# Patient Record
Sex: Male | Born: 1945 | Race: White | Hispanic: No | Marital: Married | Smoking: Former smoker
Health system: Southern US, Community
[De-identification: ages and names within clinical notes are randomized; demographics above are authoritative.]

## PROBLEM LIST (undated history)

## (undated) DIAGNOSIS — N200 Calculus of kidney: Secondary | ICD-10-CM

## (undated) DIAGNOSIS — J189 Pneumonia, unspecified organism: Secondary | ICD-10-CM

## (undated) DIAGNOSIS — I209 Angina pectoris, unspecified: Secondary | ICD-10-CM

## (undated) DIAGNOSIS — M069 Rheumatoid arthritis, unspecified: Secondary | ICD-10-CM

## (undated) DIAGNOSIS — K219 Gastro-esophageal reflux disease without esophagitis: Secondary | ICD-10-CM

## (undated) DIAGNOSIS — I219 Acute myocardial infarction, unspecified: Secondary | ICD-10-CM

## (undated) DIAGNOSIS — R011 Cardiac murmur, unspecified: Secondary | ICD-10-CM

## (undated) DIAGNOSIS — B376 Candidal endocarditis: Secondary | ICD-10-CM

## (undated) DIAGNOSIS — I82409 Acute embolism and thrombosis of unspecified deep veins of unspecified lower extremity: Secondary | ICD-10-CM

## (undated) DIAGNOSIS — E785 Hyperlipidemia, unspecified: Secondary | ICD-10-CM

## (undated) DIAGNOSIS — D649 Anemia, unspecified: Secondary | ICD-10-CM

## (undated) DIAGNOSIS — T300 Burn of unspecified body region, unspecified degree: Secondary | ICD-10-CM

## (undated) DIAGNOSIS — Z950 Presence of cardiac pacemaker: Secondary | ICD-10-CM

## (undated) DIAGNOSIS — I2699 Other pulmonary embolism without acute cor pulmonale: Secondary | ICD-10-CM

## (undated) DIAGNOSIS — J42 Unspecified chronic bronchitis: Secondary | ICD-10-CM

## (undated) DIAGNOSIS — I2510 Atherosclerotic heart disease of native coronary artery without angina pectoris: Secondary | ICD-10-CM

## (undated) DIAGNOSIS — I10 Essential (primary) hypertension: Secondary | ICD-10-CM

## (undated) DIAGNOSIS — K222 Esophageal obstruction: Secondary | ICD-10-CM

## (undated) DIAGNOSIS — K644 Residual hemorrhoidal skin tags: Secondary | ICD-10-CM

## (undated) DIAGNOSIS — I447 Left bundle-branch block, unspecified: Secondary | ICD-10-CM

## (undated) DIAGNOSIS — I509 Heart failure, unspecified: Secondary | ICD-10-CM

## (undated) HISTORY — PX: ESOPHAGEAL DILATION: SHX303

## (undated) HISTORY — DX: Heart failure, unspecified: I50.9

## (undated) HISTORY — DX: Gastro-esophageal reflux disease without esophagitis: K21.9

## (undated) HISTORY — DX: Left bundle-branch block, unspecified: I44.7

## (undated) HISTORY — DX: Cardiac murmur, unspecified: R01.1

## (undated) HISTORY — DX: Atherosclerotic heart disease of native coronary artery without angina pectoris: I25.10

## (undated) HISTORY — DX: Calculus of kidney: N20.0

## (undated) HISTORY — PX: INSERT / REPLACE / REMOVE PACEMAKER: SUR710

## (undated) HISTORY — PX: LITHOTRIPSY: SUR834

## (undated) HISTORY — DX: Hyperlipidemia, unspecified: E78.5

## (undated) HISTORY — DX: Burn of unspecified body region, unspecified degree: T30.0

---
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
## 1983-04-02 DIAGNOSIS — T300 Burn of unspecified body region, unspecified degree: Secondary | ICD-10-CM

## 1983-04-02 HISTORY — DX: Burn of unspecified body region, unspecified degree: T30.0

## 1998-05-18 ENCOUNTER — Encounter: Payer: Self-pay | Admitting: Family Medicine

## 1998-05-18 ENCOUNTER — Ambulatory Visit (HOSPITAL_COMMUNITY): Admission: RE | Admit: 1998-05-18 | Discharge: 1998-05-18 | Payer: Self-pay | Admitting: Family Medicine

## 1999-07-02 ENCOUNTER — Encounter (INDEPENDENT_AMBULATORY_CARE_PROVIDER_SITE_OTHER): Payer: Self-pay

## 1999-07-02 ENCOUNTER — Ambulatory Visit (HOSPITAL_COMMUNITY): Admission: RE | Admit: 1999-07-02 | Discharge: 1999-07-02 | Payer: Self-pay | Admitting: Gastroenterology

## 2000-03-17 ENCOUNTER — Emergency Department (HOSPITAL_COMMUNITY): Admission: EM | Admit: 2000-03-17 | Discharge: 2000-03-17 | Payer: Self-pay | Admitting: Emergency Medicine

## 2001-04-01 DIAGNOSIS — I219 Acute myocardial infarction, unspecified: Secondary | ICD-10-CM

## 2001-04-01 HISTORY — DX: Acute myocardial infarction, unspecified: I21.9

## 2001-04-01 HISTORY — PX: HAND RECONSTRUCTION: SHX1730

## 2001-04-01 HISTORY — PX: CORONARY ARTERY BYPASS GRAFT: SHX141

## 2001-05-02 HISTORY — PX: CARDIAC CATHETERIZATION: SHX172

## 2001-05-06 ENCOUNTER — Encounter: Payer: Self-pay | Admitting: Emergency Medicine

## 2001-05-06 ENCOUNTER — Inpatient Hospital Stay (HOSPITAL_COMMUNITY): Admission: EM | Admit: 2001-05-06 | Discharge: 2001-05-26 | Payer: Self-pay | Admitting: Emergency Medicine

## 2001-05-14 ENCOUNTER — Encounter: Payer: Self-pay | Admitting: *Deleted

## 2001-05-15 ENCOUNTER — Encounter: Payer: Self-pay | Admitting: Thoracic Surgery (Cardiothoracic Vascular Surgery)

## 2001-05-16 ENCOUNTER — Encounter: Payer: Self-pay | Admitting: Cardiovascular Disease

## 2001-05-18 ENCOUNTER — Encounter: Payer: Self-pay | Admitting: *Deleted

## 2001-05-21 ENCOUNTER — Encounter: Payer: Self-pay | Admitting: Thoracic Surgery (Cardiothoracic Vascular Surgery)

## 2001-05-22 ENCOUNTER — Encounter: Payer: Self-pay | Admitting: Thoracic Surgery (Cardiothoracic Vascular Surgery)

## 2001-05-23 ENCOUNTER — Encounter: Payer: Self-pay | Admitting: Thoracic Surgery (Cardiothoracic Vascular Surgery)

## 2001-05-24 ENCOUNTER — Encounter: Payer: Self-pay | Admitting: Cardiothoracic Surgery

## 2001-05-25 ENCOUNTER — Encounter: Payer: Self-pay | Admitting: Thoracic Surgery (Cardiothoracic Vascular Surgery)

## 2001-06-02 ENCOUNTER — Encounter: Payer: Self-pay | Admitting: *Deleted

## 2001-06-03 ENCOUNTER — Inpatient Hospital Stay (HOSPITAL_COMMUNITY): Admission: RE | Admit: 2001-06-03 | Discharge: 2001-06-17 | Payer: Self-pay | Admitting: Plastic Surgery

## 2001-06-12 ENCOUNTER — Encounter: Payer: Self-pay | Admitting: *Deleted

## 2002-11-09 ENCOUNTER — Ambulatory Visit (HOSPITAL_BASED_OUTPATIENT_CLINIC_OR_DEPARTMENT_OTHER): Admission: RE | Admit: 2002-11-09 | Discharge: 2002-11-09 | Payer: Self-pay | Admitting: Plastic Surgery

## 2007-04-02 HISTORY — PX: INSERT / REPLACE / REMOVE PACEMAKER: SUR710

## 2007-05-11 ENCOUNTER — Ambulatory Visit: Payer: Self-pay | Admitting: Internal Medicine

## 2007-05-11 LAB — CONVERTED CEMR LAB
Basophils Absolute: 0.1 10*3/uL (ref 0.0–0.1)
CO2: 30 meq/L (ref 19–32)
Chloride: 99 meq/L (ref 96–112)
Creatinine, Ser: 1.1 mg/dL (ref 0.4–1.5)
Eosinophils Absolute: 0.2 10*3/uL (ref 0.0–0.6)
Eosinophils Relative: 3.2 % (ref 0.0–5.0)
GFR calc Af Amer: 88 mL/min
HCT: 47.8 % (ref 39.0–52.0)
Hemoglobin: 15.8 g/dL (ref 13.0–17.0)
Lymphocytes Relative: 29.3 % (ref 12.0–46.0)
MCHC: 33.1 g/dL (ref 30.0–36.0)
MCV: 93.1 fL (ref 78.0–100.0)
Monocytes Absolute: 0.6 10*3/uL (ref 0.2–0.7)
Neutro Abs: 4.3 10*3/uL (ref 1.4–7.7)
Neutrophils Relative %: 58.1 % (ref 43.0–77.0)
RBC: 5.14 M/uL (ref 4.22–5.81)
WBC: 7.4 10*3/uL (ref 4.5–10.5)

## 2007-05-15 ENCOUNTER — Ambulatory Visit: Payer: Self-pay | Admitting: Internal Medicine

## 2007-05-15 ENCOUNTER — Observation Stay (HOSPITAL_COMMUNITY): Admission: AD | Admit: 2007-05-15 | Discharge: 2007-05-16 | Payer: Self-pay | Admitting: Internal Medicine

## 2007-06-04 ENCOUNTER — Ambulatory Visit: Payer: Self-pay

## 2007-07-02 ENCOUNTER — Ambulatory Visit: Payer: Self-pay | Admitting: Internal Medicine

## 2007-08-05 ENCOUNTER — Ambulatory Visit: Payer: Self-pay | Admitting: Internal Medicine

## 2008-08-08 ENCOUNTER — Encounter: Payer: Self-pay | Admitting: Cardiovascular Disease

## 2008-08-08 HISTORY — PX: US ECHOCARDIOGRAPHY: HXRAD669

## 2008-08-11 ENCOUNTER — Encounter: Payer: Self-pay | Admitting: Cardiovascular Disease

## 2008-08-11 HISTORY — PX: CARDIOVASCULAR STRESS TEST: SHX262

## 2008-08-30 ENCOUNTER — Encounter: Payer: Self-pay | Admitting: Internal Medicine

## 2008-10-24 ENCOUNTER — Ambulatory Visit (HOSPITAL_COMMUNITY): Admission: RE | Admit: 2008-10-24 | Discharge: 2008-10-25 | Payer: Self-pay | Admitting: Urology

## 2008-10-31 ENCOUNTER — Encounter: Payer: Self-pay | Admitting: Internal Medicine

## 2008-12-28 ENCOUNTER — Encounter: Payer: Self-pay | Admitting: Internal Medicine

## 2009-11-17 ENCOUNTER — Encounter (INDEPENDENT_AMBULATORY_CARE_PROVIDER_SITE_OTHER): Payer: Self-pay | Admitting: *Deleted

## 2009-12-11 ENCOUNTER — Ambulatory Visit: Payer: Self-pay | Admitting: Cardiovascular Disease

## 2010-03-12 ENCOUNTER — Encounter: Payer: Self-pay | Admitting: Internal Medicine

## 2010-03-20 DIAGNOSIS — I447 Left bundle-branch block, unspecified: Secondary | ICD-10-CM | POA: Insufficient documentation

## 2010-03-20 DIAGNOSIS — I5022 Chronic systolic (congestive) heart failure: Secondary | ICD-10-CM | POA: Insufficient documentation

## 2010-03-20 DIAGNOSIS — I501 Left ventricular failure: Secondary | ICD-10-CM | POA: Insufficient documentation

## 2010-03-21 ENCOUNTER — Ambulatory Visit: Payer: Self-pay | Admitting: Internal Medicine

## 2010-05-01 NOTE — Cardiovascular Report (Signed)
Summary: Certified Letter Signed - Patient  Certified Letter Signed - Patient   Imported By: Debby Freiberg 01/29/2010 15:39:47  _____________________________________________________________________  External Attachment:    Type:   Image     Comment:   External Document

## 2010-05-01 NOTE — Letter (Signed)
Summary: Device-Delinquent Check  [facility], Main Office  [address]   [place], [place] [address]   Phone: [phone]  Fax: [phone]     November 17, 2009 MRN: [id number]   [name] [address] [facility] [address] [place], [place]  [address]   Dear [name],  According to our records, you have not had your implanted device checked in the recommended period of time.  We are unable to determine appropriate device function without checking your device on a regular basis.  Please call our office to schedule an appointment, with Dr. Ladona Ridgel,  as soon as possible.  If you are having your device checked by another physician, please call us so that we may update our records.  Thank you,  Altha Harm, LPN  November 17, 2009 3:07 PM  [facility]  certified mail

## 2010-05-03 NOTE — Miscellaneous (Signed)
Summary: Device preload  Clinical Lists Changes  Observations: Added new observation of ICD INDICATN: CM (03/12/2010 11:24) Added new observation of ICDLEADSTAT3: active (03/12/2010 11:24) Added new observation of ICDLEADSER3: [id number] (03/12/2010 11:24) Added new observation of ICDLEADMOD3: 4194  (03/12/2010 11:24) Added new observation of ICDLEADDOI3: 05/15/2007  (03/12/2010 11:24) Added new observation of ICDLEADLOC3: LV  (03/12/2010 11:24) Added new observation of ICDLEADSTAT2: active  (03/12/2010 11:24) Added new observation of ICDLEADSER2: [id number]  (03/12/2010 11:24) Added new observation of ICDLEADMOD2: 7829  (03/12/2010 11:24) Added new observation of ICDLEADDOI2: 05/15/2007  (03/12/2010 11:24) Added new observation of ICDLEADLOC2: RV  (03/12/2010 11:24) Added new observation of ICDLEADSTAT1: active  (03/12/2010 11:24) Added new observation of ICDLEADSER1: [id number]  (03/12/2010 11:24) Added new observation of ICDLEADMOD1: 5076  (03/12/2010 11:24) Added new observation of ICDLEADDOI1: 05/15/2007  (03/12/2010 11:24) Added new observation of ICDLEADLOC1: RA  (03/12/2010 11:24) Added new observation of ICD IMP MD: Lewayne Bunting, MD  (03/12/2010 11:24) Added new observation of ICD IMPL DTE: 05/15/2007  (03/12/2010 11:24) Added new observation of ICD MODL#: 7299  (03/12/2010 11:24) Added new observation of ICD SERL#: [id number]  (03/12/2010 11:24) Added new observation of ICDMANUFACTR: Medtronic  (03/12/2010 11:24) Added new observation of IDC REFER MD: Chip Boer  (03/12/2010 11:24) Added new observation of ICD MD: Lewayne Bunting, MD  (03/12/2010 11:24)       ICD Specifications Following MD:  Lewayne Bunting, MD     Referring MD:  Chip Boer ICD Vendor:  Medtronic     ICD Model Number:  [id number]     ICD Serial Number:  [id number] ICD DOI:  05/15/2007     ICD Implanting MD:  Lewayne Bunting, MD  Lead 1:    Location: RA     DOI: 05/15/2007     Model #: [id number]     Serial  #: [id number]     Status: active Lead 2:    Location: RV     DOI: 05/15/2007     Model #: [id number]     Serial #: [id number]     Status: active Lead 3:    Location: LV     DOI: 05/15/2007     Model #: [id number]     Serial #: [id number]     Status: active  Indications::  CM

## 2010-05-03 NOTE — Assessment & Plan Note (Signed)
Summary: icd check/medtronic   History of Present Illness: Mr. Dishman returns today after a long absence from our EP clinic.  He is a pleasant 65 yo man with a h/o ICM, sp MI, CHF, LBBB, and CABG.  The patient has done well over the past year with no c/p, sob or peripheral edema.  He notes that back in the spring he had an MI and had CABG.  Since then he has done well and denies any ICD shocks.  Current Medications (verified): 1)  Metoprolol Succinate 25 Mg Xr24h-Tab (Metoprolol Succinate) .... Take One Tablet By Mouth Daily 2)  Nexium 40 Mg Cpdr (Esomeprazole Magnesium) .... Once Daily 3)  Aspirin Ec 325 Mg Tbec (Aspirin) .... Take One Tablet By Mouth Daily 4)  Flax   Oil (Flaxseed (Linseed)) .... Daily 5)  Multivitamins   Tabs (Multiple Vitamin) .... Daily  Allergies (verified): 1)  ! Morphine  Past History:  Past Medical History: Last updated: 03/20/2010  1. Ischemic cardiomyopathy with an ejection fraction of 30%.   2. Status post myocardial infarction March 2003.   3. Coronary artery disease.       a.     Severe 2-vessel coronary artery disease with class IV post-        infarction angina and status post acute anterior myocardial        infarction with left internal mammary artery to left anterior        descending, right internal mammary artery to radius intermediate        branch, saphenous vein graft to distal left circumflex coronary        artery, and a saphenous vein graft to distal right coronary artery        per Dr. Salvatore Decent. Cornelius Moras, February 2003.   Past Surgical History: Last updated: 03/20/2010 CABG BiV ICD Right percutaneous nephrostolithotomy (3.1 cm stone).  Right ureteropelvic junction stricture.  Remove K-pins left hand times two. Prepare recipient sites. Split thickness skin graft 300 cm squared. Full thickness skin graft to palm and first web space, left hand, greater  than 20 cm squared.  Review of Systems  The patient denies chest pain, syncope,  dyspnea on exertion, and peripheral edema.    Vital Signs:  Patient profile:   65 year old male Height:      70 inches Weight:      215 pounds BMI:     30.96 Pulse rate:   68 / minute BP sitting:   130 / 70  (left arm)  Vitals Entered By: Laurance Flatten CMA (March 21, 2010 2:12 PM)  Physical Exam  General:  Well developed, well nourished, in no acute distress.  HEENT: normal Neck: supple. No JVD. Carotids 2+ bilaterally no bruits Cor: RRR no rubs, gallops or murmur Lungs: CTA Ab: soft, nontender. nondistended. No HSM. Good bowel sounds Ext: warm. no cyanosis, clubbing or edema Neuro: alert and oriented. Grossly nonfocal. affect pleasant     ICD Specifications Following MD:  Lewayne Bunting, MD     Referring MD:  Chip Boer ICD Vendor:  Medtronic     ICD Model Number:  [id number]     ICD Serial Number:  [id number] ICD DOI:  05/15/2007     ICD Implanting MD:  Lewayne Bunting, MD  Lead 1:    Location: RA     DOI: 05/15/2007     Model #: 1191     Serial #: [id number]     Status: active Lead 2:  Location: RV     DOI: 05/15/2007     Model #: 1610     Serial #: [id number]     Status: active Lead 3:    Location: LV     DOI: 05/15/2007     Model #: 1191     Serial #: [id number]     Status: active  Indications::  CM   ICD Follow Up Remote Check?  No Battery Voltage:  2.95 V     Charge Time:  8.52 seconds       ICD Device Measurements Atrium:  Amplitude: 4.5 mV, Impedance: 616 ohms, Threshold: 1.0 V at 0.3 msec Right Ventricle:  Amplitude: 19.9 mV, Impedance: 560 ohms, Threshold: 1.0 V at 0.2 msec Left Ventricle:  Impedance: 1264 ohms, Threshold: 2.5 V at 1.0 msec Shock Impedance: 53/76 ohms   Episodes MS Episodes:  1     Shock:  0     ATP:  0     Nonsustained:  8     Atrial Pacing:  0.1%     Ventricular Pacing:  98.2%  Brady Parameters Mode DDD     Lower Rate Limit:  50     Upper Rate Limit 120 PAV 150     Sensed AV Delay:  120  Tachy Zones VF:  200     VT:  250     VT1:   176     Next Cardiology Appt Due:  05/31/2010 Tech Comments:  Checked by [employer].  No parameter changes.  Device function normal.  ROV 3 months clinic. Altha Harm, LPN  March 21, 2010 2:39 PM  MD Comments:  Normal device function.  Impression & Recommendations:  Problem # 1:  ICD - IN SITU (ICD-V45.02) His BiV ICD is working normally.  Will followup in several months.  Problem # 2:  CONGESTIVE HEART FAILURE, LEFT (ICD-428.1) He continues to be class 2.  He had a cough on ACE inhibitors.  Will defer additional medical theraph to Dr. Elease Hashimoto.  Problem # 3:  CARDIOMYOPATHY, ISCHEMIC (ICD-414.8) He is s/p CABG and denies any anginal symptoms. His updated medication list for this problem includes:    Metoprolol Succinate 25 Mg Xr24h-tab (Metoprolol succinate) .[name]... Take one tablet by mouth daily    Aspirin Ec 325 Mg Tbec (Aspirin) .[name]... Take one tablet by mouth daily  Patient Instructions: 1)  Your physician recommends that you schedule a follow-up appointment in: 3 months in the device clinic

## 2010-07-08 LAB — CBC
HCT: 48.2 % (ref 39.0–52.0)
Hemoglobin: 16.5 g/dL (ref 13.0–17.0)
MCHC: 34.2 g/dL (ref 30.0–36.0)
MCV: 92.3 fL (ref 78.0–100.0)
Platelets: 177 10*3/uL (ref 150–400)
RBC: 5.21 MIL/uL (ref 4.22–5.81)
RDW: 13.6 % (ref 11.5–15.5)
WBC: 6.6 10*3/uL (ref 4.0–10.5)

## 2010-07-08 LAB — BASIC METABOLIC PANEL
BUN: 9 mg/dL (ref 6–23)
CO2: 28 mEq/L (ref 19–32)
Calcium: 9.5 mg/dL (ref 8.4–10.5)
Chloride: 107 mEq/L (ref 96–112)
Creatinine, Ser: 1.04 mg/dL (ref 0.4–1.5)
GFR calc Af Amer: >60 mL/min (ref >60)
GFR calc non Af Amer: >60 mL/min (ref >60)
Glucose, Bld: 99 mg/dL (ref 70–99)
Potassium: 3.9 mEq/L (ref 3.5–5.1)
Sodium: 140 mEq/L (ref 135–145)

## 2010-07-09 ENCOUNTER — Encounter: Payer: Self-pay | Admitting: *Deleted

## 2010-07-10 ENCOUNTER — Ambulatory Visit (INDEPENDENT_AMBULATORY_CARE_PROVIDER_SITE_OTHER): Payer: Medicare Other | Admitting: Cardiovascular Disease

## 2010-07-10 ENCOUNTER — Encounter: Payer: Self-pay | Admitting: Cardiovascular Disease

## 2010-07-10 VITALS — BP 130/80 | HR 70 | Ht 69.0 in | Wt 209.0 lb

## 2010-07-10 DIAGNOSIS — E785 Hyperlipidemia, unspecified: Secondary | ICD-10-CM

## 2010-07-10 DIAGNOSIS — I2510 Atherosclerotic heart disease of native coronary artery without angina pectoris: Secondary | ICD-10-CM

## 2010-07-10 DIAGNOSIS — I501 Left ventricular failure: Secondary | ICD-10-CM

## 2010-07-10 NOTE — Assessment & Plan Note (Signed)
His heart failure symptoms seem to be fairly well controlled. He does have some fatigue. His ejection fraction was around 35%. He's on the low-dose of metoprolol. He does not tolerate angiotensin blockers or ACE inhibitors. At continue to try to convince him to eat less salt. He still eats a fair amount of fried foods. I have asked him to exercise on a regular basis.
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
## 2010-07-10 NOTE — Assessment & Plan Note (Signed)
He's not had any episodes of angina recently. He had a stress Myoview study in May 2010  which was negative for ischemia. His ejection fraction at that time was 34%.

## 2010-08-14 NOTE — Assessment & Plan Note (Signed)
[facility]                         ELECTROPHYSIOLOGY OFFICE NOTE   ARMAS, MCBEE                     MRN:          [id number]  DATE:08/05/2007                            DOB:          [birth year]    [name] returns today for followup.  He is a patient of Dr. Harvie Bridge  with a history of an ischemic cardiomyopathy and congestive heart  failure who is status post a BiV ICD insertion.  He returns today for  followup.  He has been gardening and overall feels well.  He denies  chest pain.  He initially had a small stitch abscess which has resolved  completely.   CURRENT MEDICATIONS:  1. His current medications include Nexium 40 a day.  2. Simvastatin 40 a day.  3. Aspirin 81 a day.  4. Carvedilol 3.125 twice daily.  5. Fish oil supplement.  6. He has not been able to take ramipril secondary to hypotension.   PHYSICAL EXAMINATION:  GENERAL:  On exam he is a pleasant, well-  appearing man in no acute distress.  VITAL SIGNS:  Blood pressure was 120/72, the pulse 72 and regular, the  respirations were 18.  NECK:  The neck revealed no jugular venous distention.  LUNGS:  Clear bilaterally to auscultation.  No wheezes, rales or rhonchi  are present.  CARDIOVASCULAR:  Regular rate and regular rhythm with normal S1 and S2.  The PMI was enlarged and laterally displaced.  There were no murmurs or  gallops.  EXTREMITIES:  Demonstrated no edema.   Interrogation of his defibrillator demonstrates a Office manager.  P and R waves were 4 and 20 respectively.  The impedance 616 in  the atrium, 512 in the ventricle, 1024 in the left ventricle.  Battery  voltage was 3.16 volts.  Today we turned his outputs down to 2 at 0.5 in  the atrium and 2.5 at 0.5 in the RV and 3.5 at 1 in the LV.  His LV  threshold was 2 volts at 1 millisecond.  There were no intercurrent IC  therapies.  His OptiVol fluid index was at baseline and his activity was  quite  good typically showing that he is active 6 hours a day or so.   IMPRESSION:  1. Ischemic cardiomyopathy.  2. Congestive heart failure.  3. Left bundle branch block.  4. Status post BiV ICD insertion.   DISCUSSION:  [name] is stable.  His fibrillation is working  normally.  We will see him back in the office in 9 months and thereafter  once a year.  He will follow up with Dr. Delane Ginger who is his primary  cardiologist.     [name]. Carlos Ridgel, MD  Electronically Signed    GWT/MedQ  DD: 08/05/2007  DT: 08/05/2007  Job #: [id number]   cc:   Durward Parcel, M.D.
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
## 2010-08-14 NOTE — Op Note (Signed)
[name], [name]            ACCOUNT NO.:  [id number]   MEDICAL RECORD NO.:  [id number]          PATIENT TYPE:  AMB   LOCATION:  DAY                          FACILITY:  [facility]   PHYSICIAN:  Mark C. Vernie Ammons, M.D.  DATE OF BIRTH:  05-13-1945   DATE OF PROCEDURE:  10/24/2008  DATE OF DISCHARGE:                               OPERATIVE REPORT   PREOPERATIVE DIAGNOSIS:  Right renal calculus.   POSTOPERATIVE DIAGNOSES:  1. Right renal calculus.  2. Right ureteropelvic junction stricture.   PROCEDURE:  1. Right percutaneous nephrostolithotomy (3.1 cm stone).  2. Right ureteropelvic junction stricture.   SURGEON:  Mark C. Vernie Ammons, M.D.   RADIOLOGISTSherrine Maples T. Fredia Sorrow, M.D.   ANESTHESIA:  General.   SPECIMENS:  Stone given to the patient.   DRAINS:  1. A 16-French Foley catheter in the bladder.  2. A 7-French 24 cm double-J stent in the right ureter.  3. A 5-French nephrostomy catheter as a nephrostomy tube.   ESTIMATED BLOOD LOSS:  Approximately 75 mL.   COMPLICATIONS:  None.   INDICATIONS:  The patient is a 65 year old male who had a right UPJ  repair as a child.  By his history it sounded like he had a crossing  vessel and recently he developed right flank pain and a CT scan revealed  a large stone in the renal pelvis with a second stone in the lower pole  collecting system.  We therefore discussed the treatment options and he  has elected to proceed with percutaneous nephrostolithotomy.  He had his  access placed by [facility] T. Fredia Sorrow, M.D.  in the radiology suite and at  that time, [name]. Fredia Sorrow, M.D. noted a narrowing at the UPJ, but was  able to get the guidewire down into the bladder.   DESCRIPTION OF OPERATION:  After informed consent, the patient was  brought to the major OR, placed on the table, administered general  anesthesia and then moved to the prone position.  His right flank, as  well as nephrostomy catheter was sterilely prepped and draped.  [name].  Fredia Sorrow, M.D. assisted by first passing the guidewire back down the  percutaneous access catheter into the bladder and then proceeded to  place a second guidewire, dilated the nephrostomy tract and passed a 30-  French nephrostomy sheath over the balloon into the area of the renal  pelvis.   The 28-French rigid nephroscope was then passed through the access  sheath into the area of the renal pelvis and I first was able to  identify the single stone in the lower pole calix and grasped this and  removed it with three-pronged graspers.  I then identified the stone in  the renal pelvis and used the Swiss lithoclast to fragment the stone.  As it fragmented portions of the fragmented stone were then extracted at  intervals as they fragmented using the three-pronged graspers.  Once all  of the stone had been extracted from the renal pelvic region fluoroscopy  was used to identify a single fragment that had migrated into the upper  pole.   I removed  the rigid nephroscope and inserted the flexible cystoscope and  was able to identify the stone in the upper pole and used a nitinol  basket to grasp the stone and extract it.  Reevaluation with fluoroscopy  revealed no further stones.  Reevaluation with the flexible cystoscope  and rigid nephroscope also revealed no further stones present.  There  was no evidence of injury or perforation of the renal pelvis or  collecting system.   [name]. Fredia Sorrow, M.D. then performed an antegrade pyelogram by  injecting full strength contrast through the nephrostomy sheath in the  area of the renal pelvis which allowed me to identify the location of  the narrowed ureteropelvic junction.  I then passed the 15-French  ureteral dilating balloon over the guidewire and dilated the narrowed  area without difficulty.  It yielded quite easily, indicating that there  was not a severe stricture or severe scarring in that location.  I then  left the guidewire in  place and initially passed the 7/14 [place], 24 cm  stent over the guidewire, but I was unable to pass this all the way into  the bladder despite multiple attempts.  I therefore removed that and  passed the 7-French, 24 cm double-J stent over the guidewire easily into  the bladder.  A good curl was then noted in the area of the renal pelvis  as the guidewire was removed and the second safety guidewire was then  used to pass the nephrostomy catheter down into the area of the bladder  and the guidewire was then removed.  Because there was no significant  bleeding it was felt a tubeless procedure should be performed, but I  wanted to maintain access by leaving the small nephrostomy catheter in  place until the morning.  The skin was then closed with interrupted 2-0  silk suture and the nephrostomy catheter was secured with this same silk  suture and the wound was dressed with sterile 4x4s and a Tegaderm.  The  patient was awakened and taken to recovery room in stable and  satisfactory condition.  He tolerated the procedure well with no  intraoperative complications.      Mark C. Vernie Ammons, M.D.  Electronically Signed     MCO/MEDQ  D:  10/24/2008  T:  10/24/2008  Job:  [id number]

## 2010-08-14 NOTE — Discharge Summary (Signed)
[name], Carlos            ACCOUNT NO.:  [id number]   MEDICAL RECORD NO.:  [id number]          PATIENT TYPE:  INP   LOCATION:  3711                         FACILITY:  [facility]   PHYSICIAN:  Bevelyn Buckles. Bensimhon, MDDATE OF BIRTH:  [birth year]   DATE OF ADMISSION:  05/15/2007  DATE OF DISCHARGE:  05/16/2007                               DISCHARGE SUMMARY   PRIMARY CARDIOLOGIST:  Doylene Canning. Ladona Ridgel, M.D.   PRIMARY CARE PHYSICIAN:  Vesta Mixer, M.D.   PROCEDURE PERFORMED DURING HOSPITALIZATION:  Insertion of BiV ICD  pacemaker per Dr. Doylene Canning. Ladona Ridgel.  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   FINAL DISCHARGE DIAGNOSES:  1. Ischemic cardiomyopathy with an ejection fraction of 30%.  2. Status post myocardial infarction March 2003.  3. Coronary artery disease.      a.     Severe 2-vessel coronary artery disease with class IV post-       infarction angina and status post acute anterior myocardial       infarction with left internal mammary artery to left anterior       descending, right internal mammary artery to radius intermediate       branch, saphenous vein graft to distal left circumflex coronary       artery, and a saphenous vein graft to distal right coronary artery       per Dr. Salvatore Decent. Cornelius Moras, February 2003.   HOSPITAL COURSE:  This is a 65 year old Caucasian male who was seen and  examined by Dr. Doylene Canning. Ladona Ridgel in his office on May 11, 2007,  secondary to moderate-to-severe LV dysfunction with history of large  anterior MI.  The patient had shortness of breath with exertion and  generalized fatigue, which was worse over the last several weeks prior  to his admission.  He was on maximal medical therapy.  The patient was  seen in Dr. Lubertha Basque office and was found to be of benefit from  biventricular ICD pacemaker implantation.  He discussed the risks and  benefits with the patient, who was willing to proceed.   The patient was admitted on  May 14, 2006, to have pacemaker  implantation completed.  This was completed by Dr. Ladona Ridgel using a  Medtronic InSync Sentry [phone] pacemaker.  The patient tolerated the  procedure well and remained in an arm sling for the first 24 hours.  The  patient was treated with IV antibiotics and painkillers along with  current medication regimen that he was taking at home.  The patient did  have some complaints of pain and insomnia, which he was treated for with  Vicodin and Ambien, which helped tremendously.  The patient was  energetic and ready to be discharged home on the day of discharge.  He  was seen and examined by Dr. Gala Romney and found to be stable with  followup appointments with Dr. Elease Hashimoto and Dr. Ladona Ridgel along with  pacemaker clinic on discharge.   DISCHARGE LABORATORIES:  Sodium 138, potassium 4.1, chloride 99, CO2 30,  BUN 13, creatinine 1.1, glucose 92.  Hemoglobin 15.8, hematocrit 47.8,  white  blood cell count 7.4, platelets 202.   DISCHARGE MEDICATIONS:  1. Ramipril 5 mg once a day.  2. Coreg 6.25 mg b.i.d.  3. Potassium 10 mEq daily.  4. Protonix 80 mg daily.  5. Lasix 40 mg daily.  6. Zocor 40 mg daily.  7. Aspirin 81 mg daily.  8. Zinc sulfate 220 mg daily.  9. Omega-3 acid daily.  10.Ambien 10 mg daily p.r.n. at h.s.  11.Vicodin 5/325 mg p.r.n. pain.   FOLLOWUP PLANS AND APPOINTMENTS:  1. The patient will follow up with primary care physician, Dr. Elease Hashimoto.      He is to see him next week in the office.  2. The patient is to follow up with the pacemaker clinic in 2 weeks      for post-pacemaker insertion site check and interrogation.  3. The patient is to follow up with Dr. Doylene Canning. Ladona Ridgel in 3 months      for continued cardiac management.  4. The patient has been given post-pacemaker insertion site      instructions with specific instructions concerning activities and      site evaluation.   Time spent with the patient to include physician time 40  minutes.      Bettey Mare. Lyman Bishop, NP      Bevelyn Buckles. Bensimhon, MD  Electronically Signed    KML/MEDQ  D:  05/16/2007  T:  05/18/2007  Job:  [id number]   cc:   Vesta Mixer, M.D.

## 2010-08-14 NOTE — Assessment & Plan Note (Signed)
[facility]                         ELECTROPHYSIOLOGY OFFICE NOTE   SMARAN, GAUS                     MRN:          [id number]  DATE:07/02/2007                            DOB:          May 20, 1945    [name] returns today for followup.  He is status post implantation  of a biventricular defibrillator several weeks ago.  He has been stable  except he noted some soreness over his ICD insertion site, and had a  small bit of draining in the last day or two, and is here for additional  evaluation.  He denies fevers or chills, and has otherwise felt well.   PHYSICAL EXAMINATION:  His blood pressure was 117/76 with a pulse 66 and  regular.  Respirations were 18.  The weight was 209 pounds.  NECK:  Revealed no jugular distention.  LUNGS:  Clear bilaterally to auscultation.  No wheezes, rales, or  rhonchi are present.  CARDIOVASCULAR EXAM:  Reveals a regular rate and rhythm.  Normal S1-S2.  EXTREMITIES:  Demonstrated no edema.   His ICD insertion site had a small stitch abscess that was approximately  2-3 mm in size where the skin edges were not together.  This area was  irrigated with hydrogen peroxide, today, and Iodine and the area probed.  A small piece of suture was removed.  There was no swelling in the  pocket, and otherwise his pocket was stable.  Of course his device was  interrogated, it is only 1-1/2 months post implant.   IMPRESSION:  1. Cardiomyopathy.  2. Congestive heart failure.  3. Status post biopsy implantable cardioverter-defibrillator      insertion.  4. Implantable cardioverter-defibrillator stitch abscess.   DISCUSSION:  Overall Mr. Math is stable.  I probed his wound, today,  as previously noted.  I have given him a prescription for some Keflex.  I have asked that he wash the area twice daily with Dial soap or other  antibacterial soap.  He is instructed to call us should he have any  additional symptoms, and then  we will see him back in several weeks.     Doylene Canning. Ladona Ridgel, MD  Electronically Signed    GWT/MedQ  DD: 07/02/2007  DT: 07/02/2007  Job #: [id number]   cc:   Vesta Mixer, M.D.

## 2010-08-14 NOTE — Op Note (Signed)
[name], [name]            ACCOUNT NO.:  [id number]   MEDICAL RECORD NO.:  [id number]          PATIENT TYPE:  INP   LOCATION:  2856                         FACILITY:  [facility]   PHYSICIAN:  Doylene Canning. Ladona Ridgel, MD    DATE OF BIRTH:  26-Aug-1945   DATE OF PROCEDURE:  05/15/2007  DATE OF DISCHARGE:                               OPERATIVE REPORT   PROCEDURE PERFORMED:  Implantation of a biventricular implantable  cardioverter-defibrillator.   INTRODUCTION:  The patient is a 65 year old male with an ischemic  cardiomyopathy, status post large anterior myocardial infarction, who  has a history of a bundle branch block (left bundle branch block) with a  QRS duration of 165 msec.  He is now referred for bi-V ICD insertion.   PROCEDURE:  After informed was obtained, the patient was taken to the  diagnostic EP lab in a fasting state.  Usual the usual preparation and  draping, intravenous fentanyl and midazolam were given for sedation.  Lidocaine 30 mL was infiltrated the left infraclavicular region.  A 7-cm  incision was carried out over this region and electrocautery was  utilized to dissect down to the fascial plane.  The left subclavian vein  was subsequently punctured x3 and a Medtronic model 6947 65-cm active-  fixation defibrillation lead, serial number [id number], was advanced into  the right ventricle.  The Medtronic model 5076 52-cm active-fixation  pacing lead, serial number [id number], was advanced into the right  atrium.  Mapping was carried out with the defibrillation lead in the  right ventricle.  At the final site on the RV septum the R waves  measured 17, the impedance was 900 ohms and the threshold 1.5 V at 0.5  msec.  Ten-volt pacing did not stimulate the diaphragm.  A large injury  current was present.  With the right ventricular lead in satisfactory  position, attention was then turned to placement of the atrial lead,  which was placed in the anterolateral portion the  right atrium, where  the P waves measured 4, pacing impedance was 1200 ohms, the threshold  0.8 V at 0.5 msec.  Ten-volt pacing did not stimulate the diaphragm.  With the atrial lead and ventricular leads in satisfactory position,  they were secured to the subpectoralis fascia with a figure-of-eight  silk suture.  Attention was then turned to placement of the LV lead.  The guiding catheter was advanced into the right atrium and the 6-French  hexapolar catheter was utilized to cannulate the CS.  The guiding  catheter was advanced into the CS over the EP catheter and venography of  the CS was carried out.  This demonstrated a nice lateral vein at a  position about 3 o'clock on the mitral valve annulus.  This was selected  for LV lead placement.  The Medtronic model 4194 88-cm passive-fixation  LV pacing lead, serial number [id number], was advanced through the  guiding catheter and out into the lateral vein without difficulty.  The  LV waves measured 8 mV, the pacing impedance was 1000 ohms, and the  pacing threshold 4 V at 0.4 msec.  Ten-volt pacing  did not stimulate the  diaphragm.  With these satisfactory parameters, the leads were secured  to the subpectoralis fascia with a figure-of-eight silk suture.  The  sewing sleeve was secured with silk suture as well.  Electrocautery was  utilized to make a subcutaneous pocket.  Kanamycin irrigation was  utilized to irrigate the pocket and electrocautery was utilized to  assure hemostasis.  The Medtronic InSync Sentry model [id number] bi-V ICD,  serial number [id number], was connected to the atrial, RV and LV leads  and placed back in the subcutaneous pocket.  The generator was secured  with silk suture.  At this point defibrillation threshold testing was  carried out.   After the patient was more deeply sedated with fentanyl and Versed, VF  was induced with a T-wave shock.  A 15-joule shock was delivered, which  terminated VF and restored sinus  rhythm.  At this point no additional  defibrillation threshold testing was carried out and the incision was  closed with a layer of 2-0 Vicryl, followed by a layer of 3-0 Vicryl.  Benzoin was painted on the skin, Steri-Strips were applied and a  pressure dressing was placed and the patient was returned to his room in  satisfactory condition.   COMPLICATIONS:  There were no immediate procedure complications.   RESULTS:  This demonstrates successful implantation of a Medtronic  biventricular ICD in a patient with an ischemic cardiomyopathy and class  III heart failure and left bundle branch block, EF 25%.      Doylene Canning. Ladona Ridgel, MD  Electronically Signed     GWT/MEDQ  D:  05/15/2007  T:  05/17/2007  Job:  [id number]   cc:   Vesta Mixer, M.D.

## 2010-08-14 NOTE — Letter (Signed)
May 11, 2007    Vesta Mixer, M.D.  [address]  [place], [place] [address]   RE:  MATS, JEANLOUIS  MRN:  [id number]  /  DOB:  20-Feb-1946   Dear Michele Mcalpine:   Thank you for referring Mr. Axzel Rockhill for EP evaluation and  consideration for prophylactic ICD implantation.  As you know, he is a  very pleasant middle-aged man with longstanding coronary disease status  post myocardial infarction and bypass grafting.  He has Class III heart  failure and left bundle branch block.  He has severe fatigue and  weakness though not much in the way of pulmonary congestion.  His EF was  30-35% and he has a QRS duration of 165-170 milliseconds with left  bundle branch block.  His examination was otherwise unremarkable except  for a soft S3 and a split S2.  I have discussed treatment options with  the patient and his wife today.  I have recommended proceeding with a  BiV ICD insertion.  This will be scheduled at the earliest possible  convenient time.  Thanks again for referring Mr. Revonda Standard for EP  evaluation.    Sincerely,      Doylene Canning. Ladona Ridgel, MD  Electronically Signed    GWT/MedQ  DD: 05/11/2007  DT: 05/12/2007  Job #: [id number]
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
## 2010-08-17 NOTE — Discharge Summary (Signed)
[place]. [facility]  Patient:    [name], [name] Visit Number: [id number] MRN: [id number]          Service Type: SUR Location: [id number] Attending Physician:  Chapman Moss Dictated by:   Teena Irani. Odis Luster, M.D. Admit Date:  06/03/2001 Discharge Date: 06/17/2001                             Discharge Summary  FINAL DIAGNOSIS:  Crush injury to left hand with complicated wounds of left hand, distal forearm and fractures first carpometacarpal joint left hand.  PROCEDURE PERFORMED:  He had a hand debridement on June 03, 2001, and then on June 15, 2001, he underwent preparation of sites with full thickness skin grafting to the hand and first web space and split thickness skin grafting to hand distal forearm and removal of K pins first CMC joint.  SUMMARY OF HISTORY AND PHYSICAL:  A 65 year old man who had a crush injury of his hand over six weeks ago.  His postoperative course after pinning the fractures and doing debridement and doing a skin graft, has been complicated by further loss of soft tissue as well as by myocardial infarction.  He then underwent a CABG procedure by Salvatore Decent. Cornelius Moras, M.D., and did well from that. He was subsequently discharged and then returned for further debridement after there was further declaration of soft tissue loss.  At that time, the wounds were noted to be fairly extensive and it was felt he should be admitted to the hospital.  For further details of admission history and physical, please see the chart.  HOSPITAL COURSE: ON the day of admission he had undergone debridement.  He was placed in a VAC sponge.  For a week or so, the VAC was continued and the wounds continued to progress.  He remained afebrile.  He was then placed on saline dressings for about three days and then taken back to surgery on June 15, 2001, for a preparation of sites and his grafting procedure.  He tolerated the procedure well. The  following day, the donor site was exposed and looked good.  The splint has been comfortable and is intact.  It is felt that he is ready to be discharged.  DISPOSITION:  He will be discharged on a regular diet.  He is to avoid any vigorous activities or lifting. Keep his left hand elevated at all times. Keep the dressing dry, no showers yet.  He will use a hair dryer four times a day to the donor site for 20 minutes at a time for another three or four days until it is dry.  DISCHARGE MEDICATIONS: 1. Keflex 250 mg p.o. q.i.d. for four days. 2. Xanax 0.25 mg, total of 15 given, one p.o. q.12h. p.r.n. for pain. 3. Mepergan Fortis, total of 40 given, one to two p.o. q.4h. p.r.n. for pain.  FOLLOW-UP:  We will see him back in the office in five days for recheck and takedown of his dressing. Dictated by:   Teena Irani. Odis Luster, M.D. Attending Physician:  Chapman Moss DD:  06/17/01 TD:  06/18/01 Job: [id number]

## 2010-08-17 NOTE — Op Note (Signed)
[place]  Patient:    [name], [name] Visit Number: [id number] MRN: [id number]          Service Type: MED Location: [phone] [id number] Attending Physician:  Tressie Stalker Dictated by:   Salvatore Decent. Cornelius Moras, M.D. Proc. Date: 05/22/01 Admit Date:  05/06/2001   CC:         Alvia Grove., M.D.  [place] Bouton, M.D.  Dr. Pilar Jarvis, [place], [place]  [facility]   Operative Report  PREOPERATIVE DIAGNOSIS:  1. 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  2. Status post acute anterior myocardial infarction.  POSTOPERATIVE DIAGNOSIS:  1. 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  2. Status post acute anterior myocardial infarction.  OPERATION/PROCEDURE:  Median sternotomy for coronary artery bypass graft x4 (left internal mammary artery to distal left anterior descending coronary artery, right internal mammary artery to ramus intermediate branch, saphenous vein graft to distal left circumflex coronary artery, and saphenous vein graft to distal right coronary artery).  SURGEON:  Salvatore Decent. Cornelius Moras, M.D.  ASSISTANT:  Artist Pais. Mina Marble, M.D.  ANESTHESIA:  General  BRIEF CLINICAL NOTE:  The patient is a 65 year old white male with no previous cardiac history, but risk factors including hypertension, hyperlipidemia, and tobacco abuse.  The patient was in his usual state of health until May 06, 2001, at which time, he sustained a serious injury to his left forearm while he was at work.  This consisted of getting his arm caught in a trash compactor with a serious crush injury to the left forearm.  He was admitted to the hospital and underwent surgical debridement and reconstruction by Dr. Shirlee Latch.  Later, during his hospitalization the patient suffered an acute myocardial infarction which may have been exacerbated by stress of his recent injury  and associated surgical procedures.  He underwent cardiac catheterization by Dr. Kristeen Miss.  This demonstrated severe, 2-vessel, coronary artery disease with critical stenosis involving the left anterior descending coronary artery.  For anatomical concerns, the patient was felt not to be a relatively good candidate for percutaneous based coronary intervention.  A full consultation note has been dictated previously.  The patient also has subsequently been diagnosed with left upper lobe pneumonia for which he has been treated medically.  OPERATIVE CONSENT:  The patient and his family have been counseled, at length, regarding the indications and potential benefits of coronary artery bypass grafting.  They understand and accept all associated risks of surgery; including, but not limited to, risks of death, stroke, myocardial infarction, bleeding requiring blood transfusion, arrhythmia, infection, and recurrent coronary artery disease.  All their questions have been addressed.  OPERATIVE NOTE DETAIL:  The patient was brought to the operating room on the above-mentioned date and invasive hemodynamic monitoring was established by the anesthesia service under the care and direction of Dr. Judie Petit. Specifically, a Swan-Ganz catheter was placed through the right internal jugular approach.  A right radial arterial line is placed.  Intravenous antibiotics are administered.  Following induction with general endotracheal anesthesia, the patients chest, abdomen, both groins, and both lower extremities are prepared and draped in a sterile manner.  A median sternotomy incision is performed in the left internal mammary artery. It is dissected from the chest wall and prepared for bypass grafting.  The left internal mammary artery is notably a good quality conduit.  Subsequently the right internal mammary artery is dissected from the chest wall and also  prepared for bypass grafting.  This  also is felt to be excellent quality conduit for bypass grafting.  Simultaneously a saphenous vein is obtained from the patients right thigh using endoscopic vein harvest technique.  The saphenous vein is felt to be good quality conduit.  The patient is heparinized systemically.  The pericardium is opened.  The ascending aorta is normal in appearance.  The ascending aorta and the right atrium are cannulated for cardiopulmonary bypass.  Adequate heparinization is verified.  Distal sites are selected for coronary bypass grafting.  Portions of saphenous vein in both internal mammary arteries are trimmed to appropriate length.  A temperature probe is placed in the left ventricular septum and a Styrofoam pad is placed to protect the left phrenic nerve from thermal injury.  A cardiopledgia catheter is placed in the ascending aorta.  Of note, there is dusky appearance of the distal anterior wall and apex of the left ventricle consistent with recent acute myocardial infarction.  The patient was cooled to 32 degrees systemic temperature.  The aortic cross clamp is applied and cardiopledgia delivered in an endograde fashion through the aortic root.  Iced saline slush was applied for topical hypothermia.  The initial cardiopledgia arrest and myocardial cooling are felt to be excellent. Repeat doses of cardiopledgia are administered intermittently throughout the cross clamp portion of the operation, both through the aortic root; and down subsequently placed vein grafts to maintain septal temperature below 15 degrees Centigrade.  The following distal coronary anastomoses are performed: 1. The distal left circumflex coronary artery is grasped with a saphenous vein    graft in an end-to-side fashion.  This coronary measures 1.0 mm in diameter     at the site of distal bypass and is of fair quality. 2. The distal right coronary artery is grasped with a saphenous vein graft in    an end-to-side  fashion.  This coronary measures 2.2 mm in diameter and is    of good quality. 3. The ramus intermediate branch is grafted with the right internal mammary    artery in an end-to-side fashion.  The right internal mammary artery    pedicle is positioned posteriorly through the transverse sinus.  The ramus    intermediate branch measures 2.0 mm in diameter and is of good quality at    the site of distal bypass.  This intermediate branch is notably a    bifurcating branch and the lateral branch is that which is grafted which is    also the largest of the two branches. 4. The distal left anterior descending coronary artery is grafted with a left    internal mammary artery in an end-to-side fashion.  This coronary measures    2.0 mm in diameter and is of good quality.  Both proximal saphenous vein    anastomoses are performed directly to the ascending aorta prior to removal    of the aortic cross clamp.  The septal temperature is noted to rise rapidly    and dramatically upon reperfusion of the internal mammary arteries.  All    air is evacuated from the aortic root.  The aortic cross clamp is removed    after a total cross clamp time of 83 minutes.  The heart begins to beat spontaneously without need for cardioversion.  All proximal and distal anastomoses are inspected for hemostasis and appropriate graft orientation.  The epicardial pacing wires are fixed from the right ventricular outflow tract into the right atrial appendage.  The  patient is weaned from cardiopulmonary bypass without difficulty.  The patients rhythm at separation from bypass is normal sinus rhythm.  No inotropic support is required to wean from bypass; although low-dose Dopamine infusion is begun following separation from bypass, due to relative vasodilation with relatively low blood pressure despite adequate filling pressures and excellent forward cardiac output.  Total cardiopulmonary bypass time for the operation is 111  minutes.  The venous and arterial cannulae are removed uneventfully.  Protamine is administered to reverse the anticoagulation.  The mediastinum in both left and right pleural spaces are irrigated with saline solution containing Vancomycin. Meticulous surgical hemostasis is ascertained.  The mediastinum and both left and right pleural spaces are drained with 4 chest tubes in place through separate stab incisions inferiorly.  The deep subcutaneous tissues of the right thigh are drained with a 19 [place] Blake drain.  The median sternotomy is closed in a routine fashion.  The two small incisions in the right thigh are closed in multiple layers in routine fashion.  All skin incisions are closed with subcuticular skin closures.  The patient tolerated the procedure well and is transported to the surgical intensive care unit in stable condition.  There are no intraoperative complications.  All sponge, instrument, and needle counts are verified correct at completion of the operation.  The patient was transfused 1 units of packed red blood cells after separation from bypass due to anemia.   Of note, the patient was noted to have anemia preoperatively. Dictated by:   Salvatore Decent Cornelius Moras, M.D. Attending Physician:  Tressie Stalker DD:  05/22/01 TD:  05/23/01 Job: [id number]

## 2010-08-17 NOTE — Op Note (Signed)
[place]. [facility]  Patient:    [name], [name] Visit Number: [id number] MRN: [id number]          Service Type: MED Location: [phone] Attending Physician:  Kendell Bane Dictated by:   Lowell Bouton, M.D. Proc. Date: 05/11/01 Admit Date:  05/06/2001   CC:         [name], M.D.   Operative Report  PREOPERATIVE DIAGNOSIS:  Status post crush injury left forearm and hand with fasciotomy volarly left forearm.  POSTOPERATIVE DIAGNOSIS:  Status post crush injury left forearm and hand with fasciotomy volarly left forearm.  OPERATION PERFORMED:  Split thickness skin graft from left thigh to left forearm.  SURGEON:  Lowell Bouton, M.D.  ANESTHESIA:  General.  OPERATIVE FINDINGS:  The patient had a granulating wound over the volar aspect of the left forearm that measured 5 x 12 cm.  A previous vacuum device had been applied and had reduced the wound down to the current size.  There was good granulation tissue although no evidence of ability to close the wound primarily.  The FCR tendon was exposed in the wound.  DESCRIPTION OF PROCEDURE:  Under general anesthesia, the left arm and left thigh were prepped and draped in the usual fashion.  The wound was irrigated copiously in the volar forearm.  A split thickness skin graft was taken using a Brown dermatome from the left thigh.  A 0.016 thickness was used and the graft was meshed with 1.5 x mesher.  The graft was applied to the volar aspect of the forearm and was kept in place with staples and 4-0 nylon sutures.  It completely covered the defect and was held in place with a cotton ball bolster using 4-0 nylon tied over the cotton balls.  Adaptic was applied under the cotton balls and sterile dressings were applied followed by a volar wrist splint.  The donor site was treated epinephrine to avoid bleeding and a scarlet red dressing.  The patient  tolerated the procedure well and went to the recovery room awake and stable in good condition. Dictated by:   Lowell Bouton, M.D. Attending Physician:  Kendell Bane DD:  05/11/01 TD:  05/11/01 Job: [id number]

## 2010-08-17 NOTE — Consult Note (Signed)
[place]. [facility]  Patient:    [name], [name] Visit Number: [id number] MRN: [id number]          Service Type: MED Location: [phone] Attending Physician:  Kendell Bane Dictated by:   Alvia Grove., M.D. Proc. Date: 05/13/01 Admit Date:  05/06/2001   CC:         Lowell Bouton, M.D.   Consultation Report  HISTORY OF PRESENT ILLNESS:  The patient is a 65 year old gentleman who was admitted on May 06, 2001, with a crush injury to his left hand. Since that time, he has had some intermittent episodes of chest pain. These episodes of chest pain have not been very severe. Last night around 3:00 a.m., he developed severe crushing chest pain. The pain was not sharp but was rather dull. He had significant dyspnea and diaphoresis when he got up go to the bathroom. All day long he has not felt right. Cardiac enzymes were drawn which revealed a markedly elevated CPK with a markedly elevated CPK-MB. His troponin was also elevated. We were asked to evaluate him for further recommendations.  The patient was still having some chest tightness when I saw him. This was very quickly relieved with sublingual nitroglycerin. The patient is currently pain free and is feeling much better.  CURRENT MEDICATIONS:  None.  ALLERGIES:  No known drug allergies.  PAST MEDICAL HISTORY: 1. Recent left hand injury. 2. Chest pain for the past week.  SOCIAL HISTORY:  The patient smokes 1 pack of cigarettes a day. He does not drink alcohol.  FAMILY HISTORY:  Positive for coronary artery disease on his fathers side of the family.  REVIEW OF SYSTEMS:  Was reviewed and is essentially negative.  PHYSICAL EXAMINATION:  GENERAL:  He is a middle-aged gentleman in no acute distress.  VITAL SIGNS:  Heart rate is 105, blood pressure 120/75.  HEENT:  Reveals 2+ carotids. He has no bruits. There is no JVD.  LUNGS:  Clear to  auscultation.  HEART:  Regular rate. S1, S2 and somewhat tachycardic. He has no murmur, rub or gallop.  ABDOMINAL:  Reveals good bowel sounds. It is nontender.  EXTREMITIES:  No clubbing, cyanosis, or edema. Pulses are intact. His left hand is bandaged.  NEUROLOGIC:  Nonfocal.  EKG reveals sinus tachycardia. There are no ST or T wave abnormalities.  Cardiac enzymes reveal a total CPK of 1,216 with a CK-MB of 197. His troponin is 4.43.  IMPRESSION/PLAN:  Chest pain. This is quite likely due to a subendocardial myocardial infarction or perhaps severe unstable angina. His EKG does not suggest a specific site. This could be a left circumflex marginal occlusion which is sometimes not seen easily by EKG. His pain has been completely relieved by sublingual nitroglycerin. We will transfer him to telemetry. We will start him on an intravenous nitroglycerin and intravenous heparin drip. We will anticipate heart catheterization tomorrow. We have discussed the risks, benefits, and options of heart catheterization. He understands and agrees to proceed. We will catheterize him sooner if he becomes unstable. All of his other medical problems including his hand problems appear to be fairly stable. Dictated by:   Alvia Grove., M.D. Attending Physician:  Kendell Bane DD:  05/13/01 TD:  05/13/01 Job: [id number]

## 2010-08-17 NOTE — Op Note (Signed)
[place]. [facility]  Patient:    [name], [name] Visit Number: [id number] MRN: [id number]          Service Type: MED Location: [phone] Attending Physician:  Kendell Bane Dictated by:   Lowell Bouton, M.D. Proc. Date: 05/20/01 Admit Date:  05/06/2001                             Operative Report  PREOPERATIVE DIAGNOSIS:  Status post crush injury left forearm with necrotic skin.  POSTOPERATIVE DIAGNOSIS:  Status post crush injury left forearm with necrotic skin.  OPERATION PERFORMED:  Debridement of necrotic skin with application of Oasis wound matrix.  SURGEON:  Lowell Bouton, M.D.  ANESTHESIA:  Axillary block.  OPERATIVE FINDINGS:  The patient had an area of necrotic skin over the dorsum of the hand and forearm that measured 18 x 7 cm.  There was also a necrotic area volarly distal to his previous skin graft that measured approximately 4 x 3 cm.  The skin graft appeared to have taken 100% volarly and staples and stitches were removed from that procedure.  DESCRIPTION OF PROCEDURE:  Under axillary block anesthesia, the left arm was prepped and draped in the usual fashion and a 10 blade was used to debride the necrotic skin after measuring the dorsal defect at 18 x 7 cm.  Sharp debridement was performed removing the dermis and any necrotic subcutaneous tissues.  There was good bleeding beneath that level and the wound was copiously irrigated.  An ABD pad was placed with pressure on the wound and then the volar wound was addressed.  The staples were removed from around the skin graft which was a meshed split thickness skin graft.  It had taken 100%. There was an area distal to it that was necrotic along with the incision in the palm.  This area was debrided and was much smaller than the dorsal wound. After debriding that down to good viable tissue, the wounds were copiously irrigated with saline and two  sheets of Oasis wound matrix was applied. Mepitel dressing was applied over the wound matrix followed by Adaptic and sterile dressings.  A 4 inch Ace bandage was applied.  The patient tolerated the procedure well and went to the recovery room awake and stable in good condition. Dictated by:   Lowell Bouton, M.D. Attending Physician:  Kendell Bane DD:  05/20/01 TD:  05/20/01 Job: [id number]

## 2010-08-17 NOTE — Op Note (Signed)
[name]. [facility]  Patient:    [name], [name] Visit Number: [id number] MRN: [id number]          Service Type: MED Location: [id number] Attending Physician:  Kendell Bane Dictated by:   Lowell Bouton, M.D. Proc. Date: 05/06/01 Admit Date:  05/06/2001   CC:         [name], M.D.                           Operative Report  PREOPERATIVE DIAGNOSIS:  Crush injury, left hand and forearm with lacerated flexor carpi radialis and loss of circulation to the left thumb.  POSTOPERATIVE DIAGNOSIS:  Crush injury, left hand and forearm with lacerated flexor carpi radialis and loss of circulation to the left thumb with trapeziotrapezoid dislocation and clotting with spasm of princeps pollicis artery with laceration of the flexor digitorum superficialis of index, middle and ring fingers, left hand and forearm.  OPERATION PERFORMED: 1. Volar left forearm fasciotomy. 2. Sympathectomy of radial artery and princeps pollicis artery. 3. Repair of flexor carpi radialis tendon and flexor digitorum superficialis    tendon to the index, middle and ring fingers. 4. Open reduction and pinning of first carpometacarpal joint and second    carpometacarpal joint, left wrist.  SURGEON:  Lowell Bouton, M.D.  ASSISTANT:  Artist Pais. Mina Marble, M.D.  ANESTHESIA:  General.  OPERATIVE FINDINGS:  The patient had a significant crushing injury from the proximal forearm out to the hand.  The flexor carpi radialis tendon was avulsed from distal to proximal on its insertion and was found in the midcarpus.  The superficialis tendons of the index, middle and ring fingers were likewise avulsed and were proximal in the forearm.  The thenar musculature was totally off of the second metacarpal attachment and also transected in the palm.  The princeps pollicis artery had a clot and spasm over the base of the first metacarpal.  The median nerve  was fairly contused but was intact as was the ulnar nerve.  The ulnar artery was intact.  The transverse carpal ligament was avulsed from its attachment on the pisiforme from ulnar to radial.  The trapezoid joint was dislocated as was the carpal metacarpal joint at the thumb.  The volar wrist ligaments appeared to be intact.  There was a crushing type of fracture of the distal ulna that did not require repair.  DESCRIPTION OF PROCEDURE:  Under general anesthesia, a tourniquet was applied to the left arm and after removing the bandage, the hand was elevated and the tourniquet was inflated to 275 mmHg.  There was significant bleeding from the wound requiring the tourniquet to be inflated for the sterile prep and drape. After prepping with Betadine, there was an open wound over the volar ulnar aspect of the wrist that extended into the hypothenar fat.  There were also two dorsal wounds, one over the thumb carpometacarpal and one over the dorsum of the forearm.  There was a large wound in the first webspace between the thumb and index finger palmarly.  The laceration in the palm and volar aspect of the wrist was extended distally along just ulnar to the thenar crease in the palm and sharp dissection was carried through the subcutaneous tissues. Bleeding points were coagulated.  The carpal canal did not need to be released as it was completely avulsed from the attachment at the pisiforme.  The median nerve was examined in  the carpal canal and was found to be stretched but intact.  The laceration was then extended proximally up to the proximal forearm along the flexor carpi radialis tendon sheath and this was opened from distal to proximal.  The tendon was found to be curled up in the forearm and had been avulsed from its attachment at the scaphoid tuberosity.  The forearm compartment was released of its muscle fascia.  The radial artery was then identified and was traced from proximal to distal  and a sympathectomy was performed volarly at the wrist.  It was then traced around the base of the thumb to the dorsum of the first web space and at that point, the princeps pollicis branch was identified.  It appeared to be intact; however, it was in spasm and clotted.  ____________ solution was used to relieve it and after reducing the thumb CMC joint, it appeared as though some circulation returned to the thumb.  The microscope was then brought in and a sympathectomy was performed from the radial artery out to the princeps pollicis and along the first metacarpal out to the MP joint.  This allowed good perfusion of the thumb.  The tourniquet had been released to identify bleeding and a large branch of the dorsal radial artery was tied off with a hemoclip.  At this point the microscope was removed and the volar aspect of the thumb was explored and was found to have the digital nerves intact.  The dorsum of the thumb laceration had been extended to expose the princeps pollicis.  The thenar musculature was reattached loosely with 4-0 Vicryl suture and was significantly shredded and injured.  Attention was then focused on the volar forearm and wrist and the carpus was examined from the volar aspect.  It was noted that the trapezium and trapezoid were dislocated from the first and second metacarpal and these were reduced and pinned with a 4-5 K-wire.  Under C-arm control, x-ray showed good alignment of the joint.  The K-wires were bent over and left protruding from the skin.  In the volar aspect of the forearm, the flexor tendons were identified and the superficialis of the index, middle and ring fingers were repaired using 3-0 Ethibond Kessler type sutures, reinforced with figure-of-eight sutures.  The flexor carpi radialis could not be reattached to bone and so was repaired under tension to the radial attachment of the transverse carpal ligament.  The flexor carpi ulnaris was intact and the  strand of the palmaris longus tendon was intact.  The wounds were then copiously irrigated and closed with 4-0 nylon sutures in the  hand.  There was also found to be a laceration in the second/third web that was repaired with 4-0  nylon.  The carpal tunnel release incision was repaired with 4-0 nylon as was the hypothenar laceration.  The volar forearm wound was fairly tense and was left open.  Sterile dressings were applied followed by a dorsal protective splint with the wrist in slight flexion to protect the tendon repairs.  There was good circulation to the hand.  The patient went to the recovery room awake and stable in good condition. Dictated by:   Lowell Bouton, M.D. Attending Physician:  Kendell Bane DD:  05/06/01 TD:  05/07/01 Job: [id number]

## 2010-08-17 NOTE — Op Note (Signed)
[place]. [facility]  Patient:    [name], [name] Visit Number: [id number] MRN: [id number]          Service Type: SUR Location: [phone] Attending Physician:  Kendell Bane Dictated by:   Teena Irani. Odis Luster, M.D. Proc. Date: 06/15/01 Admit Date:  06/03/2001                             Operative Report  PREOPERATIVE DIAGNOSIS:  Crush injury to the left arm and complicated open wounds left palm, first web space, left hand and forearm, volar and dorsal.  POSTOPERATIVE DIAGNOSIS:  Crush injury to the left arm and complicated open wounds left palm, first web space, left hand and forearm, volar and dorsal.  OPERATION PERFORMED: 1. Remove K-pins left hand times two. 2. Prepare recipient sites. 3. Split thickness skin graft 300 cm squared. 4. Full thickness skin graft to palm and first web space, left hand, greater    than 20 cm squared. 5. Application of a short arm splint.  SURGEON:  Teena Irani. Odis Luster, M.D.  ANESTHESIA:  General.  INDICATIONS FOR PROCEDURE:  The patient is a 65 year old with significant injuries to his left arm after crush injuries only six weeks ago.  He has had a very complicated course with several surgeries and he has had a complicated course medically with myocardial infarction and has required a coronary artery bypass grafting to be performed.  He now presents for further management of his soft tissues wound, left hand.  The return of function is very questionable.  Lowell Bouton, M.D. requested plastic surgery consultation for treatment of the significant soft tissue injuries.  The nature of the procedure and the risks, possible complications were discussed with him in great detail.  He understood these risks and the consent form was discussed with him line by line.  The risks include and possible complications include bleeding, infection, anesthesia related complications, wound-healing problems, loss  of the grafts, cosmetic deformities, contour deformities, possibility of further surgeries, damaged tendons, possibility of damage to the median nerve and chronic pain and a very, very limited return of function most likely.  He understood all of these risks and the possible complications as well as the risks of anesthesia and wished to proceed.  DESCRIPTION OF PROCEDURE:  The patient was seen in the operating room and placed supine.  After successful administration of general anesthesia, he was prepped with Betadine and draped in sterile drapes.  The left arm was approached first debriding the necrotic tendon and debriding all areas.  Care was taken to avoid damage to his median nerve.  The median nerve appeared to be covered with some tendon.  The wounds were then irrigated with a pulse irrigation system.  Total of 3L.  Good hemostasis was noted.  The wounds were covered with saline moistened laps.  Attention was then directed to the donor sites.  Full thickness skin graft was harvested from the left inguinal crease. Meticulous hemostasis electrocautery.  That graft was wrapped in saline moistened gauze.  The closure of the donor defect with 2-0 and 3-0 Vicryl suture interrupted inverted deep sutures and 3-0 Vicryl suture running subcuticular suture as needed.  Steri-Strips applied.  Split thickness skin graft was harvested at 0.016 or 0.017 of an inch.  This was meshed 1.5 to 1. The full thickness skin graft was applied over the median nerve area as well as in the first web  space.  The K-pins had already been removed at this point taking great care to just back them out directly as they had been placed.  The split thickness skin grafts were then positioned and all were secured with skin staples.  Xeroform gauze and a dry sterile dressing were then placed over this and [place] foam with the adhesive side away from the skin graft was then applied over all as a compression.  He was then  wrapped with an Ace wrap beginning out distally and proceeding more proximal.  The donor site was dressed with scarlet red, Adaptic, 4 x 4s, ABDs.  He was then placed in a short arm splint extending this out to the digits including the digits as much in a position of function as possible with the MPs flexed and the IPs extended.  Due to some joint contracture, it was possible to place him in a full safe position.  The wrist was dorsiflexed and some plaster was placed at the thumb site, well padded with of course, some Webril in order to hold the thumb still for a few days.  He was then transferred to the recovery room in stable condition having tolerated the procedure well. Dictated by:   Teena Irani. Odis Luster, M.D. Attending Physician:  Kendell Bane DD:  06/15/01 TD:  06/16/01 Job: [id number]

## 2010-08-17 NOTE — Consult Note (Signed)
. [facility]  Patient:    [name], [name] Visit Number: [id number] MRN: [id number]          Service Type: DSU Location: [id number] Attending Physician:  Kendell Bane Dictated by:   Teena Irani. Odis Luster, M.D. Proc. Date: 06/03/01 Admit Date:  06/03/2001   CC:         Lowell Bouton, M.D.   Consultation Report  REQUESTING PHYSICIAN:  Lowell Bouton, M.D.  CHIEF COMPLAINT:  Complicated open wounds of the left arm.  HISTORY OF PRESENT ILLNESS:  A 65 year old man with complicated open wounds of his left arm secondary to a crush injury. This occurred over a month ago. He has undergone some extensive orthopedic procedures including surgery for his first [facility] joint fracture and tendon injuries. Unfortunately, he had a myocardial infarction while he was in the hospital and underwent a CABG procedure. He is now an outpatient and has done fairly well from all of this, and is presenting today for a stage procedure for another debridement of his arm and hand. These wounds are now very extensive. They involve the first web space and a plastic consultation was requested for evaluation. For further details please see the old record.  PHYSICAL EXAMINATION IN THE OPERATING ROOM:  The hand is examined, and he does have extensive wounds in the first web space of the left hand extending up the dorsum of the arm, forearm, and then extending into the palm space over the carpal tunnel where he was released, with a small area of exposed median nerve down very deep. He also has exposed tendon on the volar aspect as well. These wounds have been debrided in the operating room and are clean. The tendons appear to be somewhat desiccated and very likely will not be functional nor viable.  RECOMMENDATION:  Would recommend using the VAC sponge for promotion of granulation tissue. His tendons and median nerve could be covered with  some Adaptic, and the VAC sponge will hopefully promote some granulation so that we can get these wounds ready for reconstruction. The question comes up whether this could be done as an outpatient or not, and it seems like that will be difficult and he probably will need to be an inpatient for this. We will plan on reassessing these wounds in about five days to see if they are progressing, and in the meantime he will continue the [facility] here in the hospital.  I appreciate the opportunity of seeing this pleasant patient and participating in his care. Dictated by:   Teena Irani. Odis Luster, M.D. Attending Physician:  Kendell Bane DD:  06/03/01 TD:  06/03/01 Job: [id number]

## 2010-08-17 NOTE — Consult Note (Signed)
[place]. [facility]  Patient:    [name], [name] Visit Number: [id number] MRN: [id number]          Service Type: MED Location: [phone] Attending Physician:  Kendell Bane Dictated by:   Teena Irani. Odis Luster, M.D. Proc. Date: 05/19/01 Admit Date:  05/06/2001   CC:         Lowell Bouton, M.D.   Consultation Report  CHIEF COMPLAINT:  A crush injury, left arm, with significant soft tissue deficit.  HISTORY OF PRESENT ILLNESS:  A 65 year old man who was involved in an accident almost two weeks ago.  He suffered a significant crush injury to his left hand and arm when it was caught in an Psychologist, forensic at work.  He had diminished circulation.  He underwent a volar left forearm fasciotomy with a sympathectomy of the princeps pollicis radial artery, repair of the FCR tendon, and some of the FDS tendon.  Also, an open reduction and pinning of the first [facility] and second CMC joint of the left wrist.  He tolerated that procedure well.  He subsequently had some demarcation of the crush wound on the volar aspect of the arm which was debrided and skin grafted.  He continued to do fairly well, although he had developed some chest pain, which on further questioning he had had for some months.  He, unfortunately, experienced a cardiac event and underwent a catheterization, which showed significant three-vessel disease.  He has had some further demarcation of his crush injury to his left arm and he now is requiring a CABG and a further debridement of the left arm and a plastic surgery consultation was requested for another opinion regarding this.  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  Negative for cardiac, GI, pulmonary disease, diabetes mellitus, or hypertension.  REVIEW OF SYSTEMS:  Essentially negative except as above.  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  He smokes one pack of cigarettes a day.  He is married and lives locally.  PHYSICAL EXAMINATION:  EXTREMITIES:  Physical examination is limited to the extremity.  The left digits do appear to be perfused.  Nail beds are pink.  He had some limited range of motion of the digits.  There is some edema.  He has a skin graft, volar aspect, which is viable and it is over some tendons.  There is a large amount of eschar both volar and dorsal that is almost circumferential that appears to be fairly well demarcated at the present time.  There is no evidence of any abscess or purulence associated with this eschar.  RECOMMENDATIONS:  Agree with proceeding with the debridement tomorrow, although I would avoid a general anesthetic.  We will proceed with debridement under a sedation or preferably a block.  The wound could be covered with a bioengineered skin such as Oasis or even xenograft.  If xenograft is used, I would change in 3-4 days.  Agree that the CABG procedure takes priority and the skin grafting reconstruction can certainly be delayed.  I would be happy to be of further assistance if needed. Dictated by:   Teena Irani. Odis Luster, M.D. Attending Physician:  Kendell Bane DD:  05/19/01 TD:  05/20/01 Job: [id number]

## 2010-08-17 NOTE — Op Note (Signed)
[place]. [facility]  Patient:    [name], [name] Visit Number: [id number] MRN: [id number]          Service Type: MED Location: [phone] Attending Physician:  Kendell Bane Dictated by:   Artist Pais Mina Marble, M.D. Proc. Date: 05/08/01 Admit Date:  05/06/2001                             Operative Report  PREOPERATIVE DIAGNOSIS: Crush injury of left upper extremity with open wounds, status post fasciectomy.  POSTOPERATIVE DIAGNOSIS: Crush injury to left upper extremity with open wounds, status post fasciectomy.  OPERATION/PROCEDURE:  1. Incision and drainage of left forearm.  2. Dressing change.  3. Application of V.A.C. (vacuum-assisted closure) device.  SURGEON: Artist Pais. Mina Marble, M.D.  ASSISTANT: Junius Roads. [place], P.A.C.  ANESTHESIA: General.  TOURNIQUET TIME: None.  COMPLICATIONS: None.  DRAINS: None.  One V.A.C. device left on the volar aspect from forearm fasciotomy scar.  DESCRIPTION OF PROCEDURE: The patient was taken to the operating room and after induction of adequate general anesthesia the left upper extremity was prepped and draped in the usual sterile fashion.  Once this was done large wounds over the volar aspect of the distal forearm and mid forearm area, as well as volarly and dorsally, were irrigated copiously with three liters of normal saline.  All necrotic tissue was debrided down to healthy edges.  At this point in time, a small wound at the distal wrist crease was closed with 4-0 nylon, thus covering up the flexor carpi ulnaris tendon and ulnar vascular bundle.  At this point in time, the volar forearm wound was covered with the V.A.C. (vacuum-assisted closure) device in the usual sterile technique fashion and the hand was then dressed with Xeroform, 4 x 4s, fluffs, a compressive dressing, a splint with the wrist in slight flexion, the MP and PIP joints in slight flexion, and Kerlix and Ace wraps on  top.  The patient tolerated the procedure well and went to recovery in stable fashion. Dictated by:   Artist Pais Mina Marble, M.D. Attending Physician:  Kendell Bane DD:  05/08/01 TD:  05/09/01 Job: [id number]

## 2010-08-17 NOTE — Op Note (Signed)
NAME:  [name], [name]                      ACCOUNT NO.:  [id number]   MEDICAL RECORD NO.:  [id number]                   PATIENT TYPE:  AMB   LOCATION:  DSC                                  FACILITY:  [facility]   PHYSICIAN:  Etter Sjogren, M.D.                  DATE OF BIRTH:  22-Oct-1945   DATE OF PROCEDURE:  11/09/2002  DATE OF DISCHARGE:                                 OPERATIVE REPORT   PREOPERATIVE DIAGNOSIS:  Complicated open wound of the left hand and wrist  secondary to possible foreign body granuloma.   POSTOPERATIVE DIAGNOSIS:  Complicated open wound of the left hand and wrist  secondary to possible foreign body granuloma.   OPERATION PERFORMED:  1. Complicated removal of foreign body, left hand.  2. Complex wound closure, left hand, 1 gm.   SURGEON:  Etter Sjogren, M.D.   ANESTHESIA:  1% Xylocaine with epinephrine plus bicarb.   INDICATIONS FOR PROCEDURE:  This 65 year old man had a very severe injury to  his left hand.  He had multiple surgeries by his orthopedic surgeon and then  coverage procedure with skin grafts. He has done fairly well from all of  this but then about six months ago developed a wound on his left hand at the  hand wrist area that has failed conservative management with limited  debridement and dressing changes.  It is felt that this possibly represents  a foreign body in the tendon, possibly a suture and an exploration is  warranted.  This was discussed with him in detail including the possibility  of still continued failure of wound healing, damage to tendon and he wished  to proceed.   DESCRIPTION OF PROCEDURE:  The patient was taken to the operating room and  placed supine.  He was prepped with Betadine and draped with sterile drapes.  Satisfactory local anesthesia achieved using 1% Xylocaine with epinephrine  plus some bicarb.  Elliptical excision of the friable overlying tissue was  then performed and the old granulation tissue removed.   Careful inspection  did reveal a suture in the tendon.  This was excised.  The tendon was noted  to still be functional.  A careful exploration of the wound failed to reveal  any other suture material.  Thorough irrigation and excellent hemostasis  having been confirmed, the closure with 4-0 nylon simple interrupted sutures  advancing these wound edges together.  This was a difficult wound closure  since the area was rather tight but ultimately the wound appeared to close  without tension.   DISPOSITION:  He may shower in two days.  He is to keep his arm elevated.  Percocet 5 mg, total 12, given one by mouth every four hours as needed for  pain.  Recheck in the office in two weeks or sooner if there are questions  or concerns.  He understands that there may be wound healing problems with  this and we may need to remove the sutures and allow the wound to heal  secondarily.  It should be noted that the foreign body has been removed.                                               Etter Sjogren, M.D.   DB/MEDQ  D:  11/09/2002  T:  11/09/2002  Job:  [id number]

## 2010-08-17 NOTE — Cardiovascular Report (Signed)
[place]. [facility]  Patient:    [name], [name] Visit Number: [id number] MRN: [id number]          Service Type: MED Location: [phone] Attending Physician:  Kendell Bane Dictated by:   Alvia Grove., M.D. Proc. Date: 05/14/01 Admit Date:  05/06/2001   CC:         Lowell Bouton, M.D.  CVTS Office  Cardiac Catheterization Lab   Cardiac Catheterization  INDICATIONS: The patient is a 65 year old gentleman, with a recent crush injury to his left hand. He was in the hospital recuperating from surgery and developed severe substernal chest pain one morning. Subsequent cardiac enzymes revealed markedly increased CPK and CPK MB. The pain was relieved with sublingual nitroglycerin. He was referred to the catheterization lab for further evaluation.  PROCEDURE: Left heart catheterization with coronary angiography and left ventriculography.  The right femoral artery was easily cannulated using a modified Seldinger technique.  HEMODYNAMICS: The left ventricular pressure is 100/33 with an aortic pressure of 100/70. His heart rate was 110.  ANGIOGRAPHY: 1. The left main coronary artery has a very mild taper proximally, but is    overall fairly smooth and normal. 2. The left anterior descending artery has a ruptured plaque in the proximal    aspect of the vessel. This represents approximately 95-99%. This    ruptured plaque is just prior to the takeoff of the first diagonal    vessel. The remainder of the LAD has no significant irregularities. 3. The ramus intermediate branch is a small to moderate sized vessel.    It has a ruptured plaque associated with a 95% stenosis. 4. The left circumflex artery is a moderate sized vessel. It has a 95%    stenosis in the proximal segment. 5. The right coronary artery is large and dominant. It supplies collaterals    up through the septal branches to the LAD and diagonal system.  There are    diffuse irregularities throughout the course of the right coronary artery    between 20-30%. There is a discrete stenosis in the mid lesion of    approximately 40-50%.  LEFT VENTRICULOGRAM: The left ventriculogram was performed in a 30 RAO position. It revealed moderately depressed left ventricular systolic function with an ejection fraction of around 40-45%. There is moderate hypokinesis involving the mid anterior wall and mid inferior wall. The apex still seems to contract fairly well. There is no mitral regurgitation.  COMPLICATIONS: None.  CONCLUSIONS: Significant and severe coronary artery disease involving the left anterior descending, ramus intermediate branch, and left circumflex artery. He has moderate disease involving the right coronary artery. All of his significant stenosis appear to be very ulcerated plaque ruptures and appear to be somewhat high risk for angioplasty. I feel that the best option for him would be to refer him for coronary artery bypass grafting. Even though he has only a 40-50% stenosis in his right coronary artery, this plaque appears to be somewhat friable appears to be prone to rupture. I would recommend bypass grafting to this vessel as well.  It supplies significant collateral circulation to the left system.  He has mild to moderate left ventricular systolic dysfunction. Dictated by:   Alvia Grove., M.D. Attending Physician:  Kendell Bane DD:  05/14/01 TD:  05/14/01 Job: [id number]

## 2010-08-17 NOTE — Op Note (Signed)
[place]. [facility]  Patient:    [name], [name] Visit Number: [id number] MRN: [id number]          Service Type: DSU Location: [phone] Attending Physician:  Kendell Bane Dictated by:   Lowell Bouton, M.D. Proc. Date: 06/03/01 Admit Date:  06/03/2001                             Operative Report  PREOPERATIVE DIAGNOSIS:  Status post crush injury, left forearm and hand with further necrosis of skin.  POSTOPERATIVE DIAGNOSIS:  Status post crush injury, left forearm and hand with further necrosis of skin.  OPERATION PERFORMED:  Debridement of necrotic tissue, left forearm and hand.  SURGEON:  Lowell Bouton, M.D.  ANESTHESIA:  General.  OPERATIVE FINDINGS:  The patient had necrosis of the dorsal flap in the first web space in his hand that extended around the volar aspect of the thumb.  He also had necrotic tissue in the volar aspect of his wrist with a large bone fragment in the subcutaneous tissues volarly.  After removing that fragment, the median nerve was exposed and the flexor carpi radialis and flexor carpi ulnaris tendons were exposed in the wound volarly.  There was some necrotic tissue on the dorsum of the hand and forearm also.  The area of the oasis dressing dorsally looked good without any sign of infection.  DESCRIPTION OF PROCEDURE:  Under general anesthesia, the left hand and forearm were prepped and draped in the usual fashion.  A 15 blade was used to debride all the necrotic tissue on the dorsum of the hand.  This extended down into the first webspace on the volar aspect of the thumb.  Volarly necrotic tissue was debrided along the carpal tunnel release wound and also over the wrist flexor tendons.  There was a large bone fragment in the soft tissues overlying the median nerve in the carpal tunnel and this bone fragment was removed. This resulted in some exposure of the median nerve and the  rest flexor tendons were exposed from the previous debridement.  At this point it was determined that full thickness grafting would not be appropriate.  Dr. Etter Sjogren was consulted and he came to the operating room to evaluate the patient.  He recommended putting a wound VAC on and so the nurse was brought in to apply the wound VAC.  After the application of the wound VAC, volar splint was applied.  The patient went to the recovery room awake and stable in good condition. Dictated by:   Lowell Bouton, M.D. Attending Physician:  Kendell Bane DD:  06/03/01 TD:  06/03/01 Job: [id number]

## 2011-01-22 ENCOUNTER — Encounter: Payer: Self-pay | Admitting: Cardiovascular Disease

## 2011-01-24 ENCOUNTER — Encounter: Payer: Self-pay | Admitting: *Deleted

## 2011-01-25 ENCOUNTER — Ambulatory Visit (INDEPENDENT_AMBULATORY_CARE_PROVIDER_SITE_OTHER): Payer: Medicare Other | Admitting: Cardiovascular Disease

## 2011-01-25 ENCOUNTER — Encounter: Payer: Self-pay | Admitting: Cardiovascular Disease

## 2011-01-25 ENCOUNTER — Ambulatory Visit (INDEPENDENT_AMBULATORY_CARE_PROVIDER_SITE_OTHER): Payer: Medicare Other | Admitting: *Deleted

## 2011-01-25 VITALS — BP 120/78 | HR 60 | Ht 71.0 in | Wt 211.0 lb

## 2011-01-25 DIAGNOSIS — E785 Hyperlipidemia, unspecified: Secondary | ICD-10-CM

## 2011-01-25 DIAGNOSIS — Z95810 Presence of automatic (implantable) cardiac defibrillator: Secondary | ICD-10-CM

## 2011-01-25 DIAGNOSIS — I2510 Atherosclerotic heart disease of native coronary artery without angina pectoris: Secondary | ICD-10-CM

## 2011-01-25 DIAGNOSIS — I501 Left ventricular failure: Secondary | ICD-10-CM

## 2011-01-25 LAB — LIPID PANEL
LDL Cholesterol: 107 mg/dL — ABNORMAL HIGH (ref 0–99)
Total CHOL/HDL Ratio: 5
Triglycerides: 165 mg/dL — ABNORMAL HIGH (ref 0.0–149.0)

## 2011-01-25 LAB — HEPATIC FUNCTION PANEL
ALT: 16 U/L (ref 0–53)
Bilirubin, Direct: 0.1 mg/dL (ref 0.0–0.3)
Total Bilirubin: 0.6 mg/dL (ref 0.3–1.2)

## 2011-01-25 LAB — BASIC METABOLIC PANEL
Calcium: 9.5 mg/dL (ref 8.4–10.5)
Chloride: 106 mEq/L (ref 96–112)
Creatinine, Ser: 0.9 mg/dL (ref 0.4–1.5)
GFR: 87.7 mL/min (ref >60.00)

## 2011-01-25 NOTE — Assessment & Plan Note (Addendum)
He's doing very well from a cardiac standpoint. His bypass grafts are almost 65 years old at this point. We'll continue the same medications. We'll check his lipid levels, H&P, and basic metabolic profile today.  We'll see him again in 6 months with fasting laboratory.  He's been eating fish. He's been exercising some period we'll encourage him to continue with a good diet and exercise program.

## 2011-01-25 NOTE — Patient Instructions (Signed)
Your physician wants you to follow-up in: 6 months, You will receive a reminder letter in the mail two months in advance. If you don't receive a letter, please call our office to schedule the follow-up appointment.   Your physician recommends that you return for a FASTING lipid profile: 6 months and today.  Your physician recommends that you continue on your current medications as directed. Please refer to the Current Medication list given to you today.

## 2011-01-25 NOTE — Assessment & Plan Note (Signed)
He is followed  by Dr. Ladona Ridgel in device clinic.

## 2011-01-25 NOTE — Assessment & Plan Note (Signed)
He seems to be relatively stable. He has not had any episodes of shortness of breath with his normal exertion. We'll continue with the same medications.  It's been somewhat difficult to keep him on a steady medical regimen. He tends to feel well we treated with standard heart failure medicine. He has been tried on we'll set up little and Avapro did not tolerate either one of them. He remains on the metoprolol.
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
## 2011-01-29 MED ORDER — EZETIMIBE 10 MG PO TABS: 10.0000 mg | ORAL_TABLET | Freq: Every day | ORAL | Status: DC

## 2011-01-29 NOTE — Patient Instructions (Signed)
Pt was called and is willing to try zetia, pt has app to f/u on 2 mo as planned by Dr Na for labs. Mailed copy of labs to him.

## 2011-06-25 ENCOUNTER — Telehealth: Payer: Self-pay | Admitting: Cardiovascular Disease

## 2011-06-25 NOTE — Telephone Encounter (Signed)
New Msg: Pt calling wanting to speak with nurse/MD about pt wanting to see MD prior to pt appt on 07/26/11. Please return pt call to discuss further.

## 2011-06-25 NOTE — Telephone Encounter (Signed)
App made tomorrow with NP Tyrone Sage

## 2011-06-26 ENCOUNTER — Encounter: Payer: Self-pay | Admitting: Nurse Practitioner

## 2011-06-26 ENCOUNTER — Ambulatory Visit (INDEPENDENT_AMBULATORY_CARE_PROVIDER_SITE_OTHER): Payer: Medicare Other | Admitting: Nurse Practitioner

## 2011-06-26 VITALS — BP 124/82 | HR 78 | Ht 69.0 in | Wt 210.0 lb

## 2011-06-26 DIAGNOSIS — I2589 Other forms of chronic ischemic heart disease: Secondary | ICD-10-CM

## 2011-06-26 DIAGNOSIS — R509 Fever, unspecified: Secondary | ICD-10-CM

## 2011-06-26 NOTE — Patient Instructions (Signed)
We are worried that you may have an infection on your defib  We need to do some labs today including blood cultures  We are going to get an ultrasound of your heart  No change in your medicines  Keep a record of your temperature  Call the [facility] office at [phone] if you have any questions, problems or concerns.

## 2011-06-26 NOTE — Assessment & Plan Note (Addendum)
Patient presents with recurrent fever of unknown etiology. He does have a device in place and we will need to rule the device out as the source of the fever.  I have already spoken with Dr. Elease Hashimoto. We will be checking complete labs today to include chemistries, CBC with diff, sed rate, and LFT's. We will also draw 2 blood cultures. We will get an echo on him tomorrow but will probably need to consider TEE. If his blood cultures are initially negative, may need to have a 2nd set drawn as well. With his occupation regarding raising chickens, may need to give consideration for some type of bird disease. If our studies are negative, he will need to be referred to Infectious Disease. He is to monitor his temperature and keep a diary. Patient and his wife are agreeable to this plan and will call if any problems develop in the interim.

## 2011-06-26 NOTE — Assessment & Plan Note (Signed)
He has been doing well from a cardiac standpoint with no symptoms.
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
## 2011-06-27 ENCOUNTER — Ambulatory Visit (HOSPITAL_COMMUNITY): Payer: Medicare Other | Attending: Cardiovascular Disease

## 2011-06-27 ENCOUNTER — Other Ambulatory Visit: Payer: Self-pay

## 2011-06-27 DIAGNOSIS — I379 Nonrheumatic pulmonary valve disorder, unspecified: Secondary | ICD-10-CM | POA: Insufficient documentation

## 2011-06-27 DIAGNOSIS — I509 Heart failure, unspecified: Secondary | ICD-10-CM | POA: Insufficient documentation

## 2011-06-27 DIAGNOSIS — E785 Hyperlipidemia, unspecified: Secondary | ICD-10-CM | POA: Insufficient documentation

## 2011-06-27 DIAGNOSIS — R509 Fever, unspecified: Secondary | ICD-10-CM

## 2011-06-27 DIAGNOSIS — I059 Rheumatic mitral valve disease, unspecified: Secondary | ICD-10-CM | POA: Insufficient documentation

## 2011-06-27 DIAGNOSIS — I079 Rheumatic tricuspid valve disease, unspecified: Secondary | ICD-10-CM | POA: Insufficient documentation

## 2011-06-27 DIAGNOSIS — I2589 Other forms of chronic ischemic heart disease: Secondary | ICD-10-CM

## 2011-06-27 DIAGNOSIS — I447 Left bundle-branch block, unspecified: Secondary | ICD-10-CM | POA: Insufficient documentation

## 2011-06-27 DIAGNOSIS — I2510 Atherosclerotic heart disease of native coronary artery without angina pectoris: Secondary | ICD-10-CM | POA: Insufficient documentation

## 2011-06-27 LAB — HEPATIC FUNCTION PANEL
ALT: 19 U/L (ref 0–53)
AST: 23 U/L (ref 0–37)
Albumin: 3.9 g/dL (ref 3.5–5.2)
Alkaline Phosphatase: 70 U/L (ref 39–117)
Bilirubin, Direct: 0.1 mg/dL (ref 0.0–0.3)
Total Bilirubin: 0.2 mg/dL — ABNORMAL LOW (ref 0.3–1.2)
Total Protein: 7.6 g/dL (ref 6.0–8.3)

## 2011-06-27 LAB — BASIC METABOLIC PANEL
BUN: 18 mg/dL (ref 6–23)
CO2: 25 mEq/L (ref 19–32)
Calcium: 9.2 mg/dL (ref 8.4–10.5)
Chloride: 103 mEq/L (ref 96–112)
Creatinine, Ser: 1 mg/dL (ref 0.4–1.5)
GFR: 76.88 mL/min (ref >60.00)
Glucose, Bld: 113 mg/dL — ABNORMAL HIGH (ref 70–99)
Potassium: 4.7 mEq/L (ref 3.5–5.1)
Sodium: 137 mEq/L (ref 135–145)

## 2011-06-27 LAB — CBC WITH DIFFERENTIAL/PLATELET
Basophils Absolute: 0 10*3/uL (ref 0.0–0.1)
Basophils Relative: 0.7 % (ref 0.0–3.0)
Eosinophils Absolute: 0.2 10*3/uL (ref 0.0–0.7)
Eosinophils Relative: 3.3 % (ref 0.0–5.0)
HCT: 43 % (ref 39.0–52.0)
Hemoglobin: 14.4 g/dL (ref 13.0–17.0)
Lymphocytes Relative: 24.7 % (ref 12.0–46.0)
Lymphs Abs: 1.7 10*3/uL (ref 0.7–4.0)
MCHC: 33.4 g/dL (ref 30.0–36.0)
MCV: 89.3 fl (ref 78.0–100.0)
Monocytes Absolute: 0.6 10*3/uL (ref 0.1–1.0)
Monocytes Relative: 8.3 % (ref 3.0–12.0)
Neutro Abs: 4.3 10*3/uL (ref 1.4–7.7)
Neutrophils Relative %: 63 % (ref 43.0–77.0)
Platelets: 107 10*3/uL — ABNORMAL LOW (ref 150.0–400.0)
RBC: 4.82 Mil/uL (ref 4.22–5.81)
RDW: 14.4 % (ref 11.5–14.6)
WBC: 6.9 10*3/uL (ref 4.5–10.5)

## 2011-06-27 LAB — URINALYSIS, ROUTINE W REFLEX MICROSCOPIC
Bilirubin Urine: NEGATIVE
Hgb urine dipstick: NEGATIVE
Ketones, ur: NEGATIVE
Leukocytes, UA: NEGATIVE
Nitrite: NEGATIVE
Specific Gravity, Urine: 1.02 (ref 1.000–1.030)
Total Protein, Urine: NEGATIVE
Urine Glucose: NEGATIVE
Urobilinogen, UA: 0.2 (ref 0.0–1.0)
pH: 5.5 (ref 5.0–8.0)

## 2011-06-27 LAB — SEDIMENTATION RATE: Sed Rate: 32 mm/hr — ABNORMAL HIGH (ref 0–22)

## 2011-07-02 ENCOUNTER — Encounter: Payer: Self-pay | Admitting: *Deleted

## 2011-07-02 ENCOUNTER — Ambulatory Visit: Payer: Medicare Other | Admitting: *Deleted

## 2011-07-02 ENCOUNTER — Telehealth: Payer: Self-pay | Admitting: *Deleted

## 2011-07-02 DIAGNOSIS — R700 Elevated erythrocyte sedimentation rate: Secondary | ICD-10-CM

## 2011-07-02 DIAGNOSIS — R509 Fever, unspecified: Secondary | ICD-10-CM

## 2011-07-02 DIAGNOSIS — I2510 Atherosclerotic heart disease of native coronary artery without angina pectoris: Secondary | ICD-10-CM

## 2011-07-02 DIAGNOSIS — Z950 Presence of cardiac pacemaker: Secondary | ICD-10-CM

## 2011-07-02 DIAGNOSIS — R5383 Other fatigue: Secondary | ICD-10-CM

## 2011-07-02 LAB — CULTURE, BLOOD (SINGLE)
Organism ID, Bacteria: NO GROWTH
Organism ID, Bacteria: NO GROWTH

## 2011-07-02 NOTE — Telephone Encounter (Signed)
ONE SET OF CULTURES ORDERED, TEE SCHEDULED FOR 07/05/11 10 AM, ARRIVE 8 AM.

## 2011-07-02 NOTE — Telephone Encounter (Signed)
Message copied by Antony Odea on Tue Jul 02, 2011 10:01 AM ------      Message from: Rosalio Macadamia      Created: Tue Jul 02, 2011  9:06 AM       Ok to report. Dr. Ladona Ridgel had suggested doing another set of blood cultures.

## 2011-07-03 ENCOUNTER — Telehealth: Payer: Self-pay | Admitting: *Deleted

## 2011-07-03 DIAGNOSIS — R509 Fever, unspecified: Secondary | ICD-10-CM

## 2011-07-03 DIAGNOSIS — R5383 Other fatigue: Secondary | ICD-10-CM

## 2011-07-03 NOTE — Telephone Encounter (Signed)
App switched from 07/05/11 to 07/04/11 for TEE with Dr Elease Hashimoto per his request. Staff/ pt notified of change. Also placed order for urgent referral for infectious disease for fever of 101.0- 102.0 periodically of unknown origin, at least weekly for approximately 4-5 months. Fever resolves with acetaminophen use and returns weekly or so.  Pt was working with PCP without resolve when pt called for advise, now cultures have been done, a series of two different days. Tee to be done 07/04/11 with dr Elease Hashimoto. Pt has ICD, CAD and LBBB.

## 2011-07-04 ENCOUNTER — Encounter (HOSPITAL_COMMUNITY): Payer: Self-pay | Admitting: *Deleted

## 2011-07-04 ENCOUNTER — Ambulatory Visit (HOSPITAL_COMMUNITY)
Admission: RE | Admit: 2011-07-04 | Discharge: 2011-07-04 | Disposition: A | Discharge status: Home / Self Care | Payer: Medicare Other | Source: Ambulatory Visit | Attending: Cardiovascular Disease | Admitting: Cardiovascular Disease

## 2011-07-04 ENCOUNTER — Encounter (HOSPITAL_COMMUNITY)
Admission: RE | Disposition: A | Discharge status: Home / Self Care | Payer: Self-pay | Source: Ambulatory Visit | Attending: Cardiovascular Disease

## 2011-07-04 DIAGNOSIS — Z5309 Procedure and treatment not carried out because of other contraindication: Secondary | ICD-10-CM | POA: Insufficient documentation

## 2011-07-04 DIAGNOSIS — K222 Esophageal obstruction: Secondary | ICD-10-CM | POA: Insufficient documentation

## 2011-07-04 DIAGNOSIS — R509 Fever, unspecified: Secondary | ICD-10-CM | POA: Insufficient documentation

## 2011-07-04 HISTORY — PX: TEE WITHOUT CARDIOVERSION: SHX5443

## 2011-07-04 HISTORY — DX: Presence of cardiac pacemaker: Z95.0

## 2011-07-04 SURGERY — ECHOCARDIOGRAM, TRANSESOPHAGEAL
Anesthesia: Moderate Sedation

## 2011-07-04 MED ORDER — BUTAMBEN-TETRACAINE-BENZOCAINE 2-2-14 % EX AERO
INHALATION_SPRAY | CUTANEOUS | Status: DC | PRN
  Administered 2011-07-04: 2 via TOPICAL

## 2011-07-04 MED ORDER — SODIUM CHLORIDE 0.45 % IV SOLN
INTRAVENOUS | Status: DC
  Administered 2011-07-04: 500 mL via INTRAVENOUS

## 2011-07-04 MED ORDER — FENTANYL CITRATE 0.05 MG/ML IJ SOLN
INTRAMUSCULAR | Status: AC
  Filled 2011-07-04: qty 2

## 2011-07-04 MED ORDER — FENTANYL CITRATE 0.05 MG/ML IJ SOLN
INTRAMUSCULAR | Status: DC | PRN
  Administered 2011-07-04 (×4): 25 ug via INTRAVENOUS

## 2011-07-04 MED ORDER — MIDAZOLAM HCL 10 MG/2ML IJ SOLN
INTRAMUSCULAR | Status: AC
  Filled 2011-07-04: qty 2

## 2011-07-04 MED ORDER — MIDAZOLAM HCL 10 MG/2ML IJ SOLN
INTRAMUSCULAR | Status: DC | PRN
  Administered 2011-07-04 (×3): 2 mg via INTRAVENOUS
  Administered 2011-07-04: 1 mg via INTRAVENOUS

## 2011-07-04 MED ORDER — SODIUM CHLORIDE 0.9 % IV SOLN: INTRAVENOUS | Status: DC
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
## 2011-07-04 NOTE — Op Note (Signed)
   Transesophageal Echocardiogram Note  [name] [id number] 1945/12/10  Procedure: Transesophageal Echocardiogram Attempt. Indications: recurrent fevers  Procedure Details Consent: Obtained  Time Out: Verified patient identification, verified procedure, site/side was marked, verified correct patient position, special equipment/implants available, Radiology Safety Procedures followed,  medications/allergies/relevent history reviewed, required imaging and test results available.  Performed  Medications: Fentanyl: 100 mcg IV Versed: 7 mg IV  We were able to pass the [place] scope about 25-27 cm and then hit an obstruction.  The patient has a hx of esophageal strictures and has esophageal dilation every 6 months or so.  It has been about 6 months since his last dilation.  We paged Dr. Bosie Clos but he was doing procedures at [facility].    I would prefer that he had an EGD with Dr. Madilyn Fireman with possible dilation and then we can proceed with TEE from there.   Complications: No apparent complications Patient did tolerate procedure well.   Vesta Mixer, Montez Hageman., MD, [facility] 07/04/2011, 10:32 AM

## 2011-07-04 NOTE — Discharge Instructions (Signed)
We will contact you to arrange repeat TEE after your endoscopy.Transesophageal Echocardiography A transesophageal echocardiogram (TEE) is a special type of test that produces images of the heart by sound waves (echocardiogram). This type of echocardiogram can obtain better images of the heart than a standard echocardiogram. A TEE is done by passing a flexible tube down the esophagus. The heart is located in front of the esophagus. Because the heart and esophagus are close to one another, your caregiver can take very clear, detailed pictures of the heart via ultrasound waves. WHY HAVE A TEE? Your caregiver may need more information based on your medical condition. A TEE is usually performed due to the following:  Your caregiver needs more information based on standard echocardiogram findings.   If you had a stroke, this might have happened because a clot formed in your heart. A TEE can visualize different areas of the heart and check for clots.   To check valve anatomy and function. Your caregiver will especially look at the mitral valve.   To check for redness, soreness, and swelling (inflammation) on the inside lining of the heart (endocarditis).   To evaluate the dividing wall (septum) of the heart and presence of a hole that did not close after birth (patent foramen ovale, PFO).   To help diagnose a tear in the wall of the aorta (aortic dissection).   During cardiac valve surgery, a TEE probe is placed. This allows the surgeon to assess the valve repair before closing the chest.  LET YOUR CAREGIVER KNOW ABOUT:   Swallowing difficulties.   An esophageal obstruction.   Use of aspirin or antiplatelet therapy.  RISKS AND COMPLICATIONS  Though extremely rare, an esophageal tear (rupture) is a potential complication. BEFORE THE PROCEDURE   Arrive at least 1 hour before the procedure or as told by your caregiver.   Do not eat or drink for 6 hours before the procedure or as told by your  caregiver.   An intravenous (IV) access tube will be started in the arm.  PROCEDURE   A medicine to help you relax (sedative) will be given through the IV.   A medicine that numbs the area (local anesthetic) may be sprayed to the back of the throat.   Your blood pressure, heart rate, and breathing (vital signs) will be monitored during the procedure.   The TEE probe is a long, flexible tube. It is about the width of an adult male's index finger. The tip of the probe is placed into the back of the mouth and you will be asked to swallow. This helps to pass the tip of the probe into the esophagus. Once the tip of the probe is in the correct area, your caregiver can take pictures of the heart.   A TEE is usually not a painful procedure. You may feel the probe press against the back of the throat. The probe does not enter the trachea and does not affect your breathing.   Your time spent at the hospital is usually less than 2 hours.  AFTER THE PROCEDURE   You will be in bed, resting until you have fully returned to consciousness.   When you first awaken, your throat may feel slightly sore and will probably still feel numb. This will improve slowly over time.   You will not be allowed to eat or drink until it is clear that numbness has improved.   Once you have been able to drink, urinate, and sit on the edge  of the bed without feeling sick to your stomach (nauseous) or dizzy, you may be cleared to dress and go home.   Do not drive yourself home. You have had medications that can continue to make you feel drowsy and can impair your reflexes.   You should have a friend or family member with you for the next 24 hours after your examination.  Obtaining the test results It is your responsibility to obtain your test results. Ask the lab or department performing the test when and how you will get your results. SEEK IMMEDIATE MEDICAL CARE IF:   There is chest pain.   You have a hard time  breathing or have shortness of breath.   You cough or throw up (vomit) blood.  MAKE SURE YOU:   Understand these instructions.   Will watch this condition.   Will get help right away if you is not doing well or gets worse.  Document Released: 06/08/2002 Document Revised: 03/07/2011 Document Reviewed: 08/30/2008 [facility] Patient Information 2012 [place], [place].

## 2011-07-04 NOTE — Interval H&P Note (Signed)
History and Physical Interval Note:  07/04/2011 10:07 AM  [name]  has presented today for surgery, with the diagnosis of fever of unknown origin  The various methods of treatment have been discussed with the patient and family. After consideration of risks, benefits and other options for treatment, the patient has consented to  Procedure(s) (LRB): TRANSESOPHAGEAL ECHOCARDIOGRAM (TEE) (N/A) as a surgical intervention .  The patients' history has been reviewed, patient examined, no change in status, stable for surgery.  I have reviewed the patients' chart and labs.  Questions were answered to the patient's satisfaction.    Pt has hx of esophageal stricture and has had esophageal dilitation.  Will proceed with TEE if we are able to pass the scope without problems.  Hx of recurrent fevers.  Needs to be see ID.  Has apt. On Thursday.   [name].

## 2011-07-05 ENCOUNTER — Encounter (HOSPITAL_COMMUNITY): Payer: Self-pay | Admitting: Cardiovascular Disease

## 2011-07-11 ENCOUNTER — Ambulatory Visit
Admission: RE | Admit: 2011-07-11 | Discharge: 2011-07-11 | Disposition: A | Discharge status: Home / Self Care | Payer: Medicare Other | Source: Ambulatory Visit | Attending: Internal Medicine | Admitting: Internal Medicine

## 2011-07-11 ENCOUNTER — Encounter: Payer: Self-pay | Admitting: Internal Medicine

## 2011-07-11 ENCOUNTER — Ambulatory Visit (INDEPENDENT_AMBULATORY_CARE_PROVIDER_SITE_OTHER): Payer: Medicare Other | Admitting: Internal Medicine

## 2011-07-11 VITALS — BP 152/82 | HR 77 | Temp 98.3°F | Ht 70.0 in | Wt 209.0 lb

## 2011-07-11 DIAGNOSIS — R509 Fever, unspecified: Secondary | ICD-10-CM

## 2011-07-11 DIAGNOSIS — D649 Anemia, unspecified: Secondary | ICD-10-CM

## 2011-07-11 DIAGNOSIS — R634 Abnormal weight loss: Secondary | ICD-10-CM

## 2011-07-11 DIAGNOSIS — R5383 Other fatigue: Secondary | ICD-10-CM | POA: Insufficient documentation

## 2011-07-11 LAB — CBC
Hemoglobin: 12.7 g/dL — ABNORMAL LOW (ref 13.0–17.0)
MCH: 28.7 pg (ref 26.0–34.0)
MCHC: 31.5 g/dL (ref 30.0–36.0)
RDW: 14.3 % (ref 11.5–15.5)

## 2011-07-11 LAB — COMPREHENSIVE METABOLIC PANEL
AST: 20 U/L (ref 0–37)
Albumin: 3.5 g/dL (ref 3.5–5.2)
Alkaline Phosphatase: 62 U/L (ref 39–117)
Glucose, Bld: 101 mg/dL — ABNORMAL HIGH (ref 70–99)
Potassium: 4.1 mEq/L (ref 3.5–5.3)
Sodium: 140 mEq/L (ref 135–145)
Total Bilirubin: 0.3 mg/dL (ref 0.3–1.2)
Total Protein: 6.6 g/dL (ref 6.0–8.3)
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
## 2011-07-12 DIAGNOSIS — D649 Anemia, unspecified: Secondary | ICD-10-CM | POA: Insufficient documentation

## 2011-07-12 NOTE — Progress Notes (Signed)
Addended by: [name] on: 07/12/2011 02:07 PM   Modules accepted: Orders

## 2011-07-15 ENCOUNTER — Telehealth: Payer: Self-pay | Admitting: *Deleted

## 2011-07-15 NOTE — Progress Notes (Signed)
Addended by: Jennet Maduro D on: 07/15/2011 10:10 AM   Modules accepted: Orders

## 2011-07-15 NOTE — Telephone Encounter (Signed)
Pt will need to come to [facility] to pick-up contrast media for CT scans scheduled for Thurs, April 18 at 12:30 PM.  Pt stated that he would pick it up tomorrow.  Scan will take about an hour and pt will come for appt w/ Dr. Orvan Falconer afterward.  Pt verbalized that he understood about picking up contrast media and CT appt on Thursday.

## 2011-07-18 ENCOUNTER — Ambulatory Visit (INDEPENDENT_AMBULATORY_CARE_PROVIDER_SITE_OTHER): Payer: Medicare Other | Admitting: Internal Medicine

## 2011-07-18 ENCOUNTER — Encounter: Payer: Self-pay | Admitting: Internal Medicine

## 2011-07-18 ENCOUNTER — Ambulatory Visit (HOSPITAL_COMMUNITY)
Admission: RE | Admit: 2011-07-18 | Discharge: 2011-07-18 | Disposition: A | Discharge status: Home / Self Care | Payer: Medicare Other | Source: Ambulatory Visit | Attending: Internal Medicine | Admitting: Internal Medicine

## 2011-07-18 VITALS — BP 151/67 | HR 50 | Temp 99.0°F | Ht 70.0 in | Wt 209.5 lb

## 2011-07-18 DIAGNOSIS — K222 Esophageal obstruction: Secondary | ICD-10-CM

## 2011-07-18 DIAGNOSIS — K8020 Calculus of gallbladder without cholecystitis without obstruction: Secondary | ICD-10-CM | POA: Insufficient documentation

## 2011-07-18 DIAGNOSIS — R634 Abnormal weight loss: Secondary | ICD-10-CM | POA: Insufficient documentation

## 2011-07-18 DIAGNOSIS — R918 Other nonspecific abnormal finding of lung field: Secondary | ICD-10-CM | POA: Insufficient documentation

## 2011-07-18 DIAGNOSIS — R509 Fever, unspecified: Secondary | ICD-10-CM | POA: Insufficient documentation

## 2011-07-18 DIAGNOSIS — Z950 Presence of cardiac pacemaker: Secondary | ICD-10-CM | POA: Insufficient documentation

## 2011-07-18 MED ORDER — IOHEXOL 300 MG/ML  SOLN: 100.0000 mL | Freq: Once | INTRAMUSCULAR | Status: AC | PRN

## 2011-07-18 NOTE — Progress Notes (Signed)
Patient ID: [name]., male   DOB: 1945-11-02, 66 y.o.   MRN: [id number]  INFECTIOUS DISEASE PROGRESS NOTE    Subjective: [name] is in for his one-week followup visit. He states that he has continued to have fevers, chills, sweats and myalgias. He has had one or 2 good days in the past week but has felt pretty miserable the other days. He brings his temperature log in with him and I note that he had a temperature of 101 on April 16. He came in today for his CT scan and says that he had fever of 102 after the scan. He tells me that that was his estimate of his temperature but the other temperatures recorded at home were obtained with a digital thermometer. He states that his appetite remains poor and his difficulty swallowing, particularly solid food, is unchanged. He has not had any headache, visual change, cough, shortness of breath, nausea, vomiting, diarrhea, or dysuria.  Objective: Temp: 99 F (37.2 C) (04/18 1525) Temp src: Oral (04/18 1525) BP: 151/67 mmHg (04/18 1525) Pulse Rate: 50  (04/18 1525)  General: He is in good spirits Skin: No rash or palpable adenopathy Oral: No change in his chronic perleche. No oral lesions Lungs: Clear Cor: Regular S1 and S2 with no murmurs Abdomen: Distended but nontender. Joints and extremities: Normal  Lab Results Lab Results  Component Value Date   WBC 5.8 07/11/2011   HGB 12.7* 07/11/2011   HCT 40.3 07/11/2011   MCV 91.0 07/11/2011   PLT 172 07/11/2011    Lab Results  Component Value Date   CREATININE 1.03 07/11/2011   BUN 17 07/11/2011   NA 140 07/11/2011   K 4.1 07/11/2011   CL 107 07/11/2011   CO2 23 07/11/2011    Lab Results  Component Value Date   ALT 13 07/11/2011   AST 20 07/11/2011   ALKPHOS 62 07/11/2011   BILITOT 0.3 07/11/2011      Assessment: He continues to have frequent fevers although none of them have been documented during his visits. His thrombocytopenia has resolved but he has developed a mild normocytic  anemia. His chest, abdomen and pelvic CT scan today did not reveal any acute abnormalities that would explain his fever. He has had recent blood cultures were negative. So far there are no other useful clues on exam or in his lab work to suggest an etiology. He has not lost any further weight so I suggest continued observation and followup in one week.  Plan: 1. Continue observation off of antibiotics 2. I've asked him to continue his diary and note the time of day that his fever occurs 3. Followup in one week   [name], MD [facility] for Infectious Diseases [facility] [phone] pager   [phone] cell 07/18/2011, 5:24 PM

## 2011-07-19 ENCOUNTER — Telehealth: Payer: Self-pay | Admitting: *Deleted

## 2011-07-19 NOTE — Telephone Encounter (Signed)
Wife called to report his temp yesterday afternoon was 103.8. This am it is 100. Took ibuprofen 800mg  at lunch & in pm. Wants md to know this. They do have a f/u appt in 1 week & he is keeping a log of when his fevers occur. I asked her to encourage him to drink plenty of fluids

## 2011-07-22 ENCOUNTER — Telehealth: Payer: Self-pay | Admitting: *Deleted

## 2011-07-22 NOTE — Telephone Encounter (Signed)
He should continue to follow up with ID.  There is nothing I can do until he has his esophagus dilated.  He may need more blood cultures. - that would be up to ID.

## 2011-07-22 NOTE — Telephone Encounter (Signed)
Message copied by Antony Odea on Mon Jul 22, 2011  5:34 PM ------      Message from: Omar Person B      Created: Wed Jul 03, 2011  3:18 PM      Regarding: Appointment with Infec. Disease       Spoke with Asher Muir- patient has appt with Dr. Orvan Falconer on 07-11-10 @ 2pm  And Mr. Seeberger is aware of the appt ,       ----- Message -----         From: Pricilla Holm         Sent: 07/03/2011  10:00 AM           To: Pricilla Holm, Antony Odea, RN      Subject: ID MD                                                    Spoke with Asher Muir with Infection Disease- she stated she will talk with Dr. Ninetta Lights to look over the records before an appointment will be made.  She will call back after he has done so.    Her number is [phone].            Jasmine December

## 2011-07-22 NOTE — Telephone Encounter (Signed)
Agree, he needs to see ID again.

## 2011-07-22 NOTE — Telephone Encounter (Signed)
Pt continues to have fevers, yesterday 103.8. Has seen dr campbell/ infectious disease twice, sees this week again, he has app in 2 weeks with dr  Bing, he does the throat dilatation. FYI

## 2011-07-24 ENCOUNTER — Other Ambulatory Visit: Payer: Self-pay | Admitting: Gastroenterology

## 2011-07-24 ENCOUNTER — Ambulatory Visit
Admission: RE | Admit: 2011-07-24 | Discharge: 2011-07-24 | Disposition: A | Discharge status: Home / Self Care | Payer: Medicare Other | Source: Ambulatory Visit | Attending: Gastroenterology | Admitting: Gastroenterology

## 2011-07-25 ENCOUNTER — Ambulatory Visit: Payer: Medicare Other | Admitting: Internal Medicine

## 2011-07-25 ENCOUNTER — Telehealth: Payer: Self-pay | Admitting: Cardiovascular Disease

## 2011-07-25 NOTE — Telephone Encounter (Signed)
LOV( Nahser) faxed to Dr.Hayes/[facility] @ [phone] 07/25/11/KM

## 2011-07-26 ENCOUNTER — Ambulatory Visit: Payer: Medicare Other | Admitting: Cardiovascular Disease

## 2011-07-26 ENCOUNTER — Other Ambulatory Visit: Payer: Medicare Other

## 2011-07-29 ENCOUNTER — Encounter: Payer: Self-pay | Admitting: Internal Medicine

## 2011-07-29 ENCOUNTER — Ambulatory Visit (INDEPENDENT_AMBULATORY_CARE_PROVIDER_SITE_OTHER): Payer: Medicare Other | Admitting: Internal Medicine

## 2011-07-29 VITALS — BP 153/70 | HR 82 | Temp 97.3°F | Ht 70.0 in | Wt 201.5 lb

## 2011-07-29 DIAGNOSIS — R509 Fever, unspecified: Secondary | ICD-10-CM

## 2011-07-29 LAB — COMPREHENSIVE METABOLIC PANEL
Alkaline Phosphatase: 62 U/L (ref 39–117)
CO2: 24 mEq/L (ref 19–32)
Creat: 1.1 mg/dL (ref 0.50–1.35)
Glucose, Bld: 114 mg/dL — ABNORMAL HIGH (ref 70–99)
Total Bilirubin: 0.4 mg/dL (ref 0.3–1.2)

## 2011-07-29 LAB — C-REACTIVE PROTEIN: CRP: 11.58 mg/dL — ABNORMAL HIGH (ref <0.60)

## 2011-07-29 NOTE — Progress Notes (Signed)
Patient ID: Carlos Skolnick., male   DOB: [birth year], 66 y.o.   MRN: [id number]  INFECTIOUS DISEASE PROGRESS NOTE    Subjective: [name] is in for his routine followup visit. He continues to be bothered by daily fevers and drenching sweats. He states that he still has no appetite and he continues to lose weight. He says that he is extremely weak and getting weaker. He now states that it is difficult to walk in from the parking lot without feeling exhausted and short of breath. He also states that he is now having worsening pains in his joints. He particularly notes pain in his hips, right greater than left. He has also noted a little bit of blurring in his vision.  He has developed a recent cough that can sometimes be productive of white sputum. He is also developed worsening heartburn and recently decided to double his Nexium dose and has also started taking Pepcid and Tums. He recently had a barium swallow and once unable to pass the barium tablet. He tells me he is going to be scheduled for endoscopy in the next few weeks to have esophageal dilatation and transesophageal echocardiography at the same time.  He now recalls that he scratched his left calf on a word in his chicken coop last October it was slow to heal. He also tells me that he was diagnosed with rheumatoid arthritis many years ago but cannot remember who told him that. He believes he saw a rheumatologist but cannot remember who it was or even if it was in [place].  Objective: Temp: 97.3 F (36.3 C) (04/29 1611) Temp src: Oral (04/29 1611) BP: 153/70 mmHg (04/29 1611) Pulse Rate: 82  (04/29 1611)  General: He looks a little more worn out but still has a sense of humor. His weight is down from 230 pounds last October her to 201 pounds. Head/eyes: His conjunctivae are normal. His neck is supple. He does have some tenderness bilaterally over his temporal arteries with strong pulsation noted. Skin: There is no change in the  scarring of his left hand and the vitiligo on his right hand. He has a very faint superficial scar in his left calf without any other inflammation. There is no acute rash. Oral: His oral mucosa is pain and moist without any lesions. Nodes: No palpable adenopathy Lungs: Clear Cor: Regular S1 and S2 with no murmurs or gallops. His left upper chest defibrillator site is normal. Abdomen: Distended but soft and nontender with normal bowel sounds Joints: No active inflammation  Lab Results Lab Results  Component Value Date   WBC 5.8 07/11/2011   HGB 12.7* 07/11/2011   HCT 40.3 07/11/2011   MCV 91.0 07/11/2011   PLT 172 07/11/2011    Lab Results  Component Value Date   ALT 13 07/11/2011   AST 20 07/11/2011   ALKPHOS 62 07/11/2011   BILITOT 0.3 07/11/2011      Assessment: The cause of his FUO and unintentional weight loss remains unclear. Some of his weight loss could be related to his ongoing problems with esophageal stricture but I suspect that there is some other systemic problem. The new history of arthralgias and temporal artery tenderness on palpation suggests the possibility of polymyalgia rheumatica or temporal arteritis. I do not see any clear evidence of active infection at this time. I will continue to observe off of antibiotics, check lab work today, and refer for temporal artery biopsy.  Plan: 1. Observe off of antibiotics 2. Blood  work today including CBC with differential, complete metabolic panel, sedimentation rate, C-reactive protein, ANA, rheumatoid factor and repeat blood cultures 3. General surgery for referral for temporal artery biopsy 4. Return in 2 weeks   [name], MD [facility] for Infectious Diseases [facility] [phone] pager   [phone] cell 07/29/2011, 5:04 PM

## 2011-07-30 LAB — CBC WITH DIFFERENTIAL/PLATELET
Basophils Relative: 1 % (ref 0–1)
Eosinophils Absolute: 0.1 10*3/uL (ref 0.0–0.7)
Eosinophils Relative: 1 % (ref 0–5)
Hemoglobin: 12.7 g/dL — ABNORMAL LOW (ref 13.0–17.0)
Lymphs Abs: 1.7 10*3/uL (ref 0.7–4.0)
MCH: 27.3 pg (ref 26.0–34.0)
MCHC: 31 g/dL (ref 30.0–36.0)
MCV: 88.2 fL (ref 78.0–100.0)
Monocytes Relative: 7 % (ref 3–12)
Platelets: 91 10*3/uL — ABNORMAL LOW (ref 150–400)
RBC: 4.65 MIL/uL (ref 4.22–5.81)

## 2011-07-30 LAB — SEDIMENTATION RATE: Sed Rate: 28 mm/hr — ABNORMAL HIGH (ref 0–16)

## 2011-07-30 LAB — ANA: Anti Nuclear Antibody(ANA): NEGATIVE

## 2011-07-31 ENCOUNTER — Ambulatory Visit (INDEPENDENT_AMBULATORY_CARE_PROVIDER_SITE_OTHER): Payer: Medicare Other | Admitting: General Surgery

## 2011-07-31 ENCOUNTER — Encounter (INDEPENDENT_AMBULATORY_CARE_PROVIDER_SITE_OTHER): Payer: Self-pay | Admitting: General Surgery

## 2011-07-31 ENCOUNTER — Encounter (HOSPITAL_BASED_OUTPATIENT_CLINIC_OR_DEPARTMENT_OTHER): Payer: Self-pay | Admitting: *Deleted

## 2011-07-31 VITALS — BP 126/80 | HR 118 | Temp 100.4°F | Resp 24 | Ht 70.0 in | Wt 198.4 lb

## 2011-07-31 DIAGNOSIS — B376 Candidal endocarditis: Secondary | ICD-10-CM

## 2011-07-31 DIAGNOSIS — R51 Headache: Secondary | ICD-10-CM

## 2011-07-31 HISTORY — DX: Candidal endocarditis: B37.6

## 2011-07-31 NOTE — Pre-Procedure Instructions (Signed)
ICD ORDERS FAXED TO DR [facility] OFFICE. PT HAS HAD RECENT ECHO, TEE AND BLOOD WORK ALL IN [facility]
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
## 2011-07-31 NOTE — Patient Instructions (Signed)
Do not take any Aspirin product for 5 days before the surgery.

## 2011-08-01 ENCOUNTER — Telehealth (INDEPENDENT_AMBULATORY_CARE_PROVIDER_SITE_OTHER): Payer: Self-pay

## 2011-08-01 ENCOUNTER — Encounter (HOSPITAL_COMMUNITY): Payer: Self-pay | Admitting: Pharmacy Technician

## 2011-08-01 ENCOUNTER — Encounter: Payer: Self-pay | Admitting: Internal Medicine

## 2011-08-01 ENCOUNTER — Encounter (HOSPITAL_COMMUNITY): Payer: Self-pay | Admitting: *Deleted

## 2011-08-01 NOTE — Pre-Procedure Instructions (Signed)
08/01/11 Faxed perioperative prescription for Iimplanted cardiac device programming orders to [place].  Spoke with Belenda Cruise in office.  To fax orders for surgery on 08/02/11.  Belenda Cruise stated pt's device had not been checked since 2011.   Nurse called Dr Abbey Chatters office and made [facility] aware.  She will inform Dr Abbey Chatters that device has not been checked since 2011.  Also, CBC done on 07/29/11 - platelet count of 91.  Informed Burney at Dr Abbey Chatters office. She will let Dr Abbey Chatters be aware and has phone number to call back if any questions.

## 2011-08-01 NOTE — Telephone Encounter (Signed)
[name] from pre-op called to report platelet count of 91 from labs drawn on 07/29/11.  Wanted to know if Dr. Abbey Chatters would like CBC redrawn on morning of surgery.  Also reported that pt has ICD (pacemaker).  Spoke with Dr. Abbey Chatters who ordered CBC, and would like arrangements made for defibrillator to be temporarily turned off.  Called [name] back and relayed both orders.

## 2011-08-01 NOTE — Pre-Procedure Instructions (Signed)
08/01/11 Burney at Dr Abbey Chatters office called back and stated to repeat CBC in am and to check PT.  CMET results from 07/29/11 okay for surgery. Dr Abbey Chatters stated to follow orders regarding ICD/Pacer.

## 2011-08-01 NOTE — Telephone Encounter (Signed)
Called in additional orders for a CK to be drawn on this patient.

## 2011-08-02 ENCOUNTER — Ambulatory Visit (HOSPITAL_BASED_OUTPATIENT_CLINIC_OR_DEPARTMENT_OTHER): Admission: RE | Admit: 2011-08-02 | Payer: Medicare Other | Source: Ambulatory Visit | Admitting: General Surgery

## 2011-08-02 ENCOUNTER — Encounter (HOSPITAL_COMMUNITY): Payer: Self-pay | Admitting: Anesthesiology

## 2011-08-02 ENCOUNTER — Ambulatory Visit (HOSPITAL_COMMUNITY): Payer: Medicare Other | Admitting: Anesthesiology

## 2011-08-02 ENCOUNTER — Encounter (HOSPITAL_COMMUNITY): Payer: Self-pay

## 2011-08-02 ENCOUNTER — Ambulatory Visit (INDEPENDENT_AMBULATORY_CARE_PROVIDER_SITE_OTHER): Payer: Medicare Other | Admitting: *Deleted

## 2011-08-02 ENCOUNTER — Ambulatory Visit (HOSPITAL_COMMUNITY)
Admission: RE | Admit: 2011-08-02 | Discharge: 2011-08-02 | Disposition: A | Discharge status: Home / Self Care | Payer: Medicare Other | Source: Ambulatory Visit | Attending: General Surgery | Admitting: General Surgery

## 2011-08-02 ENCOUNTER — Encounter (HOSPITAL_COMMUNITY)
Admission: RE | Disposition: A | Discharge status: Home / Self Care | Payer: Self-pay | Source: Ambulatory Visit | Attending: General Surgery

## 2011-08-02 ENCOUNTER — Other Ambulatory Visit: Payer: Self-pay

## 2011-08-02 ENCOUNTER — Encounter: Payer: Self-pay | Admitting: Internal Medicine

## 2011-08-02 DIAGNOSIS — R5381 Other malaise: Secondary | ICD-10-CM | POA: Insufficient documentation

## 2011-08-02 DIAGNOSIS — H538 Other visual disturbances: Secondary | ICD-10-CM | POA: Insufficient documentation

## 2011-08-02 DIAGNOSIS — R51 Headache: Secondary | ICD-10-CM

## 2011-08-02 DIAGNOSIS — R5383 Other fatigue: Secondary | ICD-10-CM | POA: Insufficient documentation

## 2011-08-02 DIAGNOSIS — E785 Hyperlipidemia, unspecified: Secondary | ICD-10-CM | POA: Insufficient documentation

## 2011-08-02 DIAGNOSIS — I2510 Atherosclerotic heart disease of native coronary artery without angina pectoris: Secondary | ICD-10-CM | POA: Insufficient documentation

## 2011-08-02 DIAGNOSIS — I252 Old myocardial infarction: Secondary | ICD-10-CM | POA: Insufficient documentation

## 2011-08-02 DIAGNOSIS — R059 Cough, unspecified: Secondary | ICD-10-CM | POA: Insufficient documentation

## 2011-08-02 DIAGNOSIS — R509 Fever, unspecified: Secondary | ICD-10-CM | POA: Insufficient documentation

## 2011-08-02 DIAGNOSIS — I509 Heart failure, unspecified: Secondary | ICD-10-CM

## 2011-08-02 DIAGNOSIS — I428 Other cardiomyopathies: Secondary | ICD-10-CM

## 2011-08-02 DIAGNOSIS — K219 Gastro-esophageal reflux disease without esophagitis: Secondary | ICD-10-CM | POA: Insufficient documentation

## 2011-08-02 DIAGNOSIS — R05 Cough: Secondary | ICD-10-CM | POA: Insufficient documentation

## 2011-08-02 DIAGNOSIS — Z951 Presence of aortocoronary bypass graft: Secondary | ICD-10-CM | POA: Insufficient documentation

## 2011-08-02 DIAGNOSIS — Z79899 Other long term (current) drug therapy: Secondary | ICD-10-CM | POA: Insufficient documentation

## 2011-08-02 HISTORY — PX: ARTERY BIOPSY: SHX891

## 2011-08-02 HISTORY — DX: Essential (primary) hypertension: I10

## 2011-08-02 HISTORY — DX: Acute myocardial infarction, unspecified: I21.9

## 2011-08-02 LAB — CBC
Hemoglobin: 12.7 g/dL — ABNORMAL LOW (ref 13.0–17.0)
MCV: 83.1 fL (ref 78.0–100.0)
Platelets: 84 10*3/uL — ABNORMAL LOW (ref 150–400)
RBC: 4.55 MIL/uL (ref 4.22–5.81)
WBC: 6 10*3/uL (ref 4.0–10.5)

## 2011-08-02 LAB — ICD DEVICE OBSERVATION
AL IMPEDENCE ICD: 368 Ohm
ATRIAL PACING ICD: 1 pct
CHARGE TIME: 10.67 s
DEV-0020ICD: NEGATIVE
RV LEAD IMPEDENCE ICD: 464 Ohm
TZAT-0001FASTVT: 1
TZAT-0004SLOWVT: 8
TZAT-0004SLOWVT: 8
TZAT-0005FASTVT: 88 pct
TZAT-0011FASTVT: 10 ms
TZAT-0012SLOWVT: 200 ms
TZAT-0012SLOWVT: 200 ms
TZAT-0013FASTVT: 1
TZAT-0013SLOWVT: 2
TZAT-0013SLOWVT: 2
TZAT-0018FASTVT: NEGATIVE
TZAT-0018SLOWVT: NEGATIVE
TZAT-0019FASTVT: 8 V
TZAT-0019SLOWVT: 8 V
TZAT-0020SLOWVT: 1.6 ms
TZAT-0020SLOWVT: 1.6 ms
TZON-0003SLOWVT: 340 ms
TZON-0004SLOWVT: 32
TZON-0005SLOWVT: 16
TZON-0008FASTVT: 0 ms
TZON-0008SLOWVT: 0 ms
TZST-0001FASTVT: 3
TZST-0001FASTVT: 5
TZST-0001SLOWVT: 5
TZST-0001SLOWVT: 6
TZST-0003FASTVT: 35 J
TZST-0003FASTVT: 35 J
TZST-0003SLOWVT: 25 J
TZST-0003SLOWVT: 35 J

## 2011-08-02 LAB — COMPREHENSIVE METABOLIC PANEL
ALT: 13 U/L (ref 0–53)
AST: 22 U/L (ref 0–37)
CO2: 23 mEq/L (ref 19–32)
Chloride: 103 mEq/L (ref 96–112)
Creatinine, Ser: 0.95 mg/dL (ref 0.50–1.35)
GFR calc Af Amer: >90 mL/min (ref >90)
GFR calc non Af Amer: 85 mL/min — ABNORMAL LOW (ref >90)
Glucose, Bld: 117 mg/dL — ABNORMAL HIGH (ref 70–99)
Total Bilirubin: 0.5 mg/dL (ref 0.3–1.2)

## 2011-08-02 LAB — SURGICAL PCR SCREEN
MRSA, PCR: POSITIVE — AB
Staphylococcus aureus: POSITIVE — AB

## 2011-08-02 LAB — PROTIME-INR: INR: 1.15 (ref 0.00–1.49)

## 2011-08-02 SURGERY — BIOPSY TEMPORAL ARTERY
Anesthesia: Monitor Anesthesia Care | Site: Head | Laterality: Right | Wound class: Clean

## 2011-08-02 SURGERY — BIOPSY TEMPORAL ARTERY
Anesthesia: Choice | Laterality: Right

## 2011-08-02 MED ORDER — FENTANYL CITRATE 0.05 MG/ML IJ SOLN: 25.0000 ug | INTRAMUSCULAR | Status: DC | PRN

## 2011-08-02 MED ORDER — LACTATED RINGERS IV SOLN: INTRAVENOUS | Status: DC

## 2011-08-02 MED ORDER — LACTATED RINGERS IV SOLN
INTRAVENOUS | Status: DC | PRN
  Administered 2011-08-02: 15:00:00 via INTRAVENOUS

## 2011-08-02 MED ORDER — PROPOFOL 10 MG/ML IV EMUL
INTRAVENOUS | Status: DC | PRN
  Administered 2011-08-02: 100 ug/kg/min via INTRAVENOUS

## 2011-08-02 MED ORDER — ONDANSETRON HCL 4 MG/2ML IJ SOLN
INTRAMUSCULAR | Status: DC | PRN
  Administered 2011-08-02: 4 mg via INTRAVENOUS

## 2011-08-02 MED ORDER — CEFAZOLIN SODIUM-DEXTROSE 2-3 GM-% IV SOLR
2.0000 g | INTRAVENOUS | Status: AC
  Administered 2011-08-02: 2 g via INTRAVENOUS

## 2011-08-02 MED ORDER — FENTANYL CITRATE 0.05 MG/ML IJ SOLN
INTRAMUSCULAR | Status: DC | PRN
  Administered 2011-08-02: 50 ug via INTRAVENOUS

## 2011-08-02 MED ORDER — LIDOCAINE HCL 1 % IJ SOLN
INTRAMUSCULAR | Status: AC
  Filled 2011-08-02: qty 20

## 2011-08-02 MED ORDER — MIDAZOLAM HCL 5 MG/5ML IJ SOLN
INTRAMUSCULAR | Status: DC | PRN
  Administered 2011-08-02: 2 mg via INTRAVENOUS

## 2011-08-02 MED ORDER — ACETAMINOPHEN 10 MG/ML IV SOLN
INTRAVENOUS | Status: AC
  Filled 2011-08-02: qty 100

## 2011-08-02 MED ORDER — CEFAZOLIN SODIUM-DEXTROSE 2-3 GM-% IV SOLR
INTRAVENOUS | Status: AC
  Filled 2011-08-02: qty 50

## 2011-08-02 MED ORDER — MUPIROCIN 2 % EX OINT
TOPICAL_OINTMENT | CUTANEOUS | Status: AC
  Filled 2011-08-02: qty 22

## 2011-08-02 MED ORDER — PROMETHAZINE HCL 25 MG/ML IJ SOLN: 6.2500 mg | INTRAMUSCULAR | Status: DC | PRN

## 2011-08-02 MED ORDER — ACETAMINOPHEN 10 MG/ML IV SOLN
1000.0000 mg | Freq: Four times a day (QID) | INTRAVENOUS | Status: DC
  Administered 2011-08-02: 1000 mg via INTRAVENOUS
  Filled 2011-08-02: qty 100

## 2011-08-02 MED ORDER — ACETAMINOPHEN 10 MG/ML IV SOLN
1000.0000 mg | Freq: Four times a day (QID) | INTRAVENOUS | Status: DC
  Filled 2011-08-02: qty 100

## 2011-08-02 MED ORDER — TRAMADOL HCL 50 MG PO TABS: 50.0000 mg | ORAL_TABLET | Freq: Four times a day (QID) | ORAL | Status: AC | PRN

## 2011-08-02 MED ORDER — LIDOCAINE HCL 1 % IJ SOLN
INTRAMUSCULAR | Status: DC | PRN
  Administered 2011-08-02: 3 mL

## 2011-08-02 SURGICAL SUPPLY — 42 items
APL SKNCLS STERI-STRIP NONHPOA (GAUZE/BANDAGES/DRESSINGS) ×1
ATTRACTOMAT 16X20 MAGNETIC DRP (DRAPES) ×2 IMPLANT
BENZOIN TINCTURE PRP APPL 2/3 (GAUZE/BANDAGES/DRESSINGS) ×2 IMPLANT
BLADE HEX COATED 2.75 (ELECTRODE) ×4 IMPLANT
BLADE SURG 15 STRL LF DISP TIS (BLADE) ×2 IMPLANT
BLADE SURG 15 STRL SS (BLADE) ×4
BLADE SURG SZ10 CARB STEEL (BLADE) ×4 IMPLANT
CLIP TI WIDE RED SMALL 6 (CLIP) IMPLANT
CLOSURE STERI-STRIP 1/4X4 (GAUZE/BANDAGES/DRESSINGS) ×2 IMPLANT
CLOTH BEACON ORANGE TIMEOUT ST (SAFETY) ×2 IMPLANT
DISSECTOR ROUND CHERRY 3/8 STR (MISCELLANEOUS) IMPLANT
DRAPE PED LAPAROTOMY (DRAPES) ×2 IMPLANT
ELECT NEEDLE TIP 2.8 STRL (NEEDLE) ×2 IMPLANT
ELECT REM PT RETURN 9FT ADLT (ELECTROSURGICAL) ×2
ELECTRODE REM PT RTRN 9FT ADLT (ELECTROSURGICAL) ×1 IMPLANT
GAUZE SPONGE 4X4 16PLY XRAY LF (GAUZE/BANDAGES/DRESSINGS) ×4 IMPLANT
GLOVE BIOGEL PI IND STRL 7.0 (GLOVE) ×1 IMPLANT
GLOVE BIOGEL PI INDICATOR 7.0 (GLOVE) ×1
GLOVE ECLIPSE 8.0 STRL XLNG CF (GLOVE) ×2 IMPLANT
GLOVE INDICATOR 8.0 STRL GRN (GLOVE) ×2 IMPLANT
GOWN STRL NON-REIN LRG LVL3 (GOWN DISPOSABLE) ×4 IMPLANT
GOWN STRL REIN XL XLG (GOWN DISPOSABLE) ×2 IMPLANT
HEMOSTAT SURGICEL 2X14 (HEMOSTASIS) IMPLANT
KIT BASIN OR (CUSTOM PROCEDURE TRAY) ×2 IMPLANT
NS IRRIG 1000ML POUR BTL (IV SOLUTION) ×2 IMPLANT
PACK BASIC VI WITH GOWN DISP (CUSTOM PROCEDURE TRAY) ×2 IMPLANT
PEN SKIN MARKING BROAD (MISCELLANEOUS) ×2 IMPLANT
PENCIL BUTTON HOLSTER BLD 10FT (ELECTRODE) ×2 IMPLANT
SPONGE GAUZE 4X4 12PLY (GAUZE/BANDAGES/DRESSINGS) ×2 IMPLANT
STAPLER VISISTAT 35W (STAPLE) ×2 IMPLANT
STRIP CLOSURE SKIN 1/2X4 (GAUZE/BANDAGES/DRESSINGS) IMPLANT
SUT SILK 2 0 (SUTURE) ×1
SUT SILK 2-0 18XBRD TIE 12 (SUTURE) ×1 IMPLANT
SUT SILK 3 0 (SUTURE)
SUT SILK 3-0 18XBRD TIE 12 (SUTURE) IMPLANT
SUT VIC AB 3-0 SH 27 (SUTURE)
SUT VIC AB 3-0 SH 27XBRD (SUTURE) IMPLANT
SUT VIC AB 4-0 PS2 27 (SUTURE) ×2 IMPLANT
SYR BULB IRRIGATION 50ML (SYRINGE) IMPLANT
TOWEL OR 17X26 10 PK STRL BLUE (TOWEL DISPOSABLE) ×4 IMPLANT
WATER STERILE IRR 1500ML POUR (IV SOLUTION) IMPLANT
YANKAUER SUCT BULB TIP 10FT TU (MISCELLANEOUS) ×2 IMPLANT

## 2011-08-02 NOTE — Progress Notes (Signed)
icd check in hospital presurgical/device at [facility] on 05-25-11

## 2011-08-02 NOTE — Discharge Instructions (Signed)
Keep bandage on and dry for 2 days. Remove bandage on Sunday. May get area wet Monday.  Call for heavy bleeding or signs of infection or other wound problems.  Apply ice to the area for 24 hours.  Follow up appointment in 3 weeks. Please call the office to make this appointment at [phone].  Light activities until Sunday then began activities as tolerated.

## 2011-08-02 NOTE — Progress Notes (Signed)
[employer] Rep Marthann Schiller called and reproted all leads in expected range.  Batteries need to be changed within three months of 2/23.   Pt aware.
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
## 2011-08-02 NOTE — Transfer of Care (Signed)
Immediate Anesthesia Transfer of Care Note  Patient: [name]  Procedure(s) Performed: Procedure(s) (LRB): BIOPSY TEMPORAL ARTERY (Right)  Patient Location: PACU  Anesthesia Type: General  Level of Consciousness: awake, alert , oriented and patient cooperative  Airway & Oxygen Therapy: Patient Spontanous Breathing and Patient connected to face mask oxygen  Post-op Assessment: Report given to PACU RN, Post -op Vital signs reviewed and stable and Patient moving all extremities X 4  Post vital signs: Reviewed and stable  Complications: No apparent anesthesia complications

## 2011-08-02 NOTE — Anesthesia Postprocedure Evaluation (Signed)
Anesthesia Post Note  Patient: [name]  Procedure(s) Performed: Procedure(s) (LRB): BIOPSY TEMPORAL ARTERY (Right)  Anesthesia type: General  Patient location: PACU  Post pain: Pain level controlled  Post assessment: Post-op Vital signs reviewed  Last Vitals:  Filed Vitals:   08/02/11 1715  BP:   Pulse: 81  Temp:   Resp: 16    Post vital signs: Reviewed  Level of consciousness: sedated  Complications: No apparent anesthesia complications

## 2011-08-02 NOTE — Interval H&P Note (Signed)
History and Physical Interval Note:  08/02/2011 3:14 PM  [name]  has presented today for surgery, with the diagnosis of right side headaches   The various methods of treatment have been discussed with the patient and family. After consideration of risks, benefits and other options for treatment, the patient has consented to  Procedure(s) (LRB): BIOPSY TEMPORAL ARTERY (Right) as a surgical intervention .  The patients' history has been reviewed, patient examined, no change in status, stable for surgery.  I have reviewed the patients' chart and labs.  Questions were answered to the patient's satisfaction.     [name] Shela Commons

## 2011-08-02 NOTE — Preoperative (Signed)
Beta Blockers   Reason not to administer Beta Blockers:Not Applicable 

## 2011-08-02 NOTE — Op Note (Signed)
Operative Note  [name] male 66 y.o. 08/02/2011  PREOPERATIVE DX:  Fever of unknown origin with right-sided headaches and visual changes  POSTOPERATIVE DX:  Same  PROCEDURE:Right temporal artery biopsy         Surgeon: Adolph Pollack   Assistants: None  Anesthesia: Monitored Local Anesthesia with Sedation  Indications:   This is a 66 year old man with a fever of unknown origin. He has developed right-sided headaches and some visual changes. There is a concern for temporal arteritis. He now presents for right temporal artery biopsy.    Procedure Detail:  He was seen in the holding area in the right forehead marked with my initials.  He was brought to the operating room and placed supine on the operating table. He was given intravenous sedation. The hair in the right temporal area was clipped and the area sterilely prepped and draped. 1% plain Xylocaine was infiltrated in the preauricular area up toward the right forehead. An incision was made through the skin and subcutaneous tissue. Using blunt dissection identified a small pulsatile vessel consistent with the right temporal artery. I dissected this out for a length of about 2.5 cm. I then clamped the proximal and  distal portions and cut the artery free and sent to pathology. The proximal distal portions were ligated with 4-0 Vicryl.  The wound was inspected and hemostasis was adequate. I then closed subcutaneous tissues running 4 Monocryl stitch. The skin was closed with a running 4 Monocryl subcuticular stitch. Steri-Strips and a sterile dressing were applied.  He tolerated the procedure well without any apparent complications and was taken to the recovery in satisfactory condition.    Estimated Blood Loss:  Minimal         Drains: none          Blood Given: none          Specimens: Segment of right temporal artery        Complications:  * No complications entered in OR log *         Disposition: PACU -  hemodynamically stable.         Condition: stable

## 2011-08-02 NOTE — Anesthesia Preprocedure Evaluation (Addendum)
Anesthesia Evaluation  Patient identified by MRN, date of birth, ID band Patient awake    Reviewed: Allergy & Precautions, H&P , NPO status , Patient's Chart, lab work & pertinent test results, reviewed documented beta blocker date and time   Airway Mallampati: II TM Distance: >3 FB     Dental  (+) Edentulous Upper and Edentulous Lower   Pulmonary shortness of breath and with exertion, Recent URI ,  breath sounds clear to auscultation  Pulmonary exam normal       Cardiovascular hypertension, Pt. on medications and Pt. on home beta blockers + CAD, + Past MI (MI 2003), + CABG (CABG X4 2003) and +CHF (EF 35% in 2009) + dysrhythmias + pacemaker + Cardiac Defibrillator + Valvular Problems/Murmurs Rhythm:Regular Rate:Normal  EKG: informed by AICD rep that pacemaker is not failed but will need new generator in near future.    Neuro/Psych  Headaches, negative psych ROS   GI/Hepatic negative GI ROS, Neg liver ROS, Bowel prep,GERD-  Medicated,  Endo/Other  negative endocrine ROS  Renal/GU negative Renal ROS  negative genitourinary   Musculoskeletal negative musculoskeletal ROS (+)   Abdominal   Peds negative pediatric ROS (+)  Hematology negative hematology ROS (+)   Anesthesia Other Findings   Reproductive/Obstetrics negative OB ROS                           Anesthesia Physical Anesthesia Plan  ASA: III  Anesthesia Plan: MAC   Post-op Pain Management:    Induction:   Airway Management Planned: Nasal Cannula  Additional Equipment:   Intra-op Plan:   Post-operative Plan:   Informed Consent:   Dental advisory given  Plan Discussed with: CRNA  Anesthesia Plan Comments:         Anesthesia Quick Evaluation

## 2011-08-05 ENCOUNTER — Encounter (HOSPITAL_COMMUNITY): Payer: Self-pay | Admitting: General Surgery

## 2011-08-05 LAB — CULTURE, BLOOD (SINGLE)
Organism ID, Bacteria: NO GROWTH
Organism ID, Bacteria: NO GROWTH

## 2011-08-06 ENCOUNTER — Telehealth: Payer: Self-pay | Admitting: *Deleted

## 2011-08-06 ENCOUNTER — Telehealth (INDEPENDENT_AMBULATORY_CARE_PROVIDER_SITE_OTHER): Payer: Self-pay | Admitting: General Surgery

## 2011-08-06 LAB — CK ISOENZYMES: CK-BB: 0 %

## 2011-08-06 NOTE — Telephone Encounter (Signed)
Patient wife called and advised that the patient has been running a very high fever (104.9) and is very weak. She advised that he was taken to the hospital on Friday evening and that she was just called and given an appt to come see Dr Orvan Falconer on 08/08/11. She reports he is really sick and she is worried that he may die before they find out what is wrong with him. Advised her that if his fever goes back up before his visit to call and if it is after hours to go directly to the hospital. She was in agreement and will see [place] on Thursday.

## 2011-08-06 NOTE — Telephone Encounter (Signed)
His right temporal artery biopsy is normal and his CK level is normal.  This was explained to him.

## 2011-08-07 ENCOUNTER — Telehealth: Payer: Self-pay | Admitting: Cardiovascular Disease

## 2011-08-07 NOTE — Telephone Encounter (Signed)
Dr. Madilyn Fireman wants to have a TEE done on pt next week and wants to speak to Dr. Elease Hashimoto about getting this set up

## 2011-08-07 NOTE — Telephone Encounter (Signed)
[name] is a 66 year old gentleman whose been having significant fevers. He raises chickens. His temperature has been between 103 and 104 on occasion.   We've tried to do a transesophageal echo but was not able to pass the probe. He has a stricture in his midesophagus. He has known esophageal strictures and has had esophageal dilatation in the past. I did not push the probe very hard out of fear of an esophageal rupture.  A barium swallow revealed a significant but long in fusiform esophageal stenosis. The 13 mm barium tablet would not pass down the esophagus.  I talked with Dr. Dorena Cookey today.  He will schedule an upper endoscopy said he can get a better look at the esophagus.  We will arrange for him to have a transesophageal echo immediately after his endoscopy. We'll need to look for vegetations.  [name] is to continue his evaluation with the infectious disease doctor.  To My knowledge, no blood cultures have shown any growth.

## 2011-08-07 NOTE — Telephone Encounter (Signed)
DR [facility] WILL CALL THE DR

## 2011-08-08 ENCOUNTER — Inpatient Hospital Stay (HOSPITAL_COMMUNITY)
Admission: AD | Admit: 2011-08-08 | Discharge: 2011-08-23 | DRG: 260 | Disposition: A | Discharge status: Home Health | Payer: Medicare Other | Source: Ambulatory Visit | Attending: Internal Medicine | Admitting: Internal Medicine

## 2011-08-08 ENCOUNTER — Encounter (HOSPITAL_COMMUNITY): Payer: Self-pay | Admitting: Internal Medicine

## 2011-08-08 ENCOUNTER — Observation Stay (HOSPITAL_COMMUNITY): Payer: Medicare Other

## 2011-08-08 ENCOUNTER — Encounter: Payer: Self-pay | Admitting: Internal Medicine

## 2011-08-08 ENCOUNTER — Other Ambulatory Visit: Payer: Self-pay

## 2011-08-08 ENCOUNTER — Ambulatory Visit (INDEPENDENT_AMBULATORY_CARE_PROVIDER_SITE_OTHER): Payer: Medicare Other | Admitting: Internal Medicine

## 2011-08-08 VITALS — BP 111/80 | HR 130 | Temp 98.4°F | Ht 70.0 in | Wt 189.5 lb

## 2011-08-08 DIAGNOSIS — R634 Abnormal weight loss: Secondary | ICD-10-CM | POA: Diagnosis present

## 2011-08-08 DIAGNOSIS — B377 Candidal sepsis: Secondary | ICD-10-CM | POA: Diagnosis not present

## 2011-08-08 DIAGNOSIS — R6889 Other general symptoms and signs: Secondary | ICD-10-CM

## 2011-08-08 DIAGNOSIS — K219 Gastro-esophageal reflux disease without esophagitis: Secondary | ICD-10-CM | POA: Diagnosis present

## 2011-08-08 DIAGNOSIS — D696 Thrombocytopenia, unspecified: Secondary | ICD-10-CM

## 2011-08-08 DIAGNOSIS — I2589 Other forms of chronic ischemic heart disease: Secondary | ICD-10-CM | POA: Diagnosis present

## 2011-08-08 DIAGNOSIS — R5381 Other malaise: Secondary | ICD-10-CM | POA: Diagnosis not present

## 2011-08-08 DIAGNOSIS — Y92009 Unspecified place in unspecified non-institutional (private) residence as the place of occurrence of the external cause: Secondary | ICD-10-CM

## 2011-08-08 DIAGNOSIS — Y831 Surgical operation with implant of artificial internal device as the cause of abnormal reaction of the patient, or of later complication, without mention of misadventure at the time of the procedure: Secondary | ICD-10-CM | POA: Diagnosis present

## 2011-08-08 DIAGNOSIS — B3781 Candidal esophagitis: Secondary | ICD-10-CM | POA: Diagnosis present

## 2011-08-08 DIAGNOSIS — R509 Fever, unspecified: Secondary | ICD-10-CM

## 2011-08-08 DIAGNOSIS — Z951 Presence of aortocoronary bypass graft: Secondary | ICD-10-CM

## 2011-08-08 DIAGNOSIS — I509 Heart failure, unspecified: Secondary | ICD-10-CM | POA: Diagnosis present

## 2011-08-08 DIAGNOSIS — J9600 Acute respiratory failure, unspecified whether with hypoxia or hypercapnia: Secondary | ICD-10-CM | POA: Diagnosis not present

## 2011-08-08 DIAGNOSIS — K5900 Constipation, unspecified: Secondary | ICD-10-CM | POA: Diagnosis present

## 2011-08-08 DIAGNOSIS — I2690 Septic pulmonary embolism without acute cor pulmonale: Secondary | ICD-10-CM | POA: Diagnosis not present

## 2011-08-08 DIAGNOSIS — I5023 Acute on chronic systolic (congestive) heart failure: Secondary | ICD-10-CM | POA: Diagnosis not present

## 2011-08-08 DIAGNOSIS — I501 Left ventricular failure: Secondary | ICD-10-CM

## 2011-08-08 DIAGNOSIS — I10 Essential (primary) hypertension: Secondary | ICD-10-CM | POA: Diagnosis present

## 2011-08-08 DIAGNOSIS — Z823 Family history of stroke: Secondary | ICD-10-CM

## 2011-08-08 DIAGNOSIS — Z87442 Personal history of urinary calculi: Secondary | ICD-10-CM

## 2011-08-08 DIAGNOSIS — R911 Solitary pulmonary nodule: Secondary | ICD-10-CM | POA: Diagnosis present

## 2011-08-08 DIAGNOSIS — D649 Anemia, unspecified: Secondary | ICD-10-CM | POA: Diagnosis present

## 2011-08-08 DIAGNOSIS — F411 Generalized anxiety disorder: Secondary | ICD-10-CM | POA: Diagnosis not present

## 2011-08-08 DIAGNOSIS — B376 Candidal endocarditis: Secondary | ICD-10-CM

## 2011-08-08 DIAGNOSIS — I339 Acute and subacute endocarditis, unspecified: Secondary | ICD-10-CM

## 2011-08-08 DIAGNOSIS — H3581 Retinal edema: Secondary | ICD-10-CM

## 2011-08-08 DIAGNOSIS — D472 Monoclonal gammopathy: Secondary | ICD-10-CM | POA: Diagnosis present

## 2011-08-08 DIAGNOSIS — E871 Hypo-osmolality and hyponatremia: Secondary | ICD-10-CM | POA: Diagnosis not present

## 2011-08-08 DIAGNOSIS — Z95810 Presence of automatic (implantable) cardiac defibrillator: Secondary | ICD-10-CM

## 2011-08-08 DIAGNOSIS — K222 Esophageal obstruction: Secondary | ICD-10-CM

## 2011-08-08 DIAGNOSIS — R6883 Chills (without fever): Secondary | ICD-10-CM | POA: Diagnosis present

## 2011-08-08 DIAGNOSIS — I2510 Atherosclerotic heart disease of native coronary artery without angina pectoris: Secondary | ICD-10-CM

## 2011-08-08 DIAGNOSIS — R6521 Severe sepsis with septic shock: Secondary | ICD-10-CM | POA: Diagnosis not present

## 2011-08-08 DIAGNOSIS — I252 Old myocardial infarction: Secondary | ICD-10-CM

## 2011-08-08 DIAGNOSIS — R0902 Hypoxemia: Secondary | ICD-10-CM | POA: Diagnosis not present

## 2011-08-08 DIAGNOSIS — R1310 Dysphagia, unspecified: Secondary | ICD-10-CM | POA: Diagnosis present

## 2011-08-08 DIAGNOSIS — I39 Endocarditis and heart valve disorders in diseases classified elsewhere: Secondary | ICD-10-CM

## 2011-08-08 DIAGNOSIS — R6520 Severe sepsis without septic shock: Secondary | ICD-10-CM | POA: Diagnosis not present

## 2011-08-08 DIAGNOSIS — T827XXA Infection and inflammatory reaction due to other cardiac and vascular devices, implants and grafts, initial encounter: Principal | ICD-10-CM | POA: Diagnosis present

## 2011-08-08 DIAGNOSIS — I5022 Chronic systolic (congestive) heart failure: Secondary | ICD-10-CM

## 2011-08-08 DIAGNOSIS — I447 Left bundle-branch block, unspecified: Secondary | ICD-10-CM

## 2011-08-08 DIAGNOSIS — K130 Diseases of lips: Secondary | ICD-10-CM | POA: Diagnosis present

## 2011-08-08 DIAGNOSIS — I76 Septic arterial embolism: Secondary | ICD-10-CM

## 2011-08-08 DIAGNOSIS — Z87891 Personal history of nicotine dependence: Secondary | ICD-10-CM

## 2011-08-08 DIAGNOSIS — A419 Sepsis, unspecified organism: Secondary | ICD-10-CM

## 2011-08-08 DIAGNOSIS — R918 Other nonspecific abnormal finding of lung field: Secondary | ICD-10-CM

## 2011-08-08 DIAGNOSIS — E785 Hyperlipidemia, unspecified: Secondary | ICD-10-CM | POA: Diagnosis present

## 2011-08-08 DIAGNOSIS — Z8249 Family history of ischemic heart disease and other diseases of the circulatory system: Secondary | ICD-10-CM

## 2011-08-08 DIAGNOSIS — Z7982 Long term (current) use of aspirin: Secondary | ICD-10-CM

## 2011-08-08 DIAGNOSIS — J9601 Acute respiratory failure with hypoxia: Secondary | ICD-10-CM

## 2011-08-08 HISTORY — DX: Esophageal obstruction: K22.2

## 2011-08-08 LAB — DIFFERENTIAL
Basophils Absolute: 0 10*3/uL (ref 0.0–0.1)
Basophils Relative: 0 % (ref 0–1)
Lymphocytes Relative: 28 % (ref 12–46)
Neutro Abs: 3.6 10*3/uL (ref 1.7–7.7)
Neutrophils Relative %: 63 % (ref 43–77)

## 2011-08-08 LAB — URINALYSIS, ROUTINE W REFLEX MICROSCOPIC
Glucose, UA: NEGATIVE mg/dL
Leukocytes, UA: NEGATIVE
Protein, ur: NEGATIVE mg/dL
Specific Gravity, Urine: 1.026 (ref 1.005–1.030)
Urobilinogen, UA: 1 mg/dL (ref 0.0–1.0)

## 2011-08-08 LAB — CBC
HCT: 35.9 % — ABNORMAL LOW (ref 39.0–52.0)
MCV: 82.3 fL (ref 78.0–100.0)
RDW: 14.8 % (ref 11.5–15.5)
WBC: 5.7 10*3/uL (ref 4.0–10.5)

## 2011-08-08 LAB — COMPREHENSIVE METABOLIC PANEL
Albumin: 2.9 g/dL — ABNORMAL LOW (ref 3.5–5.2)
Alkaline Phosphatase: 72 U/L (ref 39–117)
BUN: 15 mg/dL (ref 6–23)
Creatinine, Ser: 0.92 mg/dL (ref 0.50–1.35)
GFR calc Af Amer: >90 mL/min (ref >90)
Glucose, Bld: 110 mg/dL — ABNORMAL HIGH (ref 70–99)
Potassium: 3.9 mEq/L (ref 3.5–5.1)
Total Protein: 7.9 g/dL (ref 6.0–8.3)

## 2011-08-08 LAB — CARDIAC PANEL(CRET KIN+CKTOT+MB+TROPI)
CK, MB: 1.2 ng/mL (ref 0.3–4.0)
CK, MB: 1.1 ng/mL (ref 0.3–4.0)
Troponin I: <0.3 ng/mL (ref <0.30)
Troponin I: <0.3 ng/mL (ref <0.30)

## 2011-08-08 LAB — PRO B NATRIURETIC PEPTIDE: Pro B Natriuretic peptide (BNP): 522.1 pg/mL — ABNORMAL HIGH (ref 0–125)

## 2011-08-08 LAB — SEDIMENTATION RATE: Sed Rate: 57 mm/hr — ABNORMAL HIGH (ref 0–16)

## 2011-08-08 MED ORDER — ONDANSETRON HCL 4 MG PO TABS: 4.0000 mg | ORAL_TABLET | Freq: Four times a day (QID) | ORAL | Status: DC | PRN

## 2011-08-08 MED ORDER — METOPROLOL TARTRATE 25 MG PO TABS
25.0000 mg | ORAL_TABLET | Freq: Every day | ORAL | Status: DC
  Administered 2011-08-08 – 2011-08-09 (×2): 25 mg via ORAL
  Filled 2011-08-08 (×2): qty 1

## 2011-08-08 MED ORDER — ACETAMINOPHEN 650 MG RE SUPP: 650.0000 mg | Freq: Four times a day (QID) | RECTAL | Status: DC | PRN

## 2011-08-08 MED ORDER — DIPHENHYDRAMINE HCL 25 MG PO CAPS
25.0000 mg | ORAL_CAPSULE | Freq: Every evening | ORAL | Status: DC | PRN
  Administered 2011-08-08 – 2011-08-14 (×8): 25 mg via ORAL
  Filled 2011-08-08 (×7): qty 1

## 2011-08-08 MED ORDER — TRAMADOL HCL 50 MG PO TABS
50.0000 mg | ORAL_TABLET | Freq: Four times a day (QID) | ORAL | Status: DC | PRN
  Administered 2011-08-08: 50 mg via ORAL
  Filled 2011-08-08 (×2): qty 1

## 2011-08-08 MED ORDER — ADULT MULTIVITAMIN W/MINERALS CH
1.0000 | ORAL_TABLET | Freq: Every day | ORAL | Status: DC
  Administered 2011-08-09 – 2011-08-23 (×14): 1 via ORAL
  Filled 2011-08-08 (×16): qty 1

## 2011-08-08 MED ORDER — ACETAMINOPHEN 325 MG PO TABS: 650.0000 mg | ORAL_TABLET | Freq: Four times a day (QID) | ORAL | Status: DC | PRN

## 2011-08-08 MED ORDER — SODIUM CHLORIDE 0.9 % IV SOLN: INTRAVENOUS | Status: DC

## 2011-08-08 MED ORDER — CALCIUM CARBONATE ANTACID 500 MG PO CHEW
1.0000 | CHEWABLE_TABLET | Freq: Three times a day (TID) | ORAL | Status: DC
  Administered 2011-08-08 – 2011-08-15 (×18): 200 mg via ORAL
  Filled 2011-08-08 (×24): qty 1

## 2011-08-08 MED ORDER — PANTOPRAZOLE SODIUM 40 MG PO TBEC
40.0000 mg | DELAYED_RELEASE_TABLET | Freq: Every day | ORAL | Status: DC
  Administered 2011-08-08 – 2011-08-14 (×7): 40 mg via ORAL
  Filled 2011-08-08 (×5): qty 1

## 2011-08-08 MED ORDER — ONDANSETRON HCL 4 MG/2ML IJ SOLN: 4.0000 mg | Freq: Four times a day (QID) | INTRAMUSCULAR | Status: DC | PRN

## 2011-08-08 MED ORDER — HEPARIN SODIUM (PORCINE) 5000 UNIT/ML IJ SOLN
5000.0000 [IU] | Freq: Three times a day (TID) | INTRAMUSCULAR | Status: DC
  Administered 2011-08-08: 5000 [IU] via SUBCUTANEOUS
  Filled 2011-08-08 (×3): qty 1

## 2011-08-08 MED ORDER — THERA VITAL M PO TABS: 1.0000 | ORAL_TABLET | Freq: Every day | ORAL | Status: DC

## 2011-08-08 NOTE — Progress Notes (Signed)
Patient ID: [name], male   DOB: Mar 25, 1946, 66 y.o.   MRN: [id number]  INFECTIOUS DISEASE PROGRESS NOTE    Patient Active Problem List  Diagnoses  . CARDIOMYOPATHY, ISCHEMIC  . LBBB  . CONGESTIVE HEART FAILURE, LEFT  . ICD - IN SITU  . CAD (coronary artery disease)  . FUO (fever of unknown origin)  . Weight loss, unintentional  . Normocytic anemia  . Esophageal stricture  . Thrombocytopenia     Subjective: Mr. Staffa is in for his routine followup visit with his wife and son. All of them confirm that he is feeling much worse over the past week. He states that he's had consistently higher fevers and his temperature was 104.9 on Saturday and he was taken to the emergency room at his local hospital in [place]. He was given IV fluids and sent home. He is feeling much weaker and has trouble standing and walking. He is complaining of increased aching pain in both hips and mentioned the pain in one of his shoulders to his son this morning. He cannot remember which one and states that he is so tired that his memory has been worse. He denies any headache. He has also had some persistent pain in his right jaw that is worse when chewing food and opening his mouth widely. That started within the past week. He has had no visual changes. He is not sleeping well because of the fever, chills and sweats at night. He was too weak to stand on the scales today but he and his wife are certain that he has continued to lose weight. He states that he has no appetite. He is having more problems with acid indigestion and swallowing. He had one episode of nausea and vomiting that he relates to being triggered by the smell of shampoo in the shower. He is scheduled for repeat upper endoscopy next week by Dr. Madilyn Fireman and Dr. Melburn Popper. He has had some constipation.  Objective: Temp: 98.4 F (36.9 C) (05/09 1114) Temp src: Oral (05/09 1114) BP: 111/80 mmHg (05/09 1114) Pulse Rate: 130  (05/09 1114)  General: He  looks much weaker. He is pale. Skin: No rash or palpable adenopathy. His right temporal artery biopsy site appears normal. Oral: There is no change in the chronic chelosis. I see no oral or pharyngeal lesions Lungs: Clear Cor: Regular S1 and S2 with no murmurs. His left upper chest ICD site is normal Abdomen: Abdomen is slightly distended soft and nontender Joints: No change in the chronic scarring of his left-hand. No acute inflammation of his other joints.  Lab Results FINAL DIAGNOSIS Diagnosis Artery, temporal, biopsy, right superficial - BENIGN TEMPORAL ARTERY, SEE COMMENT. - NEGATIVE FOR ARTERITIS.   Assessment: His FUO persisted and he is having more rapid clinical deterioration and weight loss. He's had extensive outpatient evaluation but so far has not led to any obvious cause. He has had 2 sets of blood cultures that have been negative. CT scan of his chest, abdomen and pelvis failed to show any acute abnormality that could explain his fevers. He has developed normocytic anemia and thrombocytopenia and his sedimentation rate and C-reactive protein are elevated but all of these are nonspecific. His recent temporal artery biopsy was negative. Some of his symptoms still suggests the possibility of polymyalgia rheumatica or temporal arteritis. I think at this point it is best that he be admitted to the hospital for observation and further diagnostic evaluation. He is in agreement with that plan.  Plan: 1. Admit to the hospital today 2. Continue observation off of antibiotics 3. In for Dr. Madilyn Fireman (GI) and Dr. Melburn Popper (cardiology) of his admission 4. I will asked my partner, Dr. Enedina Finner, to evaluate him in the hospital   [name], MD [facility] [phone] pager   [phone] cell 08/08/2011, 12:10 PM

## 2011-08-08 NOTE — Progress Notes (Signed)
Case discussed with medical team and the patient's chart was reviewed and appointment on the 15th is confirmed on the endoscopy schedule. I will ask Dr. Madilyn Fireman who is followed him for years to see him on Saturday and I will be on standby to help sooner when necessary please call me if I can be of any further assistance and I'm sure if cardiology can move up the procedure than we can proceed with endoscopy and dilatation prior and call Dr. Madilyn Fireman next week to set that up
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
## 2011-08-08 NOTE — Progress Notes (Signed)
I spoke with Dr Dorena Cookey office about scheduling TEE per Dr Harvie Bridge request. Dr Madilyn Fireman will preform an upper endoscopy on Wed Aug 14, 2011 @ 9 am.  Dr Shirlee Latch will perform TEE to follow @ 10 am. The TEE is for fever of unknown origin in a patient with a device in place. TEE was attempted at an earlier date by Dr Elease Hashimoto but was unable to pass the probe. So, TEE is now scheduled to follow GI procedure.

## 2011-08-09 ENCOUNTER — Encounter (HOSPITAL_COMMUNITY): Payer: Self-pay | Admitting: *Deleted

## 2011-08-09 DIAGNOSIS — I2510 Atherosclerotic heart disease of native coronary artery without angina pectoris: Secondary | ICD-10-CM

## 2011-08-09 DIAGNOSIS — R509 Fever, unspecified: Secondary | ICD-10-CM

## 2011-08-09 DIAGNOSIS — K219 Gastro-esophageal reflux disease without esophagitis: Secondary | ICD-10-CM

## 2011-08-09 DIAGNOSIS — Z95810 Presence of automatic (implantable) cardiac defibrillator: Secondary | ICD-10-CM

## 2011-08-09 LAB — LIPID PANEL
LDL Cholesterol: 78 mg/dL (ref 0–99)
Triglycerides: 221 mg/dL — ABNORMAL HIGH (ref <150)
VLDL: 44 mg/dL — ABNORMAL HIGH (ref 0–40)

## 2011-08-09 LAB — URIC ACID: Uric Acid, Serum: 5.5 mg/dL (ref 4.0–7.8)

## 2011-08-09 LAB — TSH: TSH: 1.523 u[IU]/mL (ref 0.350–4.500)

## 2011-08-09 LAB — CARDIAC PANEL(CRET KIN+CKTOT+MB+TROPI)
CK, MB: 1.2 ng/mL (ref 0.3–4.0)
Relative Index: INVALID (ref 0.0–2.5)
Total CK: 14 U/L (ref 7–232)

## 2011-08-09 LAB — CBC
MCH: 27.4 pg (ref 26.0–34.0)
Platelets: 87 10*3/uL — ABNORMAL LOW (ref 150–400)
RBC: 4.38 MIL/uL (ref 4.22–5.81)
WBC: 5.8 10*3/uL (ref 4.0–10.5)

## 2011-08-09 LAB — C-REACTIVE PROTEIN: CRP: 9.69 mg/dL — ABNORMAL HIGH (ref <0.60)

## 2011-08-09 LAB — RETICULOCYTES
RBC.: 4.08 MIL/uL — ABNORMAL LOW (ref 4.22–5.81)
Retic Ct Pct: 1.9 % (ref 0.4–3.1)

## 2011-08-09 LAB — CRYPTOCOCCAL ANTIGEN: Crypto Ag: NEGATIVE

## 2011-08-09 LAB — BASIC METABOLIC PANEL
Calcium: 9.4 mg/dL (ref 8.4–10.5)
GFR calc Af Amer: 84 mL/min — ABNORMAL LOW (ref >90)
GFR calc non Af Amer: 73 mL/min — ABNORMAL LOW (ref >90)
Sodium: 134 mEq/L — ABNORMAL LOW (ref 135–145)

## 2011-08-09 MED ORDER — METOPROLOL TARTRATE 25 MG PO TABS
25.0000 mg | ORAL_TABLET | Freq: Two times a day (BID) | ORAL | Status: DC
  Administered 2011-08-09 – 2011-08-23 (×26): 25 mg via ORAL
  Filled 2011-08-09 (×30): qty 1

## 2011-08-09 MED ORDER — ENSURE COMPLETE PO LIQD
237.0000 mL | Freq: Two times a day (BID) | ORAL | Status: DC
  Administered 2011-08-09 – 2011-08-19 (×17): 237 mL via ORAL

## 2011-08-09 MED ORDER — DOCUSATE SODIUM 100 MG PO CAPS
100.0000 mg | ORAL_CAPSULE | Freq: Every day | ORAL | Status: DC | PRN
  Administered 2011-08-09 – 2011-08-10 (×2): 100 mg via ORAL
  Filled 2011-08-09 (×2): qty 1

## 2011-08-09 MED ORDER — ENSURE PUDDING PO PUDG
1.0000 | Freq: Three times a day (TID) | ORAL | Status: DC
  Administered 2011-08-09 – 2011-08-11 (×8): 1 via ORAL

## 2011-08-09 MED ORDER — ASPIRIN EC 81 MG PO TBEC
81.0000 mg | DELAYED_RELEASE_TABLET | Freq: Every day | ORAL | Status: DC
  Administered 2011-08-09 – 2011-08-23 (×15): 81 mg via ORAL
  Filled 2011-08-09 (×15): qty 1

## 2011-08-09 NOTE — Progress Notes (Signed)
Patient's temp 100.9 rectal, Dr. Ninetta Lights notified. Steele Berg RN
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
## 2011-08-09 NOTE — Progress Notes (Signed)
Pt states he got hot and sweaty and then cold. Temp taken orally 99.0

## 2011-08-09 NOTE — Progress Notes (Signed)
INTERNAL MEDICINE DAILY PROGRESS NOTE  [name] MRN: [id number] DOB/AGE: 1945/10/02 66 y.o.  Admit date: 08/08/2011  Subjective: [name] states he is feeling generally well today. He has had subjective fevers overnight and today, but no temperatures taken during these events have not shown true fever. Patient is adamant that he has had measured fevers (up to 104 even), with a thermometer. States feels indigestion is bad today. Denies pain but family states his hips were hurting earlier.   Denies headache, nausea, vomiting, chest pain, shortness of breath,  abdominal pain, swelling or other complaint.  Objective: Vital signs in last 24 hours: Filed Vitals:   08/09/11 1115 08/09/11 1205 08/09/11 1300 08/09/11 1345  BP:    121/61  Pulse:    80  Temp: 99.9 F (37.7 C) 98.5 F (36.9 C) 99 F (37.2 C) 99 F (37.2 C)  TempSrc:   Oral Oral  Resp:    20  Weight:      SpO2:    100%   Weight change:   Intake/Output Summary (Last 24 hours) at 08/09/11 1635 Last data filed at 08/09/11 1350  Gross per 24 hour  Intake   1060 ml  Output      0 ml  Net   1060 ml   Physical Exam: General: resting in bed HEENT: PERRL, EOMI, no scleral icterus Cardiac: tachycardic with some irregular beats, no rubs, murmurs or gallops Pulm: Inspiratory crackles persist in bil bases. Clear to auscultation bilaterally, moving normal volumes of air Abd: soft, nontender, nondistended, BS present Ext: warm and well perfused, no pedal edema Neuro: alert and oriented X3, cranial nerves II-XII grossly intact Lab Results: Basic Metabolic Panel:  Lab 08/09/11 5284 08/08/11 1645  NA 134* 135  K 5.0 3.9  CL 99 99  CO2 26 23  GLUCOSE 100* 110*  BUN 14 15  CREATININE 1.05 0.92  CALCIUM 9.4 9.4  MG -- --  PHOS -- --   Liver Function Tests:  Lab 08/08/11 1645  AST 32  ALT 23  ALKPHOS 72  BILITOT 0.3  PROT 7.9  ALBUMIN 2.9*   CBC:  Lab 08/09/11 0735 08/08/11 1645  WBC 5.8 5.7    NEUTROABS -- 3.6  HGB 12.0* 12.1*  HCT 36.1* 35.9*  MCV 82.4 82.3  PLT 87* 89*   Cardiac Enzymes:  Lab 08/09/11 0735 08/08/11 2126 08/08/11 1645  CKTOTAL 14 20 18   CKMB 1.2 1.1 1.2  CKMBINDEX -- -- --  TROPONINI <0.30 <0.30 <0.30   BNP:  Lab 08/08/11 1645  PROBNP 522.1*   Fasting Lipid Panel:  Lab 08/09/11 0735  CHOL 138  HDL 16*  LDLCALC 78  TRIG 132*  CHOLHDL 8.6  LDLDIRECT --   Thyroid Function Tests:  Lab 08/08/11 1645  TSH 1.523  T4TOTAL --  FREET4 --  T3FREE --  THYROIDAB --   Urinalysis:  Lab 08/08/11 1646  COLORURINE AMBER*  LABSPEC 1.026  PHURINE 5.5  GLUCOSEU NEGATIVE  HGBUR NEGATIVE  BILIRUBINUR SMALL*  KETONESUR NEGATIVE  PROTEINUR NEGATIVE  UROBILINOGEN 1.0  NITRITE NEGATIVE  LEUKOCYTESUR NEGATIVE   Misc. Labs: C-reactive protein is  LDH is elevated at 352  Micro Results: Recent Results (from the past 240 hour(s))  SURGICAL PCR SCREEN     Status: Abnormal   Collection Time   08/02/11 11:56 AM      Component Value Range Status Comment   MRSA, PCR POSITIVE (*) NEGATIVE  Final    Staphylococcus aureus POSITIVE (*)  NEGATIVE  Final   MRSA PCR SCREENING     Status: Normal   Collection Time   08/08/11  9:28 PM      Component Value Range Status Comment   MRSA by PCR NEGATIVE  NEGATIVE  Final    Studies/Results: X-ray Chest Pa And Lateral   08/08/2011  *RADIOLOGY REPORT*  Clinical Data: Fever of unknown origin.  Shortness of breath.  CHEST - 2 VIEW 08/08/2011:  Comparison: CT chest 07/18/2011 [facility] and two-view chest x-ray 07/11/2011 [facility], 10/20/2008 [facility], 05/16/2007 [facility].  Findings: Prior sternotomy for CABG.  Biventricular pacing defibrillator unchanged and appears intact.  Cardiac silhouette mildly enlarged but stable.  Thoracic aorta mildly tortuous and atherosclerotic, unchanged.  Hilar and mediastinal contours otherwise unremarkable.  Lungs clear apart from minimal scarring in  the left lower lobe.  Pulmonary vascularity normal.  No pleural effusions.  Degenerative changes involving the thoracic spine.  IMPRESSION: Stable mild cardiomegaly.  Minimal scar in the left lower lobe.  No acute cardiopulmonary disease.  Original Report Authenticated By: Arnell Sieving, M.D.   Medications: I have reviewed the patient's current medications. Scheduled Meds:    . aspirin EC  81 mg Oral Daily  . calcium carbonate  1 tablet Oral TID  . feeding supplement  237 mL Oral BID BM  . feeding supplement  1 Container Oral TID BM  . metoprolol  25 mg Oral BID  . mulitivitamin with minerals  1 tablet Oral Daily  . pantoprazole  40 mg Oral Q1200  . DISCONTD: heparin  5,000 Units Subcutaneous Q8H  . DISCONTD: metoprolol  25 mg Oral Daily   Continuous Infusions:   . sodium chloride     PRN Meds:.diphenhydrAMINE, ondansetron (ZOFRAN) IV, ondansetron, traMADol, DISCONTD: acetaminophen, DISCONTD: acetaminophen Assessment/Plan:  FUO - Diagnosis broad. Spoke with Dr. Orvan Falconer and FUO workup is largely as available in EPIC.  Dr Ninetta Lights made good poitnt that GERD can cause fevers too.  Will proceed with initial testing for  HIV ELISA, RPR, quantiferon gold, SPEP/UPEP, peripheral smear, testing for brucellosis, q fever, HLA B27. Consider CT of right hip and BM bx to look for occult malignancy/infection.   GERD/Esophageal stricture - aggressive and leading to decreased by mouth intake. Gastroenterology consulted. On-call gastroenterologist deferred seeing the patient until this weekend when primary gastroenterologist (Dr. Madilyn Fireman) is on call unless emergent need for gastroenterologist as required. In the meantime we will continue PPI and mechanical soft diet.  -- Will support nutrition for now with full liquid diet or mechanical soft as the patient tolerates   CARDIOMYOPATHY, ISCHEMIC - Stable.  Appreciate cards input re: myocarditis. Cardiomyopathy and systolic heart failure could be  aggravating progressive functional decline. Appreciate cardiology recommendations. Will advance beta blocker and begin ASA.  - Advance metoprolol to BID  - Will start ASA  - Does cardiology team have objection to starting low dose ARB?  ICD - IN SITU - . Will defer to cardiology for further management of ICD. Appreciate their assistance.  CAD (coronary artery disease) - stable, will advance metoprolol adn start ASA. The patient appears to have statin allergy, so we will not initiate this at this time.    Weight loss, unintentional - this could be secondary to fever of unknown origin versus inability to maintain by mouth intake given esophageal stricture. Gastroenterology consulted. EGD  On-call gastroenterologist deferred seeing the patient until this weekend when Dr. Madilyn Fireman is on call unless emergent need for gastroenterologist  as required. Nutrition following.  Normocytic anemia - anemia is acute and has developed since March when hemoglobin was 14. Will consider anemia panel, though this will likely show anemia of chronic disease given his fever of unknown origin.  -- Anemia panel  -- Obtain peripheral smear   Thrombocytopenia - Most likely related to principal problem.  -- Obtain peripheral smear  -- Will hold heparin products given thrombocytopenia    DVT proph: SCDs   LOS: 1 day   Whitney Hillegass 08/09/2011, 4:35 PM

## 2011-08-09 NOTE — H&P (Signed)
44 man admitted for FUO.  Has esophagitis and long hx of dilations, often triggering brief fevers over 10 years.  Since Fall of 2012 has had increasing episodes of fever and diffusr pain and extreme fatigue.  No clear, consistent localizing sx.  Out-pt w/u by Dr. Orvan Falconer has been unrevealing, incl blood cults, pan-imaging and temporal artery bx.  Does not actually have headaches or jaw claudication and ESR peaked around 45 (CRP was clearly elevated).  TA bx negative.  LFTs nl, CBC with stable mild T-penia and recent hgb drop from 14 to 12.  Out-patient plan was for esophageal dilation coupled with TEE next week -- admitted for recent wgt loss and increasing malaise. No abx or steroids planned until we have some diagnostic guidance. Appreciate advice from ID.  Making arrangements with Drs Madilyn Fireman and Nahser for esophageal procedures. afebrile last night -- will avoid anti-pyretics while observing for fever.

## 2011-08-09 NOTE — Progress Notes (Addendum)
Attending note:  Pt has had intermittant  fevers up to 103 for the past several months.  He has a BI-V AICD in place.  He also raises chickens ( which brings in the possibility of avian sources of the fever).  I was not able to pass the TEE scope - it met an obstruction about 1/4 - 1/3 the way down the esophagus. A barium swallow revealed that the 13 mm barium pill would not  Pass.   I agree with endoscopy to be done first to further evaluate the esophageal stenosis.  Pt has known chronic systolic CHF.  EF 25-30%.   He has an AICD.  Vesta Mixer, Montez Hageman., MD, [facility] 08/09/2011, 5:30 AM
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
## 2011-08-09 NOTE — Clinical Documentation Improvement (Signed)
CHF DOCUMENTATION CLARIFICATION QUERY  THIS DOCUMENT IS NOT A PERMANENT PART OF THE MEDICAL RECORD  TO RESPOND TO THE THIS QUERY, FOLLOW THE INSTRUCTIONS BELOW:  1. If needed, update documentation for the patient's encounter via the notes activity.  2. Access this query again and click edit on the In [employer].  3. After updating, or not, click F2 to complete all highlighted (required) fields concerning your review. Select "additional documentation in the medical record" OR "no additional documentation provided".  4. Click Sign note button.  5. The deficiency will fall out of your In Basket *Please let us know if you are not able to complete this workflow by phone or e-mail (listed below).  Please update your documentation within the medical record to reflect your response to this query.                                                                                    08/09/11  Dear Dr. [facility],  In a better effort to capture your patient's severity of illness, reflect appropriate length of stay and utilization of resources, a review of the patient medical record has revealed the following indicators the diagnosis of Heart Failure.    Based on your clinical judgment, please clarify and document in a progress note and/or discharge summary the clinical condition associated with the following supporting information:  In responding to this query please exercise your independent judgment.  The fact that a query is asked, does not imply that any particular answer is desired or expected.  Possible Clinical Conditions?  Chronic Systolic Congestive Heart Failure Chronic Diastolic Congestive Heart Failure Chronic Systolic & Diastolic Congestive Heart Failure Acute Systolic Congestive Heart Failure Acute Diastolic Congestive Heart Failure Acute Systolic & Diastolic Congestive Heart Failure Acute on Chronic Systolic Congestive Heart Failure Acute on Chronic Diastolic Congestive  Heart Failure Acute on Chronic Systolic & Diastolic  Congestive Heart Failure Other Condition________________________________________ Cannot Clinically Determine  Supporting Information:  Risk Factors:(As per H&P) Pt has hx of CHF  Signs & Symptoms: (As per H&P) " SOB, DOE, cough, chest tightness, and wheezing."  Diagnostics:  Lab: 08-08-11 Pro B Natriuretic peptide (BNP) 522.1 (H)   2-D transthoracic echocardiogram: Study Date: 06/27/2011  - Left ventricle: The cavity size was severely dilated. Wall thickness was increased in a pattern of mild LVH. Systolic function was severely reduced. The estimated ejection fraction was in the range of 25% to 30%. Diffuse hypokinesis. - Mitral valve: Calcified annulus. Mild regurgitation. - Left atrium: The atrium was moderately dilated. - Atrial septum: No defect or patent foramen ovale was identified.   Treatment: ( As per H&P) Cardiology consulted   Reviewed: additional documentation in the medical record  Thank Bonita Quin  Joanette Gula [facility] RN,BSN Clinical Documentation Specialist: [phone] Pager Health Information Management [place]

## 2011-08-10 ENCOUNTER — Inpatient Hospital Stay (HOSPITAL_COMMUNITY): Payer: Medicare Other

## 2011-08-10 DIAGNOSIS — R509 Fever, unspecified: Secondary | ICD-10-CM

## 2011-08-10 LAB — FOLATE: Folate: >20 ng/mL

## 2011-08-10 LAB — IRON AND TIBC: TIBC: 243 ug/dL (ref 215–435)

## 2011-08-10 LAB — VITAMIN B12: Vitamin B-12: 949 pg/mL — ABNORMAL HIGH (ref 211–911)

## 2011-08-10 LAB — HIV ANTIBODY (ROUTINE TESTING W REFLEX): HIV: NONREACTIVE

## 2011-08-10 MED ORDER — IOHEXOL 300 MG/ML  SOLN
100.0000 mL | Freq: Once | INTRAMUSCULAR | Status: AC | PRN
  Administered 2011-08-10: 100 mL via INTRAVENOUS

## 2011-08-10 NOTE — Progress Notes (Signed)
Subjective:  Still having high fevers.  No chest pain.  Aches all over when fever goes up particularly in right hip.  Objective:  Vital Signs in the last 24 hours: Temp:  [98 F (36.7 C)-101.3 F (38.5 C)] 100.6 F (38.1 C) (05/11 0448) Pulse Rate:  [75-105] 105  (05/11 0448) Resp:  [18-20] 20  (05/11 0448) BP: (112-121)/(61-75) 112/71 mmHg (05/11 0448) SpO2:  [96 %-100 %] 96 % (05/11 0448) Weight:  [88.4 kg (194 lb 14.2 oz)] 88.4 kg (194 lb 14.2 oz) (05/10 2012)  Intake/Output from previous day: 05/10 0701 - 05/11 0700 In: 1080 [P.O.:820; I.V.:60] Out: -  Intake/Output from this shift:       . aspirin EC  81 mg Oral Daily  . calcium carbonate  1 tablet Oral TID  . feeding supplement  237 mL Oral BID BM  . feeding supplement  1 Container Oral TID BM  . metoprolol  25 mg Oral BID  . mulitivitamin with minerals  1 tablet Oral Daily  . pantoprazole  40 mg Oral Q1200  . DISCONTD: metoprolol  25 mg Oral Daily      . DISCONTD: sodium chloride Stopped (08/09/11 1750)    Physical Exam: The patient appears to be in no distress.  Head and neck exam reveals that the pupils are equal and reactive.  The extraocular movements are full.  There is no scleral icterus.  Mouth and pharynx are benign.  No lymphadenopathy.  No carotid bruits.  The jugular venous pressure is normal.  Thyroid is not enlarged or tender.  Chest is clear to percussion and auscultation.  No rales or rhonchi.  Expansion of the chest is symmetrical.  Heart reveals no abnormal lift or heave.  First and second heart sounds are normal. No significant murmur heard.  The abdomen is soft and nontender.  Bowel sounds are normoactive.  There is no hepatosplenomegaly or mass.  There are no abdominal bruits.  Extremities reveal no phlebitis or edema.  Pedal pulses are good.  There is no cyanosis or clubbing.  Neurologic exam is normal strength and no lateralizing weakness.  No sensory deficits.  Integument reveals  no rash.  No stigmata of SBE.  Lab Results:  Basename 08/09/11 0735 08/08/11 1645  WBC 5.8 5.7  HGB 12.0* 12.1*  PLT 87* 89*    Basename 08/09/11 0735 08/08/11 1645  NA 134* 135  K 5.0 3.9  CL 99 99  CO2 26 23  GLUCOSE 100* 110*  BUN 14 15  CREATININE 1.05 0.92    Basename 08/09/11 0735 08/08/11 2126  TROPONINI <0.30 <0.30   Hepatic Function Panel  Basename 08/08/11 1645  PROT 7.9  ALBUMIN 2.9*  AST 32  ALT 23  ALKPHOS 72  BILITOT 0.3  BILIDIR --  IBILI --    Basename 08/09/11 0735  CHOL 138   No results found for this basename: PROTIME in the last 72 hours  Imaging: X-ray Chest Pa And Lateral   08/08/2011  *RADIOLOGY REPORT*  Clinical Data: Fever of unknown origin.  Shortness of breath.  CHEST - 2 VIEW 08/08/2011:  Comparison: CT chest 07/18/2011 [facility] and two-view chest x-ray 07/11/2011 [facility], 10/20/2008 [facility], 05/16/2007 [facility].  Findings: Prior sternotomy for CABG.  Biventricular pacing defibrillator unchanged and appears intact.  Cardiac silhouette mildly enlarged but stable.  Thoracic aorta mildly tortuous and atherosclerotic, unchanged.  Hilar and mediastinal contours otherwise unremarkable.  Lungs clear apart from minimal scarring  in the left lower lobe.  Pulmonary vascularity normal.  No pleural effusions.  Degenerative changes involving the thoracic spine.  IMPRESSION: Stable mild cardiomegaly.  Minimal scar in the left lower lobe.  No acute cardiopulmonary disease.  Original Report Authenticated By: Arnell Sieving, M.D.    Cardiac Studies: Telemetry show NSR with ventricular pacing. Assessment/Plan:  Patient Active Hospital Problem List: FUO (fever of unknown origin) (06/26/2011)   Assessment: Awaiting TEE   Plan:Discussed with Dr. Dorena Cookey. From cardiac standpoint we can move his endoscopy followed by TEE up from Wednesday to Monday.  Dr. Madilyn Fireman will be calling Dr. Elease Hashimoto to discuss.       LOS: 2 days    Cassell Clement 08/10/2011, 8:08 AM

## 2011-08-10 NOTE — Progress Notes (Signed)
Patient ID: [name], male   DOB: 04-10-1945, 66 y.o.   MRN: [id number]  INFECTIOUS DISEASE PROGRESS NOTE    Date of Admission:  08/08/2011     Principal Problem:  *FUO (fever of unknown origin) Active Problems:  Weight loss, unintentional  Normocytic anemia  Thrombocytopenia  CARDIOMYOPATHY, ISCHEMIC  CONGESTIVE HEART FAILURE, LEFT  ICD - IN SITU  CAD (coronary artery disease)  Esophageal stricture  Multiple pulmonary nodules   Subjective: He feels a little bit better today. He recalls having aching all over whenever he has had fever in the last few months. Over the last few weeks the pain has, a little more severe and persistent and he notices some joint pain even when he is not having fever. Recently he has had more pain in his right hip. When he is having fever sometimes the pain in his right hip can be as bad as 10 out of 10.  Objective: Temp:  [98.4 F (36.9 C)-101.3 F (38.5 C)] 100.3 F (37.9 C) (05/11 1427) Pulse Rate:  [82-107] 87  (05/11 1427) Resp:  [19-20] 20  (05/11 1427) BP: (103-120)/(63-75) 103/63 mmHg (05/11 1427) SpO2:  [95 %-100 %] 96 % (05/11 1427) Weight:  [88.4 kg (194 lb 14.2 oz)] 88.4 kg (194 lb 14.2 oz) (05/10 2012)  General:  He looks better today than he did when admitted 2 days ago. He is alert and in good spirits. Skin: No rash. His right temporal artery biopsy site is healing nicely. He has no left temporal artery or facial artery tenderness. Oral: His chronic chelosis looks improved. He has a little bit of tenderness over the right temporomandibular joint when opening his mouth widely Lungs: Clear Cor: Regular S1 and S2 without murmurs Abdomen: Soft and nontender Joints: Normal  Lab Results Lab Results  Component Value Date   WBC 5.8 08/09/2011   HGB 12.0* 08/09/2011   HCT 36.1* 08/09/2011   MCV 82.4 08/09/2011   PLT 87* 08/09/2011    Lab Results  Component Value Date   CREATININE 1.05 08/09/2011   BUN 14 08/09/2011   NA 134*  08/09/2011   K 5.0 08/09/2011   CL 99 08/09/2011   CO2 26 08/09/2011    Lab Results  Component Value Date   ALT 23 08/08/2011   AST 32 08/08/2011   ALKPHOS 72 08/08/2011   BILITOT 0.3 08/08/2011       Microbiology: Recent Results (from the past 240 hour(s))  SURGICAL PCR SCREEN     Status: Abnormal   Collection Time   08/02/11 11:56 AM      Component Value Range Status Comment   MRSA, PCR POSITIVE (*) NEGATIVE  Final    Staphylococcus aureus POSITIVE (*) NEGATIVE  Final   CULTURE, BLOOD (ROUTINE X 2)     Status: Normal (Preliminary result)   Collection Time   08/08/11  4:45 PM      Component Value Range Status Comment   Specimen Description BLOOD LEFT ARM   Final    Special Requests BOTTLES DRAWN AEROBIC AND ANAEROBIC 10CC   Final    Culture  Setup Time [id number]   Final    Culture     Final    Value:        BLOOD CULTURE RECEIVED NO GROWTH TO DATE CULTURE WILL BE HELD FOR 5 DAYS BEFORE ISSUING A FINAL NEGATIVE REPORT   Report Status PENDING   Incomplete   CULTURE, BLOOD (ROUTINE X 2)  Status: Normal (Preliminary result)   Collection Time   08/08/11  4:45 PM      Component Value Range Status Comment   Specimen Description BLOOD LEFT ARM   Final    Special Requests BOTTLES DRAWN AEROBIC ONLY 3CC   Final    Culture  Setup Time [id number]   Final    Culture     Final    Value:        BLOOD CULTURE RECEIVED NO GROWTH TO DATE CULTURE WILL BE HELD FOR 5 DAYS BEFORE ISSUING A FINAL NEGATIVE REPORT   Report Status PENDING   Incomplete   MRSA PCR SCREENING     Status: Normal   Collection Time   08/08/11  9:28 PM      Component Value Range Status Comment   MRSA by PCR NEGATIVE  NEGATIVE  Final   AFB CULTURE, BLOOD     Status: Normal (Preliminary result)   Collection Time   08/09/11  9:00 PM      Component Value Range Status Comment   Specimen Description BLOOD ARM RIGHT   Final    Special Requests 5CC AFB   Final    Culture     Final    Value: CULTURE WILL BE EXAMINED FOR 6 WEEKS  BEFORE ISSUING A FINAL REPORT   Report Status PENDING   Incomplete     Studies/Results: No results found.   Assessment: There is still no explanation for his fever of unknown origin. I agree with CAT scan of his right hip. I would continue observation off of antibiotics pending results of outstanding blood test and CAT scan.  Plan: 1. Observe off of antibiotics 2. CAT scan of right hip 3. Await results of pending blood test 4. Upper endoscopy on May 13   [name], MD [facility] [phone] pager   [phone] cell 08/10/2011, 4:52 PM
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
## 2011-08-10 NOTE — Progress Notes (Addendum)
Subjective:    Currently, the patient confirms symptoms of weakness, sweating, feeling hot.   Interval Events: - Dr. Madilyn Fireman has kindly evaluated the patient and in conjunction with the cardiology service, plan to do the dilatation/ TEE on Monday. - Continues to have fevers overnight, low grade.   Objective:    Vital Signs:   Temp:  [98.4 F (36.9 C)-101.3 F (38.5 C)] 100.6 F (38.1 C) (05/11 1045) Pulse Rate:  [80-107] 107  (05/11 1045) Resp:  [19-20] 19  (05/11 1045) BP: (112-121)/(61-75) 114/65 mmHg (05/11 1045) SpO2:  [95 %-100 %] 95 % (05/11 1045) Weight:  [194 lb 14.2 oz (88.4 kg)] 194 lb 14.2 oz (88.4 kg) (05/10 2012) Last BM Date: 08/07/11  24-hour weight change: Weight change: 2 lb 6.2 oz (1.083 kg)  Intake/Output:   Intake/Output Summary (Last 24 hours) at 08/10/11 1122 Last data filed at 08/10/11 0900  Gross per 24 hour  Intake    920 ml  Output      0 ml  Net    920 ml      Physical Exam: General: Vital signs reviewed and noted. Well-developed, well-nourished, in no acute distress; alert, appropriate and cooperative throughout examination.  Lungs:  Normal respiratory effort. Clear to auscultation BL without crackles or wheezes.  Heart: RRR. S1 and S2 normal without gallop, murmur or rubs.  Abdomen:  BS normoactive. Soft, Nondistended, non-tender.  No masses or organomegaly.  Extremities: No pretibial edema. Right hip tenderness to palpation without overt swelling, warmth, or redness.     Labs:  Basic Metabolic Panel:  Lab 08/09/11 1191 08/08/11 1645  NA 134* 135  K 5.0 3.9  CL 99 99  CO2 26 23  GLUCOSE 100* 110*  BUN 14 15  CREATININE 1.05 0.92  CALCIUM 9.4 9.4  MG -- --  PHOS -- --    Liver Function Tests:  Lab 08/08/11 1645  AST 32  ALT 23  ALKPHOS 72  BILITOT 0.3  PROT 7.9  ALBUMIN 2.9*    CBC:  Lab 08/09/11 0735 08/08/11 1645  WBC 5.8 5.7  NEUTROABS -- 3.6  HGB 12.0* 12.1*  HCT 36.1* 35.9*  MCV 82.4 82.3  PLT 87* 89*     Cardiac Enzymes:  Lab 08/09/11 0735 08/08/11 2126 08/08/11 1645  CKTOTAL 14 20 18   CKMB 1.2 1.1 1.2  CKMBINDEX -- -- --  TROPONINI <0.30 <0.30 <0.30    Microbiology: Results for orders placed during the hospital encounter of 08/08/11  CULTURE, BLOOD (ROUTINE X 2)     Status: Normal (Preliminary result)   Collection Time   08/08/11  4:45 PM      Component Value Range Status Comment   Specimen Description BLOOD LEFT ARM   Final    Special Requests BOTTLES DRAWN AEROBIC AND ANAEROBIC 10CC   Final    Culture  Setup Time [id number]   Final    Culture     Final    Value:        BLOOD CULTURE RECEIVED NO GROWTH TO DATE CULTURE WILL BE HELD FOR 5 DAYS BEFORE ISSUING A FINAL NEGATIVE REPORT   Report Status PENDING   Incomplete   CULTURE, BLOOD (ROUTINE X 2)     Status: Normal (Preliminary result)   Collection Time   08/08/11  4:45 PM      Component Value Range Status Comment   Specimen Description BLOOD LEFT ARM   Final    Special Requests BOTTLES DRAWN AEROBIC ONLY 3CC  Final    Culture  Setup Time [id number]   Final    Culture     Final    Value:        BLOOD CULTURE RECEIVED NO GROWTH TO DATE CULTURE WILL BE HELD FOR 5 DAYS BEFORE ISSUING A FINAL NEGATIVE REPORT   Report Status PENDING   Incomplete   MRSA PCR SCREENING     Status: Normal   Collection Time   08/08/11  9:28 PM      Component Value Range Status Comment   MRSA by PCR NEGATIVE  NEGATIVE  Final    Anemia Panel:  Basename 08/09/11 1830  VITAMINB12 949*  FOLATE >20.0  FERRITIN 597*  TIBC 243  IRON 21*  RETICCTPCT 1.9    Pertinent ID Labs: HIV Ab Nonreactive 08/09/2011  RPR Negative 08/09/2011  Cryptococcal Ag Negative 08/09/2011  Brucella species Ab Pending   Quantiferon gold Pending   HLA-B27 Ag Pending   Chlamydia Ab Pending   Q fever Pending   Fungal Antibodies Pending    Other Pertinent Labs: ANA Negative 08/09/2011  RF 35 (high) 08/08/2011  TSH 1.5 (wnl) 08/08/2011  Uric acid 5.5 (wnl)  08/08/2011    Imaging:  X-ray Chest Pa And Lateral  (08/08/2011) - Stable mild cardiomegaly.  Minimal scar in the left lower lobe.  No acute cardiopulmonary disease.  Original Report Authenticated By: Arnell Sieving, M.D.     Medications:    Infusions:    . DISCONTD: sodium chloride Stopped (08/09/11 1750)    Scheduled Medications:    . aspirin EC  81 mg Oral Daily  . calcium carbonate  1 tablet Oral TID  . feeding supplement  237 mL Oral BID BM  . feeding supplement  1 Container Oral TID BM  . metoprolol  25 mg Oral BID  . mulitivitamin with minerals  1 tablet Oral Daily  . pantoprazole  40 mg Oral Q1200  . DISCONTD: metoprolol  25 mg Oral Daily    PRN Medications: diphenhydrAMINE, docusate sodium, ondansetron (ZOFRAN) IV, ondansetron, traMADol   Assessment/ Plan:    Pt is a 67 y.o. 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 At this point, clear etiology has not yet been derived, and workup continues.   1) FUO - Tmax 100.12F. Etiology still unclear. Differential Diagnosis broad - thus far, HIV, RPR, Cryptococcal Ag negative. Still awaiting multiple tests including: quantiferon gold, SPEP/UPEP, peripheral smear, testing for brucellosis, q fever, HLA B27, BCx for leptospirosis. Malignancy is also considered, although pt has had routine health maintenance exams without known abnormality. Current pain seems to be localized to right hip primarily. I also wonder about a possible rheumatologic contribution - patient's mother has RA, as well, he has ill defined pulmonary nodules (which can be seen with RA), RF is positive (although not specific for RA), and could also explain the fevers. Sjogrens is also considered in setting of described dry eye, angular chelitis, dry mouth. - Will order CT of right hip and consider BM bx to look for  occult malignancy/infection.  - Appreciate ID, GI, cardiology assistance and input in the management of our patient.  - Pending EGD with dilatation/ TEE on Monday - to evaluate for possible culture negative SBE. - Will add anti-CCP antibodies to evaluate for possible RA.  2) GERD/Esophageal stricture - aggressive and leading to decreased by mouth intake.  -  Appreciate GI assistance and input in the management of our patient - Dr. Madilyn Fireman and cardiology team plan dual TEE/ EGD on Monday. - Continue nutrition for now with full liquid diet or mechanical soft as the patient tolerates   3) CARDIOMYOPATHY, ISCHEMIC - Stable.  Appreciate cards input re: myocarditis. Cardiomyopathy and systolic heart failure could be aggravating progressive functional decline. Appreciate cardiology recommendations. Will advance beta blocker and begin ASA. - Advance metoprolol to BID  - Will start ASA  - Will consider low dose ARB as blood pressures stabilize.  4) ICD - IN SITU - . Will defer to cardiology for further management of ICD. Appreciate their assistance.  5) CAD (coronary artery disease) with hx of CABG x 4 vessels (2003) - stable.  - continue Aspirin - continue BID metoprolol - The patient appears to have statin allergy, so we will not initiate this at this time.    6) Weight loss, unintentional - this could be secondary to fever of unknown origin (and it's underlying cause) versus inability to maintain by mouth intake given esophageal stricture.  - Appreciate Nutrition assistance and input in the management of our patient.  - Appreciate GI assistance and input in the management of our patient.   7) Normocytic anemia - anemia is acute and has developed since March when hemoglobin was 14. Anemia panel showing Fe 21, TIBC 241, ferritin 597. Although most consistent with an anemia of chronic disease, transferrin saturation is low, as well, ferritin may be acutely elevated 2/2 to the fevers. Therefore,  difficulty to ascertain for now. Pt has last colonoscopy in 2012 showing sigmoid and descending diverticula (but no recurrent polyps - last noted to have hyperplastic polyps in 2001) - Pending peripheral smear  - Check FOBT  8) Thrombocytopenia - Most likely related to principal problem.  - Obtain peripheral smear  - Will hold heparin products given thrombocytopenia   DVT proph: SCDs   LOS: 2 days   Johnette Abraham, D.O. 08/10/2011, 11:32 AM

## 2011-08-11 DIAGNOSIS — I501 Left ventricular failure: Secondary | ICD-10-CM

## 2011-08-11 LAB — CBC
HCT: 36.7 % — ABNORMAL LOW (ref 39.0–52.0)
Hemoglobin: 12.2 g/dL — ABNORMAL LOW (ref 13.0–17.0)
MCH: 27.3 pg (ref 26.0–34.0)
MCHC: 33.2 g/dL (ref 30.0–36.0)
RDW: 15.1 % (ref 11.5–15.5)

## 2011-08-11 LAB — BASIC METABOLIC PANEL
BUN: 13 mg/dL (ref 6–23)
Creatinine, Ser: 1.02 mg/dL (ref 0.50–1.35)
GFR calc Af Amer: 87 mL/min — ABNORMAL LOW (ref >90)
GFR calc non Af Amer: 75 mL/min — ABNORMAL LOW (ref >90)
Glucose, Bld: 135 mg/dL — ABNORMAL HIGH (ref 70–99)
Potassium: 4.5 mEq/L (ref 3.5–5.1)

## 2011-08-11 LAB — OCCULT BLOOD X 1 CARD TO LAB, STOOL: Fecal Occult Bld: NEGATIVE

## 2011-08-11 MED ORDER — POLYETHYLENE GLYCOL 3350 17 G PO PACK
17.0000 g | PACK | Freq: Every day | ORAL | Status: DC
  Administered 2011-08-11 – 2011-08-17 (×3): 17 g via ORAL
  Filled 2011-08-11 (×13): qty 1

## 2011-08-11 MED ORDER — SENNOSIDES-DOCUSATE SODIUM 8.6-50 MG PO TABS
1.0000 | ORAL_TABLET | Freq: Two times a day (BID) | ORAL | Status: DC
  Administered 2011-08-11 – 2011-08-15 (×8): 1 via ORAL
  Filled 2011-08-11 (×11): qty 1

## 2011-08-11 NOTE — Progress Notes (Signed)
Patient ID: [name], male   DOB: [birth year], 66 y.o.   MRN: [id number]  INFECTIOUS DISEASE PROGRESS NOTE    Date of Admission:  08/08/2011     Principal Problem:  *FUO (fever of unknown origin) Active Problems:  Weight loss, unintentional  Normocytic anemia  Thrombocytopenia  CARDIOMYOPATHY, ISCHEMIC  CONGESTIVE HEART FAILURE, LEFT  ICD - IN SITU  CAD (coronary artery disease)  Esophageal stricture  Multiple pulmonary nodules  Subjective: His right hip pain has improved but he is now aching all over again.  Objective: Temp:  [100.3 F (37.9 C)-100.9 F (38.3 C)] 100.3 F (37.9 C) (05/12 1116) Pulse Rate:  [10-101] 10  (05/12 1116) Resp:  [18-19] 18  (05/12 1116) BP: (106-125)/(57-76) 125/76 mmHg (05/12 1116) SpO2:  [90 %-96 %] 93 % (05/12 1116) Weight:  [85.095 kg (187 lb 9.6 oz)] 85.095 kg (187 lb 9.6 oz) (05/11 2100)  General: He is alert and conversant and in no distress Skin: No rash or palpable adenopathy Lungs: Clear Cor: Regular S1-S2 with no murmurs Abdomen: Soft and nontender Joints: Normal  Lab Results Lab Results  Component Value Date   WBC 6.5 08/11/2011   HGB 12.2* 08/11/2011   HCT 36.7* 08/11/2011   MCV 82.1 08/11/2011   PLT 125* 08/11/2011    Lab Results  Component Value Date   CREATININE 1.02 08/11/2011   BUN 13 08/11/2011   NA 133* 08/11/2011   K 4.5 08/11/2011   CL 96 08/11/2011   CO2 23 08/11/2011    Lab Results  Component Value Date   ALT 23 08/08/2011   AST 32 08/08/2011   ALKPHOS 72 08/08/2011   BILITOT 0.3 08/08/2011       Microbiology: Recent Results (from the past 240 hour(s))  SURGICAL PCR SCREEN     Status: Abnormal   Collection Time   08/02/11 11:56 AM      Component Value Range Status Comment   MRSA, PCR POSITIVE (*) NEGATIVE  Final    Staphylococcus aureus POSITIVE (*) NEGATIVE  Final   CULTURE, BLOOD (ROUTINE X 2)     Status: Normal (Preliminary result)   Collection Time   08/08/11  4:45 PM      Component Value Range Status  Comment   Specimen Description BLOOD LEFT ARM   Final    Special Requests BOTTLES DRAWN AEROBIC AND ANAEROBIC 10CC   Final    Culture  Setup Time [id number]   Final    Culture     Final    Value:        BLOOD CULTURE RECEIVED NO GROWTH TO DATE CULTURE WILL BE HELD FOR 5 DAYS BEFORE ISSUING A FINAL NEGATIVE REPORT   Report Status PENDING   Incomplete   CULTURE, BLOOD (ROUTINE X 2)     Status: Normal (Preliminary result)   Collection Time   08/08/11  4:45 PM      Component Value Range Status Comment   Specimen Description BLOOD LEFT ARM   Final    Special Requests BOTTLES DRAWN AEROBIC ONLY 3CC   Final    Culture  Setup Time [id number]   Final    Culture     Final    Value:        BLOOD CULTURE RECEIVED NO GROWTH TO DATE CULTURE WILL BE HELD FOR 5 DAYS BEFORE ISSUING A FINAL NEGATIVE REPORT   Report Status PENDING   Incomplete   MRSA PCR SCREENING     Status: Normal  Collection Time   08/08/11  9:28 PM      Component Value Range Status Comment   MRSA by PCR NEGATIVE  NEGATIVE  Final   AFB CULTURE, BLOOD     Status: Normal (Preliminary result)   Collection Time   08/09/11  9:00 PM      Component Value Range Status Comment   Specimen Description BLOOD ARM RIGHT   Final    Special Requests 5CC AFB   Final    Culture     Final    Value: CULTURE WILL BE EXAMINED FOR 6 WEEKS BEFORE ISSUING A FINAL REPORT   Report Status PENDING   Incomplete     Studies/Results: Ct Hip Right W Contrast  08/10/2011  *RADIOLOGY REPORT*  Clinical Data: Right hip pain.  Fever of unknown origin.  CT OF THE RIGHT HIP WITH CONTRAST  Technique:  Multidetector CT imaging was performed following the standard protocol during bolus administration of intravenous contrast.  Contrast: OMNIPAQUE IOHEXOL 300 MG/ML  SOLN  Comparison: None.  Findings: No fluid collection is identified.  Subcutaneous and muscular tissues are unremarkable.  Fat containing right inguinal hernia is incidentally noted.  No hip joint effusion  is identified. No pathologic enhancement after contrast administration is seen. The hip is located.  There is no fracture.  No periosteal reaction or bony destructive change is seen.  IMPRESSION:  1.  No acute finding or finding to explain the patient's fever. 2.  Fat containing inguinal hernia is incidentally noted.  Original Report Authenticated By: Bernadene Bell. Maricela Curet, M.D.     Assessment: The cause of his FUO remains elusive. So far there is no clear evidence of any infection or malignancy. His elevated rheumatoid factor is nonspecific. I would recommend that we await the results of his pending serologies and tomorrow's upper endoscopy and TEE. If he continues to have fever and no diagnosis is forthcoming I would repeat his sed rate and C-reactive protein. His diffuse aching pain, prolonged fever and weight loss are compatible with polymyalgia rheumatica. Hematologic malignancy seems less likely but his platelets are still slightly low and we may need to consider bone marrow biopsy before empiric trial of steroids.  Plan: 1. Continue observation off of antibiotics 2. Await results of serologic tests 3. Await results of tomorrow's upper endoscopy and TEE 4. Consider bone marrow examination   [name], MD [facility] [phone] pager   [phone] cell 08/11/2011, 2:33 PM

## 2011-08-11 NOTE — Progress Notes (Signed)
PROGRESS NOTE  Subjective:   [name] is a 66 y.o. gentleman with hx of 3 V CAD - s/p CABG, CHF ( EF 25-30%), ICD admitted with recurrent fevers.  He has had these fevers for several months.  He has lost 30+ pounds.  He has been seen int he ID clinic and no definitive dx has been made yet.    We were unable to pass the TEE probe several weeks ago.  The plan is for him to have EGD tomorrow followed by TEE if his esophagus is OK.    Objective:    Vital Signs:   Temp:  [100.3 F (37.9 C)-100.9 F (38.3 C)] 100.3 F (37.9 C) (05/12 0448) Pulse Rate:  [87-107] 100  (05/12 0448) Resp:  [18-20] 18  (05/12 0448) BP: (103-125)/(57-71) 106/63 mmHg (05/12 0448) SpO2:  [90 %-96 %] 90 % (05/12 0448) Weight:  [187 lb 9.6 oz (85.095 kg)] 187 lb 9.6 oz (85.095 kg) (05/11 2100)  Last BM Date: 08/07/11   24-hour weight change: Weight change: -7 lb 4.6 oz (-3.305 kg)  Weight trends: Filed Weights   08/09/11 0300 08/09/11 2012 08/10/11 2100  Weight: 192 lb 8 oz (87.317 kg) 194 lb 14.2 oz (88.4 kg) 187 lb 9.6 oz (85.095 kg)    Intake/Output:  05/11 0701 - 05/12 0700 In: 980 [P.O.:980] Out: 300 [Urine:300]     Physical Exam: BP 106/63  Pulse 100  Temp(Src) 100.3 F (37.9 C) (Rectal)  Resp 18  Ht 5\' 10"  (1.778 m)  Wt 187 lb 9.6 oz (85.095 kg)  BMI 26.92 kg/m2  SpO2 90%  General: Vital signs reviewed and noted. Well-developed, well-nourished, in no acute distress; alert, appropriate and cooperative throughout examination.  Head: Normocephalic, atraumatic.  Eyes: conjunctivae/corneas clear. PERRL, EOM's intact. Fundi benign.  Throat: Oropharynx nonerythematous, no exudate appreciated.   Neck: Supple. Normal carotids. No JVD  Lungs:  Clear bilaterally to auscultation without wheezes, rales, or rhonchi. Breathing is unlabored.  Heart: Regular rate,  With normal  S1 S2. No murmurs, rubs, or gallops   Abdomen:  Soft, non-tender, non-distended with normoactive bowel sounds. No  hepatomegaly. No rebound/guarding. No abdominal masses.  Extremities: No edema.  Distal pedal pulses are 2+ and equal bilaterally.  Neurologic: A&O X3, CN II - XII are grossly intact. Motor strength is 5/5 in the all 4 extremities.  Psych: Responds to questions appropriately with normal affect.    Labs: BMET:  Basename 08/09/11 0735 08/08/11 1645  NA 134* 135  K 5.0 3.9  CL 99 99  CO2 26 23  GLUCOSE 100* 110*  BUN 14 15  CREATININE 1.05 0.92  CALCIUM 9.4 9.4  MG -- --  PHOS -- --    Liver function tests:  Basename 08/08/11 1645  AST 32  ALT 23  ALKPHOS 72  BILITOT 0.3  PROT 7.9  ALBUMIN 2.9*   No results found for this basename: LIPASE:2,AMYLASE:2 in the last 72 hours  CBC:  Basename 08/09/11 0735 08/08/11 1645  WBC 5.8 5.7  NEUTROABS -- 3.6  HGB 12.0* 12.1*  HCT 36.1* 35.9*  MCV 82.4 82.3  PLT 87* 89*    Cardiac Enzymes:  Basename 08/09/11 0735 08/08/11 2126 08/08/11 1645  CKTOTAL 14 20 18   CKMB 1.2 1.1 1.2  TROPONINI <0.30 <0.30 <0.30    Coagulation Studies: No results found for this basename: LABPROT:5,INR:5 in the last 72 hours  Other: No components found with this basename: POCBNP:3 No results found for this basename: DDIMER  in the last 72 hours No results found for this basename: HGBA1C in the last 72 hours  Basename 08/09/11 0735  CHOL 138  HDL 16*  LDLCALC 78  TRIG 332*  CHOLHDL 8.6    Basename 08/08/11 1645  TSH 1.523  T4TOTAL --  T3FREE --  THYROIDAB --    Basename 08/09/11 1830  VITAMINB12 949*  FOLATE >20.0  FERRITIN 597*  TIBC 243  IRON 21*  RETICCTPCT 1.9        Medications:    Infusions:    Scheduled Medications:    . aspirin EC  81 mg Oral Daily  . calcium carbonate  1 tablet Oral TID  . feeding supplement  237 mL Oral BID BM  . feeding supplement  1 Container Oral TID BM  . metoprolol  25 mg Oral BID  . mulitivitamin with minerals  1 tablet Oral Daily  . pantoprazole  40 mg Oral Q1200     Assessment/ Plan:     FUO (fever of unknown origin) (06/26/2011)  CONGESTIVE HEART FAILURE, LEFT (03/20/2010)   His heart failure is stable.   ICD - IN SITU (03/20/2010)  His generator battery is close to ERI.  We will ask EP to comment on just how long it will go .  We would prefer that he not have a new device placed while he still has this FUO.   CAD (coronary artery disease) (07/10/2010)   Stable - no angina  Esophageal stricture (07/18/2011)   For EGD tomorrow followed by TEE   Length of Stay: 3  Alvia Grove., MD, [facility] 08/11/2011, 8:29 AM

## 2011-08-11 NOTE — Progress Notes (Signed)
Subjective:    Currently, the patient continues to have generalized weakness, not really improved from prior. Still    Interval Events: - Plan for esophageal dilatation/ TEE on Monday. - Continues to have fevers throughout the night, remain low grade.   Objective:    Vital Signs:   Temp:  [100.3 F (37.9 C)-100.9 F (38.3 C)] 100.3 F (37.9 C) (05/12 0448) Pulse Rate:  [87-107] 100  (05/12 0448) Resp:  [18-20] 18  (05/12 0448) BP: (103-125)/(57-71) 106/63 mmHg (05/12 0448) SpO2:  [90 %-96 %] 90 % (05/12 0448) Weight:  [187 lb 9.6 oz (85.095 kg)] 187 lb 9.6 oz (85.095 kg) (05/11 2100) Last BM Date: 08/07/11  24-hour weight change: Weight change: -7 lb 4.6 oz (-3.305 kg)  Intake/Output:   Intake/Output Summary (Last 24 hours) at 08/11/11 0854 Last data filed at 08/11/11 0643  Gross per 24 hour  Intake    980 ml  Output    300 ml  Net    680 ml      Physical Exam: General: Vital signs reviewed and noted. Well-developed, well-nourished, in no acute distress; alert, appropriate and cooperative throughout examination.  Lungs:  Normal respiratory effort. Clear to auscultation BL without crackles or wheezes.  Heart: RRR. S1 and S2 normal without gallop, murmur or rubs.  Abdomen:  BS normoactive. Soft, Nondistended, non-tender.  No masses or organomegaly.  Extremities: No pretibial edema. Right hip tenderness to palpation without overt swelling, warmth, or redness.     Labs:  Basic Metabolic Panel:  Lab 08/09/11 1610 08/08/11 1645  NA 134* 135  K 5.0 3.9  CL 99 99  CO2 26 23  GLUCOSE 100* 110*  BUN 14 15  CREATININE 1.05 0.92  CALCIUM 9.4 9.4  MG -- --  PHOS -- --    Liver Function Tests:  Lab 08/08/11 1645  AST 32  ALT 23  ALKPHOS 72  BILITOT 0.3  PROT 7.9  ALBUMIN 2.9*    CBC:  Lab 08/09/11 0735 08/08/11 1645  WBC 5.8 5.7  NEUTROABS -- 3.6  HGB 12.0* 12.1*  HCT 36.1* 35.9*  MCV 82.4 82.3  PLT 87* 89*    Cardiac Enzymes:  Lab 08/09/11  0735 08/08/11 2126 08/08/11 1645  CKTOTAL 14 20 18   CKMB 1.2 1.1 1.2  CKMBINDEX -- -- --  TROPONINI <0.30 <0.30 <0.30    Microbiology: Results for orders placed during the hospital encounter of 08/08/11  CULTURE, BLOOD (ROUTINE X 2)     Status: Normal (Preliminary result)   Collection Time   08/08/11  4:45 PM      Component Value Range Status Comment   Specimen Description BLOOD LEFT ARM   Final    Special Requests BOTTLES DRAWN AEROBIC AND ANAEROBIC 10CC   Final    Culture  Setup Time [id number]   Final    Culture     Final    Value:        BLOOD CULTURE RECEIVED NO GROWTH TO DATE CULTURE WILL BE HELD FOR 5 DAYS BEFORE ISSUING A FINAL NEGATIVE REPORT   Report Status PENDING   Incomplete   CULTURE, BLOOD (ROUTINE X 2)     Status: Normal (Preliminary result)   Collection Time   08/08/11  4:45 PM      Component Value Range Status Comment   Specimen Description BLOOD LEFT ARM   Final    Special Requests BOTTLES DRAWN AEROBIC ONLY 3CC   Final    Culture  Setup  Time [id number]   Final    Culture     Final    Value:        BLOOD CULTURE RECEIVED NO GROWTH TO DATE CULTURE WILL BE HELD FOR 5 DAYS BEFORE ISSUING A FINAL NEGATIVE REPORT   Report Status PENDING   Incomplete   MRSA PCR SCREENING     Status: Normal   Collection Time   08/08/11  9:28 PM      Component Value Range Status Comment   MRSA by PCR NEGATIVE  NEGATIVE  Final   AFB CULTURE, BLOOD     Status: Normal (Preliminary result)   Collection Time   08/09/11  9:00 PM      Component Value Range Status Comment   Specimen Description BLOOD ARM RIGHT   Final    Special Requests 5CC AFB   Final    Culture     Final    Value: CULTURE WILL BE EXAMINED FOR 6 WEEKS BEFORE ISSUING A FINAL REPORT   Report Status PENDING   Incomplete    Anemia Panel:  Basename 08/09/11 1830  VITAMINB12 949*  FOLATE >20.0  FERRITIN 597*  TIBC 243  IRON 21*  RETICCTPCT 1.9    Pertinent ID Labs: HIV Ab Nonreactive 08/09/2011  RPR Negative  08/09/2011  Cryptococcal Ag Negative 08/09/2011  Brucella species Ab Pending   Quantiferon gold Pending   HLA-B27 Ag Pending   Chlamydia Ab Pending   Q fever Pending   Fungal Antibodies Pending    Other Pertinent Labs: ANA Negative 08/09/2011  RF 35 (high) 08/08/2011  TSH 1.5 (wnl) 08/08/2011  Uric acid 5.5 (wnl) 08/08/2011    Imaging:  X-ray Chest Pa And Lateral  (08/08/2011) - Stable mild cardiomegaly.  Minimal scar in the left lower lobe.  No acute cardiopulmonary disease.  Original Report Authenticated By: Arnell Sieving, M.D.  CT right hip with contrast (08/10/2011) - No acute finding or finding to explain the patient's fever. 2.  Fat containing inguinal hernia is incidentally noted.   Medications:    Infusions:    Scheduled Medications:    . aspirin EC  81 mg Oral Daily  . calcium carbonate  1 tablet Oral TID  . feeding supplement  237 mL Oral BID BM  . feeding supplement  1 Container Oral TID BM  . metoprolol  25 mg Oral BID  . mulitivitamin with minerals  1 tablet Oral Daily  . pantoprazole  40 mg Oral Q1200    PRN Medications: diphenhydrAMINE, docusate sodium, iohexol, ondansetron (ZOFRAN) IV, ondansetron, traMADol   Assessment/ Plan:    Pt is a 66 y.o. 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 At this point, clear etiology has not yet been derived, and workup continues.   1) FUO - Tmax 100.76F. Etiology still unclear. Differential Diagnosis broad - thus far, HIV, RPR, Cryptococcal Ag negative. Still awaiting multiple tests including: quantiferon gold, SPEP/UPEP, peripheral smear, testing for brucellosis, q fever, HLA B27, BCx for leptospirosis. Malignancy is also considered, although pt has had routine health maintenance exams without known abnormality. Current pain seems to be localized to right hip primarily. I  also wonder about a possible rheumatologic contribution - patient's mother has RA, as well, he has ill defined pulmonary nodules (which can be seen with RA), RF is positive (although not specific for RA), and could also explain the  fevers. Sjogrens is also considered in setting of described dry eye, angular chelitis, dry mouth.  - Consider BM bx to look for occult malignancy/infection.  - Appreciate ID, GI, cardiology assistance and input in the management of our patient.  - Pending EGD with dilatation/ TEE on Monday - to evaluate for possible culture negative SBE. - Will follow-up pending ID anti-CCP antibodies to evaluate for possible RA.  2) GERD/Esophageal stricture - aggressive and leading to decreased by mouth intake.  - Appreciate GI assistance and input in the management of our patient - Dr. Madilyn Fireman and cardiology team plan dual TEE/ EGD on Monday. - Continue nutrition for now with full liquid diet or mechanical soft as the patient tolerates   3) Ischemic cardiomyopathy - Stable. Cardiomyopathy and systolic heart failure could be aggravating progressive functional decline. Appreciate cardiology recommendations. Will advance beta blocker and begin ASA. - Advance metoprolol to BID  - Will start ASA  - Will consider low dose ARB as blood pressures stabilize.  4) ICD - IN SITU - . Will defer to cardiology for further management of ICD. Appreciate their assistance.  5) CAD (coronary artery disease) with hx of CABG x 4 vessels (2003) - stable.  - continue Aspirin - continue BID metoprolol - The patient appears to have statin allergy, so we will not initiate this at this time.    6) Weight loss, unintentional - this could be secondary to fever of unknown origin (and it's underlying cause) versus inability to maintain by mouth intake given esophageal stricture.  - Appreciate Nutrition assistance and input in the management of our patient.  - Appreciate GI assistance and input in the management  of our patient.   7) Normocytic anemia - anemia is acute and has developed since March when hemoglobin was 14. Anemia panel showing Fe 21, TIBC 241, ferritin 597. Although most consistent with an anemia of chronic disease, transferrin saturation is low, as well, ferritin may be acutely elevated 2/2 to the fevers. Therefore, difficulty to ascertain definitively for now. Pt has last colonoscopy in 2012 showing sigmoid and descending diverticula (but no recurrent polyps - last noted to have hyperplastic polyps in 2001). Reticulocyte index based on reticulocyte % of 1.9 and HCT 36.1 is 1.01. This may indicate hypoproliferation, may be due to his iron deficiency or his anemia of chronic disease, but could also be malignancy related. - Pending peripheral smear  - Check FOBT. - Will discuss with Dr. Orvan Falconer, but we may need to consider bone marrow biopsy and hematology consult.   8) Thrombocytopenia - Most likely related to principal problem.  - Obtain peripheral smear  - Will hold heparin products given thrombocytopenia  9) Constipation - likely also related to limited oral intake. - Will add senna-s and miralax.  DVT proph: SCDs   LOS: 3 days    Johnette Abraham, Erline Levine, Internal Medicine Resident Pager (7AM-5PM): [phone] 08/11/2011, 12:06 PM

## 2011-08-12 ENCOUNTER — Encounter (HOSPITAL_COMMUNITY)
Admission: AD | Disposition: A | Discharge status: Home Health | Payer: Self-pay | Source: Ambulatory Visit | Attending: Internal Medicine

## 2011-08-12 ENCOUNTER — Encounter (HOSPITAL_COMMUNITY): Payer: Self-pay | Admitting: Gastroenterology

## 2011-08-12 ENCOUNTER — Inpatient Hospital Stay (HOSPITAL_COMMUNITY): Payer: Medicare Other

## 2011-08-12 ENCOUNTER — Ambulatory Visit (HOSPITAL_COMMUNITY): Admit: 2011-08-12 | Payer: Medicare Other | Admitting: Gastroenterology

## 2011-08-12 DIAGNOSIS — I059 Rheumatic mitral valve disease, unspecified: Secondary | ICD-10-CM

## 2011-08-12 DIAGNOSIS — K222 Esophageal obstruction: Secondary | ICD-10-CM

## 2011-08-12 HISTORY — PX: ESOPHAGOGASTRODUODENOSCOPY: SHX5428

## 2011-08-12 HISTORY — PX: TEE WITHOUT CARDIOVERSION: SHX5443

## 2011-08-12 HISTORY — PX: SAVORY DILATION: SHX5439

## 2011-08-12 LAB — PATHOLOGIST SMEAR REVIEW: Tech Review: NORMAL

## 2011-08-12 SURGERY — ECHOCARDIOGRAM, TRANSESOPHAGEAL
Anesthesia: Moderate Sedation

## 2011-08-12 SURGERY — EGD (ESOPHAGOGASTRODUODENOSCOPY)
Anesthesia: Moderate Sedation

## 2011-08-12 MED ORDER — SODIUM CHLORIDE 0.9 % IV SOLN: INTRAVENOUS | Status: DC

## 2011-08-12 MED ORDER — ACETAMINOPHEN 325 MG PO TABS: 650.0000 mg | ORAL_TABLET | Freq: Four times a day (QID) | ORAL | Status: DC | PRN

## 2011-08-12 MED ORDER — FLUCONAZOLE 100 MG PO TABS
100.0000 mg | ORAL_TABLET | Freq: Every day | ORAL | Status: DC
Start: 2011-08-13 — End: 2011-08-13
  Filled 2011-08-12: qty 1

## 2011-08-12 MED ORDER — MIDAZOLAM HCL 10 MG/2ML IJ SOLN
INTRAMUSCULAR | Status: AC
  Filled 2011-08-12: qty 4

## 2011-08-12 MED ORDER — FENTANYL CITRATE 0.05 MG/ML IJ SOLN
INTRAMUSCULAR | Status: AC
  Filled 2011-08-12: qty 4

## 2011-08-12 MED ORDER — BUTAMBEN-TETRACAINE-BENZOCAINE 2-2-14 % EX AERO
INHALATION_SPRAY | CUTANEOUS | Status: DC | PRN
  Administered 2011-08-12: 2 via TOPICAL

## 2011-08-12 MED ORDER — MIDAZOLAM HCL 10 MG/2ML IJ SOLN
INTRAMUSCULAR | Status: DC | PRN
  Administered 2011-08-12 (×3): 2 mg via INTRAVENOUS

## 2011-08-12 MED ORDER — FLUCONAZOLE 200 MG PO TABS
200.0000 mg | ORAL_TABLET | Freq: Every day | ORAL | Status: AC
  Administered 2011-08-12: 200 mg via ORAL
  Filled 2011-08-12: qty 1

## 2011-08-12 MED ORDER — FENTANYL NICU IV SYRINGE 50 MCG/ML
INJECTION | INTRAMUSCULAR | Status: DC | PRN
  Administered 2011-08-12 (×3): 25 ug via INTRAVENOUS

## 2011-08-12 MED ORDER — DIPHENHYDRAMINE HCL 50 MG/ML IJ SOLN
INTRAMUSCULAR | Status: AC
  Filled 2011-08-12: qty 1

## 2011-08-12 MED ORDER — ACETAMINOPHEN 325 MG PO TABS
650.0000 mg | ORAL_TABLET | Freq: Four times a day (QID) | ORAL | Status: AC | PRN
  Administered 2011-08-14 – 2011-08-15 (×2): 650 mg via ORAL
  Filled 2011-08-12 (×3): qty 2

## 2011-08-12 NOTE — Progress Notes (Signed)
IM Attending  Surprise finding of severe esophageal candidiasis.  Will see if ID believes this could be source of FUO.  Regardless, next question is why he has this.  Doubtless related to stricture disease but he has had strictures and repeat dilations for years and we do not know of prior candidiasis.

## 2011-08-12 NOTE — Interval H&P Note (Signed)
History and Physical Interval Note:  08/12/2011 10:05 AM  [name]  has presented today for surgery, with the diagnosis of fever  The various methods of treatment have been discussed with the patient and family. After consideration of risks, benefits and other options for treatment, the patient has consented to  Procedure(s) (LRB): TRANSESOPHAGEAL ECHOCARDIOGRAM (TEE) (N/A) as a surgical intervention .  The patients' history has been reviewed, patient examined, no change in status, stable for surgery.  I have reviewed the patients' chart and labs.  Questions were answered to the patient's satisfaction.     [name]

## 2011-08-12 NOTE — H&P (View-Only) (Signed)
PROGRESS NOTE  Subjective:   [name] is a 66 y.o. gentleman with hx of 3 V CAD - s/p CABG, CHF ( EF 25-30%), ICD admitted with recurrent fevers.  He has had these fevers for several months.  He has lost 30+ pounds.  He has been seen int he ID clinic and no definitive dx has been made yet.    We were unable to pass the TEE probe several weeks ago.  The plan is for him to have EGD today followed by TEE if his esophagus is OK.  The VS overnight suggest that he had a temp of 104.4.  He states this is an error and his temp was probably 100.4.     Objective:    Vital Signs:   Temp:  [99.2 F (37.3 C)-104.4 F (40.2 C)] 100 F (37.8 C) (05/13 0533) Pulse Rate:  [95-106] 106  (05/13 0533) Resp:  [18-20] 18  (05/13 0533) BP: (114-125)/(64-78) 123/70 mmHg (05/13 0533) SpO2:  [93 %-97 %] 95 % (05/13 0533) Weight:  [191 lb (86.637 kg)] 191 lb (86.637 kg) (05/12 2250)  Last BM Date: 08/11/11   24-hour weight change: Weight change: 3 lb 6.4 oz (1.542 kg)  Weight trends: Filed Weights   08/09/11 2012 08/10/11 2100 08/11/11 2250  Weight: 194 lb 14.2 oz (88.4 kg) 187 lb 9.6 oz (85.095 kg) 191 lb (86.637 kg)    Intake/Output:  05/12 0701 - 05/13 0700 In: 780 [P.O.:780] Out: 901 [Urine:900; Stool:1]     Physical Exam: BP 123/70  Pulse 106  Temp(Src) 100 F (37.8 C) (Rectal)  Resp 18  Ht 5\' 10"  (1.778 m)  Wt 191 lb (86.637 kg)  BMI 27.41 kg/m2  SpO2 95%  General: Vital signs reviewed and noted. Well-developed, well-nourished, in no acute distress; alert, appropriate and cooperative throughout examination.  Head: Normocephalic, atraumatic.  Eyes: conjunctivae/corneas clear. PERRL, EOM's intact. Fundi benign.  Throat: Oropharynx nonerythematous, no exudate appreciated.   Neck: Supple. Normal carotids. No JVD  Lungs:  Clear bilaterally to auscultation without wheezes, rales, or rhonchi. Breathing is unlabored.  Heart: Regular rate,  With normal  S1 S2. No murmurs, rubs, or  gallops   Abdomen:  Soft, non-tender, non-distended with normoactive bowel sounds. No hepatomegaly. No rebound/guarding. No abdominal masses.  Extremities: No edema.  Distal pedal pulses are 2+ and equal bilaterally.  Neurologic: A&O X3, CN II - XII are grossly intact. Motor strength is 5/5 in the all 4 extremities.  Psych: Responds to questions appropriately with normal affect.    Labs: BMET:  Basename 08/11/11 0839  NA 133*  K 4.5  CL 96  CO2 23  GLUCOSE 135*  BUN 13  CREATININE 1.02  CALCIUM 9.6  MG --  PHOS --    Liver function tests: No results found for this basename: AST:2,ALT:2,ALKPHOS:2,BILITOT:2,PROT:2,ALBUMIN:2 in the last 72 hours No results found for this basename: LIPASE:2,AMYLASE:2 in the last 72 hours  CBC:  Basename 08/11/11 0839  WBC 6.5  NEUTROABS --  HGB 12.2*  HCT 36.7*  MCV 82.1  PLT 125*    Cardiac Enzymes: No results found for this basename: CKTOTAL:4,CKMB:4,TROPONINI:4 in the last 72 hours  Coagulation Studies: No results found for this basename: LABPROT:5,INR:5 in the last 72 hours   Basename 08/09/11 1830  VITAMINB12 949*  FOLATE >20.0  FERRITIN 597*  TIBC 243  IRON 21*  RETICCTPCT 1.9        Medications:    Infusions:    Scheduled Medications:    .  aspirin EC  81 mg Oral Daily  . calcium carbonate  1 tablet Oral TID  . feeding supplement  237 mL Oral BID BM  . feeding supplement  1 Container Oral TID BM  . metoprolol  25 mg Oral BID  . mulitivitamin with minerals  1 tablet Oral Daily  . pantoprazole  40 mg Oral Q1200  . polyethylene glycol  17 g Oral Daily  . senna-docusate  1 tablet Oral BID    Assessment/ Plan:     FUO (fever of unknown origin) (06/26/2011)  Chronic systolic CONGESTIVE HEART FAILURE, LEFT (03/20/2010)   His heart failure is stable.   ICD - IN SITU (03/20/2010)  His generator battery is close to ERI.  We will ask EP to comment on just how long it will go .  We would prefer that he not  have a new device placed while he still has this FUO.   CAD (coronary artery disease) (07/10/2010)   Stable - no angina  Esophageal stricture (07/18/2011)   For EGD today followed by TEE   Length of Stay: 4  Alvia Grove., MD, [facility] 08/12/2011, 7:38 AM

## 2011-08-12 NOTE — Progress Notes (Signed)
*  PRELIMINARY RESULTS* Echocardiogram Echocardiogram Transesophageal has been performed.  Glean Salen [facility] 08/12/2011, 10:45 AM

## 2011-08-12 NOTE — Progress Notes (Addendum)
 PROGRESS NOTE  Subjective:   [name] is a 65 y.o. gentleman with hx of 3 V CAD - s/p CABG, CHF ( EF 25-30%), ICD admitted with recurrent fevers.  He has had these fevers for several months.  He has lost 30+ pounds.  He has been seen int he ID clinic and no definitive dx has been made yet.    We were unable to pass the TEE probe several weeks ago.  The plan is for him to have EGD today followed by TEE if his esophagus is OK.  The VS overnight suggest that he had a temp of 104.4.  He states this is an error and his temp was probably 100.4.     Objective:    Vital Signs:   Temp:  [99.2 F (37.3 C)-104.4 F (40.2 C)] 100 F (37.8 C) (05/13 0533) Pulse Rate:  [95-106] 106  (05/13 0533) Resp:  [18-20] 18  (05/13 0533) BP: (114-125)/(64-78) 123/70 mmHg (05/13 0533) SpO2:  [93 %-97 %] 95 % (05/13 0533) Weight:  [191 lb (86.637 kg)] 191 lb (86.637 kg) (05/12 2250)  Last BM Date: 08/11/11   24-hour weight change: Weight change: 3 lb 6.4 oz (1.542 kg)  Weight trends: Filed Weights   08/09/11 2012 08/10/11 2100 08/11/11 2250  Weight: 194 lb 14.2 oz (88.4 kg) 187 lb 9.6 oz (85.095 kg) 191 lb (86.637 kg)    Intake/Output:  05/12 0701 - 05/13 0700 In: 780 [P.O.:780] Out: 901 [Urine:900; Stool:1]     Physical Exam: BP 123/70  Pulse 106  Temp(Src) 100 F (37.8 C) (Rectal)  Resp 18  Ht 5' 10" (1.778 m)  Wt 191 lb (86.637 kg)  BMI 27.41 kg/m2  SpO2 95%  General: Vital signs reviewed and noted. Well-developed, well-nourished, in no acute distress; alert, appropriate and cooperative throughout examination.  Head: Normocephalic, atraumatic.  Eyes: conjunctivae/corneas clear. PERRL, EOM's intact. Fundi benign.  Throat: Oropharynx nonerythematous, no exudate appreciated.   Neck: Supple. Normal carotids. No JVD  Lungs:  Clear bilaterally to auscultation without wheezes, rales, or rhonchi. Breathing is unlabored.  Heart: Regular rate,  With normal  S1 S2. No murmurs, rubs, or  gallops   Abdomen:  Soft, non-tender, non-distended with normoactive bowel sounds. No hepatomegaly. No rebound/guarding. No abdominal masses.  Extremities: No edema.  Distal pedal pulses are 2+ and equal bilaterally.  Neurologic: A&O X3, CN II - XII are grossly intact. Motor strength is 5/5 in the all 4 extremities.  Psych: Responds to questions appropriately with normal affect.    Labs: BMET:  Basename 08/11/11 0839  NA 133*  K 4.5  CL 96  CO2 23  GLUCOSE 135*  BUN 13  CREATININE 1.02  CALCIUM 9.6  MG --  PHOS --    Liver function tests: No results found for this basename: AST:2,ALT:2,ALKPHOS:2,BILITOT:2,PROT:2,ALBUMIN:2 in the last 72 hours No results found for this basename: LIPASE:2,AMYLASE:2 in the last 72 hours  CBC:  Basename 08/11/11 0839  WBC 6.5  NEUTROABS --  HGB 12.2*  HCT 36.7*  MCV 82.1  PLT 125*    Cardiac Enzymes: No results found for this basename: CKTOTAL:4,CKMB:4,TROPONINI:4 in the last 72 hours  Coagulation Studies: No results found for this basename: LABPROT:5,INR:5 in the last 72 hours   Basename 08/09/11 1830  VITAMINB12 949*  FOLATE >20.0  FERRITIN 597*  TIBC 243  IRON 21*  RETICCTPCT 1.9        Medications:    Infusions:    Scheduled Medications:    .   aspirin EC  81 mg Oral Daily  . calcium carbonate  1 tablet Oral TID  . feeding supplement  237 mL Oral BID BM  . feeding supplement  1 Container Oral TID BM  . metoprolol  25 mg Oral BID  . mulitivitamin with minerals  1 tablet Oral Daily  . pantoprazole  40 mg Oral Q1200  . polyethylene glycol  17 g Oral Daily  . senna-docusate  1 tablet Oral BID    Assessment/ Plan:     FUO (fever of unknown origin) (06/26/2011)  Chronic systolic CONGESTIVE HEART FAILURE, LEFT (03/20/2010)   His heart failure is stable.   ICD - IN SITU (03/20/2010)  His generator battery is close to ERI.  We will ask EP to comment on just how long it will go .  We would prefer that he not  have a new device placed while he still has this FUO.   CAD (coronary artery disease) (07/10/2010)   Stable - no angina  Esophageal stricture (07/18/2011)   For EGD today followed by TEE   Length of Stay: 4  Tajai Suder J. Jullie Arps, Jr., MD, FACC 08/12/2011, 7:38 AM     

## 2011-08-12 NOTE — Progress Notes (Signed)
INTERNAL MEDICINE DAILY PROGRESS NOTE  KELIN BORUM MRN: [id number] DOB/AGE: 1945/07/19 66 y.o.  Admit date: 08/08/2011  Subjective: [name] states he had subjective fevers and chills overnight. States has some mild pain in his right hip. Had constipation over the weekend though this is resolved. He tells me as well that fever documented at 104.4 his mistaken should be 100.4. He was awaiting transfer to Endo when I visited him.   Denies headache, nausea, vomiting, chest pain, shortness of breath, diarrhea or constipation, abdominal pain, swelling or other complaint.  Objective: Vital signs in last 24 hours: Filed Vitals:   08/11/11 1800 08/11/11 2250 08/12/11 0212 08/12/11 0533  BP: 118/64 114/74  123/70  Pulse: 95 103  106  Temp: 100.9 F (38.3 C) 104.4 F (40.2 C) 99.2 F (37.3 C) 100 F (37.8 C)  TempSrc: Rectal Rectal Oral Rectal  Resp: 19 20  18   Height:      Weight:  191 lb (86.637 kg)    SpO2: 97% 97%  95%   Weight change: 3 lb 6.4 oz (1.542 kg)  Intake/Output Summary (Last 24 hours) at 08/12/11 0830 Last data filed at 08/12/11 0700  Gross per 24 hour  Intake    780 ml  Output    901 ml  Net   -121 ml   Physical Exam: General: resting in bed, not diaphoretic HEENT: PERRL, EOMI, no scleral icterus Cardiac: Slightly tachycardic, no rubs, murmurs or gallops Pulm: clear to auscultation bilaterally, moving normal volumes of air Abd: soft, nontender, nondistended, very active BS Ext: warm and well perfused, no pedal edema Neuro: alert and oriented X3, cranial nerves II-XII grossly intact Lab Results: Basic Metabolic Panel:  Lab 08/11/11 1191 08/09/11 0735  NA 133* 134*  K 4.5 5.0  CL 96 99  CO2 23 26  GLUCOSE 135* 100*  BUN 13 14  CREATININE 1.02 1.05  CALCIUM 9.6 9.4  MG -- --  PHOS -- --   Liver Function Tests:  Lab 08/08/11 1645  AST 32  ALT 23  ALKPHOS 72  BILITOT 0.3  PROT 7.9  ALBUMIN 2.9*   CBC:  Lab 08/11/11 0839 08/09/11  0735 08/08/11 1645  WBC 6.5 5.8 --  NEUTROABS -- -- 3.6  HGB 12.2* 12.0* --  HCT 36.7* 36.1* --  MCV 82.1 82.4 --  PLT 125* 87* --   Cardiac Enzymes:  Lab 08/09/11 0735 08/08/11 2126 08/08/11 1645  CKTOTAL 14 20 18   CKMB 1.2 1.1 1.2  CKMBINDEX -- -- --  TROPONINI <0.30 <0.30 <0.30   BNP:  Lab 08/08/11 1645  PROBNP 522.1*   Fasting Lipid Panel:  Lab 08/09/11 0735  CHOL 138  HDL 16*  LDLCALC 78  TRIG 478*  CHOLHDL 8.6  LDLDIRECT --   Thyroid Function Tests:  Lab 08/08/11 1645  TSH 1.523  T4TOTAL --  FREET4 --  T3FREE --  THYROIDAB --   Anemia Panel:  Lab 08/09/11 1830  VITAMINB12 949*  FOLATE >20.0  FERRITIN 597*  TIBC 243  IRON 21*  RETICCTPCT 1.9   Urinalysis:  Lab 08/08/11 1646  COLORURINE AMBER*  LABSPEC 1.026  PHURINE 5.5  GLUCOSEU NEGATIVE  HGBUR NEGATIVE  BILIRUBINUR SMALL*  KETONESUR NEGATIVE  PROTEINUR NEGATIVE  UROBILINOGEN 1.0  NITRITE NEGATIVE  LEUKOCYTESUR NEGATIVE   Misc. Labs: Uric acid 5.5 RPR nonreactive Cryptococcal antigen negative HIV nonreactive Fecal occult blood test negative    Micro Results: Recent Results (from the past 240 hour(s))  SURGICAL PCR  SCREEN     Status: Abnormal   Collection Time   08/02/11 11:56 AM      Component Value Range Status Comment   MRSA, PCR POSITIVE (*) NEGATIVE  Final    Staphylococcus aureus POSITIVE (*) NEGATIVE  Final   CULTURE, BLOOD (ROUTINE X 2)     Status: Normal (Preliminary result)   Collection Time   08/08/11  4:45 PM      Component Value Range Status Comment   Specimen Description BLOOD LEFT ARM   Final    Special Requests BOTTLES DRAWN AEROBIC AND ANAEROBIC 10CC   Final    Culture  Setup Time [id number]   Final    Culture     Final    Value:        BLOOD CULTURE RECEIVED NO GROWTH TO DATE CULTURE WILL BE HELD FOR 5 DAYS BEFORE ISSUING A FINAL NEGATIVE REPORT   Report Status PENDING   Incomplete   CULTURE, BLOOD (ROUTINE X 2)     Status: Normal (Preliminary result)    Collection Time   08/08/11  4:45 PM      Component Value Range Status Comment   Specimen Description BLOOD LEFT ARM   Final    Special Requests BOTTLES DRAWN AEROBIC ONLY 3CC   Final    Culture  Setup Time [id number]   Final    Culture     Final    Value:        BLOOD CULTURE RECEIVED NO GROWTH TO DATE CULTURE WILL BE HELD FOR 5 DAYS BEFORE ISSUING A FINAL NEGATIVE REPORT   Report Status PENDING   Incomplete   MRSA PCR SCREENING     Status: Normal   Collection Time   08/08/11  9:28 PM      Component Value Range Status Comment   MRSA by PCR NEGATIVE  NEGATIVE  Final   AFB CULTURE, BLOOD     Status: Normal (Preliminary result)   Collection Time   08/09/11  9:00 PM      Component Value Range Status Comment   Specimen Description BLOOD ARM RIGHT   Final    Special Requests 5CC AFB   Final    Culture     Final    Value: CULTURE WILL BE EXAMINED FOR 6 WEEKS BEFORE ISSUING A FINAL REPORT   Report Status PENDING   Incomplete    Studies/Results: Ct Hip Right W Contrast  08/10/2011  *RADIOLOGY REPORT*  Clinical Data: Right hip pain.  Fever of unknown origin.  CT OF THE RIGHT HIP WITH CONTRAST  Technique:  Multidetector CT imaging was performed following the standard protocol during bolus administration of intravenous contrast.  Contrast: OMNIPAQUE IOHEXOL 300 MG/ML  SOLN  Comparison: None.  Findings: No fluid collection is identified.  Subcutaneous and muscular tissues are unremarkable.  Fat containing right inguinal hernia is incidentally noted.  No hip joint effusion is identified. No pathologic enhancement after contrast administration is seen. The hip is located.  There is no fracture.  No periosteal reaction or bony destructive change is seen.  IMPRESSION:  1.  No acute finding or finding to explain the patient's fever. 2.  Fat containing inguinal hernia is incidentally noted.  Original Report Authenticated By: Bernadene Bell. Maricela Curet, M.D.    Endoscopy: Showed severe esophageal candidiasis  no obvious stricture  Transesophageal echocardiogram: Showed probable mass on the patient's right atrial lead   Medications: I have reviewed the patient's current medications. Scheduled Meds:   .  aspirin EC  81 mg Oral Daily  . calcium carbonate  1 tablet Oral TID  . feeding supplement  237 mL Oral BID BM  . feeding supplement  1 Container Oral TID BM  . metoprolol  25 mg Oral BID  . mulitivitamin with minerals  1 tablet Oral Daily  . pantoprazole  40 mg Oral Q1200  . polyethylene glycol  17 g Oral Daily  . senna-docusate  1 tablet Oral BID   Continuous Infusions:  PRN Meds:.diphenhydrAMINE, docusate sodium, ondansetron (ZOFRAN) IV, ondansetron, traMADol, DISCONTD: acetaminophen Assessment/Plan:  Pt is a 66 y.o. 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 Patient has severe esophageal candidiasis per EGD done 08/12/2011 also probable mass on right atrial lead shown on TEE 08/12/2011. Obvious concern is whether or not esophageal candidiasis has led to candidemia and subsequent seeding of his hardware.  1) FUO - Tmax 100.48F lat night at 1800. Etiology still unclear probably now related to esophageal infection with candida.  Obvious concern is whether or not esophageal candidiasis has led to candidemia and subsequent seeding of his hardware.  Still unclear why patient has esophageal candidiasis. Question acquired immunologic deficiency or process in the bone marrow that is making him both anemic and thrombocytopenic as well immune deficient. Perhaps our hematology consult today will provide insight into this. Thus far, labs for her FUO show negative, uric acid, HIV, RPR, Cryptococcal Ag, and CT hip. Peripheral smear showed anemia and thrombocytopenia only. Still awaiting multiple tests including: quantiferon gold, SPEP/UPEP, testing for  brucellosis, q fever, HLA B27, BCx for leptospirosis. Malignancy , perhaps in the bone marrow is a possibility. Rheumatologic condition less likely now with esophageal candidiasis. - Consider BM bx to look for occult malignancy/infection.  - Appreciate ID, GI, cardiology assistance and input in the management of our patient.  - Will follow-up pending ID anti-CCP antibodies to evaluate for possible RA.  - Fluconazole 200 mg by mouth daily - Hematology consult called today (fever of unknown origin, thrombocytopenia, possible bone marrow biopsy)  2) GERD/Esophageal stricture - aggressive and leading to decreased by mouth intake. Head EGD with dilation today based on prior histories, even though no obvious stricture was found - Appreciate GI assistance and input in the management of our patient  - Continue nutrition for now with full liquid diet or mechanical soft as the patient tolerates  - appreciate nutrition's help  3) Ischemic cardiomyopathy - Stable. Cardiomyopathy and systolic heart failure could be aggravating progressive functional decline. Appreciate cardiology recommendations.   - Advanced metoprolol to BID  - Continue ASA  - Will consider low dose ARB as blood pressures stabilize.   4) ICD - IN SITU - . Will defer to cardiology for further management of ICD. Appreciate their assistance.   5) CAD (coronary artery disease) with hx of CABG x 4 vessels (2003) - stable.  - continue Aspirin  - continue BID metoprolol  - The patient appears to have statin allergy, so we will not initiate this at this time.   6) Weight loss, unintentional - this could be secondary to fever of unknown origin (and it's underlying cause) versus inability to maintain by mouth intake given esophageal stricture.  - Appreciate Nutrition assistance and input in the management of our patient.  - Appreciate GI assistance and input in the management of our patient.   7) Normocytic anemia -  anemia is acute and has  developed since March when hemoglobin was 14. Anemia panel showing Fe 21, TIBC 241, ferritin 597. Although most consistent with an anemia of chronic disease, transferrin saturation is low, as well, ferritin may be acutely elevated 2/2 to the fevers. Therefore, difficulty to ascertain definitively for now. Pt has last colonoscopy in 2012 showing sigmoid and descending diverticula (but no recurrent polyps - last noted to have hyperplastic polyps in 2001). Reticulocyte index based on reticulocyte % of 1.9 and HCT 36.1 is 1.01. This may indicate hypoproliferation, may be due to his iron deficiency or his anemia of chronic disease, but could also be malignancy related.  FOBT negative. Peripheral smear showed only anemia with thrombocytopenia and no atypical morphologies. - Hematology consulted  8) Thrombocytopenia - Most likely related to principal problem.  - Obtain peripheral smear  - Will hold heparin products given thrombocytopenia   9) Constipation - likely also related to limited oral intake.  - Will add senna-s and miralax.   DVT proph: SCDs    LOS: 4 days   Liller Yohn 08/12/2011, 8:30 AM

## 2011-08-12 NOTE — CV Procedure (Signed)
See results in [employer]; probable large density on right atrial lead. [name]

## 2011-08-12 NOTE — Op Note (Signed)
[name] [address] [place], [place]  [address]  ENDOSCOPY PROCEDURE REPORT  PATIENT:  [name], [name]  MR#:  [id number] BIRTHDATE:  10-Oct-1945, 65 yrs. old  GENDER:  male  ENDOSCOPIST:  Dorena Cookey Referred by:  PROCEDURE DATE:  08/12/2011 PROCEDURE: ASA CLASS: INDICATIONS:  MEDICATIONS: 75 mcg fentanyl, 6 mg Versed  TOPICAL ANESTHETIC: Cetacaine  DESCRIPTION OF PROCEDURE:   After the risks benefits and alternatives of the procedure were thoroughly explained, informed consent was obtained.  The Pentax Gastroscope [id number] endoscope was introduced through the mouth and advanced to the , without limitations.  The instrument was slowly withdrawn as the mucosa was fully examined. <<PROCEDUREIMAGES>>  FINDINGS:  Diffuse white curd-like elevated plaques, some confluent consistent with severe candidiasis. Underlying mucosa not well seen somewhat friable but no visible or endoscopic suggestion of stricture and the scope passed easily the entire length of the esophagus. There was a small hiatal hernia with no discernible ring. Retroflex view the cardia was unremarkable. Fundus body antrum and pylorus all appeared normal. The duodenum was entered the bulb and second portion are well inspected and appeared be within normal limits  . Savary dilators of 1214 and 15 mm for passed under fluoroscopic visualization with mild resistance and a small amount of blood seen on the last 2 dilators.  COMPLICATIONS: None immediately  ENDOSCOPIC IMPRESSION:  Severe candidal esophagitis, no obvious stricture but based on previous findings and response to dilatation, empiric dilatation performed.  RECOMMENDATIONS: Oral Diflucan.  REPEAT EXAM:  When necessary. Transesophageal echocardiogram to follow this procedure.  ______________________________ Dorena Cookey  CC:  n. eSIGNED:   Dorena Cookey at 08/12/2011 10:08 AM  Theodoro Parma, [phone]

## 2011-08-12 NOTE — Progress Notes (Signed)
[facility] Progress Note  Subjective: Symptoms basically unchanged  Objective: Vital signs in last 24 hours: Temp:  [97.9 F (36.6 [name])-104.4 F (40.2 [name])] 97.9 F (36.6 [name]) (05/13 0844) Pulse Rate:  [95-106] 99  (05/13 0844) Resp:  [10-40] 33  (05/13 1005) BP: (114-165)/(63-106) 165/106 mmHg (05/13 1005) SpO2:  [92 %-97 %] 96 % (05/13 1005) Weight:  [86.637 kg (191 lb)] 86.637 kg (191 lb) (05/12 2250) Weight change: 1.542 kg (3 lb 6.4 oz)   PE: Unchanged  Lab Results: Results for orders placed during the hospital encounter of 08/08/11 (from the past 24 hour(s))  OCCULT BLOOD X 1 CARD TO LAB, STOOL     Status: Normal   Collection Time   08/11/11  5:38 PM      Component Value Range   Fecal Occult Bld NEGATIVE      Studies/Results: Ct Hip Right W Contrast  08/10/2011  *RADIOLOGY REPORT*  Clinical Data: Right hip pain.  Fever of unknown origin.  CT OF THE RIGHT HIP WITH CONTRAST  Technique:  Multidetector CT imaging was performed following the standard protocol during bolus administration of intravenous contrast.  Contrast: OMNIPAQUE IOHEXOL 300 MG/ML  SOLN  Comparison: None.  Findings: No fluid collection is identified.  Subcutaneous and muscular tissues are unremarkable.  Fat containing right inguinal hernia is incidentally noted.  No hip joint effusion is identified. No pathologic enhancement after contrast administration is seen. The hip is located.  There is no fracture.  No periosteal reaction or bony destructive change is seen.  IMPRESSION:  1.  No acute finding or finding to explain the patient's fever. 2.  Fat containing inguinal hernia is incidentally noted.  Original Report Authenticated By: Bernadene Bell. D'ALESSIO, M.D.   EGD: Severe candidal esophagitis, brushings obtained, no obvious impression of stricture. Savary dilators of 1214 and 15 mm were passed empirically and carefully with minimal resistance under fluoroscopic evaluation. Transesophageal echocardiogram  to be attempted just after this procedure.   Assessment: Candidal esophagitis, unclear whether primary etiology to his presentation or secondary phenomenon, no definitely endoscopically defined stricture  Plan: Discussed with Dr. Orvan Falconer. Will treat with Diflucan 200 mg day 1 and 100 mg by mouth. Await brushings to confirm candidiasis and TEE planned by cardiology this morning.    [name] 08/12/2011, 10:16 AM

## 2011-08-13 ENCOUNTER — Encounter (HOSPITAL_COMMUNITY): Payer: Self-pay | Admitting: Gastroenterology

## 2011-08-13 DIAGNOSIS — T827XXA Infection and inflammatory reaction due to other cardiac and vascular devices, implants and grafts, initial encounter: Principal | ICD-10-CM

## 2011-08-13 LAB — PROTEIN ELECTROPHORESIS, SERUM
Albumin ELP: 40.8 % — ABNORMAL LOW (ref 55.8–66.1)
Alpha-1-Globulin: 8.4 % — ABNORMAL HIGH (ref 2.9–4.9)
Total Protein ELP: 6.8 g/dL (ref 6.0–8.3)

## 2011-08-13 LAB — BASIC METABOLIC PANEL WITH GFR
BUN: 15 mg/dL (ref 6–23)
CO2: 27 meq/L (ref 19–32)
Calcium: 9.6 mg/dL (ref 8.4–10.5)
Chloride: 97 meq/L (ref 96–112)
Creatinine, Ser: 1.14 mg/dL (ref 0.50–1.35)
GFR calc Af Amer: 76 mL/min — ABNORMAL LOW (ref >90)
GFR calc non Af Amer: 66 mL/min — ABNORMAL LOW (ref >90)
Glucose, Bld: 107 mg/dL — ABNORMAL HIGH (ref 70–99)
Potassium: 5.2 meq/L — ABNORMAL HIGH (ref 3.5–5.1)
Sodium: 132 meq/L — ABNORMAL LOW (ref 135–145)

## 2011-08-13 LAB — CHLAMYDIA ANTIBODIES, IGG
Chlamydia Pneumoniae IgG: 1:64 {titer} — ABNORMAL HIGH
Chlamydia Psittaci IgG: <1:64 {titer}
Chlamydia psittaci Ab IgM: <1:10 {titer}
Chlamydia trachomatis IgG: <1:64 {titer}

## 2011-08-13 LAB — DIFFERENTIAL
Basophils Absolute: 0.1 10*3/uL (ref 0.0–0.1)
Eosinophils Relative: 1 % (ref 0–5)
Monocytes Absolute: 0.6 10*3/uL (ref 0.1–1.0)
Monocytes Relative: 9 % (ref 3–12)
Neutrophils Relative %: 60 % (ref 43–77)

## 2011-08-13 LAB — CBC
MCH: 27.1 pg (ref 26.0–34.0)
Platelets: 141 10*3/uL — ABNORMAL LOW (ref 150–400)
RBC: 4.36 MIL/uL (ref 4.22–5.81)
RDW: 15.2 % (ref 11.5–15.5)
WBC: 7.1 10*3/uL (ref 4.0–10.5)

## 2011-08-13 MED ORDER — MICAFUNGIN SODIUM 50 MG IV SOLR
150.0000 mg | Freq: Every day | INTRAVENOUS | Status: DC
  Administered 2011-08-13 – 2011-08-23 (×11): 150 mg via INTRAVENOUS
  Filled 2011-08-13 (×27): qty 150

## 2011-08-13 NOTE — Progress Notes (Signed)
08/13/2011 monitor tech called patient have 5 beat run of v-tach. Vital signs were 121/75, 98.9 orally , 99.8 rectally, 100, 95%RA, 20. MD is aware and no orders given. Continue to monitor. Biochemist, clinical.

## 2011-08-13 NOTE — Progress Notes (Signed)
Patient had Temp of  101.3.Dr schooler notified,order received.Offered patient tylenol but he refused.Will continue to monitor. Jerik Falletta Joselita,RN

## 2011-08-13 NOTE — Progress Notes (Signed)
IM Attending  Has 2-3 cm mass on pacer lead, presumably related to extensive esophageal candidiasis.  There is also some concern about light chains in his urine.  ID and cardiology are on board.  If UPEP is monoclonal, will consult Heme.

## 2011-08-13 NOTE — Progress Notes (Signed)
Medical Student Daily Progress Note  Subjective: Patient is doing well this morning. He does note that he has not had a BM since yesterday however he has not had much of an appetite. He had a fever overnight 101.3 and blood cultures were drawn. He says he was told that he will have surgery on Thursday to remove the mass/vegetation that is presumably on his right atrial lead discovered on TEE yesterday s/p esophageal dilation.   Objective: Vital signs in last 24 hours: Filed Vitals:   08/13/11 0510 08/13/11 0857 08/13/11 0901 08/13/11 0930  BP: 113/70 121/75  128/69  Pulse: 97 100  100  Temp: 99.8 F (37.7 C) 98.9 F (37.2 C) 99.8 F (37.7 C) 98.2 F (36.8 C)  TempSrc: Rectal Oral Rectal Oral  Resp: 22 20  20   Height:      Weight:      SpO2: 97% 95%  96%   Weight change: 0.363 kg (12.8 oz)  Intake/Output Summary (Last 24 hours) at 08/13/11 1221 Last data filed at 08/13/11 0900  Gross per 24 hour  Intake   1100 ml  Output    900 ml  Net    200 ml   Physical Exam: BP 128/69  Pulse 100  Temp(Src) 98.2 F (36.8 C) (Oral)  Resp 20  Ht 5\' 10"  (1.778 m)  Wt 87 kg (191 lb 12.8 oz)  BMI 27.52 kg/m2  SpO2 96% General appearance: alert, cooperative and no distress Head: Normocephalic, without obvious abnormality, atraumatic Eyes: conjunctivae/corneas clear. PERRL, EOM's intact. Fundi benign. Neck: no adenopathy, no carotid bruit, no JVD, supple, symmetrical, trachea midline and thyroid not enlarged, symmetric, no tenderness/mass/nodules Back: symmetric, no curvature. ROM normal. No CVA tenderness. Lungs: clear to auscultation bilaterally Chest wall: no tenderness Heart: regular rate and rhythm, S1, S2 normal, no murmur, click, rub or gallop Abdomen: soft, non-tender; bowel sounds normal; no masses,  no organomegaly Extremities: extremities normal, atraumatic, no cyanosis or edema Skin: Skin color, texture, turgor normal. No rashes or lesions Lab Results: Basic Metabolic  Panel:  Lab 08/13/11 0540 08/11/11 0839  NA 132* 133*  K 5.2* 4.5  CL 97 96  CO2 27 23  GLUCOSE 107* 135*  BUN 15 13  CREATININE 1.14 1.02  CALCIUM 9.6 9.6  MG -- --  PHOS -- --   Liver Function Tests:  Lab 08/08/11 1645  AST 32  ALT 23  ALKPHOS 72  BILITOT 0.3  PROT 7.9  ALBUMIN 2.9*   CBC:  Lab 08/13/11 0540 08/11/11 0839 08/08/11 1645  WBC 7.1 6.5 --  NEUTROABS 4.2 -- 3.6  HGB 11.8* 12.2* --  HCT 35.8* 36.7* --  MCV 82.1 82.1 --  PLT 141* 125* --   Cardiac Enzymes:  Lab 08/09/11 0735 08/08/11 2126 08/08/11 1645  CKTOTAL 14 20 18   CKMB 1.2 1.1 1.2  CKMBINDEX -- -- --  TROPONINI <0.30 <0.30 <0.30   BNP:  Lab 08/08/11 1645  PROBNP 522.1*   Fasting Lipid Panel:  Lab 08/09/11 0735  CHOL 138  HDL 16*  LDLCALC 78  TRIG 161*  CHOLHDL 8.6  LDLDIRECT --   Thyroid Function Tests:  Lab 08/08/11 1645  TSH 1.523  T4TOTAL --  FREET4 --  T3FREE --  THYROIDAB --  Anemia Panel:  Lab 08/09/11 1830  VITAMINB12 949*  FOLATE >20.0  FERRITIN 597*  TIBC 243  IRON 21*  RETICCTPCT 1.9   Urinalysis:  Lab 08/08/11 1646  COLORURINE AMBER*  LABSPEC 1.026  PHURINE 5.5  GLUCOSEU NEGATIVE  HGBUR NEGATIVE  BILIRUBINUR SMALL*  KETONESUR NEGATIVE  PROTEINUR NEGATIVE  UROBILINOGEN 1.0  NITRITE NEGATIVE  LEUKOCYTESUR NEGATIVE    Micro Results: Recent Results (from the past 240 hour(s))  CULTURE, BLOOD (ROUTINE X 2)     Status: Normal (Preliminary result)   Collection Time   08/08/11  4:45 PM      Component Value Range Status Comment   Specimen Description BLOOD LEFT ARM   Final    Special Requests BOTTLES DRAWN AEROBIC AND ANAEROBIC 10CC   Final    Culture  Setup Time [id number]   Final    Culture     Final    Value:        BLOOD CULTURE RECEIVED NO GROWTH TO DATE CULTURE WILL BE HELD FOR 5 DAYS BEFORE ISSUING A FINAL NEGATIVE REPORT   Report Status PENDING   Incomplete   CULTURE, BLOOD (ROUTINE X 2)     Status: Normal (Preliminary result)    Collection Time   08/08/11  4:45 PM      Component Value Range Status Comment   Specimen Description BLOOD LEFT ARM   Final    Special Requests BOTTLES DRAWN AEROBIC ONLY 3CC   Final    Culture  Setup Time [id number]   Final    Culture     Final    Value:        BLOOD CULTURE RECEIVED NO GROWTH TO DATE CULTURE WILL BE HELD FOR 5 DAYS BEFORE ISSUING A FINAL NEGATIVE REPORT   Report Status PENDING   Incomplete   MRSA PCR SCREENING     Status: Normal   Collection Time   08/08/11  9:28 PM      Component Value Range Status Comment   MRSA by PCR NEGATIVE  NEGATIVE  Final   AFB CULTURE, BLOOD     Status: Normal (Preliminary result)   Collection Time   08/09/11  9:00 PM      Component Value Range Status Comment   Specimen Description BLOOD ARM RIGHT   Final    Special Requests 5CC AFB   Final    Culture     Final    Value: CULTURE WILL BE EXAMINED FOR 6 WEEKS BEFORE ISSUING A FINAL REPORT   Report Status PENDING   Incomplete   AFB CULTURE, BLOOD     Status: Normal (Preliminary result)   Collection Time   08/13/11 12:45 AM      Component Value Range Status Comment   Specimen Description BLOOD RIGHT HAND   Final    Special Requests 5CC   Final    Culture     Final    Value: CULTURE WILL BE EXAMINED FOR 6 WEEKS BEFORE ISSUING A FINAL REPORT   Report Status PENDING   Incomplete    Studies/Results: Dg Esophagus Dilatation  08/12/2011  CLINICAL DATA: stricture   ESOPHAGEAL DILATATION  Fluoroscopy was provided for use by the requesting physician.  No images  were obtained for radiographic interpretation.     Medications:  I have reviewed the patient's current medications. Prior to Admission:  Prescriptions prior to admission  Medication Sig Dispense Refill  . Ascorbic Acid (VITAMIN C) 100 MG tablet Take 100 mg by mouth daily.      . calcium carbonate (TUMS - DOSED IN MG ELEMENTAL CALCIUM) 500 MG chewable tablet Chew 1 tablet by mouth 3 (three) times daily.      .  diphenhydramine-acetaminophen (TYLENOL PM) 25-500 MG TABS Take  1 tablet by mouth at bedtime as needed. For pain      . esomeprazole (NEXIUM) 40 MG capsule Take 40 mg by mouth 2 (two) times daily.       . famotidine (PEPCID) 10 MG tablet Take 10 mg by mouth 2 (two) times daily.      . Ibuprofen 200 MG CAPS Take 600-800 mg by mouth every 6 (six) hours as needed. Depending on fever      . metoprolol (LOPRESSOR) 50 MG tablet Take 25 mg by mouth daily. Takes in am      . Multiple Vitamins-Minerals (MULTIVITAMIN) tablet Take 1 tablet by mouth daily with breakfast.   30 tablet    . Phenylephrine-Acetaminophen 5-325 MG TABS Take 1 tablet by mouth every 4 (four) hours as needed. For sinus headache      . traMADol (ULTRAM) 50 MG tablet Take 1 tablet (50 mg total) by mouth every 6 (six) hours as needed for pain.  20 tablet  0  . DISCONTD: acetaminophen (TYLENOL) 500 MG tablet Take 500 mg by mouth every 6 (six) hours as needed. For pain.      [name] DISCONTD: Aspirin-Salicylamide-Caffeine (BC HEADACHE POWDER PO) Take 1 Package by mouth daily as needed. For headaches.      [name] DISCONTD: Flaxseed, Linseed, 1000 MG CAPS Take 1 capsule by mouth daily.       Anti-infectives     Start     Dose/Rate Route Frequency Ordered Stop   08/13/11 1200   micafungin (MYCAMINE) 150 mg in sodium chloride 0.9 % 100 mL IVPB     Comments: Wait until fungal blood cultures are drawn before starting      150 mg 100 mL/hr over 1 Hours Intravenous Daily 08/13/11 0948     08/13/11 1000   fluconazole (DIFLUCAN) tablet 100 mg  Status:  Discontinued        100 mg Oral Daily 08/12/11 1125 08/13/11 0948   08/12/11 1200   fluconazole (DIFLUCAN) tablet 200 mg        200 mg Oral Daily 08/12/11 1125 08/12/11 1426         Scheduled Meds:   . aspirin EC  81 mg Oral Daily  . calcium carbonate  1 tablet Oral TID  . feeding supplement  237 mL Oral BID BM  . feeding supplement  1 Container Oral TID BM  . fluconazole  200 mg Oral Daily  .  metoprolol  25 mg Oral BID  . micafungin [facility]) IV  150 mg Intravenous Daily  . mulitivitamin with minerals  1 tablet Oral Daily  . pantoprazole  40 mg Oral Q1200  . polyethylene glycol  17 g Oral Daily  . senna-docusate  1 tablet Oral BID  . DISCONTD: fluconazole  100 mg Oral Daily   Continuous Infusions:  PRN Meds:.acetaminophen, diphenhydrAMINE, docusate sodium, ondansetron (ZOFRAN) IV, ondansetron, traMADol Assessment/Plan:  Pt is a 66 y.o. yo male with a PMHx of CAD s/p CABG, recurrent candida and esophageal strictures requiring multiple prior dilatations who was admitted on 08/08/2011 with symptoms of weakness, recurrent fevers, weight loss. Patient has severe esophageal candidiasis per EGD done 08/12/2011 also probable mass on right atrial lead shown on TEE 08/12/2011. Obvious concern is whether or not esophageal candidiasis has led to candidemia and subsequent seeding of his hardware. He is scheduled for surgery by Dr. Ladona Ridgel on 08/15/11 for removal of right atrial artifact.   1) FUO - Tmax 101.3 last night @ 2300. Blood cultures drawn  x 2. Further questioning revealed today that patient has had past episodes of candida causing him to feel the tickle in his throat and dysphagia for which he would undergo dilation and a course of fluconazole. That is how he knows the candida has come back each time he says, is when he feels the tickle and dysphagia. Underlying question remains what is causing his immunosuppression enough to cause candidiasis. 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 SPEP still pending for clonal identification  Negative labs to date consist of: uric acid, HIV, RPR, Cryptococcal Ag, CT hip, cyclic citrul Ab IgG, MRSA neg Tests pending consist of: quantiferon gold, brucellosis, q fever, HLA B27, leptospirosis, SPEP -Appreciate ID, GI, cardiology, CVTS assistance in the care and management of this patient -CVTS scheduled for 08/15/11  for removal of offending right atrial vegetation/mass -BM bx still a considerable option in setting of increased kappa/lambda ratio -fungal cx added to blood cx drawn from last night; d/c fluconazole and start micafungin per ID rec  2) GERD/Esophageal stricture - He is s/p dilation on 08/12/11. He has had a history of esophageal dilation s/p strictures and candidiasis and poor po intake. -Diet has been advancing slowly, he will try heart healthy diet today -continue Protonix daily  3) Ischemic cardiomyopathy - Stable. -Appreciate cardiology recommendations. -continue metoprolol BID -continue ASA  4) ICD in situ - Will defer to cardiology for further management of ICD. Appreciate their assistance.   5) CAD h/o CABG x 4 (2003) - Stable. CE negative x 3 on admission.  -continue metoprolol -continue ASA -no statin 2/2 allergy  6) Weight loss, unintentional - His TSH is WNL (1.523). This is likely 2/2 his underlying principal problem but also his esophageal stricture is a large component of this in the past.  -Appreciate GI and nutrition assistance in management of this patient -diet advanced to heat diet today  7) Normocytic anemia - This is acute and has developed since March when hemoglobin was 14. Anemia panel revealing: Iron = 21, Sat ratio = 9, TIBC =243, Ferritin = 597, Retic count =1.9, FOBT negative. Smear revealed mild anemia with normal morphology. Still consistent with AOCD given that TIBC is normal (elevated in IDA) and ferritin high although could still be a component of acute phase reactant. Pt has last colonoscopy in 2012 showing sigmoid and descending diverticula (but no recurrent polyps - last noted to have hyperplastic polyps in 2001).   8) Thrombocytopenia - likely related to underlying principal problem. Smear also reveals thrombocytopenia. -heparin products on hold  9) Constipation - likely 2/2 poor po intake -continue docusate, miralax, and senokot-S  10) Angular  chelitis - He has developed this quite a while ago, but he has noted that he has had it in the past and was given a "cream" and it went away once but then came back. Multiple causes for this include extension of Candida infection vs. Vitamin B2 deficiency vs. Iron deficiency anemia, all of which are treatable conditions in this patient. As final diagnosis nears closer, treatment options for this will narrow as well.  11) DVT ppx - SCD's  12) Disposition - His diet was advanced to heart diet today, he will undergo surgery Thursday, otherwise he is stable. Cultures and remaining tests pending in the meantime.    LOS: 5 days   This is a Psychologist, occupational Note.  The care of the patient was discussed with Dr. Quentin Ore and the assessment and plan  formulated with their assistance.  Please see their attached note for official documentation of the daily encounter.  [name] 08/13/2011, 12:21 PM    IMTS RESIDENT ADDENDUM    Resident Co-sign Daily Note: I have seen the patient and reviewed the daily progress note by [name] MSIII and discussed the care of the patient with them.  See below for documentation of my findings, assessment, and plans.  Subjective: [name] is happy to finally found a reason for his fevers. He states he is relieved to finally have a plan. He states he has had some fevers and mild chills but no sweats. He denies headache, chest pain, shortness of breath, abdominal pain, nausea, vomiting, diarrhea, constipation or other complaint.  Objective: Vital signs in last 24 hours: Filed Vitals:   08/13/11 0857 08/13/11 0901 08/13/11 0930 08/13/11 1410  BP: 121/75  128/69 137/61  Pulse: 100  100 96  Temp: 98.9 F (37.2 C) 99.8 F (37.7 C) 98.2 F (36.8 C) 97.4 F (36.3 C)  TempSrc: Oral Rectal Oral Oral  Resp: 20  20 20   Height:      Weight:      SpO2: 95%  96% 96%   Physical Exam: General: resting in bed HEENT: PERRL, EOMI, no scleral icterus Cardiac:  RRR, no rubs, murmurs or gallops Pulm: clear to auscultation bilaterally, moving normal volumes of air Abd: soft, nontender, nondistended, BS present Ext: warm and well perfused, no pedal edema Neuro: alert and oriented X3, cranial nerves II-XII grossly intact  Lab Results: Reviewed and documented in Electronic Record Micro Results: Reviewed and documented in Electronic Record Studies/Results: Reviewed and documented in Electronic Record Medications: I have reviewed the patient's current medications. Scheduled Meds:   . aspirin EC  81 mg Oral Daily  . calcium carbonate  1 tablet Oral TID  . feeding supplement  237 mL Oral BID BM  . feeding supplement  1 Container Oral TID BM  . metoprolol  25 mg Oral BID  . micafungin [facility]) IV  150 mg Intravenous Daily  . mulitivitamin with minerals  1 tablet Oral Daily  . pantoprazole  40 mg Oral Q1200  . polyethylene glycol  17 g Oral Daily  . senna-docusate  1 tablet Oral BID  . DISCONTD: fluconazole  100 mg Oral Daily   Continuous Infusions:  PRN Meds:.acetaminophen, diphenhydrAMINE, docusate sodium, ondansetron (ZOFRAN) IV, ondansetron, traMADol Assessment/Plan: 1) fever of unknown origin - origin likely secondary to chronic esophageal candidiasis with occasional dilations. This is likely lead to transient fungemia with seeding of his pacemaker wires. Patient will require surgical removal of pacemaker wires and culture of vegetation. Patient is currently on micafungin therapy which will cover candidiasis. -- Continue micafungin -- surgery planned for thursday -- Greatly appreciate the assistance of all consulting teams regarding this very complex and intriguing case  2) GERD/Esophageal stricture - is post procedure day 1 from EGD and dilation on 08/12/11. Advancing diet as tolerated. -continue Protonix daily  3) possible multiple myeloma  - elevated protein gap of 5 CNet admission. M protein spike seen on SPEP. Ordered immunofixation  electrophoresis to confirm diagnosis. We'll obtain hematology consult in the morning. We would like them to evaluate the patient as soon as possible in advance of surgery planned for Thursday. This could be the acute cause of his recent decline or at least a factor in his immunodeficiency that predisposed him to esophageal candidiasis. -- We'll obtain hematology consult -- Followup immunofixation electrophoresis  4) ICD in  situ - patient will require removal of his ICD for vegetation found on right atrial pacemaker lead. Will defer to cardiology for further management of ICD.   5) Ischemic cardiomyopathy - Stable. -Appreciate cardiology recommendations. -continue metoprolol BID -continue ASA  6) Weight loss, unintentional - likely secondary to fever of unknown origin complicated by dysphagia from esophageal stricture and esophageal candidiasis. Diet is now heart healthy  We'll continue supplementation.  7) Normocytic anemia - This is acute and has developed since March when hemoglobin was 14. Anemia panel revealing: Iron = 21, Sat ratio = 9, TIBC =243, Ferritin = 597, Retic count =1.9, FOBT negative. Smear revealed mild anemia with normal morphology. Still consistent with AOCD given that TIBC is normal (elevated in IDA) and ferritin high although could still be a component of acute phase reactant. Pt has last colonoscopy in 2012 showing sigmoid and descending diverticula (but no recurrent polyps - last noted to have hyperplastic polyps in 2001).   8) Thrombocytopenia -  Smear also confirms thrombocytopenia.  Could be secondary to either chronic infection or multiple myeloma. Will pursue valuation and treatment of aforementioned problems. -heparin products on hold  9) Constipation - resolved with bowel movement today. -continue docusate, miralax, and senokot-S  10) CAD h/o CABG x 4 (2003) - Stable. CE negative x 3 on admission. Unable to take statins or ACE inhibitors. -continue  metoprolol -continue ASA  11) DVT ppx - SCD's  12) Disposition - pending open-heart surgery for removal of vegetation on pacemaker wire and workup of immune suppression.    LOS: 5 days   [name] 08/13/2011, 5:57 PM

## 2011-08-13 NOTE — Progress Notes (Signed)
INFECTIOUS DISEASE PROGRESS NOTE  ID: [name] is a 66 y.o. male with long history of FUO and extensive work up, weight loss and now with vegetation noted on leads.    Subjective: Patient feels ok overall, tired  Abtx:  Anti-infectives     Start     Dose/Rate Route Frequency Ordered Stop   08/13/11 1000   fluconazole (DIFLUCAN) tablet 100 mg        100 mg Oral Daily 08/12/11 1125     08/12/11 1200   fluconazole (DIFLUCAN) tablet 200 mg        200 mg Oral Daily 08/12/11 1125 08/12/11 1426          Medications: I have reviewed the patient's current medications.  Objective: Vital signs in last 24 hours: Temp:  [97.2 F (36.2 C)-101.3 F (38.5 C)] 99.8 F (37.7 C) (05/14 0901) Pulse Rate:  [78-101] 100  (05/14 0857) Resp:  [10-35] 20  (05/14 0857) BP: (110-165)/(57-107) 121/75 mmHg (05/14 0857) SpO2:  [94 %-98 %] 95 % (05/14 0857) Weight:  [191 lb 12.8 oz (87 kg)] 191 lb 12.8 oz (87 kg) (05/13 2012)   General appearance: alert, cooperative and no distress Resp: clear to auscultation bilaterally Cardio: regular rate and rhythm, S1, S2 normal, no murmur, click, rub or gallop GI: soft, non-tender; bowel sounds normal; no masses,  no organomegaly Extremities: extremities normal, atraumatic, no cyanosis or edema  Lab Results  Basename 08/13/11 0540 08/11/11 0839  WBC 7.1 6.5  HGB 11.8* 12.2*  HCT 35.8* 36.7*  NA 132* 133*  K 5.2* 4.5  CL 97 96  CO2 27 23  BUN 15 13  CREATININE 1.14 1.02  GLU -- --   Liver Panel No results found for this basename: PROT:2,ALBUMIN:2,AST:2,ALT:2,ALKPHOS:2,BILITOT:2,BILIDIR:2,IBILI:2 in the last 72 hours Sedimentation Rate No results found for this basename: ESRSEDRATE in the last 72 hours C-Reactive Protein No results found for this basename: CRP:2 in the last 72 hours  Microbiology: Recent Results (from the past 240 hour(s))  CULTURE, BLOOD (ROUTINE X 2)     Status: Normal (Preliminary result)   Collection Time   08/08/11  4:45 PM      Component Value Range Status Comment   Specimen Description BLOOD LEFT ARM   Final    Special Requests BOTTLES DRAWN AEROBIC AND ANAEROBIC 10CC   Final    Culture  Setup Time [id number]   Final    Culture     Final    Value:        BLOOD CULTURE RECEIVED NO GROWTH TO DATE CULTURE WILL BE HELD FOR 5 DAYS BEFORE ISSUING A FINAL NEGATIVE REPORT   Report Status PENDING   Incomplete   CULTURE, BLOOD (ROUTINE X 2)     Status: Normal (Preliminary result)   Collection Time   08/08/11  4:45 PM      Component Value Range Status Comment   Specimen Description BLOOD LEFT ARM   Final    Special Requests BOTTLES DRAWN AEROBIC ONLY 3CC   Final    Culture  Setup Time [id number]   Final    Culture     Final    Value:        BLOOD CULTURE RECEIVED NO GROWTH TO DATE CULTURE WILL BE HELD FOR 5 DAYS BEFORE ISSUING A FINAL NEGATIVE REPORT   Report Status PENDING   Incomplete   MRSA PCR SCREENING     Status: Normal   Collection Time   08/08/11  9:28  PM      Component Value Range Status Comment   MRSA by PCR NEGATIVE  NEGATIVE  Final   AFB CULTURE, BLOOD     Status: Normal (Preliminary result)   Collection Time   08/09/11  9:00 PM      Component Value Range Status Comment   Specimen Description BLOOD ARM RIGHT   Final    Special Requests 5CC AFB   Final    Culture     Final    Value: CULTURE WILL BE EXAMINED FOR 6 WEEKS BEFORE ISSUING A FINAL REPORT   Report Status PENDING   Incomplete   AFB CULTURE, BLOOD     Status: Normal (Preliminary result)   Collection Time   08/13/11 12:45 AM      Component Value Range Status Comment   Specimen Description BLOOD RIGHT HAND   Final    Special Requests 5CC   Final    Culture     Final    Value: CULTURE WILL BE EXAMINED FOR 6 WEEKS BEFORE ISSUING A FINAL REPORT   Report Status PENDING   Incomplete     Studies/Results: Dg Esophagus Dilatation  08/12/2011  CLINICAL DATA: stricture   ESOPHAGEAL DILATATION  Fluoroscopy was provided for use by  the requesting physician.  No images  were obtained for radiographic interpretation.       Assessment/Plan: 1) FUO -  I strongly suspect the fever is due to the vegetation.  I have discussed personally with cardiology that it would be ideal to get a piece of the vegetation to send to pathology for culture (bacterial, AFB and fungal) and stains (particularly for fungal) to try to get an ID.  Patient has significant farm animal exposures so the differential of organisms is broad.  I doubt it is bacterial, though certainly brucella, Q fever is possible (antibodies pending).  Histo is possible so will send off urine histo antigen and blasto ag in urine.  Once fungal blood cultures are drawn (not done properly yesterday), I will start empiric micafungin (which will also cover candida).  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  HIV is negative.  It can occur for recurrent instrumentation.    Lynora Dymond Infectious Diseases 08/13/2011, 9:30 AM
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
## 2011-08-14 ENCOUNTER — Encounter: Payer: Medicare Other | Admitting: Internal Medicine

## 2011-08-14 DIAGNOSIS — R894 Abnormal immunological findings in specimens from other organs, systems and tissues: Secondary | ICD-10-CM

## 2011-08-14 LAB — UIFE/LIGHT CHAINS/TP QN, 24-HR UR
Free Kappa Lt Chains,Ur: 28.2 mg/dL — ABNORMAL HIGH (ref 0.14–2.42)
Free Kappa/Lambda Ratio: 20.58 ratio — ABNORMAL HIGH (ref 2.04–10.37)
Free Lambda Lt Chains,Ur: 1.37 mg/dL — ABNORMAL HIGH (ref 0.02–0.67)
Total Protein, Urine: 30.2 mg/dL

## 2011-08-14 LAB — BASIC METABOLIC PANEL
BUN: 13 mg/dL (ref 6–23)
Calcium: 9.4 mg/dL (ref 8.4–10.5)
Creatinine, Ser: 1.07 mg/dL (ref 0.50–1.35)
GFR calc Af Amer: 82 mL/min — ABNORMAL LOW (ref >90)
GFR calc non Af Amer: 71 mL/min — ABNORMAL LOW (ref >90)

## 2011-08-14 LAB — CBC
MCHC: 32.5 g/dL (ref 30.0–36.0)
Platelets: 147 10*3/uL — ABNORMAL LOW (ref 150–400)
RDW: 15.6 % — ABNORMAL HIGH (ref 11.5–15.5)
WBC: 6.4 10*3/uL (ref 4.0–10.5)

## 2011-08-14 LAB — BRUCELLA IGG, IGM

## 2011-08-14 LAB — ABO/RH: ABO/RH(D): A NEG

## 2011-08-14 MED ORDER — CEFAZOLIN SODIUM-DEXTROSE 2-3 GM-% IV SOLR
2.0000 g | INTRAVENOUS | Status: AC
  Administered 2011-08-15: 2 g via INTRAVENOUS
  Filled 2011-08-14 (×2): qty 50

## 2011-08-14 MED ORDER — SODIUM CHLORIDE 0.9 % IJ SOLN
3.0000 mL | Freq: Two times a day (BID) | INTRAMUSCULAR | Status: DC
  Administered 2011-08-15: 3 mL via INTRAVENOUS

## 2011-08-14 MED ORDER — SODIUM CHLORIDE 0.9 % IV SOLN
250.0000 mL | INTRAVENOUS | Status: DC
  Administered 2011-08-15: 1000 mL via INTRAVENOUS

## 2011-08-14 MED ORDER — SODIUM CHLORIDE 0.9 % IR SOLN
80.0000 mg | Status: DC
  Filled 2011-08-14: qty 2

## 2011-08-14 MED ORDER — CHLORHEXIDINE GLUCONATE 4 % EX LIQD
60.0000 mL | Freq: Once | CUTANEOUS | Status: AC
  Administered 2011-08-15: 4 via TOPICAL
  Filled 2011-08-14: qty 60

## 2011-08-14 MED ORDER — SODIUM CHLORIDE 0.9 % IJ SOLN: 3.0000 mL | INTRAMUSCULAR | Status: DC | PRN

## 2011-08-14 MED ORDER — SODIUM CHLORIDE 0.45 % IV SOLN
INTRAVENOUS | Status: DC
  Administered 2011-08-15: 1000 mL via INTRAVENOUS

## 2011-08-14 MED ORDER — CHLORHEXIDINE GLUCONATE 4 % EX LIQD
60.0000 mL | Freq: Once | CUTANEOUS | Status: AC
  Administered 2011-08-14: 4 via TOPICAL
  Filled 2011-08-14 (×2): qty 60

## 2011-08-14 MED ORDER — DIPHENHYDRAMINE HCL 25 MG PO CAPS
25.0000 mg | ORAL_CAPSULE | Freq: Once | ORAL | Status: AC
  Administered 2011-08-14: 25 mg via ORAL
  Filled 2011-08-14: qty 1

## 2011-08-14 NOTE — Progress Notes (Signed)
[facility] Progress Note  Subjective: Patient swallowing is improved  Objective: Vital signs in last 24 hours: Temp:  [97.4 F (36.3 C)-101.9 F (38.8 C)] 98.8 F (37.1 C) (05/15 0920) Pulse Rate:  [71-104] 104  (05/15 0920) Resp:  [20] 20  (05/15 0920) BP: (100-141)/(56-72) 104/72 mmHg (05/15 0920) SpO2:  [95 %-97 %] 96 % (05/15 0920) Weight:  [85.7 kg (188 lb 15 oz)] 85.7 kg (188 lb 15 oz) (05/14 2059) Weight change: -1.3 kg (-2 lb 13.9 oz)   PE: Unchanged  Lab Results: Results for orders placed during the hospital encounter of 08/08/11 (from the past 24 hour(s))  CBC     Status: Abnormal   Collection Time   08/14/11  8:23 AM      Component Value Range   WBC 6.4  4.0 - 10.5 (K/uL)   RBC 4.51  4.22 - 5.81 (MIL/uL)   Hemoglobin 12.2 (*) 13.0 - 17.0 (g/dL)   HCT 98.1 (*) 19.1 - 52.0 (%)   MCV 83.1  78.0 - 100.0 (fL)   MCH 27.1  26.0 - 34.0 (pg)   MCHC 32.5  30.0 - 36.0 (g/dL)   RDW 47.8 (*) 29.5 - 15.5 (%)   Platelets 147 (*) 150 - 400 (K/uL)    Studies/Results: Dg Esophagus Dilatation  08/12/2011  CLINICAL DATA: stricture   ESOPHAGEAL DILATATION  Fluoroscopy was provided for use by the requesting physician.  No images  were obtained for radiographic interpretation.        Assessment: Dysphagia from a combination of vague esophageal stricture and significant candidal overgrowth  Plan: Continue Diflucan and patient is now on IV antifungal is for suspicion of fungal infection of pacemaker as well. We'll followup in 2 days.    Chalee Hirota C 08/14/2011, 9:28 AM

## 2011-08-14 NOTE — Progress Notes (Signed)
Nutrition Follow-up  Gastroenterology saw pt on 5/11 recommending combined endoscopy with possible dilatation. Endoscopy on 5/13 work-up revealed severe candidal esophagitis with no obvious stricture. Empiric dilatation was performed based on previous positive response to dilatation.  Pt advanced from full liquid to Heart Healthy diet on 5/14. Intake variable ranging from 0 - 100% of meals. RN reports pt is taking Ensure Complete as scheduled, but will not take Ensure Pudding.  Diet Order:  Heart Healthy Supplement: Ensure Pudding TID, Ensure Complete BID  Meds: Scheduled Meds:   . aspirin EC  81 mg Oral Daily  . calcium carbonate  1 tablet Oral TID  . feeding supplement  237 mL Oral BID BM  . feeding supplement  1 Container Oral TID BM  . metoprolol  25 mg Oral BID  . micafungin [facility]) IV  150 mg Intravenous Daily  . mulitivitamin with minerals  1 tablet Oral Daily  . pantoprazole  40 mg Oral Q1200  . polyethylene glycol  17 g Oral Daily  . senna-docusate  1 tablet Oral BID   Continuous Infusions:  PRN Meds:.acetaminophen, diphenhydrAMINE, docusate sodium, ondansetron (ZOFRAN) IV, ondansetron, traMADol  Labs:  CMP     Component Value Date/Time   NA 132* 08/14/2011 0823   K 4.4 08/14/2011 0823   CL 96 08/14/2011 0823   CO2 24 08/14/2011 0823   GLUCOSE 135* 08/14/2011 0823   BUN 13 08/14/2011 0823   CREATININE 1.07 08/14/2011 0823   CREATININE 1.10 07/29/2011 1711   CALCIUM 9.4 08/14/2011 0823   PROT 7.9 08/08/2011 1645   ALBUMIN 2.9* 08/08/2011 1645   AST 32 08/08/2011 1645   ALT 23 08/08/2011 1645   ALKPHOS 72 08/08/2011 1645   BILITOT 0.3 08/08/2011 1645   GFRNONAA 71* 08/14/2011 0823   GFRAA 82* 08/14/2011 0823   CMP     Component Value Date/Time   NA 132* 08/14/2011 0823   K 4.4 08/14/2011 0823   CL 96 08/14/2011 0823   CO2 24 08/14/2011 0823   GLUCOSE 135* 08/14/2011 0823   BUN 13 08/14/2011 0823   CREATININE 1.07 08/14/2011 0823   CREATININE 1.10 07/29/2011 1711   CALCIUM 9.4  08/14/2011 0823   PROT 7.9 08/08/2011 1645   ALBUMIN 2.9* 08/08/2011 1645   AST 32 08/08/2011 1645   ALT 23 08/08/2011 1645   ALKPHOS 72 08/08/2011 1645   BILITOT 0.3 08/08/2011 1645   GFRNONAA 71* 08/14/2011 0823   GFRAA 82* 08/14/2011 0823   CBG (last 3)  No results found for this basename: GLUCAP:3 in the last 72 hours   Intake/Output Summary (Last 24 hours) at 08/14/11 1127 Last data filed at 08/13/11 1811  Gross per 24 hour  Intake    460 ml  Output    875 ml  Net   -415 ml   Weight Status:  85.7 kg, wt up 1.6 kg x 5 days  Estimated needs:  1700 - 1900 kcal, 75 - 90 grams protein  Nutrition Dx:  Swallowing difficulty r/t esophageal stricture AEB chronic dysphagia, wt loss, and difficulty meeting estimated needs with diminished ability to consume foods.  Goal:  Pt to consume at least 75% of estimated needs with meals and supplements.  Intervention:  Discontinue Ensure Pudding. Continue Ensure Complete. Encourage meals as able.  Monitor:  PO intake, weights, labs, I/O's  Adair Laundry Pager #:  [phone]

## 2011-08-14 NOTE — Progress Notes (Signed)
IM Attending  Seems to be feeling better this AM, more engaging.  We are trying to clarify an apparant gammopathy and he is facing extraction of pacer with mass on a lead -- presumably related to extensive esophageal candidiasis..  Will consult Heme today while awaiting results of IFE.
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
## 2011-08-14 NOTE — Progress Notes (Signed)
08/14/2011 [name] from  [place] lab called at 0830 blood culture that was drawn on 08/13/2011 anerobic bottle was gram positive cocci and cluster. Dr Candy Sledge is aware. Biochemist, clinical.

## 2011-08-14 NOTE — Progress Notes (Signed)
Patient ID: [name], male   DOB: Aug 25, 1945, 66 y.o.   MRN: [id number] Subjective:  Subjective fevers. No chest pain or sob.  Objective:  Vital Signs in the last 24 hours: Temp:  [97.4 F (36.3 C)-101.9 F (38.8 C)] 99.9 F (37.7 C) (05/15 0458) Pulse Rate:  [71-102] 71  (05/15 0458) Resp:  [20] 20  (05/15 0458) BP: (100-141)/(56-75) 113/56 mmHg (05/15 0458) SpO2:  [95 %-97 %] 95 % (05/15 0458) Weight:  [188 lb 15 oz (85.7 kg)] 188 lb 15 oz (85.7 kg) (05/14 2059)  Intake/Output from previous day: 05/14 0701 - 05/15 0700 In: 700 [P.O.:600; IV Piggyback:100] Out: 875 [Urine:875] Intake/Output from this shift:    Physical Exam: Well appearing NAD HEENT: Unremarkable Neck:  No JVD, no thyromegally Lymphatics:  No adenopathy Back:  No CVA tenderness Lungs:  Clear with no wheezes. HEART:  Regular rate rhythm, no murmurs, no rubs, no clicks Abd:  Flat, positive bowel sounds, no organomegally, no rebound, no guarding Ext:  2 plus pulses, no edema, no cyanosis, no clubbing Skin:  No rashes no nodules Neuro:  CN II through XII intact, motor grossly intact  Lab Results:  Basename 08/13/11 0540 08/11/11 0839  WBC 7.1 6.5  HGB 11.8* 12.2*  PLT 141* 125*    Basename 08/13/11 0540 08/11/11 0839  NA 132* 133*  K 5.2* 4.5  CL 97 96  CO2 27 23  GLUCOSE 107* 135*  BUN 15 13  CREATININE 1.14 1.02   No results found for this basename: TROPONINI:2,CK,MB:2 in the last 72 hours Hepatic Function Panel No results found for this basename: PROT,ALBUMIN,AST,ALT,ALKPHOS,BILITOT,BILIDIR,IBILI in the last 72 hours No results found for this basename: CHOL in the last 72 hours No results found for this basename: PROTIME in the last 72 hours  Imaging: Dg Esophagus Dilatation  08/12/2011  CLINICAL DATA: stricture   ESOPHAGEAL DILATATION  Fluoroscopy was provided for use by the requesting physician.  No images  were obtained for radiographic interpretation.      Cardiac  Studies: Tele - NSR Assessment/Plan:  1. 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 I have discussed the risks/benefits/goals/expectations of ICD system extraction and he wishes to proceed. It should be noted that we will send lead fragments for culture but the vegetation will likely come off and embolize to lungs. This is the typical experience from these procedures. Time and antibiotic therapy should clear infection in total. Device reimplantation would depend on our ability to clear fungus from his esophagus (assuming this is the source) and the presence of negative serial cultures.  LOS: 6 days    Lewayne Bunting M.D. 08/14/2011, 7:33 AM

## 2011-08-14 NOTE — Progress Notes (Signed)
Pharmacist Heart Failure Core Measure Documentation  Assessment: [name] has an EF documented as 25-30% on 08/12/2011 by TEE.  Rationale: Heart failure patients with left ventricular systolic dysfunction (LVSD) and an EF < 40% should be prescribed an angiotensin converting enzyme inhibitor (ACEI) or angiotensin receptor blocker (ARB) at discharge unless a contraindication is documented in the medical record.  This patient is not currently on an ACEI or ARB for HF.  This note is being placed in the record in order to provide documentation that a contraindication to the use of these agents is present for this encounter.  ACE Inhibitor or Angiotensin Receptor Blocker is contraindicated (specify all that apply)  [x]   ACEI allergy AND ARB allergy []   Angioedema []   Moderate or severe aortic stenosis []   Hyperkalemia [x]   Hypotension []   Renal artery stenosis []   Worsening renal function, preexisting renal disease or dysfunction   Riki Rusk 08/14/2011 2:24 PM

## 2011-08-14 NOTE — Progress Notes (Signed)
Medical Student Daily Progress Note  Subjective: Patient reports feeling about the same as yesterday. He had a BM yesterday evening and reports night sweats overnight. His highest recorded temperature was 101.9 yesterday evening. He denies any chest pain, N/V/D, SOB, HA, or swelling in his extremities.  Objective: Vital signs in last 24 hours: Filed Vitals:   08/13/11 1805 08/13/11 2059 08/14/11 0458 08/14/11 0920  BP: 141/64 100/56 113/56 104/72  Pulse: 90 102 71 104  Temp: 100.6 F (38.1 C) 101.9 F (38.8 C) 99.9 F (37.7 C) 98.8 F (37.1 C)  TempSrc: Oral Rectal Rectal Oral  Resp: 20 20 20 20   Height:      Weight:  85.7 kg (188 lb 15 oz)    SpO2: 97% 97% 95% 96%   Weight change: -1.3 kg (-2 lb 13.9 oz)  Intake/Output Summary (Last 24 hours) at 08/14/11 1301 Last data filed at 08/13/11 1811  Gross per 24 hour  Intake    360 ml  Output    875 ml  Net   -515 ml   Physical Exam: BP 104/72  Pulse 104  Temp(Src) 98.8 F (37.1 C) (Oral)  Resp 20  Ht 5\' 10"  (1.778 m)  Wt 85.7 kg (188 lb 15 oz)  BMI 27.11 kg/m2  SpO2 96% General appearance: alert, cooperative and no distress Head: Normocephalic, without obvious abnormality, atraumatic Neck: no adenopathy, no carotid bruit, no JVD, supple, symmetrical, trachea midline and thyroid not enlarged, symmetric, no tenderness/mass/nodules Back: symmetric, no curvature. ROM normal. No CVA tenderness. Lungs: clear to auscultation bilaterally Chest wall: no tenderness Heart: regular rate and rhythm, S1, S2 normal, no murmur, click, rub or gallop Abdomen: soft, non-tender; bowel sounds normal; no masses,  no organomegaly Extremities: extremities normal, atraumatic, no cyanosis or edema Skin: Skin color, texture, turgor normal. No rashes or lesions Lab Results: Basic Metabolic Panel:  Lab 08/14/11 4098 08/13/11 0540  NA 132* 132*  K 4.4 5.2*  CL 96 97  CO2 24 27  GLUCOSE 135* 107*  BUN 13 15  CREATININE 1.07 1.14  CALCIUM  9.4 9.6  MG -- --  PHOS -- --   Liver Function Tests:  Lab 08/08/11 1645  AST 32  ALT 23  ALKPHOS 72  BILITOT 0.3  PROT 7.9  ALBUMIN 2.9*   No results found for this basename: LIPASE:2,AMYLASE:2 in the last 168 hours No results found for this basename: AMMONIA:2 in the last 168 hours CBC:  Lab 08/14/11 0823 08/13/11 0540 08/08/11 1645  WBC 6.4 7.1 --  NEUTROABS -- 4.2 3.6  HGB 12.2* 11.8* --  HCT 37.5* 35.8* --  MCV 83.1 82.1 --  PLT 147* 141* --   Cardiac Enzymes:  Lab 08/09/11 0735 08/08/11 2126 08/08/11 1645  CKTOTAL 14 20 18   CKMB 1.2 1.1 1.2  CKMBINDEX -- -- --  TROPONINI <0.30 <0.30 <0.30   BNP:  Lab 08/08/11 1645  PROBNP 522.1*   D-Dimer: No results found for this basename: DDIMER:2 in the last 168 hours CBG: No results found for this basename: GLUCAP:6 in the last 168 hours Hemoglobin A1C: No results found for this basename: HGBA1C in the last 168 hours Fasting Lipid Panel:  Lab 08/09/11 0735  CHOL 138  HDL 16*  LDLCALC 78  TRIG 119*  CHOLHDL 8.6  LDLDIRECT --   Thyroid Function Tests:  Lab 08/08/11 1645  TSH 1.523  T4TOTAL --  FREET4 --  T3FREE --  THYROIDAB --   Coagulation: No results found for this  basename: LABPROT:4,INR:4 in the last 168 hours Anemia Panel:  Lab 08/09/11 1830  VITAMINB12 949*  FOLATE >20.0  FERRITIN 597*  TIBC 243  IRON 21*  RETICCTPCT 1.9   Urine Drug Screen: Drugs of Abuse  No results found for this basename: labopia, cocainscrnur, labbenz, amphetmu, thcu, labbarb    Alcohol Level: No results found for this basename: ETH:2 in the last 168 hours Urinalysis:  Lab 08/08/11 1646  COLORURINE AMBER*  LABSPEC 1.026  PHURINE 5.5  GLUCOSEU NEGATIVE  HGBUR NEGATIVE  BILIRUBINUR SMALL*  KETONESUR NEGATIVE  PROTEINUR NEGATIVE  UROBILINOGEN 1.0  NITRITE NEGATIVE  LEUKOCYTESUR NEGATIVE    Micro Results: Recent Results (from the past 240 hour(s))  CULTURE, BLOOD (ROUTINE X 2)     Status: Normal  (Preliminary result)   Collection Time   08/08/11  4:45 PM      Component Value Range Status Comment   Specimen Description BLOOD LEFT ARM   Final    Special Requests BOTTLES DRAWN AEROBIC AND ANAEROBIC 10CC   Final    Culture  Setup Time [id number]   Final    Culture     Final    Value:        BLOOD CULTURE RECEIVED NO GROWTH TO DATE CULTURE WILL BE HELD FOR 5 DAYS BEFORE ISSUING A FINAL NEGATIVE REPORT   Report Status PENDING   Incomplete   CULTURE, BLOOD (ROUTINE X 2)     Status: Normal (Preliminary result)   Collection Time   08/08/11  4:45 PM      Component Value Range Status Comment   Specimen Description BLOOD LEFT ARM   Final    Special Requests BOTTLES DRAWN AEROBIC ONLY 3CC   Final    Culture  Setup Time [id number]   Final    Culture     Final    Value:        BLOOD CULTURE RECEIVED NO GROWTH TO DATE CULTURE WILL BE HELD FOR 5 DAYS BEFORE ISSUING A FINAL NEGATIVE REPORT   Report Status PENDING   Incomplete   MRSA PCR SCREENING     Status: Normal   Collection Time   08/08/11  9:28 PM      Component Value Range Status Comment   MRSA by PCR NEGATIVE  NEGATIVE  Final   AFB CULTURE, BLOOD     Status: Normal (Preliminary result)   Collection Time   08/09/11  9:00 PM      Component Value Range Status Comment   Specimen Description BLOOD ARM RIGHT   Final    Special Requests 5CC AFB   Final    Culture     Final    Value: CULTURE WILL BE EXAMINED FOR 6 WEEKS BEFORE ISSUING A FINAL REPORT   Report Status PENDING   Incomplete   CULTURE, BLOOD (ROUTINE X 2)     Status: Normal (Preliminary result)   Collection Time   08/12/11  6:00 PM      Component Value Range Status Comment   Specimen Description BLOOD ARM RIGHT   Final    Special Requests BOTTLES DRAWN AEROBIC ONLY [facility]   Final    Culture  Setup Time [id number]   Final    Culture     Final    Value:        BLOOD CULTURE RECEIVED NO GROWTH TO DATE CULTURE WILL BE HELD FOR 5 DAYS BEFORE ISSUING A FINAL NEGATIVE REPORT   Report  Status PENDING   Incomplete  CULTURE, BLOOD (ROUTINE X 2)     Status: Normal (Preliminary result)   Collection Time   08/12/11  6:15 PM      Component Value Range Status Comment   Specimen Description BLOOD ARM LEFT   Final    Special Requests BOTTLES DRAWN AEROBIC AND ANAEROBIC 10CC   Final    Culture  Setup Time [id number]   Final    Culture     Final    Value:        BLOOD CULTURE RECEIVED NO GROWTH TO DATE CULTURE WILL BE HELD FOR 5 DAYS BEFORE ISSUING A FINAL NEGATIVE REPORT   Report Status PENDING   Incomplete   CULTURE, BLOOD (ROUTINE X 2)     Status: Normal (Preliminary result)   Collection Time   08/13/11 12:40 AM      Component Value Range Status Comment   Specimen Description BLOOD RIGHT ARM   Final    Special Requests BOTTLES DRAWN AEROBIC AND ANAEROBIC 5CC   Final    Culture  Setup Time [id number]   Final    Culture     Final    Value: GRAM POSITIVE COCCI IN CLUSTERS     Note: Gram Stain Report Called to,Read Back By and Verified With: Lovie Macadamia 08/14/2011 7:51AM YIMSU   Report Status PENDING   Incomplete   CULTURE, BLOOD (ROUTINE X 2)     Status: Normal (Preliminary result)   Collection Time   08/13/11 12:43 AM      Component Value Range Status Comment   Specimen Description BLOOD RIGHT HAND   Final    Special Requests BOTTLES DRAWN AEROBIC AND ANAEROBIC 5CC   Final    Culture  Setup Time [id number]   Final    Culture     Final    Value:        BLOOD CULTURE RECEIVED NO GROWTH TO DATE CULTURE WILL BE HELD FOR 5 DAYS BEFORE ISSUING A FINAL NEGATIVE REPORT   Report Status PENDING   Incomplete   AFB CULTURE, BLOOD     Status: Normal (Preliminary result)   Collection Time   08/13/11 12:45 AM      Component Value Range Status Comment   Specimen Description BLOOD RIGHT HAND   Final    Special Requests 5CC   Final    Culture     Final    Value: CULTURE WILL BE EXAMINED FOR 6 WEEKS BEFORE ISSUING A FINAL REPORT   Report Status PENDING   Incomplete     Studies/Results: No results found. Medications:  I have reviewed the patient's current medications. Prior to Admission:  Prescriptions prior to admission  Medication Sig Dispense Refill  . Ascorbic Acid (VITAMIN C) 100 MG tablet Take 100 mg by mouth daily.      . calcium carbonate (TUMS - DOSED IN MG ELEMENTAL CALCIUM) 500 MG chewable tablet Chew 1 tablet by mouth 3 (three) times daily.      . diphenhydramine-acetaminophen (TYLENOL PM) 25-500 MG TABS Take 1 tablet by mouth at bedtime as needed. For pain      . esomeprazole (NEXIUM) 40 MG capsule Take 40 mg by mouth 2 (two) times daily.       . famotidine (PEPCID) 10 MG tablet Take 10 mg by mouth 2 (two) times daily.      . Ibuprofen 200 MG CAPS Take 600-800 mg by mouth every 6 (six) hours as needed. Depending on fever      . metoprolol (  LOPRESSOR) 50 MG tablet Take 25 mg by mouth daily. Takes in am      . Multiple Vitamins-Minerals (MULTIVITAMIN) tablet Take 1 tablet by mouth daily with breakfast.   30 tablet    . Phenylephrine-Acetaminophen 5-325 MG TABS Take 1 tablet by mouth every 4 (four) hours as needed. For sinus headache      . traMADol (ULTRAM) 50 MG tablet Take 1 tablet (50 mg total) by mouth every 6 (six) hours as needed for pain.  20 tablet  0  . DISCONTD: acetaminophen (TYLENOL) 500 MG tablet Take 500 mg by mouth every 6 (six) hours as needed. For pain.      [name] DISCONTD: Aspirin-Salicylamide-Caffeine (BC HEADACHE POWDER PO) Take 1 Package by mouth daily as needed. For headaches.      [name] DISCONTD: Flaxseed, Linseed, 1000 MG CAPS Take 1 capsule by mouth daily.       Anti-infectives     Start     Dose/Rate Route Frequency Ordered Stop   08/13/11 1200   micafungin (MYCAMINE) 150 mg in sodium chloride 0.9 % 100 mL IVPB     Comments: Wait until fungal blood cultures are drawn before starting      150 mg 100 mL/hr over 1 Hours Intravenous Daily 08/13/11 0948     08/13/11 1000   fluconazole (DIFLUCAN) tablet 100 mg  Status:   Discontinued        100 mg Oral Daily 08/12/11 1125 08/13/11 0948   08/12/11 1200   fluconazole (DIFLUCAN) tablet 200 mg        200 mg Oral Daily 08/12/11 1125 08/12/11 1426         Scheduled Meds:   . aspirin EC  81 mg Oral Daily  . calcium carbonate  1 tablet Oral TID  . feeding supplement  237 mL Oral BID BM  . feeding supplement  1 Container Oral TID BM  . metoprolol  25 mg Oral BID  . micafungin [facility]) IV  150 mg Intravenous Daily  . mulitivitamin with minerals  1 tablet Oral Daily  . pantoprazole  40 mg Oral Q1200  . polyethylene glycol  17 g Oral Daily  . senna-docusate  1 tablet Oral BID   Continuous Infusions:  PRN Meds:.acetaminophen, diphenhydrAMINE, docusate sodium, ondansetron (ZOFRAN) IV, ondansetron, traMADol Assessment/Plan:  FUO - Tmax 101.9 last night. His gram stain of 1 of 2 blood cultures revealed GPC in clusters. His undulating fever is likely due to his candidiasis. He has a history of undergoing esophageal dilations 2/2 candidiasis overgrowth and a course of fluconazole for treatment. The question of his underlying immunosuppression allowing recurrent candida is coming to light slowly (see MM below).  Negative labs to date: uric acid, HIV, RPR, Cryptococcal Ag, CT hip, cyclic citrul Ab IgG, Quantiferon gold, Chlamydia Tests pending remain: Brucellosis, Q fever, HLA B27, leptospirosis   -Appreciate ID, GI, cardiology assistance in the care and management of this patient -continue micafungin IV daily -blood, AFB, fungal cultures pending  GERD/Esophageal stricture - He is s/p esophageal dilation on 08/12/11. He has had a history of esophageal dilation s/p strictures and candidiasis and poor po intake. -Diet has been advanced to heart healthy and is being tolerated well -continue Protonix daily  Multiple myeloma workup - Admission CMP revealed protein gap of 5 (total protein - albumin).   UPEP revealed increased kappa chains (28.2) in urine (kappa/lambda  ratio = 20.58). 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 His calcium  remains WNL (9.4) and SCr =1.07 and no s/s of aching bone pains.  -Hematology consult, appreciate their assistance in the management of this patient -IFE (immunofixation electrophoresis) pending for plasma cell line identification  Right atrial lead artifact - Discovered on TEE 08/12/11. It is believed that this is an extension of his esophageal candidiasis. ICD and mass will be extracted 08/15/11 and sent off for culture. Likelihood of embolization to lung seems imminent and he is currently being treated with micafungin. -ICD and artifact removal in am/afternoon -will add/modify medicinal therapy as needed  ICD in situ - He will undergo removal of ICD tomorrow (Thursday) by Dr. Ladona Ridgel 2/2 discovery of mass/vegetation via TEE on 08/12/11.  Ischemic cardiomyopathy - Stable.  -Appreciate cardiology recommendations.  -continue metoprolol BID  -continue ASA  Weight loss, unintentional - His TSH is WNL (1.523). This is likely 2/2 his underlying principal problem but also his esophageal stricture is a large component of this in the past.  -Appreciate GI and nutrition assistance in management of this patient  -continue heart diet  Normocytic anemia - This is acute and has developed since March when hemoglobin was 14. Anemia panel revealing: Iron = 21, Sat ratio = 9, TIBC =243, Ferritin = 597, Retic count =1.9, FOBT negative. Smear revealed mild anemia with normal morphology. Still consistent with AOCD given that TIBC is normal (elevated in IDA) and ferritin high although could still be a component of acute phase reactant. Pt had last colonoscopy in 2012 showing sigmoid and descending diverticula (but no recurrent polyps - last noted to have hyperplastic polyps in 2001).   Thrombocytopenia - Likely related to underlying principal problem. Smear also reveals thrombocytopenia.  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 Multiple causes for this include extension of Candida infection vs. Vitamin B2 deficiency (less likely) vs. Iron deficiency anemia (AOCD more likely at this point), all of which are treatable conditions in this patient. As final diagnosis nears closer, treatment options for this will narrow as well. -likely will resolve as his esophageal candida resolves completely  Constipation - He has had spells of decreased po intake during his spells of dysphagia. He resumed his diet yesterday and had a BM in the evening. -resolved -continue docusate, miralax, senokot-S  CAD h/o CABG x 4 (2003) - Stable. CE negative x 3 on admission.  -continue metoprolol  -continue ASA  -no statin 2/2 allergy  DVT ppx - SCD's  Disposition - He will undergo removal of his ICD and right atrial "mass" in the morning. IFE and cultures are pending.   LOS: 6 days   This is a Psychologist, occupational Note.  The care of the patient was discussed with Dr. Priscella Mann and the assessment and plan formulated with their assistance.  Please see their attached note for official documentation of the daily encounter.  Lewie Chamber 08/14/2011, 1:01 PM    Resident Addendum to Above: Please see completed progress note by Dr. Candy Sledge from today, 08/14/2011 for addition details.  Johnette Abraham, D.O., 08/14/2011

## 2011-08-14 NOTE — Progress Notes (Signed)
INFECTIOUS DISEASE PROGRESS NOTE  ID: [name] is a 66 y.o. male with long history of FUO and extensive work up, weight loss and now with vegetation noted on leads.    Subjective: Patient feels ok overall, tired  Abtx:  Anti-infectives     Start     Dose/Rate Route Frequency Ordered Stop   08/14/11 1500   gentamicin (GARAMYCIN) 80 mg in sodium chloride irrigation 0.9 % 500 mL irrigation        80 mg Irrigation On call 08/14/11 1412 08/15/11 1500   08/14/11 1500   ceFAZolin (ANCEF) IVPB 2 g/50 mL premix        2 g 100 mL/hr over 30 Minutes Intravenous On call 08/14/11 1412 08/15/11 1500   08/13/11 1200   micafungin (MYCAMINE) 150 mg in sodium chloride 0.9 % 100 mL IVPB     Comments: Wait until fungal blood cultures are drawn before starting      150 mg 100 mL/hr over 1 Hours Intravenous Daily 08/13/11 0948     08/13/11 1000   fluconazole (DIFLUCAN) tablet 100 mg  Status:  Discontinued        100 mg Oral Daily 08/12/11 1125 08/13/11 0948   08/12/11 1200   fluconazole (DIFLUCAN) tablet 200 mg        200 mg Oral Daily 08/12/11 1125 08/12/11 1426          Medications: I have reviewed the patient's current medications.  Objective: Vital signs in last 24 hours: Temp:  [98.4 F (36.9 C)-101.9 F (38.8 C)] 98.4 F (36.9 C) (05/15 1410) Pulse Rate:  [71-104] 89  (05/15 1410) Resp:  [20] 20  (05/15 1410) BP: (100-141)/(56-72) 108/57 mmHg (05/15 1410) SpO2:  [95 %-98 %] 98 % (05/15 1410) Weight:  [188 lb 15 oz (85.7 kg)] 188 lb 15 oz (85.7 kg) (05/14 2059)   General appearance: alert, cooperative and no distress Resp: clear to auscultation bilaterally Cardio: regular rate and rhythm, S1, S2 normal, no murmur, click, rub or gallop GI: soft, non-tender; bowel sounds normal; no masses,  no organomegaly Extremities: extremities normal, atraumatic, no cyanosis or edema  Lab Results  [facility] 08/14/11 0823 08/13/11 0540  WBC 6.4 7.1  HGB 12.2* 11.8*  HCT 37.5* 35.8*   NA 132* 132*  K 4.4 5.2*  CL 96 97  CO2 24 27  BUN 13 15  CREATININE 1.07 1.14  GLU -- --   Liver Panel No results found for this basename: PROT:2,ALBUMIN:2,AST:2,ALT:2,ALKPHOS:2,BILITOT:2,BILIDIR:2,IBILI:2 in the last 72 hours Sedimentation Rate No results found for this basename: ESRSEDRATE in the last 72 hours C-Reactive Protein No results found for this basename: CRP:2 in the last 72 hours  Microbiology: Recent Results (from the past 240 hour(s))  CULTURE, BLOOD (ROUTINE X 2)     Status: Normal (Preliminary result)   Collection Time   08/08/11  4:45 PM      Component Value Range Status Comment   Specimen Description BLOOD LEFT ARM   Final    Special Requests BOTTLES DRAWN AEROBIC AND ANAEROBIC 10CC   Final    Culture  Setup Time [id number]   Final    Culture     Final    Value:        BLOOD CULTURE RECEIVED NO GROWTH TO DATE CULTURE WILL BE HELD FOR 5 DAYS BEFORE ISSUING A FINAL NEGATIVE REPORT   Report Status PENDING   Incomplete   CULTURE, BLOOD (ROUTINE X 2)     Status: Normal (  Preliminary result)   Collection Time   08/08/11  4:45 PM      Component Value Range Status Comment   Specimen Description BLOOD LEFT ARM   Final    Special Requests BOTTLES DRAWN AEROBIC ONLY 3CC   Final    Culture  Setup Time [id number]   Final    Culture     Final    Value:        BLOOD CULTURE RECEIVED NO GROWTH TO DATE CULTURE WILL BE HELD FOR 5 DAYS BEFORE ISSUING A FINAL NEGATIVE REPORT   Report Status PENDING   Incomplete   MRSA PCR SCREENING     Status: Normal   Collection Time   08/08/11  9:28 PM      Component Value Range Status Comment   MRSA by PCR NEGATIVE  NEGATIVE  Final   AFB CULTURE, BLOOD     Status: Normal (Preliminary result)   Collection Time   08/09/11  9:00 PM      Component Value Range Status Comment   Specimen Description BLOOD ARM RIGHT   Final    Special Requests 5CC AFB   Final    Culture     Final    Value: CULTURE WILL BE EXAMINED FOR 6 WEEKS BEFORE ISSUING  A FINAL REPORT   Report Status PENDING   Incomplete   CULTURE, BLOOD (ROUTINE X 2)     Status: Normal (Preliminary result)   Collection Time   08/12/11  6:00 PM      Component Value Range Status Comment   Specimen Description BLOOD ARM RIGHT   Final    Special Requests BOTTLES DRAWN AEROBIC ONLY 6CC   Final    Culture  Setup Time [id number]   Final    Culture     Final    Value:        BLOOD CULTURE RECEIVED NO GROWTH TO DATE CULTURE WILL BE HELD FOR 5 DAYS BEFORE ISSUING A FINAL NEGATIVE REPORT   Report Status PENDING   Incomplete   CULTURE, BLOOD (ROUTINE X 2)     Status: Normal (Preliminary result)   Collection Time   08/12/11  6:15 PM      Component Value Range Status Comment   Specimen Description BLOOD ARM LEFT   Final    Special Requests BOTTLES DRAWN AEROBIC AND ANAEROBIC 10CC   Final    Culture  Setup Time [id number]   Final    Culture     Final    Value:        BLOOD CULTURE RECEIVED NO GROWTH TO DATE CULTURE WILL BE HELD FOR 5 DAYS BEFORE ISSUING A FINAL NEGATIVE REPORT   Report Status PENDING   Incomplete   CULTURE, BLOOD (ROUTINE X 2)     Status: Normal (Preliminary result)   Collection Time   08/13/11 12:40 AM      Component Value Range Status Comment   Specimen Description BLOOD RIGHT ARM   Final    Special Requests BOTTLES DRAWN AEROBIC AND ANAEROBIC 5CC   Final    Culture  Setup Time [id number]   Final    Culture     Final    Value: GRAM POSITIVE COCCI IN CLUSTERS     Note: Gram Stain Report Called to,Read Back By and Verified With: Lovie Macadamia 08/14/2011 7:51AM YIMSU   Report Status PENDING   Incomplete   CULTURE, BLOOD (ROUTINE X 2)     Status: Normal (Preliminary result)   Collection  Time   08/13/11 12:43 AM      Component Value Range Status Comment   Specimen Description BLOOD RIGHT HAND   Final    Special Requests BOTTLES DRAWN AEROBIC AND ANAEROBIC 5CC   Final    Culture  Setup Time [id number]   Final    Culture     Final    Value:         BLOOD CULTURE RECEIVED NO GROWTH TO DATE CULTURE WILL BE HELD FOR 5 DAYS BEFORE ISSUING A FINAL NEGATIVE REPORT   Report Status PENDING   Incomplete   AFB CULTURE, BLOOD     Status: Normal (Preliminary result)   Collection Time   08/13/11 12:45 AM      Component Value Range Status Comment   Specimen Description BLOOD RIGHT HAND   Final    Special Requests 5CC   Final    Culture     Final    Value: CULTURE WILL BE EXAMINED FOR 6 WEEKS BEFORE ISSUING A FINAL REPORT   Report Status PENDING   Incomplete     Studies/Results: No results found.   Assessment/Plan: 1) FUO -  I strongly suspect the fever is due to the vegetation. Possible that the vegetation is candida from the esophagitis, but would be very unusual.  Hopefully a culture of the leads (culture for bacterial, AFB and fungal) will be useful or better if there is a small piece that comes out, though not likely.  He does have 1/2 blood cultures with GPC and will await result to see if it is CoNS or not.  Other fungal studies, Q fever, Brucella, pending.  It certainly does not seem to be Staph or Strep with such a prolonged course.    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  HIV is negative.  It can occur for recurrent instrumentation.  ? As cause of vegetation.  On micafungin.  If no organism identified for #1, he may need to continue micafungin since some candida species are resistant to Diflucan.  If presumed candida, he also will need an outpatient eye exam to assure no eye involvement (endophthalmitis).   Dr. Daiva Eves to follow tomorrow   Tamma Brigandi Infectious Diseases 08/14/2011, 4:01 PM

## 2011-08-14 NOTE — Progress Notes (Addendum)
INTERNAL MEDICINE DAILY PROGRESS NOTE  [name] MRN: [id number] DOB/AGE: 07/21/1945 66 y.o.  Admit date: 08/08/2011  Subjective: AC COLAN had fever again overnight to 101.9.  States yesterday was a difficult day. Has been feeling febrile with chills and overlying malaise. Report from RN that 1 of 2 anaerobic culture drawn that before last grew positive for gram-positive cocci.  Denies headache, nausea, vomiting, chest pain, shortness of breath, diarrhea or constipation, abdominal pain, swelling or other complaint.  Objective: Vital signs in last 24 hours: Filed Vitals:   08/13/11 1410 08/13/11 1805 08/13/11 2059 08/14/11 0458  BP: 137/61 141/64 100/56 113/56  Pulse: 96 90 102 71  Temp: 97.4 F (36.3 C) 100.6 F (38.1 C) 101.9 F (38.8 C) 99.9 F (37.7 C)  TempSrc: Oral Oral Rectal Rectal  Resp: 20 20 20 20   Height:      Weight:   188 lb 15 oz (85.7 kg)   SpO2: 96% 97% 97% 95%   Weight change: -2 lb 13.9 oz (-1.3 kg)  Intake/Output Summary (Last 24 hours) at 08/14/11 0807 Last data filed at 08/13/11 1811  Gross per 24 hour  Intake    700 ml  Output    875 ml  Net   -175 ml   Physical Exam: General: resting in bed, not diaphoretic HEENT: PERRL, EOMI, no scleral icterus Cardiac: RRR, no rubs, murmurs or gallops Pulm: clear to auscultation bilaterally, moving normal volumes of air Abd: soft, nontender, nondistended, very active BS Ext: warm and well perfused, no pedal edema Neuro: alert and oriented X3, cranial nerves II-XII grossly intact Lab Results: Basic Metabolic Panel:  Lab 08/13/11 4098 08/11/11 0839  NA 132* 133*  K 5.2* 4.5  CL 97 96  CO2 27 23  GLUCOSE 107* 135*  BUN 15 13  CREATININE 1.14 1.02  CALCIUM 9.6 9.6  MG -- --  PHOS -- --   Liver Function Tests:  Lab 08/08/11 1645  AST 32  ALT 23  ALKPHOS 72  BILITOT 0.3  PROT 7.9  ALBUMIN 2.9*   CBC:  Lab 08/13/11 0540 08/11/11 0839 08/08/11 1645  WBC 7.1 6.5 --  NEUTROABS  4.2 -- 3.6  HGB 11.8* 12.2* --  HCT 35.8* 36.7* --  MCV 82.1 82.1 --  PLT 141* 125* --   Cardiac Enzymes:  Lab 08/09/11 0735 08/08/11 2126 08/08/11 1645  CKTOTAL 14 20 18   CKMB 1.2 1.1 1.2  CKMBINDEX -- -- --  TROPONINI <0.30 <0.30 <0.30   BNP:  Lab 08/08/11 1645  PROBNP 522.1*   Fasting Lipid Panel:  Lab 08/09/11 0735  CHOL 138  HDL 16*  LDLCALC 78  TRIG 119*  CHOLHDL 8.6  LDLDIRECT --   Thyroid Function Tests:  Lab 08/08/11 1645  TSH 1.523  T4TOTAL --  FREET4 --  T3FREE --  THYROIDAB --   Anemia Panel:  Lab 08/09/11 1830  VITAMINB12 949*  FOLATE >20.0  FERRITIN 597*  TIBC 243  IRON 21*  RETICCTPCT 1.9   Urinalysis:  Lab 08/08/11 1646  COLORURINE AMBER*  LABSPEC 1.026  PHURINE 5.5  GLUCOSEU NEGATIVE  HGBUR NEGATIVE  BILIRUBINUR SMALL*  KETONESUR NEGATIVE  PROTEINUR NEGATIVE  UROBILINOGEN 1.0  NITRITE NEGATIVE  LEUKOCYTESUR NEGATIVE   Misc. Labs: Uric acid 5.5 RPR nonreactive Cryptococcal antigen negative HIV nonreactive Fecal occult blood test negative Chlamydia tests negative AntiCCP antibodies negative   Micro Results: Recent Results (from the past 240 hour(s))  CULTURE, BLOOD (ROUTINE X 2)  Status: Normal (Preliminary result)   Collection Time   08/08/11  4:45 PM      Component Value Range Status Comment   Specimen Description BLOOD LEFT ARM   Final    Special Requests BOTTLES DRAWN AEROBIC AND ANAEROBIC 10CC   Final    Culture  Setup Time [id number]   Final    Culture     Final    Value:        BLOOD CULTURE RECEIVED NO GROWTH TO DATE CULTURE WILL BE HELD FOR 5 DAYS BEFORE ISSUING A FINAL NEGATIVE REPORT   Report Status PENDING   Incomplete   CULTURE, BLOOD (ROUTINE X 2)     Status: Normal (Preliminary result)   Collection Time   08/08/11  4:45 PM      Component Value Range Status Comment   Specimen Description BLOOD LEFT ARM   Final    Special Requests BOTTLES DRAWN AEROBIC ONLY 3CC   Final    Culture  Setup Time  [id number]   Final    Culture     Final    Value:        BLOOD CULTURE RECEIVED NO GROWTH TO DATE CULTURE WILL BE HELD FOR 5 DAYS BEFORE ISSUING A FINAL NEGATIVE REPORT   Report Status PENDING   Incomplete   MRSA PCR SCREENING     Status: Normal   Collection Time   08/08/11  9:28 PM      Component Value Range Status Comment   MRSA by PCR NEGATIVE  NEGATIVE  Final   AFB CULTURE, BLOOD     Status: Normal (Preliminary result)   Collection Time   08/09/11  9:00 PM      Component Value Range Status Comment   Specimen Description BLOOD ARM RIGHT   Final    Special Requests 5CC AFB   Final    Culture     Final    Value: CULTURE WILL BE EXAMINED FOR 6 WEEKS BEFORE ISSUING A FINAL REPORT   Report Status PENDING   Incomplete   CULTURE, BLOOD (ROUTINE X 2)     Status: Normal (Preliminary result)   Collection Time   08/12/11  6:00 PM      Component Value Range Status Comment   Specimen Description BLOOD ARM RIGHT   Final    Special Requests BOTTLES DRAWN AEROBIC ONLY 6CC   Final    Culture  Setup Time [id number]   Final    Culture     Final    Value:        BLOOD CULTURE RECEIVED NO GROWTH TO DATE CULTURE WILL BE HELD FOR 5 DAYS BEFORE ISSUING A FINAL NEGATIVE REPORT   Report Status PENDING   Incomplete   CULTURE, BLOOD (ROUTINE X 2)     Status: Normal (Preliminary result)   Collection Time   08/12/11  6:15 PM      Component Value Range Status Comment   Specimen Description BLOOD ARM LEFT   Final    Special Requests BOTTLES DRAWN AEROBIC AND ANAEROBIC 10CC   Final    Culture  Setup Time [id number]   Final    Culture     Final    Value:        BLOOD CULTURE RECEIVED NO GROWTH TO DATE CULTURE WILL BE HELD FOR 5 DAYS BEFORE ISSUING A FINAL NEGATIVE REPORT   Report Status PENDING   Incomplete   CULTURE, BLOOD (ROUTINE X 2)     Status: Normal (  Preliminary result)   Collection Time   08/13/11 12:40 AM      Component Value Range Status Comment   Specimen Description BLOOD RIGHT ARM   Final     Special Requests BOTTLES DRAWN AEROBIC AND ANAEROBIC 5CC   Final    Culture  Setup Time [id number]   Final    Culture     Final    Value: GRAM POSITIVE COCCI IN CLUSTERS     Note: Gram Stain Report Called to,Read Back By and Verified With: Lovie Macadamia 08/14/2011 7:51AM YIMSU   Report Status PENDING   Incomplete   CULTURE, BLOOD (ROUTINE X 2)     Status: Normal (Preliminary result)   Collection Time   08/13/11 12:43 AM      Component Value Range Status Comment   Specimen Description BLOOD RIGHT HAND   Final    Special Requests BOTTLES DRAWN AEROBIC AND ANAEROBIC 5CC   Final    Culture  Setup Time [id number]   Final    Culture     Final    Value:        BLOOD CULTURE RECEIVED NO GROWTH TO DATE CULTURE WILL BE HELD FOR 5 DAYS BEFORE ISSUING A FINAL NEGATIVE REPORT   Report Status PENDING   Incomplete   AFB CULTURE, BLOOD     Status: Normal (Preliminary result)   Collection Time   08/13/11 12:45 AM      Component Value Range Status Comment   Specimen Description BLOOD RIGHT HAND   Final    Special Requests 5CC   Final    Culture     Final    Value: CULTURE WILL BE EXAMINED FOR 6 WEEKS BEFORE ISSUING A FINAL REPORT   Report Status PENDING   Incomplete    Studies/Results: Dg Esophagus Dilatation  08/12/2011  CLINICAL DATA: stricture   ESOPHAGEAL DILATATION  Fluoroscopy was provided for use by the requesting physician.  No images  were obtained for radiographic interpretation.     Endoscopy: Showed severe esophageal candidiasis no obvious stricture  Transesophageal echocardiogram: Showed large mass on the patient's right atrial lead  Medications: I have reviewed the patient's current medications. Scheduled Meds:    . aspirin EC  81 mg Oral Daily  . calcium carbonate  1 tablet Oral TID  . feeding supplement  237 mL Oral BID BM  . feeding supplement  1 Container Oral TID BM  . metoprolol  25 mg Oral BID  . micafungin [facility]) IV  150 mg Intravenous Daily  .  mulitivitamin with minerals  1 tablet Oral Daily  . pantoprazole  40 mg Oral Q1200  . polyethylene glycol  17 g Oral Daily  . senna-docusate  1 tablet Oral BID  . DISCONTD: fluconazole  100 mg Oral Daily   Continuous Infusions:  PRN Meds:.acetaminophen, diphenhydrAMINE, docusate sodium, ondansetron (ZOFRAN) IV, ondansetron, traMADol Assessment/Plan:  Pt is a 66 y.o. 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 Patient has severe esophageal candidiasis per EGD done 08/12/2011 also probable mass on right atrial lead shown on TEE 08/12/2011. Obvious concern is whether or not esophageal candidiasis has led to candidemia and subsequent seeding of his hardware.  1) fever of unknown origin - origin likely secondary to chronic esophageal candidiasis with occasional dilations. This is likely lead to transient fungemia with seeding of his pacemaker wires. Patient will require  surgical removal of pacemaker wires and culture of vegetation. Plan is to send veg to path for stains/culture. Patient is currently on micafungin therapy which will cover candidiasis. Will touch base with ID regarding positive culture for gram-positive rods. Most likely contaminant. -- Continue micafungin  -- surgery planned for thursday  -- will need ophthalmologic evaluation given that he has possible transient candidemia and has had worsening vision.  -- Greatly appreciate the assistance of all consulting teams regarding this very complex and intriguing case   2) GERD/Esophageal stricture - is post procedure day 2 from EGD and dilation on 08/12/11. Advancing diet as tolerated.  -continue Protonix daily   3) possible multiple myeloma - elevated protein gap of 5 at admission. M protein spike seen on SPEP. Ordered immunofixation electrophoresis to confirm diagnosis. We'll obtain hematology  consult today We would like them to evaluate the patient as soon as possible in advance of surgery planned for Thursday. This could be the acute cause of his recent decline or at least a factor in his immunodeficiency that predisposed him to esophageal candidiasis.  -- We'll obtain hematology consult today -- Followup immunofixation electrophoresis   4) ICD in situ - patient will require removal of his ICD for vegetation found on right atrial pacemaker lead. Will defer to cardiology for further management of ICD.   5) Ischemic cardiomyopathy - Stable.  -Appreciate cardiology recommendations.  -continue metoprolol BID  -continue ASA   6) Weight loss, unintentional - likely secondary to fever of unknown origin complicated by dysphagia from esophageal stricture and esophageal candidiasis. Diet is now heart healthy We'll continue supplementation.   7) Normocytic anemia - anemia is acute and has developed since March when hemoglobin was 14. Anemia panel showing Fe 21, TIBC 241, ferritin 597. Although most consistent with an anemia of chronic disease, transferrin saturation is low, as well, ferritin may be acutely elevated 2/2 to the fevers. Therefore, difficulty to ascertain definitively for now. Pt has last colonoscopy in 2012 showing sigmoid and descending diverticula (but no recurrent polyps - last noted to have hyperplastic polyps in 2001). Reticulocyte index based on reticulocyte % of 1.9 and HCT 36.1 is 1.01. This may indicate hypoproliferation, may be due to his iron deficiency or his anemia of chronic disease, but could also be malignancy related.  FOBT negative. Peripheral smear showed only anemia with thrombocytopenia and no atypical morphologies. -- oncology consult planned for today  8) Thrombocytopenia - Smear also confirms thrombocytopenia. Could be secondary to either chronic infection or multiple myeloma. Will pursue valuation and treatment of aforementioned problems.  -heparin products  on hold   9) Constipation - resolved with bowel movement today.  -continue docusate, miralax, and senokot-S   10) CAD h/o CABG x 4 (2003) - Stable. CE negative x 3 on admission. Unable to take statins or ACE inhibitors.  -continue metoprolol  -continue ASA   11) DVT ppx - SCD's   12) Disposition - pending surgical removal of  Vegetation (hopeful) & pacemaker and workup of immune suppression.    LOS: 6 days   Lamaj Metoyer 08/14/2011, 8:07 AM

## 2011-08-15 ENCOUNTER — Inpatient Hospital Stay (HOSPITAL_COMMUNITY): Payer: Medicare Other

## 2011-08-15 ENCOUNTER — Encounter (HOSPITAL_COMMUNITY)
Admission: AD | Disposition: A | Discharge status: Home Health | Payer: Self-pay | Source: Ambulatory Visit | Attending: Internal Medicine

## 2011-08-15 ENCOUNTER — Inpatient Hospital Stay (HOSPITAL_COMMUNITY): Payer: Medicare Other | Admitting: Certified Registered"

## 2011-08-15 ENCOUNTER — Encounter (HOSPITAL_COMMUNITY): Payer: Self-pay | Admitting: Certified Registered"

## 2011-08-15 DIAGNOSIS — R509 Fever, unspecified: Secondary | ICD-10-CM

## 2011-08-15 DIAGNOSIS — I059 Rheumatic mitral valve disease, unspecified: Secondary | ICD-10-CM

## 2011-08-15 DIAGNOSIS — A419 Sepsis, unspecified organism: Secondary | ICD-10-CM | POA: Diagnosis present

## 2011-08-15 DIAGNOSIS — I76 Septic arterial embolism: Secondary | ICD-10-CM | POA: Diagnosis present

## 2011-08-15 DIAGNOSIS — T827XXA Infection and inflammatory reaction due to other cardiac and vascular devices, implants and grafts, initial encounter: Secondary | ICD-10-CM

## 2011-08-15 DIAGNOSIS — R6889 Other general symptoms and signs: Secondary | ICD-10-CM

## 2011-08-15 DIAGNOSIS — J9601 Acute respiratory failure with hypoxia: Secondary | ICD-10-CM | POA: Diagnosis present

## 2011-08-15 DIAGNOSIS — J9600 Acute respiratory failure, unspecified whether with hypoxia or hypercapnia: Secondary | ICD-10-CM

## 2011-08-15 DIAGNOSIS — D696 Thrombocytopenia, unspecified: Secondary | ICD-10-CM

## 2011-08-15 DIAGNOSIS — I2690 Septic pulmonary embolism without acute cor pulmonale: Secondary | ICD-10-CM

## 2011-08-15 HISTORY — PX: PACEMAKER LEAD REMOVAL: SHX5064

## 2011-08-15 LAB — BASIC METABOLIC PANEL
BUN: 17 mg/dL (ref 6–23)
Creatinine, Ser: 1.22 mg/dL (ref 0.50–1.35)
GFR calc Af Amer: 70 mL/min — ABNORMAL LOW (ref >90)
GFR calc non Af Amer: 61 mL/min — ABNORMAL LOW (ref >90)
Glucose, Bld: 195 mg/dL — ABNORMAL HIGH (ref 70–99)
Potassium: 5.1 mEq/L (ref 3.5–5.1)

## 2011-08-15 LAB — FUNGAL ANTIBODIES PANEL, ID-BLOOD
Aspergillus Flavus Antibodies: NEGATIVE
Aspergillus Niger Antibodies: NEGATIVE
Aspergillus fumigatus: NEGATIVE
Blastomyces Abs, Qn, DID: NEGATIVE
Coccidioides Antibody ID: NEGATIVE

## 2011-08-15 LAB — POCT I-STAT 3, ART BLOOD GAS (G3+)
Bicarbonate: 27.3 mEq/L — ABNORMAL HIGH (ref 20.0–24.0)
O2 Saturation: 82 %
Patient temperature: 103.3
TCO2: 28 mmol/L (ref 0–100)
pCO2 arterial: 33.4 mmHg — ABNORMAL LOW (ref 35.0–45.0)
pH, Arterial: 7.365 (ref 7.350–7.450)
pH, Arterial: 7.409 (ref 7.350–7.450)
pO2, Arterial: 50 mmHg — ABNORMAL LOW (ref 80.0–100.0)
pO2, Arterial: 385 mmHg — ABNORMAL HIGH (ref 80.0–100.0)

## 2011-08-15 LAB — CULTURE, BLOOD (ROUTINE X 2)
Culture  Setup Time: 201305100009
Culture: NO GROWTH

## 2011-08-15 LAB — BLOOD GAS, ARTERIAL
Acid-base deficit: 2.5 mmol/L — ABNORMAL HIGH (ref 0.0–2.0)
Drawn by: 33234
O2 Content: 3 L/min
O2 Saturation: 82.2 %
TCO2: 23.1 mmol/L (ref 0–100)
pO2, Arterial: 61.9 mmHg — ABNORMAL LOW (ref 80.0–100.0)

## 2011-08-15 LAB — PROCALCITONIN: Procalcitonin: 2.09 ng/mL

## 2011-08-15 LAB — CBC
HCT: 35.5 % — ABNORMAL LOW (ref 39.0–52.0)
Platelets: 125 10*3/uL — ABNORMAL LOW (ref 150–400)
RDW: 15.9 % — ABNORMAL HIGH (ref 11.5–15.5)
WBC: 7.4 10*3/uL (ref 4.0–10.5)

## 2011-08-15 LAB — PHOSPHORUS: Phosphorus: 5.5 mg/dL — ABNORMAL HIGH (ref 2.3–4.6)

## 2011-08-15 SURGERY — REMOVAL, ELECTRODE LEAD, CARDIAC PACEMAKER, WITHOUT REPLACEMENT
Anesthesia: General | Site: Chest | Wound class: Dirty or Infected

## 2011-08-15 MED ORDER — SODIUM CHLORIDE 0.9 % IV SOLN
0.4000 ug/kg/h | INTRAVENOUS | Status: DC
  Administered 2011-08-15: 0.4 ug/kg/h via INTRAVENOUS
  Filled 2011-08-15 (×2): qty 2

## 2011-08-15 MED ORDER — SODIUM CHLORIDE 0.9 % IR SOLN
Freq: Once | Status: DC
  Filled 2011-08-15 (×2): qty 2

## 2011-08-15 MED ORDER — MEPERIDINE HCL 25 MG/ML IJ SOLN
25.0000 mg | Freq: Once | INTRAMUSCULAR | Status: DC
  Filled 2011-08-15: qty 1

## 2011-08-15 MED ORDER — PIPERACILLIN-TAZOBACTAM 3.375 G IVPB
3.3750 g | Freq: Three times a day (TID) | INTRAVENOUS | Status: DC
  Administered 2011-08-15 – 2011-08-21 (×16): 3.375 g via INTRAVENOUS
  Filled 2011-08-15 (×22): qty 50

## 2011-08-15 MED ORDER — FENTANYL CITRATE 0.05 MG/ML IJ SOLN: 25.0000 ug | INTRAMUSCULAR | Status: DC | PRN

## 2011-08-15 MED ORDER — ACETAMINOPHEN 325 MG PO TABS: 325.0000 mg | ORAL_TABLET | ORAL | Status: DC | PRN

## 2011-08-15 MED ORDER — VECURONIUM BROMIDE 10 MG IV SOLR
INTRAVENOUS | Status: AC
  Filled 2011-08-15: qty 10

## 2011-08-15 MED ORDER — SODIUM CHLORIDE 0.9 % IR SOLN: 1.0000 "application " | Freq: Once | Status: DC

## 2011-08-15 MED ORDER — LIDOCAINE HCL (PF) 1 % IJ SOLN
INTRAMUSCULAR | Status: DC | PRN
  Administered 2011-08-15: 10 mL via INTRADERMAL

## 2011-08-15 MED ORDER — ETOMIDATE 2 MG/ML IV SOLN
INTRAVENOUS | Status: AC
  Administered 2011-08-15: 20 mg
  Filled 2011-08-15: qty 10

## 2011-08-15 MED ORDER — FENTANYL CITRATE 0.05 MG/ML IJ SOLN
INTRAMUSCULAR | Status: DC | PRN
  Administered 2011-08-15: 50 ug via INTRAVENOUS
  Administered 2011-08-15: 150 ug via INTRAVENOUS

## 2011-08-15 MED ORDER — FENTANYL CITRATE 0.05 MG/ML IJ SOLN
100.0000 ug | Freq: Once | INTRAMUSCULAR | Status: AC
  Administered 2011-08-15: 100 ug via INTRAVENOUS

## 2011-08-15 MED ORDER — ROCURONIUM BROMIDE 100 MG/10ML IV SOLN
INTRAVENOUS | Status: DC | PRN
  Administered 2011-08-15: 50 mg via INTRAVENOUS

## 2011-08-15 MED ORDER — NEOSTIGMINE METHYLSULFATE 1 MG/ML IJ SOLN
INTRAMUSCULAR | Status: DC | PRN
  Administered 2011-08-15: 3 mg via INTRAVENOUS

## 2011-08-15 MED ORDER — MIDAZOLAM HCL 2 MG/2ML IJ SOLN
INTRAMUSCULAR | Status: AC
  Administered 2011-08-15: 1 mg
  Filled 2011-08-15: qty 4

## 2011-08-15 MED ORDER — PROPOFOL 10 MG/ML IV EMUL
INTRAVENOUS | Status: DC | PRN
  Administered 2011-08-15: 100 mg via INTRAVENOUS

## 2011-08-15 MED ORDER — METOPROLOL TARTRATE 1 MG/ML IV SOLN
INTRAVENOUS | Status: AC
  Administered 2011-08-15: 5 mg
  Filled 2011-08-15: qty 5

## 2011-08-15 MED ORDER — GLYCOPYRROLATE 0.2 MG/ML IJ SOLN
INTRAMUSCULAR | Status: DC | PRN
  Administered 2011-08-15: .6 mg via INTRAVENOUS

## 2011-08-15 MED ORDER — ADENOSINE 6 MG/2ML IV SOLN
6.0000 mg | Freq: Once | INTRAVENOUS | Status: AC
  Administered 2011-08-15: 6 mg via INTRAVENOUS

## 2011-08-15 MED ORDER — PHENYLEPHRINE HCL 10 MG/ML IJ SOLN
30.0000 ug/min | INTRAVENOUS | Status: DC
  Administered 2011-08-15: 30 ug/min via INTRAVENOUS
  Filled 2011-08-15 (×2): qty 1

## 2011-08-15 MED ORDER — ONDANSETRON HCL 4 MG/2ML IJ SOLN
4.0000 mg | Freq: Four times a day (QID) | INTRAMUSCULAR | Status: DC | PRN
  Administered 2011-08-15: 4 mg via INTRAVENOUS
  Filled 2011-08-15: qty 2

## 2011-08-15 MED ORDER — FENTANYL CITRATE 0.05 MG/ML IJ SOLN
INTRAMUSCULAR | Status: AC
  Administered 2011-08-15: 100 ug
  Filled 2011-08-15: qty 4

## 2011-08-15 MED ORDER — ACETAMINOPHEN 650 MG RE SUPP
325.0000 mg | Freq: Four times a day (QID) | RECTAL | Status: DC | PRN
  Administered 2011-08-15 (×2): 325 mg via RECTAL
  Filled 2011-08-15 (×2): qty 1

## 2011-08-15 MED ORDER — ENOXAPARIN SODIUM 40 MG/0.4ML ~~LOC~~ SOLN
40.0000 mg | SUBCUTANEOUS | Status: DC
  Administered 2011-08-16 – 2011-08-17 (×2): 40 mg via SUBCUTANEOUS
  Filled 2011-08-15 (×3): qty 0.4

## 2011-08-15 MED ORDER — SODIUM CHLORIDE 0.9 % IV SOLN
50.0000 ug/h | INTRAVENOUS | Status: DC
  Administered 2011-08-15: 50 ug/h via INTRAVENOUS
  Filled 2011-08-15: qty 50

## 2011-08-15 MED ORDER — MIDAZOLAM HCL 5 MG/ML IJ SOLN
2.0000 mg/h | INTRAMUSCULAR | Status: DC
  Administered 2011-08-15: 1 mg/h via INTRAVENOUS
  Filled 2011-08-15 (×2): qty 10

## 2011-08-15 MED ORDER — MIDAZOLAM HCL 5 MG/5ML IJ SOLN
INTRAMUSCULAR | Status: DC | PRN
  Administered 2011-08-15 (×2): 1 mg via INTRAVENOUS

## 2011-08-15 MED ORDER — METOPROLOL TARTRATE 1 MG/ML IV SOLN: 5.0000 mg | INTRAVENOUS | Status: DC | PRN

## 2011-08-15 MED ORDER — ACETAMINOPHEN 10 MG/ML IV SOLN: 1000.0000 mg | Freq: Once | INTRAVENOUS | Status: DC

## 2011-08-15 MED ORDER — ACETAMINOPHEN 160 MG/5ML PO SOLN
325.0000 mg | ORAL | Status: DC | PRN
  Administered 2011-08-15 – 2011-08-16 (×2): 650 mg
  Filled 2011-08-15 (×2): qty 20.3

## 2011-08-15 MED ORDER — ADENOSINE 6 MG/2ML IV SOLN
INTRAVENOUS | Status: AC
  Filled 2011-08-15: qty 4

## 2011-08-15 MED ORDER — CHLORHEXIDINE GLUCONATE 0.12 % MT SOLN
OROMUCOSAL | Status: AC
  Administered 2011-08-15: 15 mL
  Filled 2011-08-15: qty 15

## 2011-08-15 MED ORDER — PHENYLEPHRINE HCL 10 MG/ML IJ SOLN
30.0000 ug/min | INTRAVENOUS | Status: DC
  Administered 2011-08-16: 150 ug/min via INTRAVENOUS
  Administered 2011-08-16: 250 ug/min via INTRAVENOUS
  Administered 2011-08-16: 30 ug/min via INTRAVENOUS
  Administered 2011-08-16: 250 ug/min via INTRAVENOUS
  Filled 2011-08-15 (×4): qty 4

## 2011-08-15 MED ORDER — SODIUM CHLORIDE 0.9 % IV BOLUS (SEPSIS)
1000.0000 mL | Freq: Once | INTRAVENOUS | Status: AC
  Administered 2011-08-15: 1000 mL via INTRAVENOUS

## 2011-08-15 MED ORDER — SODIUM CHLORIDE 0.9 % IR SOLN
Status: DC | PRN
  Administered 2011-08-15: 16:00:00

## 2011-08-15 MED ORDER — MIDAZOLAM BOLUS VIA INFUSION
1.0000 mg | INTRAVENOUS | Status: DC | PRN
  Filled 2011-08-15: qty 2

## 2011-08-15 MED ORDER — ACETAMINOPHEN 650 MG RE SUPP: 650.0000 mg | RECTAL | Status: DC | PRN

## 2011-08-15 MED ORDER — PHENYLEPHRINE HCL 10 MG/ML IJ SOLN
INTRAMUSCULAR | Status: DC | PRN
  Administered 2011-08-15: 80 ug via INTRAVENOUS

## 2011-08-15 MED ORDER — SODIUM CHLORIDE 0.9 % IV BOLUS (SEPSIS)
1000.0000 mL | Freq: Once | INTRAVENOUS | Status: AC
  Administered 2011-08-16: 1000 mL via INTRAVENOUS

## 2011-08-15 MED ORDER — MIDAZOLAM HCL 2 MG/2ML IJ SOLN
2.0000 mg | Freq: Once | INTRAMUSCULAR | Status: AC
  Administered 2011-08-15: 2 mg via INTRAVENOUS

## 2011-08-15 MED ORDER — VANCOMYCIN HCL 1000 MG IV SOLR
1250.0000 mg | Freq: Two times a day (BID) | INTRAVENOUS | Status: DC
  Administered 2011-08-16 – 2011-08-18 (×5): 1250 mg via INTRAVENOUS
  Filled 2011-08-15 (×9): qty 1250

## 2011-08-15 MED ORDER — ONDANSETRON HCL 4 MG/2ML IJ SOLN: 4.0000 mg | Freq: Once | INTRAMUSCULAR | Status: DC | PRN

## 2011-08-15 MED ORDER — FENTANYL BOLUS VIA INFUSION
50.0000 ug | Freq: Four times a day (QID) | INTRAVENOUS | Status: DC | PRN
  Filled 2011-08-15: qty 100

## 2011-08-15 MED ORDER — HYDROMORPHONE HCL PF 1 MG/ML IJ SOLN: 0.2500 mg | INTRAMUSCULAR | Status: DC | PRN

## 2011-08-15 MED ORDER — MORPHINE SULFATE 4 MG/ML IJ SOLN: 0.0500 mg/kg | INTRAMUSCULAR | Status: DC | PRN

## 2011-08-15 MED ORDER — PANTOPRAZOLE SODIUM 40 MG IV SOLR
40.0000 mg | Freq: Every day | INTRAVENOUS | Status: DC
  Administered 2011-08-15: 40 mg via INTRAVENOUS
  Filled 2011-08-15 (×2): qty 40

## 2011-08-15 MED ORDER — LACTATED RINGERS IV SOLN
INTRAVENOUS | Status: DC | PRN
  Administered 2011-08-15 (×2): via INTRAVENOUS

## 2011-08-15 MED ORDER — ACETAMINOPHEN 325 MG PO TABS: 650.0000 mg | ORAL_TABLET | Freq: Four times a day (QID) | ORAL | Status: DC | PRN

## 2011-08-15 SURGICAL SUPPLY — 32 items
CANISTER SUCTION 2500CC (MISCELLANEOUS) ×2 IMPLANT
CLOTH BEACON ORANGE TIMEOUT ST (SAFETY) ×2 IMPLANT
DRAPE C-ARM 42X72 X-RAY (DRAPES) ×2 IMPLANT
DRAPE CARDIOVASCULAR INCISE (DRAPES) ×2
DRAPE INCISE IOBAN 66X45 STRL (DRAPES) ×2 IMPLANT
DRAPE PROXIMA HALF (DRAPES) ×2 IMPLANT
DRAPE SRG 135X102X78XABS (DRAPES) ×1 IMPLANT
DRSG TEGADERM 4X4.75 (GAUZE/BANDAGES/DRESSINGS) ×2 IMPLANT
ELECT REM PT RETURN 9FT ADLT (ELECTROSURGICAL) ×2
ELECTRODE REM PT RTRN 9FT ADLT (ELECTROSURGICAL) ×1 IMPLANT
GAUZE SPONGE 4X4 16PLY XRAY LF (GAUZE/BANDAGES/DRESSINGS) ×2 IMPLANT
GLOVE BIO SURGEON STRL SZ8 (GLOVE) ×4 IMPLANT
GLOVE BIOGEL PI IND STRL 7.5 (GLOVE) ×2 IMPLANT
GLOVE BIOGEL PI INDICATOR 7.5 (GLOVE) ×2
GOWN PREVENTION PLUS XLARGE (GOWN DISPOSABLE) ×2 IMPLANT
GOWN STRL NON-REIN LRG LVL3 (GOWN DISPOSABLE) ×4 IMPLANT
KIT ROOM TURNOVER OR (KITS) ×2 IMPLANT
NEEDLE SPNL 18GX3.5 QUINCKE PK (NEEDLE) ×2 IMPLANT
PAD ARMBOARD 7.5X6 YLW CONV (MISCELLANEOUS) ×4 IMPLANT
PENCIL BUTTON HOLSTER BLD 10FT (ELECTRODE) IMPLANT
SPONGE GAUZE 4X4 12PLY (GAUZE/BANDAGES/DRESSINGS) ×4 IMPLANT
STRIP CLOSURE SKIN 1/2X4 (GAUZE/BANDAGES/DRESSINGS) ×2 IMPLANT
SUT PROLENE 2 0 CT2 30 (SUTURE) IMPLANT
SUT SILK 0 FSL (SUTURE) ×2 IMPLANT
SUT SILK 2 0 SH CR/8 (SUTURE) ×2 IMPLANT
SUT VIC AB 2-0 CT2 18 VCP726D (SUTURE) ×2 IMPLANT
SUT VIC AB 3-0 X1 27 (SUTURE) ×2 IMPLANT
SWAB COLLECTION DEVICE MRSA (MISCELLANEOUS) ×2 IMPLANT
TAPE CLOTH SURG 4X10 WHT LF (GAUZE/BANDAGES/DRESSINGS) ×4 IMPLANT
TOWEL OR 17X24 6PK STRL BLUE (TOWEL DISPOSABLE) ×4 IMPLANT
TUBE CONNECTING 12X1/4 (SUCTIONS) ×2 IMPLANT
YANKAUER SUCT BULB TIP NO VENT (SUCTIONS) ×4 IMPLANT

## 2011-08-15 NOTE — Progress Notes (Signed)
  Echocardiogram 2D Echocardiogram has been performed.  [name] 08/15/2011, 6:12 PM

## 2011-08-15 NOTE — Procedures (Signed)
Name: [name] MRN: [id number] DOB: Apr 12, 1945  DOS:   PROCEDURE NOTE  Procedure:  Endotracheal intubation.  Indication:  Acute respiratory failure  Consent:  Consent was implied due to the emergency nature of the procedure.  Anesthesia:  A total of 10 mg of Etomidate was given intravenously.  Procedure summary:  Appropriate equipment was assembled. The patient was identified as [name] and safety timeout was performed. The patient was placed supine, with head in sniffing position. After adequate level of anesthesia was achieved, a Mac 3 blade was inserted into the oropharynx and the vocal cords were visualized. A 8.0 endotracheal tube was inserted without difficulty and visualized going through the vocal cords. The stylette was removed and cuff inflated. Colorimetric change was noted on the CO2 meter. Breath sounds were heard over both lung fields equally. Post procedure chest xray was ordered.  Complications:  No immediate complications were noted.  Hemodynamic parameters and oxygenation remained stable throughout the procedure.    Orlean Bradford, M.D. Pulmonary and Critical Care Medicine [facility] Cell: [phone] Pager: [phone]  08/15/2011, 7:23 PM

## 2011-08-15 NOTE — Progress Notes (Signed)
Medical Student Daily Progress Note  Subjective: Patient had a rough night. Fevers, coughing, chills, and night sweats all night. This morning his temps are up to 103.3 and he is hoping it breaks soon. Removal of ICD is scheduled for this afternoon in the OR. He has consented to open heart if it presents itself as the best option at that time in the OR during removal. He denies any CP, N/V/D, SOB, HA, or swelling. He is weak and tired resting in bed upon exam.   Objective: Vital signs in last 24 hours: Filed Vitals:   08/15/11 0348 08/15/11 0635 08/15/11 0900 08/15/11 1052  BP:  100/64    Pulse:  108    Temp: 102.3 F (39.1 C) 101.1 F (38.4 C) 100.8 F (38.2 C) 103.3 F (39.6 C)  TempSrc: Rectal Rectal Rectal Rectal  Resp:  20    Height:      Weight:      SpO2:  95%     Weight change: 0.166 kg (5.9 oz)  Intake/Output Summary (Last 24 hours) at 08/15/11 1153 Last data filed at 08/15/11 0900  Gross per 24 hour  Intake    240 ml  Output   1150 ml  Net   -910 ml   Physical Exam: BP 94/61  Pulse 96  Temp(Src) 103.3 F (39.6 C) (Rectal)  Resp 20  Ht 5\' 10"  (1.778 m)  Wt 85.866 kg (189 lb 4.8 oz)  BMI 27.16 kg/m2  SpO2 96% General appearance: alert, cooperative, fatigued and no distress Head: Normocephalic, without obvious abnormality, atraumatic Back: symmetric, no curvature. ROM normal. No CVA tenderness. Lungs: clear to auscultation bilaterally Chest wall: no tenderness Heart: regular rate and rhythm, S1, S2 normal, no murmur, click, rub or gallop Abdomen: soft, non-tender; bowel sounds normal; no masses,  no organomegaly Extremities: extremities normal, atraumatic, no cyanosis or edema Skin: Skin color, texture, turgor normal. No rashes or lesions Lab Results: Basic Metabolic Panel:  Lab 08/14/11 1914 08/13/11 0540  NA 132* 132*  K 4.4 5.2*  CL 96 97  CO2 24 27  GLUCOSE 135* 107*  BUN 13 15  CREATININE 1.07 1.14  CALCIUM 9.4 9.6  MG -- --  PHOS -- --    Liver Function Tests:  Lab 08/08/11 1645  AST 32  ALT 23  ALKPHOS 72  BILITOT 0.3  PROT 7.9  ALBUMIN 2.9*   No results found for this basename: LIPASE:2,AMYLASE:2 in the last 168 hours No results found for this basename: AMMONIA:2 in the last 168 hours CBC:  Lab 08/14/11 0823 08/13/11 0540 08/08/11 1645  WBC 6.4 7.1 --  NEUTROABS -- 4.2 3.6  HGB 12.2* 11.8* --  HCT 37.5* 35.8* --  MCV 83.1 82.1 --  PLT 147* 141* --   Cardiac Enzymes:  Lab 08/09/11 0735 08/08/11 2126 08/08/11 1645  CKTOTAL 14 20 18   CKMB 1.2 1.1 1.2  CKMBINDEX -- -- --  TROPONINI <0.30 <0.30 <0.30   BNP:  Lab 08/08/11 1645  PROBNP 522.1*   D-Dimer: No results found for this basename: DDIMER:2 in the last 168 hours CBG: No results found for this basename: GLUCAP:6 in the last 168 hours Hemoglobin A1C: No results found for this basename: HGBA1C in the last 168 hours Fasting Lipid Panel:  Lab 08/09/11 0735  CHOL 138  HDL 16*  LDLCALC 78  TRIG 782*  CHOLHDL 8.6  LDLDIRECT --   Thyroid Function Tests:  Lab 08/08/11 1645  TSH 1.523  T4TOTAL --  FREET4 --  T3FREE --  THYROIDAB --   Coagulation: No results found for this basename: LABPROT:4,INR:4 in the last 168 hours Anemia Panel:  Lab 08/09/11 1830  VITAMINB12 949*  FOLATE >20.0  FERRITIN 597*  TIBC 243  IRON 21*  RETICCTPCT 1.9   Urine Drug Screen: Drugs of Abuse  No results found for this basename: labopia, cocainscrnur, labbenz, amphetmu, thcu, labbarb    Alcohol Level: No results found for this basename: ETH:2 in the last 168 hours Urinalysis:  Lab 08/08/11 1646  COLORURINE AMBER*  LABSPEC 1.026  PHURINE 5.5  GLUCOSEU NEGATIVE  HGBUR NEGATIVE  BILIRUBINUR SMALL*  KETONESUR NEGATIVE  PROTEINUR NEGATIVE  UROBILINOGEN 1.0  NITRITE NEGATIVE  LEUKOCYTESUR NEGATIVE    Micro Results: Recent Results (from the past 240 hour(s))  CULTURE, BLOOD (ROUTINE X 2)     Status: Normal   Collection Time   08/08/11  4:45 PM       Component Value Range Status Comment   Specimen Description BLOOD LEFT ARM   Final    Special Requests BOTTLES DRAWN AEROBIC AND ANAEROBIC 10CC   Final    Culture  Setup Time [id number]   Final    Culture NO GROWTH 5 DAYS   Final    Report Status 08/15/2011 FINAL   Final   CULTURE, BLOOD (ROUTINE X 2)     Status: Normal   Collection Time   08/08/11  4:45 PM      Component Value Range Status Comment   Specimen Description BLOOD LEFT ARM   Final    Special Requests BOTTLES DRAWN AEROBIC ONLY 3CC   Final    Culture  Setup Time [id number]   Final    Culture NO GROWTH 5 DAYS   Final    Report Status 08/15/2011 FINAL   Final   MRSA PCR SCREENING     Status: Normal   Collection Time   08/08/11  9:28 PM      Component Value Range Status Comment   MRSA by PCR NEGATIVE  NEGATIVE  Final   AFB CULTURE, BLOOD     Status: Normal (Preliminary result)   Collection Time   08/09/11  9:00 PM      Component Value Range Status Comment   Specimen Description BLOOD ARM RIGHT   Final    Special Requests 5CC AFB   Final    Culture     Final    Value: CULTURE WILL BE EXAMINED FOR 6 WEEKS BEFORE ISSUING A FINAL REPORT   Report Status PENDING   Incomplete   CULTURE, BLOOD (ROUTINE X 2)     Status: Normal (Preliminary result)   Collection Time   08/12/11  6:00 PM      Component Value Range Status Comment   Specimen Description BLOOD ARM RIGHT   Final    Special Requests BOTTLES DRAWN AEROBIC ONLY 6CC   Final    Culture  Setup Time [id number]   Final    Culture     Final    Value:        BLOOD CULTURE RECEIVED NO GROWTH TO DATE CULTURE WILL BE HELD FOR 5 DAYS BEFORE ISSUING A FINAL NEGATIVE REPORT   Report Status PENDING   Incomplete   FUNGUS CULTURE, BLOOD     Status: Normal (Preliminary result)   Collection Time   08/12/11  6:00 PM      Component Value Range Status Comment   Specimen Description BLOOD RIGHT ARM   Final    Special  Requests BOTTLES DRAWN AEROBIC ONLY 6CC   Final    Culture NO  FUNGUS ISOLATED;CULTURE IN PROGRESS FOR 7 DAYS   Final    Report Status PENDING   Incomplete   CULTURE, BLOOD (ROUTINE X 2)     Status: Normal (Preliminary result)   Collection Time   08/12/11  6:15 PM      Component Value Range Status Comment   Specimen Description BLOOD ARM LEFT   Final    Special Requests BOTTLES DRAWN AEROBIC AND ANAEROBIC 10CC   Final    Culture  Setup Time [id number]   Final    Culture     Final    Value:        BLOOD CULTURE RECEIVED NO GROWTH TO DATE CULTURE WILL BE HELD FOR 5 DAYS BEFORE ISSUING A FINAL NEGATIVE REPORT   Report Status PENDING   Incomplete   CULTURE, BLOOD (ROUTINE X 2)     Status: Normal   Collection Time   08/13/11 12:40 AM      Component Value Range Status Comment   Specimen Description BLOOD RIGHT ARM   Final    Special Requests BOTTLES DRAWN AEROBIC AND ANAEROBIC 5CC   Final    Culture  Setup Time [id number]   Final    Culture     Final    Value: STAPHYLOCOCCUS SPECIES (COAGULASE NEGATIVE)     Note: THE SIGNIFICANCE OF ISOLATING THIS ORGANISM FROM A SINGLE SET OF BLOOD CULTURES WHEN MULTIPLE SETS ARE DRAWN IS UNCERTAIN. PLEASE NOTIFY THE MICROBIOLOGY DEPARTMENT WITHIN ONE WEEK IF SPECIATION AND SENSITIVITIES ARE REQUIRED.     Note: Gram Stain Report Called to,Read Back By and Verified With: NADINE WELLINGTON 08/14/2011 7:51AM YIMSU   Report Status 08/15/2011 FINAL   Final   CULTURE, BLOOD (ROUTINE X 2)     Status: Normal (Preliminary result)   Collection Time   08/13/11 12:43 AM      Component Value Range Status Comment   Specimen Description BLOOD RIGHT HAND   Final    Special Requests BOTTLES DRAWN AEROBIC AND ANAEROBIC 5CC   Final    Culture  Setup Time [id number]   Final    Culture     Final    Value:        BLOOD CULTURE RECEIVED NO GROWTH TO DATE CULTURE WILL BE HELD FOR 5 DAYS BEFORE ISSUING A FINAL NEGATIVE REPORT   Report Status PENDING   Incomplete   AFB CULTURE, BLOOD     Status: Normal (Preliminary result)   Collection  Time   08/13/11 12:45 AM      Component Value Range Status Comment   Specimen Description BLOOD RIGHT HAND   Final    Special Requests 5CC   Final    Culture     Final    Value: CULTURE WILL BE EXAMINED FOR 6 WEEKS BEFORE ISSUING A FINAL REPORT   Report Status PENDING   Incomplete    Studies/Results: No results found. Medications:  I have reviewed the patient's current medications. Prior to Admission:  Prescriptions prior to admission  Medication Sig Dispense Refill  . Ascorbic Acid (VITAMIN C) 100 MG tablet Take 100 mg by mouth daily.      . calcium carbonate (TUMS - DOSED IN MG ELEMENTAL CALCIUM) 500 MG chewable tablet Chew 1 tablet by mouth 3 (three) times daily.      . diphenhydramine-acetaminophen (TYLENOL PM) 25-500 MG TABS Take 1 tablet by mouth at bedtime as needed. For  pain      . esomeprazole (NEXIUM) 40 MG capsule Take 40 mg by mouth 2 (two) times daily.       . famotidine (PEPCID) 10 MG tablet Take 10 mg by mouth 2 (two) times daily.      . Ibuprofen 200 MG CAPS Take 600-800 mg by mouth every 6 (six) hours as needed. Depending on fever      . metoprolol (LOPRESSOR) 50 MG tablet Take 25 mg by mouth daily. Takes in am      . Multiple Vitamins-Minerals (MULTIVITAMIN) tablet Take 1 tablet by mouth daily with breakfast.   30 tablet    . Phenylephrine-Acetaminophen 5-325 MG TABS Take 1 tablet by mouth every 4 (four) hours as needed. For sinus headache      . traMADol (ULTRAM) 50 MG tablet Take 1 tablet (50 mg total) by mouth every 6 (six) hours as needed for pain.  20 tablet  0  . DISCONTD: acetaminophen (TYLENOL) 500 MG tablet Take 500 mg by mouth every 6 (six) hours as needed. For pain.      [name] DISCONTD: Aspirin-Salicylamide-Caffeine (BC HEADACHE POWDER PO) Take 1 Package by mouth daily as needed. For headaches.      [name] DISCONTD: Flaxseed, Linseed, 1000 MG CAPS Take 1 capsule by mouth daily.       Anti-infectives     Start     Dose/Rate Route Frequency Ordered Stop   08/14/11  1500   gentamicin (GARAMYCIN) 80 mg in sodium chloride irrigation 0.9 % 500 mL irrigation        80 mg Irrigation On call 08/14/11 1412 08/15/11 1500   08/14/11 1500   ceFAZolin (ANCEF) IVPB 2 g/50 mL premix        2 g 100 mL/hr over 30 Minutes Intravenous On call 08/14/11 1412 08/15/11 1500   08/13/11 1200   micafungin (MYCAMINE) 150 mg in sodium chloride 0.9 % 100 mL IVPB     Comments: Wait until fungal blood cultures are drawn before starting      150 mg 100 mL/hr over 1 Hours Intravenous Daily 08/13/11 0948     08/13/11 1000   fluconazole (DIFLUCAN) tablet 100 mg  Status:  Discontinued        100 mg Oral Daily 08/12/11 1125 08/13/11 0948   08/12/11 1200   fluconazole (DIFLUCAN) tablet 200 mg        200 mg Oral Daily 08/12/11 1125 08/12/11 1426         Scheduled Meds:   . aspirin EC  81 mg Oral Daily  . calcium carbonate  1 tablet Oral TID  .  ceFAZolin (ANCEF) IV  2 g Intravenous On Call  . chlorhexidine  60 mL Topical Once  . chlorhexidine  60 mL Topical Once  . diphenhydrAMINE  25 mg Oral Once  . feeding supplement  237 mL Oral BID BM  . gentamicin irrigation  80 mg Irrigation On Call  . metoprolol  25 mg Oral BID  . micafungin [facility]) IV  150 mg Intravenous Daily  . mulitivitamin with minerals  1 tablet Oral Daily  . pantoprazole  40 mg Oral Q1200  . polyethylene glycol  17 g Oral Daily  . senna-docusate  1 tablet Oral BID  . sodium chloride  3 mL Intravenous Q12H  . DISCONTD: feeding supplement  1 Container Oral TID BM   Continuous Infusions:   . sodium chloride 1,000 mL (08/15/11 0621)  . sodium chloride 1,000 mL (08/15/11 1610)  PRN Meds:.acetaminophen, acetaminophen, diphenhydrAMINE, docusate sodium, ondansetron (ZOFRAN) IV, ondansetron, sodium chloride, traMADol Assessment/Plan:  FUO - Tmax 102.3 overnight and 103.3 this am. 1/2 blood cultures revealed Coag negative Staph species. Patient has a history of esophageal candidiasis treated with fluconazole  each time and dilation (as needed for dysphagia). He will undergo removal of his biventricular ICD today with a vegetation growing on the atrial lead which could be one source of his fever. His esophageal candidiasis is also another component of his fever.  Negative labs to date: uric acid, HIV, RPR, Cryptococcal Ag, CT hip, cyclic citrul Ab IgG, Quantiferon gold, Chlamydia, Brucellosis, HLA B27 Tests pending remain: Q fever, leptospirosis -Appreciate ID, GI, cardiology assistance in the care and management of this patient -continue micafungin IV daily  -blood, AFB, fungal cultures pending  M spike workup - Admission CMP revealed protein gap of 5 (total protein - albumin). UPEP revealed increased kappa chains (28.2) in urine (kappa/lambda ratio = 20.58). 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 His calcium remains WNL (9.4) and SCr =1.07 and no s/s of aching bone pains. IFE revealed increased IgA (537) and increased IgM (723). These could be increased acutely for his esophageal disease (IgA in mucosa) and the infection in his heart (IgM acute response to possible coag neg staph in heart vegetation). -Hematology consult, appreciate their assistance in the management of this patient -As hematology recommended yesterday, this is more unlikely to be MM vs. MGUS once this acute episode subsides and original spike will remain  ICD in situ - He is undergoing removal of this today by Dr. Ladona Ridgel 2/2 discovery of mass/vegetation via TEE on 08/12/11.  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 It is believed that this is an extension of his esophageal candidiasis. ICD and mass will be extracted 08/15/11 and sent off for culture. Possibility of open heart today vs. regular removal depending on complexity of atrial lead mass -ICD and artifact removal in am/afternoon  -will add/modify medicinal therapy as needed  GERD/Esophageal stricture - He is s/p esophageal dilation  on 08/12/11. He has had a history of esophageal dilation s/p strictures and candidiasis and poor po intake.  -Diet had been advanced to heart healthy with good response and currently he has been NPO for surgery today  -continue Protonix daily  Ischemic cardiomyopathy - Stable.  -Appreciate cardiology recommendations.  -continue metoprolol BID  -continue ASA  Weight loss, unintentional - His TSH is WNL (1.523). This is likely 2/2 his underlying principal problem but also his esophageal stricture is a large component of this in the past.  -Appreciate GI and nutrition assistance in management of this patient  -after surgery, will continue heart diet  Normocytic anemia - This is acute and has developed since March when hemoglobin was 14. Anemia panel revealing: Iron = 21, Sat ratio = 9, TIBC =243, Ferritin = 597, Retic count =1.9, FOBT negative. Smear revealed mild anemia with normal morphology. Still consistent with AOCD given that TIBC is normal (elevated in IDA) and ferritin high although could still be a component of acute phase reactant. Pt had last colonoscopy in 2012 showing sigmoid and descending diverticula (but no recurrent polyps - last noted to have hyperplastic polyps in 2001).   Thrombocytopenia - Likely related to underlying principal problem. Smear also reveals thrombocytopenia.  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 Multiple causes for this include extension of Candida infection vs. Vitamin B2 deficiency (less likely) vs. Iron deficiency anemia (AOCD more likely at this point), all of which are treatable conditions in this patient. As final diagnosis nears closer, treatment options for this will narrow as well.  -likely will resolve as his esophageal candida resolves completely  Constipation - He has had spells of decreased  po intake during his spells of dysphagia. He is averaging about 1 BM every other day.  -resolved  -continue docusate, miralax, senokot-S  CAD h/o CABG x 4 (2003) - Stable. CE negative x 3 on admission.  -continue metoprolol  -continue ASA  -no statin 2/2 allergy   DVT ppx - SCD's  Disposition - He is in surgery today for removal of his ICD. Will follow-up with him again after recovery.   LOS: 7 days   This is a Psychologist, occupational Note.  The care of the patient was discussed with Dr. Quentin Ore and the assessment and plan formulated with their assistance.  Please see their attached note for official documentation of the daily encounter.  [name] 08/15/2011, 11:53 AM     INTERNAL MEDICINE DAILY PROGRESS NOTE  BLANTON KARDELL MRN: [id number] DOB/AGE: 1945-07-19 66 y.o.  Admit date: 08/08/2011  Subjective: COBE VINEY had fever overnight to 103.3.  Tylenol helps some. Has been feeling febrile with chills and overlying malaise.  Surgery today.   Denies headache, nausea, vomiting, chest pain, shortness of breath, diarrhea or constipation, abdominal pain, swelling or other complaint.  Objective: Vital signs in last 24 hours: Filed Vitals:   08/15/11 0900 08/15/11 1052 08/15/11 1152 08/15/11 1245  BP:   94/61   Pulse:   96   Temp: 100.8 F (38.2 C) 103.3 F (39.6 C)  98.7 F (37.1 C)  TempSrc: Rectal Rectal  Oral  Resp:   20   Height:      Weight:      SpO2:   96%    Weight change: 5.9 oz (0.166 kg)  Intake/Output Summary (Last 24 hours) at 08/15/11 1250 Last data filed at 08/15/11 0900  Gross per 24 hour  Intake    240 ml  Output   1150 ml  Net   -910 ml   Physical Exam: General: resting in bed, diaphoretic HEENT: PERRL, EOMI, no scleral icterus Cardiac: RRR, no rubs, murmurs or gallops Pulm: clear to auscultation bilaterally, moving normal volumes of air Abd: soft, nontender, nondistended, very active BS Ext: warm and well perfused, no pedal  edema Neuro: alert and oriented X3, cranial nerves II-XII grossly intact Lab Results: Basic Metabolic Panel:  Lab 08/14/11 1308 08/13/11 0540  NA 132* 132*  K 4.4 5.2*  CL 96 97  CO2 24 27  GLUCOSE 135* 107*  BUN 13 15  CREATININE 1.07 1.14  CALCIUM 9.4 9.6  MG -- --  PHOS -- --   Liver Function Tests:  Lab 08/08/11 1645  AST 32  ALT 23  ALKPHOS 72  BILITOT 0.3  PROT 7.9  ALBUMIN 2.9*   CBC:  Lab 08/14/11 0823 08/13/11 0540 08/08/11 1645  WBC 6.4 7.1 --  NEUTROABS -- 4.2 3.6  HGB 12.2* 11.8* --  HCT 37.5* 35.8* --  MCV 83.1 82.1 --  PLT 147* 141* --   Cardiac Enzymes:  Lab 08/09/11 0735 08/08/11 2126 08/08/11 1645  CKTOTAL 14 20 18  CKMB 1.2 1.1 1.2  CKMBINDEX -- -- --  TROPONINI <0.30 <0.30 <0.30   BNP:  Lab 08/08/11 1645  PROBNP 522.1*   Fasting Lipid Panel:  Lab 08/09/11 0735  CHOL 138  HDL 16*  LDLCALC 78  TRIG 132*  CHOLHDL 8.6  LDLDIRECT --   Thyroid Function Tests:  Lab 08/08/11 1645  TSH 1.523  T4TOTAL --  FREET4 --  T3FREE --  THYROIDAB --   Anemia Panel:  Lab 08/09/11 1830  VITAMINB12 949*  FOLATE >20.0  FERRITIN 597*  TIBC 243  IRON 21*  RETICCTPCT 1.9   Urinalysis:  Lab 08/08/11 1646  COLORURINE AMBER*  LABSPEC 1.026  PHURINE 5.5  GLUCOSEU NEGATIVE  HGBUR NEGATIVE  BILIRUBINUR SMALL*  KETONESUR NEGATIVE  PROTEINUR NEGATIVE  UROBILINOGEN 1.0  NITRITE NEGATIVE  LEUKOCYTESUR NEGATIVE   Misc. Labs: Uric acid 5.5 RPR nonreactive Cryptococcal antigen negative HIV nonreactive Fecal occult blood test negative Chlamydia tests negative AntiCCP antibodies negative   Micro Results: Recent Results (from the past 240 hour(s))  CULTURE, BLOOD (ROUTINE X 2)     Status: Normal   Collection Time   08/08/11  4:45 PM      Component Value Range Status Comment   Specimen Description BLOOD LEFT ARM   Final    Special Requests BOTTLES DRAWN AEROBIC AND ANAEROBIC 10CC   Final    Culture  Setup Time [id number]   Final     Culture NO GROWTH 5 DAYS   Final    Report Status 08/15/2011 FINAL   Final   CULTURE, BLOOD (ROUTINE X 2)     Status: Normal   Collection Time   08/08/11  4:45 PM      Component Value Range Status Comment   Specimen Description BLOOD LEFT ARM   Final    Special Requests BOTTLES DRAWN AEROBIC ONLY 3CC   Final    Culture  Setup Time [id number]   Final    Culture NO GROWTH 5 DAYS   Final    Report Status 08/15/2011 FINAL   Final   MRSA PCR SCREENING     Status: Normal   Collection Time   08/08/11  9:28 PM      Component Value Range Status Comment   MRSA by PCR NEGATIVE  NEGATIVE  Final   AFB CULTURE, BLOOD     Status: Normal (Preliminary result)   Collection Time   08/09/11  9:00 PM      Component Value Range Status Comment   Specimen Description BLOOD ARM RIGHT   Final    Special Requests 5CC AFB   Final    Culture     Final    Value: CULTURE WILL BE EXAMINED FOR 6 WEEKS BEFORE ISSUING A FINAL REPORT   Report Status PENDING   Incomplete   CULTURE, BLOOD (ROUTINE X 2)     Status: Normal (Preliminary result)   Collection Time   08/12/11  6:00 PM      Component Value Range Status Comment   Specimen Description BLOOD ARM RIGHT   Final    Special Requests BOTTLES DRAWN AEROBIC ONLY [facility]   Final    Culture  Setup Time [id number]   Final    Culture     Final    Value:        BLOOD CULTURE RECEIVED NO GROWTH TO DATE CULTURE WILL BE HELD FOR 5 DAYS BEFORE ISSUING A FINAL NEGATIVE REPORT   Report Status PENDING   Incomplete  FUNGUS CULTURE, BLOOD     Status: Normal (Preliminary result)   Collection Time   08/12/11  6:00 PM      Component Value Range Status Comment   Specimen Description BLOOD RIGHT ARM   Final    Special Requests BOTTLES DRAWN AEROBIC ONLY 6CC   Final    Culture NO FUNGUS ISOLATED;CULTURE IN PROGRESS FOR 7 DAYS   Final    Report Status PENDING   Incomplete   CULTURE, BLOOD (ROUTINE X 2)     Status: Normal (Preliminary result)   Collection Time   08/12/11  6:15 PM       Component Value Range Status Comment   Specimen Description BLOOD ARM LEFT   Final    Special Requests BOTTLES DRAWN AEROBIC AND ANAEROBIC 10CC   Final    Culture  Setup Time [id number]   Final    Culture     Final    Value:        BLOOD CULTURE RECEIVED NO GROWTH TO DATE CULTURE WILL BE HELD FOR 5 DAYS BEFORE ISSUING A FINAL NEGATIVE REPORT   Report Status PENDING   Incomplete   CULTURE, BLOOD (ROUTINE X 2)     Status: Normal   Collection Time   08/13/11 12:40 AM      Component Value Range Status Comment   Specimen Description BLOOD RIGHT ARM   Final    Special Requests BOTTLES DRAWN AEROBIC AND ANAEROBIC 5CC   Final    Culture  Setup Time [id number]   Final    Culture     Final    Value: STAPHYLOCOCCUS SPECIES (COAGULASE NEGATIVE)     Note: THE SIGNIFICANCE OF ISOLATING THIS ORGANISM FROM A SINGLE SET OF BLOOD CULTURES WHEN MULTIPLE SETS ARE DRAWN IS UNCERTAIN. PLEASE NOTIFY THE MICROBIOLOGY DEPARTMENT WITHIN ONE WEEK IF SPECIATION AND SENSITIVITIES ARE REQUIRED.     Note: Gram Stain Report Called to,Read Back By and Verified With: NADINE WELLINGTON 08/14/2011 7:51AM YIMSU   Report Status 08/15/2011 FINAL   Final   CULTURE, BLOOD (ROUTINE X 2)     Status: Normal (Preliminary result)   Collection Time   08/13/11 12:43 AM      Component Value Range Status Comment   Specimen Description BLOOD RIGHT HAND   Final    Special Requests BOTTLES DRAWN AEROBIC AND ANAEROBIC 5CC   Final    Culture  Setup Time [id number]   Final    Culture     Final    Value:        BLOOD CULTURE RECEIVED NO GROWTH TO DATE CULTURE WILL BE HELD FOR 5 DAYS BEFORE ISSUING A FINAL NEGATIVE REPORT   Report Status PENDING   Incomplete   AFB CULTURE, BLOOD     Status: Normal (Preliminary result)   Collection Time   08/13/11 12:45 AM      Component Value Range Status Comment   Specimen Description BLOOD RIGHT HAND   Final    Special Requests 5CC   Final    Culture     Final    Value: CULTURE WILL BE EXAMINED FOR  6 WEEKS BEFORE ISSUING A FINAL REPORT   Report Status PENDING   Incomplete    Studies/Results: No results found. Endoscopy: Showed severe esophageal candidiasis no obvious stricture  Transesophageal echocardiogram: Showed large mass on the patient's right atrial lead  Medications: I have reviewed the patient's current medications. Scheduled Meds:    . aspirin EC  81 mg Oral Daily  .  calcium carbonate  1 tablet Oral TID  .  ceFAZolin (ANCEF) IV  2 g Intravenous On Call  . chlorhexidine  60 mL Topical Once  . chlorhexidine  60 mL Topical Once  . diphenhydrAMINE  25 mg Oral Once  . feeding supplement  237 mL Oral BID BM  . gentamicin irrigation  80 mg Irrigation On Call  . metoprolol  25 mg Oral BID  . micafungin [facility]) IV  150 mg Intravenous Daily  . mulitivitamin with minerals  1 tablet Oral Daily  . pantoprazole  40 mg Oral Q1200  . polyethylene glycol  17 g Oral Daily  . senna-docusate  1 tablet Oral BID  . sodium chloride  3 mL Intravenous Q12H  . DISCONTD: feeding supplement  1 Container Oral TID BM   Continuous Infusions:    . sodium chloride 1,000 mL (08/15/11 0621)  . sodium chloride 1,000 mL (08/15/11 0621)   PRN Meds:.acetaminophen, acetaminophen, diphenhydrAMINE, docusate sodium, ondansetron (ZOFRAN) IV, ondansetron, sodium chloride, traMADol Assessment/Plan:  Pt is a 66 y.o. 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 Patient has severe esophageal candidiasis per EGD done 08/12/2011 also probable mass on right atrial lead shown on TEE 08/12/2011. Obvious concern is whether or not esophageal candidiasis has led to candidemia and subsequent seeding of his hardware. Scheduled for removal of ICD today.   1) fever of unknown origin - origin likely secondary to chronic esophageal candidiasis with occasional dilations.  This is likely lead to transient fungemia with seeding of his pacemaker wires. Patient will require surgical removal of pacemaker wires and culture of vegetation. Plan is to send veg to path for stains/culture if possible. Also would recommend blood cultures at the time of ICD removal, in order to maximize chance of finding causative organism. Patient is currently on micafungin therapy which will cover candidiasis. Single blood culture positive for regulation negative staph almost certainly a contaminant. Will adjust therapy as cultures and studies allow. -- Continue micafungin  -- surgery planned for today -- will need outpatient ophthalmologic evaluation given that he has possible transient candidemia and has had worsening vision.  -- Greatly appreciate the assistance of all consulting teams regarding this very complex and intriguing case   2) GERD/Esophageal stricture - is post procedure day 3 from EGD and dilation on 08/12/11. Advancing diet as tolerated.  -continue Protonix daily   3) possible multiple myeloma - elevated protein gap of 5 at admission. M protein spike seen on SPEP. Ordered immunofixation electrophoresis to confirm diagnosis. 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 Serum immunofixation electrophoresis pending. -- Will get a skeletal survey to finish workup after surgery-- We'll obtain hematology consult today -- Followup immunofixation electrophoresis   4) ICD in situ - patient will require removal of his ICD for vegetation found on right atrial pacemaker lead. Will defer to cardiology for further management of ICD.   5) Ischemic cardiomyopathy - Stable.  -Appreciate cardiology recommendations.  -continue metoprolol BID  -continue ASA   6) Weight loss, unintentional - likely secondary to fever of unknown origin complicated by dysphagia from esophageal stricture and esophageal candidiasis. Diet is n.p.o. for surgery. Will resume heart healthy  after and continue supplementation.   7) Normocytic anemia - anemia is acute and has developed since March when hemoglobin was 14. Anemia panel showing Fe 21, TIBC 241, ferritin  597. Although most consistent with an anemia of chronic disease, transferrin saturation is low, as well, ferritin may be acutely elevated 2/2 to the fevers. Therefore, difficulty to ascertain definitively for now. Pt has last colonoscopy in 2012 showing sigmoid and descending diverticula (but no recurrent polyps - last noted to have hyperplastic polyps in 2001). Reticulocyte index based on reticulocyte % of 1.9 and HCT 36.1 is 1.01. This may indicate hypoproliferation, may be due to his iron deficiency or his anemia of chronic disease, but could also be malignancy related.  FOBT negative. Peripheral smear showed only anemia with thrombocytopenia and no atypical morphologies. -- Further workup deferred as it is likely secondary to anemia of chronic disease from chronic infection  8) Thrombocytopenia - improving today. Smear also confirms thrombocytopenia. Could be secondary to either chronic infection or multiple myeloma. Will pursue valuation and treatment of aforementioned problems.  -heparin products on hold will start after surgery today  9) Constipation - stable, patient had bowel movement yesterday -continue docusate, miralax, and senokot-S   10) CAD h/o CABG x 4 (2003) - Stable. CE negative x 3 on admission. Unable to take statins or ACE inhibitors.  -continue metoprolol  -continue ASA   11) DVT ppx - SCD's   12) Disposition - pending surgical removal of  Vegetation (hopeful) & pacemaker and workup of immune suppression.    LOS: 7 days   Nastashia Gallo 08/15/2011, 12:50 PM

## 2011-08-15 NOTE — Anesthesia Postprocedure Evaluation (Signed)
Anesthesia Post Note  Patient: [name]  Procedure(s) Performed: Procedure(s) (LRB): PACEMAKER LEAD REMOVAL (N/A)  Anesthesia type: general  Patient location: PACU  Post pain: Pain level controlled  Post assessment: Patient's Cardiovascular Status Stable  Last Vitals:  Filed Vitals:   08/15/11 1650  BP: 131/73  Pulse: 92  Temp:   Resp: 24    Post vital signs: Reviewed and stable  Level of consciousness: sedated  Complications: No apparent anesthesia complications

## 2011-08-15 NOTE — Plan of Care (Signed)
IMTS progress note update  Please obtain intraoperative blood cultures (fungal, AFB, bacterial) just after ICD leads are removed.  Note fungal cultures must be ordered in the following way into Epic, or they will not be done properly. Fungus culture, blood   [name] 1:08 PM

## 2011-08-15 NOTE — Progress Notes (Signed)
Subjective: No new complaints  discussed case with patient and Dr. Ladona Ridgel  Antibiotics:  Anti-infectives     Start     Dose/Rate Route Frequency Ordered Stop   08/14/11 1500   gentamicin (GARAMYCIN) 80 mg in sodium chloride irrigation 0.9 % 500 mL irrigation        80 mg Irrigation On call 08/14/11 1412 08/15/11 1500   08/14/11 1500   ceFAZolin (ANCEF) IVPB 2 g/50 mL premix        2 g 100 mL/hr over 30 Minutes Intravenous On call 08/14/11 1412 08/15/11 1430   08/13/11 1200   micafungin (MYCAMINE) 150 mg in sodium chloride 0.9 % 100 mL IVPB     Comments: Wait until fungal blood cultures are drawn before starting      150 mg 100 mL/hr over 1 Hours Intravenous Daily 08/13/11 0948     08/13/11 1000   fluconazole (DIFLUCAN) tablet 100 mg  Status:  Discontinued        100 mg Oral Daily 08/12/11 1125 08/13/11 0948   08/12/11 1200   fluconazole (DIFLUCAN) tablet 200 mg        200 mg Oral Daily 08/12/11 1125 08/12/11 1426          Medications: Scheduled Meds:   . aspirin EC  81 mg Oral Daily  . calcium carbonate  1 tablet Oral TID  .  ceFAZolin (ANCEF) IV  2 g Intravenous On Call  . chlorhexidine  60 mL Topical Once  . chlorhexidine  60 mL Topical Once  . diphenhydrAMINE  25 mg Oral Once  . feeding supplement  237 mL Oral BID BM  . gentamicin irrigation  80 mg Irrigation On Call  . metoprolol  25 mg Oral BID  . micafungin [facility]) IV  150 mg Intravenous Daily  . mulitivitamin with minerals  1 tablet Oral Daily  . pantoprazole  40 mg Oral Q1200  . polyethylene glycol  17 g Oral Daily  . senna-docusate  1 tablet Oral BID  . sodium chloride  3 mL Intravenous Q12H   Continuous Infusions:   . sodium chloride 1,000 mL (08/15/11 0621)  . sodium chloride 1,000 mL (08/15/11 0621)   PRN Meds:.acetaminophen, acetaminophen, acetaminophen, diphenhydrAMINE, docusate sodium, ondansetron (ZOFRAN) IV, ondansetron, sodium chloride, traMADol, DISCONTD:  acetaminophen   Objective: Weight change: 5.9 oz (0.166 kg)  Intake/Output Summary (Last 24 hours) at 08/15/11 1451 Last data filed at 08/15/11 0900  Gross per 24 hour  Intake    120 ml  Output    750 ml  Net   -630 ml   Blood pressure 94/61, pulse 96, temperature 98.7 F (37.1 C), temperature source Oral, resp. rate 20, height 5\' 10"  (1.778 m), weight 189 lb 4.8 oz (85.866 kg), SpO2 96.00%. Temp:  [98.4 F (36.9 C)-103.3 F (39.6 C)] 98.7 F (37.1 C) (05/16 1245) Pulse Rate:  [96-108] 96  (05/16 1152) Resp:  [20-22] 20  (05/16 1152) BP: (94-143)/(57-74) 94/61 mmHg (05/16 1152) SpO2:  [95 %-99 %] 96 % (05/16 1152) Weight:  [189 lb 4.8 oz (85.866 kg)] 189 lb 4.8 oz (85.866 kg) (05/15 2126)  Physical Exam: General appearance: alert, cooperative and no distress  Resp: clear to auscultation bilaterally  Cardio: regular rate and rhythm, S1, S2 normal, no murmur, click, rub or gallop  GI: soft, non-tender; bowel sounds normal; no masses, no organomegaly  Extremities: extremities normal, atraumatic, no cyanosis or edema  Lab Results:  Basename 08/14/11 0823 08/13/11 0540  WBC 6.4 7.1  HGB 12.2* 11.8*  HCT 37.5* 35.8*  PLT 147* 141*    BMET  Basename 08/14/11 0823 08/13/11 0540  NA 132* 132*  K 4.4 5.2*  CL 96 97  CO2 24 27  GLUCOSE 135* 107*  BUN 13 15  CREATININE 1.07 1.14  CALCIUM 9.4 9.6    Micro Results: Recent Results (from the past 240 hour(s))  CULTURE, BLOOD (ROUTINE X 2)     Status: Normal   Collection Time   08/08/11  4:45 PM      Component Value Range Status Comment   Specimen Description BLOOD LEFT ARM   Final    Special Requests BOTTLES DRAWN AEROBIC AND ANAEROBIC 10CC   Final    Culture  Setup Time [id number]   Final    Culture NO GROWTH 5 DAYS   Final    Report Status 08/15/2011 FINAL   Final   CULTURE, BLOOD (ROUTINE X 2)     Status: Normal   Collection Time   08/08/11  4:45 PM      Component Value Range Status Comment   Specimen Description  BLOOD LEFT ARM   Final    Special Requests BOTTLES DRAWN AEROBIC ONLY 3CC   Final    Culture  Setup Time [id number]   Final    Culture NO GROWTH 5 DAYS   Final    Report Status 08/15/2011 FINAL   Final   MRSA PCR SCREENING     Status: Normal   Collection Time   08/08/11  9:28 PM      Component Value Range Status Comment   MRSA by PCR NEGATIVE  NEGATIVE  Final   AFB CULTURE, BLOOD     Status: Normal (Preliminary result)   Collection Time   08/09/11  9:00 PM      Component Value Range Status Comment   Specimen Description BLOOD ARM RIGHT   Final    Special Requests 5CC AFB   Final    Culture     Final    Value: CULTURE WILL BE EXAMINED FOR 6 WEEKS BEFORE ISSUING A FINAL REPORT   Report Status PENDING   Incomplete   CULTURE, BLOOD (ROUTINE X 2)     Status: Normal (Preliminary result)   Collection Time   08/12/11  6:00 PM      Component Value Range Status Comment   Specimen Description BLOOD ARM RIGHT   Final    Special Requests BOTTLES DRAWN AEROBIC ONLY 6CC   Final    Culture  Setup Time [id number]   Final    Culture     Final    Value:        BLOOD CULTURE RECEIVED NO GROWTH TO DATE CULTURE WILL BE HELD FOR 5 DAYS BEFORE ISSUING A FINAL NEGATIVE REPORT   Report Status PENDING   Incomplete   FUNGUS CULTURE, BLOOD     Status: Normal (Preliminary result)   Collection Time   08/12/11  6:00 PM      Component Value Range Status Comment   Specimen Description BLOOD RIGHT ARM   Final    Special Requests BOTTLES DRAWN AEROBIC ONLY 6CC   Final    Culture NO FUNGUS ISOLATED;CULTURE IN PROGRESS FOR 7 DAYS   Final    Report Status PENDING   Incomplete   CULTURE, BLOOD (ROUTINE X 2)     Status: Normal (Preliminary result)   Collection Time   08/12/11  6:15 PM      Component Value Range Status  Comment   Specimen Description BLOOD ARM LEFT   Final    Special Requests BOTTLES DRAWN AEROBIC AND ANAEROBIC 10CC   Final    Culture  Setup Time [id number]   Final    Culture     Final    Value:         BLOOD CULTURE RECEIVED NO GROWTH TO DATE CULTURE WILL BE HELD FOR 5 DAYS BEFORE ISSUING A FINAL NEGATIVE REPORT   Report Status PENDING   Incomplete   CULTURE, BLOOD (ROUTINE X 2)     Status: Normal   Collection Time   08/13/11 12:40 AM      Component Value Range Status Comment   Specimen Description BLOOD RIGHT ARM   Final    Special Requests BOTTLES DRAWN AEROBIC AND ANAEROBIC 5CC   Final    Culture  Setup Time [id number]   Final    Culture     Final    Value: STAPHYLOCOCCUS SPECIES (COAGULASE NEGATIVE)     Note: THE SIGNIFICANCE OF ISOLATING THIS ORGANISM FROM A SINGLE SET OF BLOOD CULTURES WHEN MULTIPLE SETS ARE DRAWN IS UNCERTAIN. PLEASE NOTIFY THE MICROBIOLOGY DEPARTMENT WITHIN ONE WEEK IF SPECIATION AND SENSITIVITIES ARE REQUIRED.     Note: Gram Stain Report Called to,Read Back By and Verified With: NADINE WELLINGTON 08/14/2011 7:51AM YIMSU   Report Status 08/15/2011 FINAL   Final   CULTURE, BLOOD (ROUTINE X 2)     Status: Normal (Preliminary result)   Collection Time   08/13/11 12:43 AM      Component Value Range Status Comment   Specimen Description BLOOD RIGHT HAND   Final    Special Requests BOTTLES DRAWN AEROBIC AND ANAEROBIC 5CC   Final    Culture  Setup Time [id number]   Final    Culture     Final    Value:        BLOOD CULTURE RECEIVED NO GROWTH TO DATE CULTURE WILL BE HELD FOR 5 DAYS BEFORE ISSUING A FINAL NEGATIVE REPORT   Report Status PENDING   Incomplete   AFB CULTURE, BLOOD     Status: Normal (Preliminary result)   Collection Time   08/13/11 12:45 AM      Component Value Range Status Comment   Specimen Description BLOOD RIGHT HAND   Final    Special Requests 5CC   Final    Culture     Final    Value: CULTURE WILL BE EXAMINED FOR 6 WEEKS BEFORE ISSUING A FINAL REPORT   Report Status PENDING   Incomplete     Studies/Results: No results found.    Assessment/Plan: [name] is a 66 y.o. male with  Extensive workup for FUO now found to have  vegetation on PM lead, also with candida esophagitis  1) FUO: highly likely due to the vegetation on lead- --agree with removal of leads --Agree with attempts to culture material from pacemaker leads for bacteria fungi and AFB --If these cultures are unrevealing we may need to treat the patient with a antibiotic regimen for culture negative endocarditis, possibly including therapy for fungal infection though my suspicion is much lower for candida infection of the lead  2) esophagitis: find to continue the micafungin   LOS: 7 days   Acey Lav 08/15/2011, 2:51 PM
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
## 2011-08-15 NOTE — Anesthesia Preprocedure Evaluation (Addendum)
Anesthesia Evaluation  Patient identified by MRN, date of birth, ID band Patient awake    Reviewed: Allergy & Precautions, H&P , NPO status , Patient's Chart, lab work & pertinent test results  Airway Mallampati: I TM Distance: >3 FB Neck ROM: Full    Dental  (+) Edentulous Upper, Edentulous Lower and Dental Advisory Given   Pulmonary          Cardiovascular hypertension, Pt. on medications + CAD, + Past MI and + CABG + Cardiac Defibrillator     Neuro/Psych    GI/Hepatic GERD-  Medicated and Controlled,  Endo/Other    Renal/GU      Musculoskeletal   Abdominal   Peds  Hematology   Anesthesia Other Findings   Reproductive/Obstetrics                          Anesthesia Physical Anesthesia Plan  ASA: III  Anesthesia Plan: General   Post-op Pain Management:    Induction: Intravenous  Airway Management Planned: Oral ETT  Additional Equipment: Arterial line  Intra-op Plan:   Post-operative Plan:   Informed Consent: I have reviewed the patients History and Physical, chart, labs and discussed the procedure including the risks, benefits and alternatives for the proposed anesthesia with the patient or authorized representative who has indicated his/her understanding and acceptance.     Plan Discussed with: CRNA and Surgeon  Anesthesia Plan Comments:         Anesthesia Quick Evaluation

## 2011-08-15 NOTE — Op Note (Signed)
BiV ICD removal extractivon via the left subclavian vein complicated by transient hypotension which resolved spontaneously.[id number].

## 2011-08-15 NOTE — Progress Notes (Signed)
eLink Physician-Brief Progress Note Patient Name: [name] DOB: [birth year] MRN: [id number]  Date of Service  08/15/2011   HPI/Events of Note  Severe hypotension on precedex.  This was held with no improvement in BP.  Current cuff pressure of 69/44 (49).  Do not have central line to assess CVP.  Lactate and PCT are elevated.   eICU Interventions  Plan: Start NEO gtt due to ongoing tachycardia Discuss with Dr. Herma Carson placement on CVL for CVP assessments Replace non-functioning aline.    Intervention Category Major Interventions: Shock - evaluation and management;Sepsis - evaluation and management  [name] 08/15/2011, 10:14 PM

## 2011-08-15 NOTE — Transfer of Care (Signed)
Immediate Anesthesia Transfer of Care Note  Patient: [name]  Procedure(s) Performed: Procedure(s) (LRB): PACEMAKER LEAD REMOVAL (N/A)  Patient Location: PACU  Anesthesia Type: General  Level of Consciousness: awake and alert   Airway & Oxygen Therapy: Patient Spontanous Breathing and Patient connected to face mask oxygen  Post-op Assessment: Report given to PACU RN and Post -op Vital signs reviewed and stable  Post vital signs: Reviewed and stable  Complications: No apparent anesthesia complications

## 2011-08-16 ENCOUNTER — Inpatient Hospital Stay (HOSPITAL_COMMUNITY): Payer: Medicare Other

## 2011-08-16 ENCOUNTER — Encounter (HOSPITAL_COMMUNITY): Payer: Self-pay | Admitting: Internal Medicine

## 2011-08-16 LAB — COMPREHENSIVE METABOLIC PANEL
ALT: 12 U/L (ref 0–53)
Alkaline Phosphatase: 40 U/L (ref 39–117)
BUN: 21 mg/dL (ref 6–23)
CO2: 20 mEq/L (ref 19–32)
GFR calc Af Amer: 51 mL/min — ABNORMAL LOW (ref >90)
GFR calc non Af Amer: 44 mL/min — ABNORMAL LOW (ref >90)
Glucose, Bld: 133 mg/dL — ABNORMAL HIGH (ref 70–99)
Potassium: 4.9 mEq/L (ref 3.5–5.1)
Sodium: 133 mEq/L — ABNORMAL LOW (ref 135–145)
Total Bilirubin: 0.5 mg/dL (ref 0.3–1.2)
Total Protein: 6 g/dL (ref 6.0–8.3)

## 2011-08-16 LAB — CBC
MCH: 26.6 pg (ref 26.0–34.0)
MCHC: 32.1 g/dL (ref 30.0–36.0)
MCV: 83 fL (ref 78.0–100.0)
Platelets: 130 10*3/uL — ABNORMAL LOW (ref 150–400)
RDW: 16 % — ABNORMAL HIGH (ref 11.5–15.5)

## 2011-08-16 LAB — IMMUNOFIXATION ELECTROPHORESIS
IgA: 537 mg/dL — ABNORMAL HIGH (ref 68–379)
IgG (Immunoglobin G), Serum: 1530 mg/dL (ref 650–1600)

## 2011-08-16 LAB — MAGNESIUM: Magnesium: 1.6 mg/dL (ref 1.5–2.5)

## 2011-08-16 MED ORDER — SODIUM CHLORIDE 0.9 % IV SOLN
INTRAVENOUS | Status: DC
  Administered 2011-08-16: 20:00:00 via INTRAVENOUS

## 2011-08-16 MED ORDER — BIOTENE DRY MOUTH MT LIQD
15.0000 mL | Freq: Four times a day (QID) | OROMUCOSAL | Status: DC
  Administered 2011-08-16 – 2011-08-23 (×11): 15 mL via OROMUCOSAL

## 2011-08-16 MED ORDER — FENTANYL CITRATE 0.05 MG/ML IJ SOLN
12.5000 ug | INTRAMUSCULAR | Status: DC | PRN
  Administered 2011-08-16 (×2): 25 ug via INTRAVENOUS
  Administered 2011-08-16: 12.5 ug via INTRAVENOUS
  Administered 2011-08-17 – 2011-08-18 (×9): 25 ug via INTRAVENOUS
  Filled 2011-08-16 (×13): qty 2

## 2011-08-16 MED ORDER — ACETAMINOPHEN 325 MG PO TABS
325.0000 mg | ORAL_TABLET | ORAL | Status: DC | PRN
  Administered 2011-08-16 – 2011-08-22 (×12): 650 mg via ORAL
  Filled 2011-08-16 (×11): qty 2

## 2011-08-16 MED ORDER — SODIUM CHLORIDE 0.9 % IV BOLUS (SEPSIS)
1000.0000 mL | Freq: Once | INTRAVENOUS | Status: AC
  Administered 2011-08-16: 1000 mL via INTRAVENOUS

## 2011-08-16 MED ORDER — CHLORHEXIDINE GLUCONATE 0.12 % MT SOLN
15.0000 mL | Freq: Two times a day (BID) | OROMUCOSAL | Status: DC
  Administered 2011-08-16 – 2011-08-22 (×11): 15 mL via OROMUCOSAL
  Filled 2011-08-16 (×17): qty 15

## 2011-08-16 MED ORDER — METOPROLOL TARTRATE 1 MG/ML IV SOLN
2.5000 mg | INTRAVENOUS | Status: DC | PRN
  Filled 2011-08-16: qty 5

## 2011-08-16 NOTE — Progress Notes (Signed)
Patient ID: [name], male   DOB: 18-Nov-1945, 66 y.o.   MRN: [id number] Subjective:  Awake and alert but remains intubated.  Objective:  Vital Signs in the last 24 hours: Temp:  [97.5 F (36.4 C)-104 F (40 C)] 98.7 F (37.1 C) (05/17 0758) Pulse Rate:  [64-135] 69  (05/17 0600) Resp:  [0-36] 24  (05/17 0600) BP: (69-151)/(44-100) 94/67 mmHg (05/17 0600) SpO2:  [87 %-100 %] 99 % (05/17 0600) Arterial Line BP: (98-222)/(44-93) 99/44 mmHg (05/16 1735) FiO2 (%):  [30 %-100 %] 30 % (05/17 0816) Weight:  [199 lb 15.3 oz (90.7 kg)-200 lb 13.4 oz (91.1 kg)] 199 lb 15.3 oz (90.7 kg) (05/17 0500)  Intake/Output from previous day: 05/16 0701 - 05/17 0700 In: 6885.5 [I.V.:2403.5; NG/GT:120; IV Piggyback:4362] Out: 760 [Urine:760] Intake/Output from this shift:    Physical Exam: alert appearing NAD HEENT: Unremarkable except diskempt with ETT tube in place. Neck:  No JVD, no thyromegally Lungs:  Scattered rales. No wheezes or rhonchi HEART:  Regular rate rhythm, no murmurs, no rubs, no clicks Abd:  Flat, positive bowel sounds, no organomegally, no rebound, no guarding Ext:  2 plus pulses, no edema, no cyanosis, no clubbing Skin:  No rashes no nodules Neuro:  CN II through XII intact, motor grossly intact  Lab Results:  Basename 08/16/11 0445 08/15/11 1730  WBC 16.6* 7.4  HGB 9.4* 11.4*  PLT 130* 125*    Basename 08/16/11 0445 08/15/11 1730  NA 133* 132*  K 4.9 5.1  CL 103 95*  CO2 20 22  GLUCOSE 133* 195*  BUN 21 17  CREATININE 1.59* 1.22   No results found for this basename: TROPONINI:2,CK,MB:2 in the last 72 hours Hepatic Function Panel  Basename 08/16/11 0445  PROT 6.0  ALBUMIN 2.1*  AST 26  ALT 12  ALKPHOS 40  BILITOT 0.5  BILIDIR --  IBILI --   No results found for this basename: CHOL in the last 72 hours No results found for this basename: PROTIME in the last 72 hours  Imaging: Portable Chest Xray In Am  08/16/2011  *RADIOLOGY REPORT*  Clinical  Data: Evaluate lung fields and endotracheal tube position  PORTABLE CHEST - 1 VIEW  Comparison: Chest radiograph 08/15/2011  Findings: Endotracheal tube and NG tube unchanged.  Sternal wires overlie stable cardiac silhouette.  There is an increase in central venous congestion compared to prior.  Mild basilar atelectasis.  No pneumothorax.  IMPRESSION:  1.  Stable support apparatus. 2.  Increase in central venous pulmonary congestion.  Original Report Authenticated By: Genevive Bi, M.D.   Dg Chest Port 1 View  08/15/2011  *RADIOLOGY REPORT*  Clinical Data: Shortness of breath.  Cough.  Fever.  PORTABLE CHEST - 1 VIEW  Comparison: 08/08/2011  Findings: Stable appearance of postoperative changes in the mediastinum.  The cardiac pacemaker has been removed since the previous study.  Cardiac enlargement with increased pulmonary vascularity suggesting vascular congestion.  No significant edema or consolidation.  No blunting of costophrenic angles.  No pneumothorax.  IMPRESSION: Interval removal of cardiac pacemaker.  Cardiac enlargement with pulmonary vascular congestion.  No edema or consolidation.  Original Report Authenticated By: Marlon Pel, M.D.   Portable Chest Xray  08/15/2011  *RADIOLOGY REPORT*  Clinical Data: Endotracheal tube placement.  PORTABLE CHEST - 1 VIEW  Comparison: 08/15/2011  Findings: Endotracheal tube is 3.7 cm above the carina. Nasogastric tube enters the stomach.  Prior CABG noted.  Mild cardiomegaly is present.  Mild vascular indistinctness  noted without overt edema.  IMPRESSION:  1.  Endotracheal tube is satisfactorily positioned with tip 3.7 cm above the carina. 2.  Mild cardiomegaly, with borderline appearance for pulmonary venous hypertension.  No overt edema.  Original Report Authenticated By: Dellia Cloud, M.D.   Dg C-arm 1-60 Min-no Report  08/15/2011  CLINICAL DATA: ICD extraction   C-ARM 1-60 MINUTES  Fluoroscopy was utilized by the requesting physician.  No  radiographic  interpretation.      Cardiac Studies: 2D echo - normal RV function. LV dysfunction with paradoxical septal motion. Assessment/Plan:  1. SBE involving ICD lead 2. S/p extraction, complicated by hypotension 3. Likely septic shock, requiring Neo 4. VDRF secondary to #3. Rec: appreciate help of pulmonary critical care medicine. Hopefully the combination of broad spectrum antibiotics, fluid and pressor support and time will result in continued clinical improvement. Cultures pending. I suspect he will require long term broad spectrum anti-biotic/anti-fungal coverage. I will defer to infectious disease service on this question, however.  LOS: 8 days    Lewayne Bunting, M.D. 08/16/2011, 8:31 AM

## 2011-08-16 NOTE — Progress Notes (Signed)
Nutrition Follow-up  S/P Biventricular ICD extraction 5/16 due to vegetation on the pacing lead (endocarditis).  Patient was intubated 5/16, extubated this morning.  Diet Order:  NPO except for ice chips; orders to remove NGT and advance diet as tolerated  Previous diet:  Heart Healthy 5/14-5/15; Full Liquids 5/13-5/14  Supplements:  Ensure Complete PO BID, MVI daily  Meds: Scheduled Meds:   . adenosine (ADENOCARD) IV  6 mg Intravenous Once  . adenosine      . antiseptic oral rinse  15 mL Mouth Rinse QID  . aspirin EC  81 mg Oral Daily  .  ceFAZolin (ANCEF) IV  2 g Intravenous On Call  . chlorhexidine  15 mL Mouth/Throat BID  . chlorhexidine      . enoxaparin  40 mg Subcutaneous Q24H  . etomidate      . feeding supplement  237 mL Oral BID BM  . fentaNYL      . fentaNYL  100 mcg Intravenous Once  . gentamicin irrigation   Irrigation Once  . metoprolol      . metoprolol  25 mg Oral BID  . micafungin [facility]) IV  150 mg Intravenous Daily  . midazolam      . midazolam  2 mg Intravenous Once  . mulitivitamin with minerals  1 tablet Oral Daily  . piperacillin-tazobactam (ZOSYN)  IV  3.375 g Intravenous Q8H  . polyethylene glycol  17 g Oral Daily  . sodium chloride  1,000 mL Intravenous Once  . sodium chloride  1,000 mL Intravenous Once  . sodium chloride  1,000 mL Intravenous Once  . sodium chloride  1,000 mL Intravenous Once  . vancomycin  1,250 mg Intravenous Q12H  . DISCONTD: acetaminophen  1,000 mg Intravenous Once  . DISCONTD: calcium carbonate  1 tablet Oral TID  . DISCONTD: gentamicin irrigation  80 mg Irrigation On Call  . DISCONTD: gentamycin 80 mg in 0.9% normal saline 250 mL irrigation  1 application Irrigation Once  . DISCONTD: meperidine (DEMEROL) injection  25 mg Intravenous Once  . DISCONTD: pantoprazole  40 mg Oral Q1200  . DISCONTD: pantoprazole (PROTONIX) IV  40 mg Intravenous Daily  . DISCONTD: senna-docusate  1 tablet Oral BID  . DISCONTD: sodium chloride   3 mL Intravenous Q12H   Continuous Infusions:   . phenylephrine (NEO-SYNEPHRINE) Adult infusion 100 mcg/min (08/16/11 0900)  . DISCONTD: sodium chloride 1,000 mL (08/15/11 0621)  . DISCONTD: sodium chloride 1,000 mL (08/15/11 0621)  . DISCONTD: dexmedetomidine (PRECEDEX) IV infusion Stopped (08/15/11 2146)  . DISCONTD: fentaNYL infusion INTRAVENOUS 25 mcg/hr (08/16/11 0800)  . DISCONTD: midazolam (VERSED) infusion 0.5 mg/hr (08/15/11 2346)  . DISCONTD: phenylephrine (NEO-SYNEPHRINE) Adult infusion 150 mcg/min (08/15/11 2327)   PRN Meds:.acetaminophen (TYLENOL) oral liquid 160 mg/5 mL, fentaNYL, metoprolol, ondansetron (ZOFRAN) IV, DISCONTD: acetaminophen, DISCONTD: acetaminophen, DISCONTD: acetaminophen, DISCONTD: acetaminophen, DISCONTD: diphenhydrAMINE, DISCONTD: docusate sodium, DISCONTD: fentaNYL, DISCONTD: fentaNYL, DISCONTD: gentamycin 80 mg in 0.9% normal saline 250 mL irrigation, DISCONTD:  HYDROmorphone (DILAUDID) injection, DISCONTD: lidocaine, DISCONTD: metoprolol DISCONTD: midazolam, DISCONTD:  morphine injection, DISCONTD: ondansetron (ZOFRAN) IV, DISCONTD: ondansetron (ZOFRAN) IV, DISCONTD: ondansetron, DISCONTD: sodium chloride, DISCONTD: traMADol  Labs:  CMP     Component Value Date/Time   NA 133* 08/16/2011 0445   K 4.9 08/16/2011 0445   CL 103 08/16/2011 0445   CO2 20 08/16/2011 0445   GLUCOSE 133* 08/16/2011 0445   BUN 21 08/16/2011 0445   CREATININE 1.59* 08/16/2011 0445   CREATININE 1.10 07/29/2011 1711   CALCIUM  7.4* 08/16/2011 0445   PROT 6.0 08/16/2011 0445   ALBUMIN 2.1* 08/16/2011 0445   AST 26 08/16/2011 0445   ALT 12 08/16/2011 0445   ALKPHOS 40 08/16/2011 0445   BILITOT 0.5 08/16/2011 0445   GFRNONAA 44* 08/16/2011 0445   GFRAA 51* 08/16/2011 0445   CBG (last 3)   Basename 08/15/11 1819  GLUCAP 113*     Intake/Output Summary (Last 24 hours) at 08/16/11 1048 Last data filed at 08/16/11 1000  Gross per 24 hour  Intake 7020.54 ml  Output    810 ml  Net  6210.54 ml    Weight Status:  90.7 kg, up from 85.7 kg (5/15) with positive fluid balance  Re-estimated needs:  1900-2100 kcals, 105-120 grams protein, >2 liters fluid daily  Nutrition Dx:  Swallowing difficulty, ongoing.  Goal:  Pt to consume at least 75% of estimated needs with meals and supplements, unmet with current NPO status.  Intervention:  Continue Ensure Complete BID when diet advanced.  Monitor:  Diet advancement, PO intake, weight trend, labs.   Hettie Holstein Pager #:  [phone]

## 2011-08-16 NOTE — Progress Notes (Signed)
Name: [name] MRN: [id number] DOB: 11/28/1945    LOS: 8  [facility]  Pt Profile:  65 yo male with hx CAD s/p CABG, ICD placement approx 4 years ago, CHF (EF 25-30%) admitted 5/9 with persistent fevers up to 104.4 due to vegetations on ICD lead.  ICD removed 5/16. Resp distress requiring intubation 5/16 PM. Extubated 5/17 AM  Lines / Drains: R rad aline 5/16 >> 5/17 ETT 5/16 >> 5/17 L femoral cordis 5/16 >>   Cultures: Wound (ICD pocket) 5/16>>> BCx2 5/14>>> 1/2 coag neg staph >>   BC X 2 5/17 >>   Antibiotics: micafungin 5/1 >> Vanc 5/16 >> Zosyn 5/16 >>  Tests / Events: 5/13 Echo: LVEF 25% - 30%. Mild MR. LA mildly dilated. Large mass noted on right atrial lead  5/16 ICD removed 5/16 Echo: moderate LVH.  EF was 25%. Diffuse hypokinesis   Subj: RASS 0. CAM-ICU negative. Passed SBT. Extubated and looks good.  Remains on pressors     Vital Signs: Temp:  [97.5 F (36.4 C)-104 F (40 C)] 98.7 F (37.1 C) (05/17 0758) Pulse Rate:  [64-135] 88  (05/17 1000) Resp:  [0-36] 28  (05/17 1000) BP: (69-151)/(44-100) 99/58 mmHg (05/17 1000) SpO2:  [87 %-100 %] 96 % (05/17 1000) Arterial Line BP: (98-222)/(44-93) 99/44 mmHg (05/16 1735) FiO2 (%):  [30 %-100 %] 30 % (05/17 0816) Weight:  [90.7 kg (199 lb 15.3 oz)-91.1 kg (200 lb 13.4 oz)] 90.7 kg (199 lb 15.3 oz) (05/17 0500) I/O last 3 completed shifts: In: 7005.5 [P.O.:120; I.V.:2403.5; NG/GT:120; IV Piggyback:4362] Out: 1510 [Urine:1510]  Physical Examination:  General: chronically ill appearing, NAD HEENT: mm dry, no JVD  Neuro: drowsy post op, appropriate  Lungs: resps even non labored, diminished bases otherwise clear  Cards: s1s2 rrr, no m/r/g Abd: soft, round, non tender, +bs Ext: warm and dry, no edema    Labs and Imaging:   CXR: NSC  CBC    Component Value Date/Time   WBC 16.6* 08/16/2011 0445   RBC 3.53* 08/16/2011 0445   HGB 9.4* 08/16/2011 0445   HCT 29.3* 08/16/2011 0445   PLT 130* 08/16/2011  0445   MCV 83.0 08/16/2011 0445   MCH 26.6 08/16/2011 0445   MCHC 32.1 08/16/2011 0445   RDW 16.0* 08/16/2011 0445   LYMPHSABS 2.1 08/13/2011 0540   MONOABS 0.6 08/13/2011 0540   EOSABS 0.1 08/13/2011 0540   BASOSABS 0.1 08/13/2011 0540    BMET    Component Value Date/Time   NA 133* 08/16/2011 0445   K 4.9 08/16/2011 0445   CL 103 08/16/2011 0445   CO2 20 08/16/2011 0445   GLUCOSE 133* 08/16/2011 0445   BUN 21 08/16/2011 0445   CREATININE 1.59* 08/16/2011 0445   CREATININE 1.10 07/29/2011 1711   CALCIUM 7.4* 08/16/2011 0445   GFRNONAA 44* 08/16/2011 0445   GFRAA 51* 08/16/2011 0445     ABG    Component Value Date/Time   PHART 7.409 08/15/2011 2048   PCO2ART 44.2 08/15/2011 2048   PO2ART 385.0* 08/15/2011 2048   HCO3 27.3* 08/15/2011 2048   TCO2 28 08/15/2011 2048   ACIDBASEDEF 5.0* 08/15/2011 1827   O2SAT 100.0 08/15/2011 2048      Assessment and Plan: Acute respiratory failure  Endocarditis -- s/p lead extraction   Septic shock - r/t endocarditis  Anemia  Ischemic caridomyopathy - per cards  Hyponatremia  Esophagitis   PLAN -   Extubate today - done. Looks good post extubation  Routine  post extubation care  Wean pressors for MAP > 60 mmHg  Abx per ID  Mgmt of CM     35 mins CCM time  Billy Fischer, MD;  PCCM service; Mobile [phone]

## 2011-08-16 NOTE — Procedures (Signed)
Extubation Procedure Note  Patient Details:   Name: [name] DOB: Aug 04, 1945 MRN: [id number]   Airway Documentation:  Airway 8 mm (Active)  Secured at (cm) 23 cm 08/16/2011  8:16 AM  Measured From Lips 08/16/2011  8:16 AM  Secured Location Right 08/16/2011  8:16 AM  Secured By [employer] 08/16/2011  8:16 AM  Tube Holder Repositioned Yes 08/16/2011  8:16 AM  Cuff Pressure (cm H2O) 22 cm H2O 08/16/2011  8:16 AM  Site Condition Dry 08/16/2011  8:16 AM    Evaluation  O2 sats: stable throughout Complications: No apparent complications Patient did tolerate procedure well. Bilateral Breath Sounds: Clear;Diminished Suctioning: Airway Yes Pt. Able to hold head off bed x10 sec., Nif-20, Fvc-1400cc, (+) cuff leak, tolerated procedure well, placed 2lpm n/ on s/p , RT to monitor. Joylene John 08/16/2011, 10:00 AM

## 2011-08-16 NOTE — Progress Notes (Signed)
Subjective: Pt  status post removal of pacemaker wires have been sent for culture. Postoperatively became febrile to nearly 104 he was also hypotensive and tachycardic. He was started on broad-spectrum antibiotics in the form of vancomycin and Zosyn and transferred to intensive care unit where he remains on pressors and remains intubated. Is not clear to me if he may have received cefazolin preoperatively which would'veundermined our ability to isolate an organism  discussed case with patient and Dr. Ladona Ridgel and Dr. Sung Amabile  Antibiotics:  Anti-infectives     Start     Dose/Rate Route Frequency Ordered Stop   08/15/11 2200  piperacillin-tazobactam (ZOSYN) IVPB 3.375 g       3.375 g 12.5 mL/hr over 240 Minutes Intravenous 3 times per day 08/15/11 1758     08/15/11 1830   vancomycin (VANCOCIN) 1,250 mg in sodium chloride 0.9 % 250 mL IVPB        1,250 mg 166.7 mL/hr over 90 Minutes Intravenous Every 12 hours 08/15/11 1758     08/15/11 1535   gentamycin 80 mg in 0.9% normal saline 250 mL irrigation  Status:  Discontinued          As needed 08/15/11 1535 08/15/11 1608   08/15/11 1515   gentamycin 80 mg in 0.9% normal saline 250 mL irrigation  Status:  Discontinued        1 application Irrigation  Once 08/15/11 1507 08/15/11 1509   08/15/11 1515   gentamicin (GARAMYCIN) 80 mg in sodium chloride irrigation 0.9 % 500 mL irrigation         Irrigation Once 08/15/11 1510     08/14/11 1500   gentamicin (GARAMYCIN) 80 mg in sodium chloride irrigation 0.9 % 500 mL irrigation  Status:  Discontinued        80 mg Irrigation On call 08/14/11 1412 08/15/11 1819   08/14/11 1500   ceFAZolin (ANCEF) IVPB 2 g/50 mL premix        2 g 100 mL/hr over 30 Minutes Intravenous On call 08/14/11 1412 08/15/11 1430   08/13/11 1200   micafungin (MYCAMINE) 150 mg in sodium chloride 0.9 % 100 mL IVPB     Comments: Wait until fungal blood cultures are drawn before starting      150 mg 100 mL/hr over 1 Hours  Intravenous Daily 08/13/11 0948     08/13/11 1000   fluconazole (DIFLUCAN) tablet 100 mg  Status:  Discontinued        100 mg Oral Daily 08/12/11 1125 08/13/11 0948   08/12/11 1200   fluconazole (DIFLUCAN) tablet 200 mg        200 mg Oral Daily 08/12/11 1125 08/12/11 1426          Medications: Scheduled Meds:    . adenosine (ADENOCARD) IV  6 mg Intravenous Once  . adenosine      . antiseptic oral rinse  15 mL Mouth Rinse QID  . aspirin EC  81 mg Oral Daily  .  ceFAZolin (ANCEF) IV  2 g Intravenous On Call  . chlorhexidine  15 mL Mouth/Throat BID  . chlorhexidine      . enoxaparin  40 mg Subcutaneous Q24H  . etomidate      . feeding supplement  237 mL Oral BID BM  . fentaNYL      . fentaNYL  100 mcg Intravenous Once  . gentamicin irrigation   Irrigation Once  . metoprolol      . metoprolol  25 mg Oral BID  .  micafungin [facility]) IV  150 mg Intravenous Daily  . midazolam      . midazolam  2 mg Intravenous Once  . mulitivitamin with minerals  1 tablet Oral Daily  . piperacillin-tazobactam (ZOSYN)  IV  3.375 g Intravenous Q8H  . polyethylene glycol  17 g Oral Daily  . sodium chloride  1,000 mL Intravenous Once  . sodium chloride  1,000 mL Intravenous Once  . sodium chloride  1,000 mL Intravenous Once  . sodium chloride  1,000 mL Intravenous Once  . vancomycin  1,250 mg Intravenous Q12H  . DISCONTD: acetaminophen  1,000 mg Intravenous Once  . DISCONTD: calcium carbonate  1 tablet Oral TID  . DISCONTD: gentamicin irrigation  80 mg Irrigation On Call  . DISCONTD: gentamycin 80 mg in 0.9% normal saline 250 mL irrigation  1 application Irrigation Once  . DISCONTD: meperidine (DEMEROL) injection  25 mg Intravenous Once  . DISCONTD: pantoprazole  40 mg Oral Q1200  . DISCONTD: pantoprazole (PROTONIX) IV  40 mg Intravenous Daily  . DISCONTD: senna-docusate  1 tablet Oral BID  . DISCONTD: sodium chloride  3 mL Intravenous Q12H   Continuous Infusions:    . phenylephrine  (NEO-SYNEPHRINE) Adult infusion 100 mcg/min (08/16/11 0900)  . DISCONTD: sodium chloride 1,000 mL (08/15/11 0621)  . DISCONTD: sodium chloride 1,000 mL (08/15/11 0621)  . DISCONTD: dexmedetomidine (PRECEDEX) IV infusion Stopped (08/15/11 2146)  . DISCONTD: fentaNYL infusion INTRAVENOUS 25 mcg/hr (08/16/11 0800)  . DISCONTD: midazolam (VERSED) infusion 0.5 mg/hr (08/15/11 2346)  . DISCONTD: phenylephrine (NEO-SYNEPHRINE) Adult infusion 150 mcg/min (08/15/11 2327)   PRN Meds:.acetaminophen (TYLENOL) oral liquid 160 mg/5 mL, fentaNYL, metoprolol, ondansetron (ZOFRAN) IV, DISCONTD: acetaminophen, DISCONTD: acetaminophen, DISCONTD: acetaminophen, DISCONTD: acetaminophen, DISCONTD: diphenhydrAMINE, DISCONTD: docusate sodium, DISCONTD: fentaNYL, DISCONTD: fentaNYL, DISCONTD: gentamycin 80 mg in 0.9% normal saline 250 mL irrigation, DISCONTD:  HYDROmorphone (DILAUDID) injection, DISCONTD: lidocaine, DISCONTD: metoprolol DISCONTD: midazolam, DISCONTD:  morphine injection, DISCONTD: ondansetron (ZOFRAN) IV, DISCONTD: ondansetron (ZOFRAN) IV, DISCONTD: ondansetron, DISCONTD: sodium chloride, DISCONTD: traMADol   Objective: Weight change: 11 lb 8.6 oz (5.234 kg)  Intake/Output Summary (Last 24 hours) at 08/16/11 1031 Last data filed at 08/16/11 0800  Gross per 24 hour  Intake 6983.04 ml  Output    810 ml  Net 6173.04 ml   Blood pressure 107/90, pulse 87, temperature 98.7 F (37.1 C), temperature source Oral, resp. rate 22, height 5\' 10"  (1.778 m), weight 199 lb 15.3 oz (90.7 kg), SpO2 99.00%. Temp:  [97.5 F (36.4 C)-104 F (40 C)] 98.7 F (37.1 C) (05/17 0758) Pulse Rate:  [64-135] 87  (05/17 0900) Resp:  [0-36] 22  (05/17 0900) BP: (69-151)/(44-100) 107/90 mmHg (05/17 0900) SpO2:  [87 %-100 %] 99 % (05/17 0900) Arterial Line BP: (98-222)/(44-93) 99/44 mmHg (05/16 1735) FiO2 (%):  [30 %-100 %] 30 % (05/17 0816) Weight:  [199 lb 15.3 oz (90.7 kg)-200 lb 13.4 oz (91.1 kg)] 199 lb 15.3 oz (90.7  kg) (05/17 0500)  Physical Exam: General appearance: Intubated but alert following commands Resp: Fairly clear to auscultation bilaterally  Cardio: Tachycardic without obvious murmur GI: soft, non-tender; bowel sounds normal; no masses, no organomegaly  Extremities: extremities normal, atraumatic, no cyanosis or edema  Lab Results:  Basename 08/16/11 0445 08/15/11 1730  WBC 16.6* 7.4  HGB 9.4* 11.4*  HCT 29.3* 35.5*  PLT 130* 125*    BMET  Basename 08/16/11 0445 08/15/11 1730  NA 133* 132*  K 4.9 5.1  CL 103 95*  CO2 20 22  GLUCOSE 133* 195*  BUN 21 17  CREATININE 1.59* 1.22  CALCIUM 7.4* 8.8    Micro Results: Recent Results (from the past 240 hour(s))  CULTURE, BLOOD (ROUTINE X 2)     Status: Normal   Collection Time   08/08/11  4:45 PM      Component Value Range Status Comment   Specimen Description BLOOD LEFT ARM   Final    Special Requests BOTTLES DRAWN AEROBIC AND ANAEROBIC 10CC   Final    Culture  Setup Time [id number]   Final    Culture NO GROWTH 5 DAYS   Final    Report Status 08/15/2011 FINAL   Final   CULTURE, BLOOD (ROUTINE X 2)     Status: Normal   Collection Time   08/08/11  4:45 PM      Component Value Range Status Comment   Specimen Description BLOOD LEFT ARM   Final    Special Requests BOTTLES DRAWN AEROBIC ONLY 3CC   Final    Culture  Setup Time [id number]   Final    Culture NO GROWTH 5 DAYS   Final    Report Status 08/15/2011 FINAL   Final   MRSA PCR SCREENING     Status: Normal   Collection Time   08/08/11  9:28 PM      Component Value Range Status Comment   MRSA by PCR NEGATIVE  NEGATIVE  Final   AFB CULTURE, BLOOD     Status: Normal (Preliminary result)   Collection Time   08/09/11  9:00 PM      Component Value Range Status Comment   Specimen Description BLOOD ARM RIGHT   Final    Special Requests 5CC AFB   Final    Culture     Final    Value: CULTURE WILL BE EXAMINED FOR 6 WEEKS BEFORE ISSUING A FINAL REPORT   Report Status PENDING    Incomplete   CULTURE, BLOOD (ROUTINE X 2)     Status: Normal (Preliminary result)   Collection Time   08/12/11  6:00 PM      Component Value Range Status Comment   Specimen Description BLOOD ARM RIGHT   Final    Special Requests BOTTLES DRAWN AEROBIC ONLY 6CC   Final    Culture  Setup Time [id number]   Final    Culture     Final    Value:        BLOOD CULTURE RECEIVED NO GROWTH TO DATE CULTURE WILL BE HELD FOR 5 DAYS BEFORE ISSUING A FINAL NEGATIVE REPORT   Report Status PENDING   Incomplete   FUNGUS CULTURE, BLOOD     Status: Normal (Preliminary result)   Collection Time   08/12/11  6:00 PM      Component Value Range Status Comment   Specimen Description BLOOD RIGHT ARM   Final    Special Requests BOTTLES DRAWN AEROBIC ONLY 6CC   Final    Culture NO FUNGUS ISOLATED;CULTURE IN PROGRESS FOR 7 DAYS   Final    Report Status PENDING   Incomplete   CULTURE, BLOOD (ROUTINE X 2)     Status: Normal (Preliminary result)   Collection Time   08/12/11  6:15 PM      Component Value Range Status Comment   Specimen Description BLOOD ARM LEFT   Final    Special Requests BOTTLES DRAWN AEROBIC AND ANAEROBIC 10CC   Final    Culture  Setup Time [id number]   Final  Culture     Final    Value:        BLOOD CULTURE RECEIVED NO GROWTH TO DATE CULTURE WILL BE HELD FOR 5 DAYS BEFORE ISSUING A FINAL NEGATIVE REPORT   Report Status PENDING   Incomplete   CULTURE, BLOOD (ROUTINE X 2)     Status: Normal   Collection Time   08/13/11 12:40 AM      Component Value Range Status Comment   Specimen Description BLOOD RIGHT ARM   Final    Special Requests BOTTLES DRAWN AEROBIC AND ANAEROBIC 5CC   Final    Culture  Setup Time [id number]   Final    Culture     Final    Value: STAPHYLOCOCCUS SPECIES (COAGULASE NEGATIVE)     Note: THE SIGNIFICANCE OF ISOLATING THIS ORGANISM FROM A SINGLE SET OF BLOOD CULTURES WHEN MULTIPLE SETS ARE DRAWN IS UNCERTAIN. PLEASE NOTIFY THE MICROBIOLOGY DEPARTMENT WITHIN ONE WEEK IF  SPECIATION AND SENSITIVITIES ARE REQUIRED.     Note: Gram Stain Report Called to,Read Back By and Verified With: NADINE WELLINGTON 08/14/2011 7:51AM YIMSU   Report Status 08/15/2011 FINAL   Final   CULTURE, BLOOD (ROUTINE X 2)     Status: Normal (Preliminary result)   Collection Time   08/13/11 12:43 AM      Component Value Range Status Comment   Specimen Description BLOOD RIGHT HAND   Final    Special Requests BOTTLES DRAWN AEROBIC AND ANAEROBIC 5CC   Final    Culture  Setup Time [id number]   Final    Culture     Final    Value:        BLOOD CULTURE RECEIVED NO GROWTH TO DATE CULTURE WILL BE HELD FOR 5 DAYS BEFORE ISSUING A FINAL NEGATIVE REPORT   Report Status PENDING   Incomplete   AFB CULTURE, BLOOD     Status: Normal (Preliminary result)   Collection Time   08/13/11 12:45 AM      Component Value Range Status Comment   Specimen Description BLOOD RIGHT HAND   Final    Special Requests 5CC   Final    Culture     Final    Value: CULTURE WILL BE EXAMINED FOR 6 WEEKS BEFORE ISSUING A FINAL REPORT   Report Status PENDING   Incomplete   WOUND CULTURE     Status: Normal (Preliminary result)   Collection Time   08/15/11  4:08 PM      Component Value Range Status Comment   Specimen Description WOUND   Final    Special Requests INFECTED PACING LEAD   Final    Gram Stain     Final    Value: NO WBC SEEN     NO SQUAMOUS EPITHELIAL CELLS SEEN     NO ORGANISMS SEEN   Culture NO GROWTH   Final    Report Status PENDING   Incomplete   WOUND CULTURE     Status: Normal (Preliminary result)   Collection Time   08/15/11  4:10 PM      Component Value Range Status Comment   Specimen Description WOUND   Final    Special Requests ICD DEVICE POCKET SWAB REC'D   Final    Gram Stain     Final    Value: NO WBC SEEN     NO SQUAMOUS EPITHELIAL CELLS SEEN     NO ORGANISMS SEEN   Culture NO GROWTH   Final    Report Status PENDING  Incomplete   FUNGUS CULTURE W SMEAR     Status: Normal (Preliminary  result)   Collection Time   08/15/11  4:10 PM      Component Value Range Status Comment   Specimen Description WOUND   Final    Special Requests ICD DEVICE POCKET SWAB REC'D   Final    Fungal Smear PENDING   Incomplete    Culture PENDING   Incomplete    Report Status PENDING   Incomplete     Studies/Results: Portable Chest Xray In Am  08/16/2011  *RADIOLOGY REPORT*  Clinical Data: Evaluate lung fields and endotracheal tube position  PORTABLE CHEST - 1 VIEW  Comparison: Chest radiograph 08/15/2011  Findings: Endotracheal tube and NG tube unchanged.  Sternal wires overlie stable cardiac silhouette.  There is an increase in central venous congestion compared to prior.  Mild basilar atelectasis.  No pneumothorax.  IMPRESSION:  1.  Stable support apparatus. 2.  Increase in central venous pulmonary congestion.  Original Report Authenticated By: Genevive Bi, M.D.   Dg Chest Port 1 View  08/15/2011  *RADIOLOGY REPORT*  Clinical Data: Shortness of breath.  Cough.  Fever.  PORTABLE CHEST - 1 VIEW  Comparison: 08/08/2011  Findings: Stable appearance of postoperative changes in the mediastinum.  The cardiac pacemaker has been removed since the previous study.  Cardiac enlargement with increased pulmonary vascularity suggesting vascular congestion.  No significant edema or consolidation.  No blunting of costophrenic angles.  No pneumothorax.  IMPRESSION: Interval removal of cardiac pacemaker.  Cardiac enlargement with pulmonary vascular congestion.  No edema or consolidation.  Original Report Authenticated By: Marlon Pel, M.D.   Portable Chest Xray  08/15/2011  *RADIOLOGY REPORT*  Clinical Data: Endotracheal tube placement.  PORTABLE CHEST - 1 VIEW  Comparison: 08/15/2011  Findings: Endotracheal tube is 3.7 cm above the carina. Nasogastric tube enters the stomach.  Prior CABG noted.  Mild cardiomegaly is present.  Mild vascular indistinctness noted without overt edema.  IMPRESSION:  1.  Endotracheal  tube is satisfactorily positioned with tip 3.7 cm above the carina. 2.  Mild cardiomegaly, with borderline appearance for pulmonary venous hypertension.  No overt edema.  Original Report Authenticated By: Dellia Cloud, M.D.   Dg C-arm 1-60 Min-no Report  08/15/2011  CLINICAL DATA: ICD extraction   C-ARM 1-60 MINUTES  Fluoroscopy was utilized by the requesting physician.  No radiographic  interpretation.        Assessment/Plan: [name] is a 66 y.o. 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 --Agree with "sepsis" protocol antibiotics in the form of vancomycin and Zosyn for now. --Do not were isolated organism he will need antibacterial therapy for a "culture negative endocarditis." I am not especially eager to give him protracted aminoglycosides, or fluoroquinolones though they're both often included such a regimen. Certainly I think he will need vancomycin and a beta-lactam such as Rocephin if we fail to isolate an organism. Fungal infection of the lead is not terribly likely but again we'll continue the micafungin for now consider possibly including a regimen given the Candida esophagitis and that given there was a correlation with esophageal dilatation and his having fevers  2) esophagitis: find to continue the micafungin  Dr. Drue Second will be covering this weekend .    LOS: 8  days   Acey Lav 08/16/2011, 10:31 AM

## 2011-08-16 NOTE — Care Management Note (Signed)
    Page 1 of 2   08/21/2011     12:38:40 PM   CARE MANAGEMENT NOTE 08/21/2011  Patient:  [name], [name]   Account Number:  [id number]  Date Initiated:  08/16/2011  Documentation initiated by:  Ronny Flurry  Subjective/Objective Assessment:   DX: ICD lead endocarditis.    08-15-11 intubated , Biventricular implantable cardioverter-  defibrillator system extraction     Action/Plan:   probable need for long term antibiotics   Anticipated DC Date:  08/21/2011   Anticipated DC Plan:  HOME W HOME HEALTH SERVICES      DC Planning Services  CM consult      [facility] Choice  HOME HEALTH   Choice offered to / List presented to:  C-3 Spouse   DME arranged  Levan Hurst      DME agency  [facility]     [facility] arranged  HH-1 RN  HH-10 DISEASE MANAGEMENT  HH-2 PT      HH agency  [facility]   Status of service:  Completed, signed off Medicare Important Message given?   (If response is "NO", the following Medicare IM given date fields will be blank) Date Medicare IM given:   Date Additional Medicare IM given:    Discharge Disposition:    Per UR Regulation:  Reviewed for med. necessity/level of care/duration of stay  If discussed at Long Length of Stay Meetings, dates discussed:   08/21/2011    Comments:  08-21-11 1236 [name], RN, BSN [phone] CM did receive orders for [facility] services. CM made referral for services with [facility] and SOC to begin 24-48 hours post d/c. CM did order dme listed above and Derrian will deliver to room before d/c. No further services needed from CM at this time.  08-21-11 1115 [name], RN,BSN [phone] Pt was discussed in Long length of stay meeting today.  08-20-11 1651 [name], [phone] Pt is from home with wife. PT to consult for disposition needs. Pt may benefit from  CIR vs SNF- question wheter wife will be able to care for pt at d/c. CM did speak to MD. Will continue to  monitor for disposition needs.

## 2011-08-16 NOTE — Op Note (Signed)
NAMEUGO, THOMA NO.:  [id number]  MEDICAL RECORD NO.:  [id number]  LOCATION:  2105                         FACILITY:  [facility]  PHYSICIAN:  Doylene Canning. Ladona Ridgel, MD    DATE OF BIRTH:  [birth year]  DATE OF PROCEDURE:  08/15/2011 DATE OF DISCHARGE:                              OPERATIVE REPORT   SURGEON:  Doylene Canning. Ladona Ridgel, MD  PROCEDURE PERFORMED:  Biventricular implantable cardioverter- defibrillator system extraction.  INDICATION:  ICD lead endocarditis.  INTRODUCTION:  The patient is a 66 year old man with a history of an ischemic cardiomyopathy, left bundle-branch block, chronic systolic heart failure, who had BiV ICD insertion 4 years ago.  The patient subsequently began developed fevers, chills, and weight loss 6 months ago.  He has had multiple blood cultures which were negative.  A transesophageal echo demonstrated a 2-cm vegetation on the pacing lead and he is now referred for extraction.  DESCRIPTION OF PROCEDURE:  After informed consent was obtained, the patient was taken to the operating room in a fasting state.  Arterial monitoring was placed and the patient was sedated with general anesthesia.  A 7-cm incision was carried out over the ICD system, and electrocautery was utilized to dissect down to the ICD pocket through which the generator was removed with gentle traction.  The leads were disconnected from the generator, and electrocautery was utilized to free up the ICD leads.  The 0.014 guidewire was advanced into the left ventricular lead, and with a combination of gentle traction, the LV lead was removed in total without difficulty.  Next, attention was then turned to the atrial lead.  The 52-cm stylet was advanced into the atrial lead and the helix was retracted.  Gentle traction was again utilized and the atrial lead pulled out with firm but constant force. In the same way, the 65-cm stylet was advanced into the right ventricular lead.  The  helix was retracted.  Gentle traction failed to move the lead.  A liberator locking stylet was advanced into the lead and secured.  Again, gentle traction failed to move the lead.  The 11- [place] electrosurgical dissection sheath was advanced over the lead into the subclavian vein.  Next, the [facility] sheath was removed and the [employer] EDS 13-French sheath was advanced over the defibrillator lead and utilizing a combination of pressure and counter pressure traction and counter traction, the lead was removed in total.  The portion of leads were all sent for culture.  There was no active vegetation seen on the lead.  Approximately 3 minutes after removal of the defibrillator lead, the patient developed transient hypotension with blood pressures in the 60s.  This persisted for approximately 2 minutes before resolving.  A sheath was placed in the left femoral vein but was not used for additional cardiovascular access.  The patient was returned to the recovery area in satisfactory condition.  COMPLICATIONS:  There were no immediate procedure complications except for transient hypotension.  CONCLUSIONS:  This study demonstrates successful extraction of a BiV ICD system in a patient with likely endocarditis, questionable fungal without immediate procedure complication.     Doylene Canning. Ladona Ridgel, MD     GWT/MEDQ  D:  08/15/2011  T:  08/16/2011  Job:  [id number]  cc:   Vesta Mixer, M.D.

## 2011-08-17 ENCOUNTER — Inpatient Hospital Stay (HOSPITAL_COMMUNITY): Payer: Medicare Other

## 2011-08-17 DIAGNOSIS — I39 Endocarditis and heart valve disorders in diseases classified elsewhere: Secondary | ICD-10-CM | POA: Insufficient documentation

## 2011-08-17 DIAGNOSIS — I339 Acute and subacute endocarditis, unspecified: Secondary | ICD-10-CM | POA: Insufficient documentation

## 2011-08-17 LAB — BASIC METABOLIC PANEL
CO2: 23 mEq/L (ref 19–32)
Calcium: 7.7 mg/dL — ABNORMAL LOW (ref 8.4–10.5)
Creatinine, Ser: 1.08 mg/dL (ref 0.50–1.35)
GFR calc non Af Amer: 70 mL/min — ABNORMAL LOW (ref >90)

## 2011-08-17 LAB — CBC
MCV: 83.5 fL (ref 78.0–100.0)
Platelets: 100 10*3/uL — ABNORMAL LOW (ref 150–400)
RDW: 16.3 % — ABNORMAL HIGH (ref 11.5–15.5)
WBC: 8.1 10*3/uL (ref 4.0–10.5)

## 2011-08-17 LAB — PRO B NATRIURETIC PEPTIDE: Pro B Natriuretic peptide (BNP): 4005 pg/mL — ABNORMAL HIGH (ref 0–125)

## 2011-08-17 MED ORDER — WHITE PETROLATUM GEL
Status: AC
  Administered 2011-08-17: 17:00:00
  Filled 2011-08-17: qty 5

## 2011-08-17 NOTE — Progress Notes (Signed)
Patient ID: [name], male   DOB: 01-26-1946, 66 y.o.   MRN: [id number] Subjective:  Extubated, off of pressors, feeling better. No chest pain or dyspnea. Note fevers last night.  Objective:  Vital Signs in the last 24 hours: Temp:  [98 F (36.7 C)-103.5 F (39.7 C)] 98.9 F (37.2 C) (05/18 0403) Pulse Rate:  [83-119] 96  (05/18 0400) Resp:  [21-40] 27  (05/18 0400) BP: (82-115)/(45-90) 86/52 mmHg (05/18 0400) SpO2:  [89 %-100 %] 95 % (05/18 0400) FiO2 (%):  [30 %] 30 % (05/17 0816) Weight:  [201 lb 11.5 oz (91.5 kg)] 201 lb 11.5 oz (91.5 kg) (05/18 0500)  Intake/Output from previous day: 05/17 0701 - 05/18 0700 In: 1722.3 [P.O.:680; I.V.:687.8; IV Piggyback:354.5] Out: 1185 [Urine:1185] Intake/Output from this shift:    Physical Exam: Diskempt appearing NAD HEENT: Unremarkable Neck:  No JVD, no thyromegally Lungs:  Clear except for scatter rales. No increased work of breathing. No wheezes or rhonchi. HEART:  Regular rate rhythm, no murmurs, no rubs, no clicks Abd:  Flat, positive bowel sounds, no organomegally, no rebound, no guarding Ext:  2 plus pulses, no edema, no cyanosis, no clubbing Skin:  No rashes no nodules Neuro:  CN II through XII intact, motor grossly intact  Lab Results:  Basename 08/17/11 0415 08/16/11 0445  WBC 8.1 16.6*  HGB 8.3* 9.4*  PLT 100* 130*    Basename 08/17/11 0415 08/16/11 0445  NA 131* 133*  K 4.3 4.9  CL 100 103  CO2 23 20  GLUCOSE 105* 133*  BUN 20 21  CREATININE 1.08 1.59*   No results found for this basename: TROPONINI:2,CK,MB:2 in the last 72 hours Hepatic Function Panel  Basename 08/16/11 0445  PROT 6.0  ALBUMIN 2.1*  AST 26  ALT 12  ALKPHOS 40  BILITOT 0.5  BILIDIR --  IBILI --   No results found for this basename: CHOL in the last 72 hours No results found for this basename: PROTIME in the last 72 hours  Imaging: Portable Chest Xray In Am  08/16/2011  *RADIOLOGY REPORT*  Clinical Data: Evaluate lung  fields and endotracheal tube position  PORTABLE CHEST - 1 VIEW  Comparison: Chest radiograph 08/15/2011  Findings: Endotracheal tube and NG tube unchanged.  Sternal wires overlie stable cardiac silhouette.  There is an increase in central venous congestion compared to prior.  Mild basilar atelectasis.  No pneumothorax.  IMPRESSION:  1.  Stable support apparatus. 2.  Increase in central venous pulmonary congestion.  Original Report Authenticated By: Genevive Bi, M.D.   Dg Chest Port 1 View  08/15/2011  *RADIOLOGY REPORT*  Clinical Data: Shortness of breath.  Cough.  Fever.  PORTABLE CHEST - 1 VIEW  Comparison: 08/08/2011  Findings: Stable appearance of postoperative changes in the mediastinum.  The cardiac pacemaker has been removed since the previous study.  Cardiac enlargement with increased pulmonary vascularity suggesting vascular congestion.  No significant edema or consolidation.  No blunting of costophrenic angles.  No pneumothorax.  IMPRESSION: Interval removal of cardiac pacemaker.  Cardiac enlargement with pulmonary vascular congestion.  No edema or consolidation.  Original Report Authenticated By: Marlon Pel, M.D.   Portable Chest Xray  08/15/2011  *RADIOLOGY REPORT*  Clinical Data: Endotracheal tube placement.  PORTABLE CHEST - 1 VIEW  Comparison: 08/15/2011  Findings: Endotracheal tube is 3.7 cm above the carina. Nasogastric tube enters the stomach.  Prior CABG noted.  Mild cardiomegaly is present.  Mild vascular indistinctness noted without overt edema.  IMPRESSION:  1.  Endotracheal tube is satisfactorily positioned with tip 3.7 cm above the carina. 2.  Mild cardiomegaly, with borderline appearance for pulmonary venous hypertension.  No overt edema.  Original Report Authenticated By: Dellia Cloud, M.D.   Dg C-arm 1-60 Min-no Report  08/15/2011  CLINICAL DATA: ICD extraction   C-ARM 1-60 MINUTES  Fluoroscopy was utilized by the requesting physician.  No radiographic   interpretation.      Cardiac Studies: Tele - NSR Assessment/Plan:  1. ICD lead endocarditis 2. S/p ICD system extraction 3. VDRF secondary to #2.  4. ICM/Chronic systolic CHF 5. Septic shock secondary to #1 Rec: he is much improved. Continue supportive care including broad spectrum anti-biotics. Ok from my perspective to transfer to tele bed. He would not be a candidate for any additional implantable cardiac device until IV anti-biotic infusion complete and esophagus free of thrush, and serial cultures off of anti-biotics are sterile. I will review ICD therapy history. If none, I would not recommend a Life Vest at this time.  Lewayne Bunting, M.D.  LOS: 9 days    Lewayne Bunting 08/17/2011, 7:47 AM

## 2011-08-17 NOTE — Progress Notes (Signed)
Name: [name] MRN: [id number] DOB: [birth year]    LOS: 9  [facility]  Pt Profile:  66 yo male with hx CAD s/p CABG, ICD placement approx 4 years ago, CHF (EF 25-30%) admitted 5/9 with persistent fevers up to 104.4 due to vegetations on ICD lead.  ICD removed 5/16. Resp distress requiring intubation 5/16 PM. Extubated 5/17 AM  Lines / Drains: R rad aline 5/16 >> 5/17 ETT 5/16 >> 5/17 L femoral cordis 5/16 >>5/18  Cultures: Wound (ICD pocket) 5/16>>> BCx2 5/14>>> 1/2 coag neg staph >>   BC X 2 5/17 >>   Antibiotics: micafungin 5/1 >> Vanc 5/16 >> Zosyn 5/16 >>  Tests / Events: 5/13 Echo: LVEF 25% - 30%. Mild MR. LA mildly dilated. Large mass noted on right atrial lead  5/16 ICD removed 5/16 Echo: moderate LVH.  EF was 25%. Diffuse hypokinesis  Subj: no distress, extubated     Vital Signs: Temp:  [98.3 F (36.8 C)-103.5 F (39.7 C)] 98.3 F (36.8 C) (05/18 0753) Pulse Rate:  [87-119] 100  (05/18 1000) Resp:  [23-40] 26  (05/18 1000) BP: (86-115)/(45-75) 103/61 mmHg (05/18 1000) SpO2:  [89 %-99 %] 97 % (05/18 1000) Weight:  [91.5 kg (201 lb 11.5 oz)] 91.5 kg (201 lb 11.5 oz) (05/18 0500) I/O last 3 completed shifts: In: 6997.9 [P.O.:680; I.V.:1481.4; NG/GT:120; IV Piggyback:4716.5] Out: 1620 [Urine:1620]  Physical Examination:  General: chronically ill appearing, NAD HEENT: no JVD  Neuro: alert, calm  Lungs: cta Cards: s1s2 rrr, no m/r/g Abd: soft, round, non tender, +bs Ext: warm and dry, no edema    Labs and Imaging:   CXR: NSC  CBC    Component Value Date/Time   WBC 8.1 08/17/2011 0415   RBC 3.09* 08/17/2011 0415   HGB 8.3* 08/17/2011 0415   HCT 25.8* 08/17/2011 0415   PLT 100* 08/17/2011 0415   MCV 83.5 08/17/2011 0415   MCH 26.9 08/17/2011 0415   MCHC 32.2 08/17/2011 0415   RDW 16.3* 08/17/2011 0415   LYMPHSABS 2.1 08/13/2011 0540   MONOABS 0.6 08/13/2011 0540   EOSABS 0.1 08/13/2011 0540   BASOSABS 0.1 08/13/2011 0540    BMET      Component Value Date/Time   NA 131* 08/17/2011 0415   K 4.3 08/17/2011 0415   CL 100 08/17/2011 0415   CO2 23 08/17/2011 0415   GLUCOSE 105* 08/17/2011 0415   BUN 20 08/17/2011 0415   CREATININE 1.08 08/17/2011 0415   CREATININE 1.10 07/29/2011 1711   CALCIUM 7.7* 08/17/2011 0415   GFRNONAA 70* 08/17/2011 0415   GFRAA 81* 08/17/2011 0415     ABG    Component Value Date/Time   PHART 7.409 08/15/2011 2048   PCO2ART 44.2 08/15/2011 2048   PO2ART 385.0* 08/15/2011 2048   HCO3 27.3* 08/15/2011 2048   TCO2 28 08/15/2011 2048   ACIDBASEDEF 5.0* 08/15/2011 1827   O2SAT 100.0 08/15/2011 2048      Assessment and Plan: Acute respiratory failure  Endocarditis -- s/p lead extraction   Septic shock - r/t endocarditis  Anemia  Ischemic caridomyopathy - per cards  Hyponatremia  Esophagitis   PLAN -  No fevers today, continue abx per ID Extubated no distress, IS To tel floor today Dc fem line Dc foley Follow up RT base infiltrate / atrx Add is Chem in am  kvo  To traid, will sign off, call if needed  Mcarthur Rossetti. Tyson Alias, MD, FACP Pgr: [phone] [facility]

## 2011-08-17 NOTE — Progress Notes (Signed)
Progress Note  Currently, the patient states he continues to have diffuse body pain, cannot specify specific site of body involved is anxious about this, as these symptoms typically accompanied his prior fevers. Having improved swallowing.   Interval Events: - 05/16 AM - patient BiV ICD removal extraction via teh left subclavian vein complicated by transient hypotension. Post-procedurally, patient had fevers to 103F, he also developed acute respiratory failure with desaturations to 70s (per RN report), thereby requiring endotracheal intubation on 05/16. He was also started on Vancomycin and Zosyn for empiric broad spectrum antibiotic coverage.  - 05/16 PM - Patient also developed severe hypotension on precedex drip, as well, became tachycardic with rates to 170s --> requiring initiation of neosynephrine drip. - 05/17 - Extubated, off of pressors. - 05/18 - Transfer out of ICU pending.    Internal Medicine Teaching Service will plan to resume care of this patient once transferred out of the ICU.  Appreciate all consultants time and extensive efforts in the care of our patient.     Johnette Abraham, Norman Herrlich, Internal Medicine Resident 08/17/2011, 3:20 PM

## 2011-08-17 NOTE — Progress Notes (Signed)
INFECTIOUS DISEASE PROGRESS NOTE  ID: [name] is a 66 y.o. 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 Cultures/stains show hyphae/c.albicans, although no evidence of candidemia. Currently on micafungin, vanco and piptazo  Subjective: Extubated over the last 24hrs and off of pressors. No fevers since 2000, last night, but Tmax 103.5 and still receiving acetominophen. Patient states he feels poorly, achy all over, and very tired. He just finished bedside bath which has wiped him out.  Fever curve= persistently febrile 101 since 5/10, but > 101 in last 36 hrs since device+ lead extraction  Abtx:  vanco 5/16 Piptazo 5/16 Mica 5/10  Medications:     . antiseptic oral rinse  15 mL Mouth Rinse QID  . aspirin EC  81 mg Oral Daily  . chlorhexidine  15 mL Mouth/Throat BID  . enoxaparin  40 mg Subcutaneous Q24H  . feeding supplement  237 mL Oral BID BM  . gentamicin irrigation   Irrigation Once  . metoprolol  25 mg Oral BID  . micafungin [facility]) IV  150 mg Intravenous Daily  . mulitivitamin with minerals  1 tablet Oral Daily  . piperacillin-tazobactam (ZOSYN)  IV  3.375 g Intravenous Q8H  . polyethylene glycol  17 g Oral Daily  . vancomycin  1,250 mg Intravenous Q12H    Objective: Vital signs in last 24 hours: Temp:  [98 F (36.7 C)-103.5 F (39.7 C)] 98.3 F (36.8 C) (05/18 0753) Pulse Rate:  [85-119] 100  (05/18 1000) Resp:  [23-40] 26  (05/18 1000) BP: (82-115)/(45-75) 103/61 mmHg (05/18 1000) SpO2:  [89 %-99 %] 97 % (05/18 1000) Weight:  [201 lb 11.5 oz (91.5 kg)] 201 lb 11.5 oz (91.5 kg) (05/18 0500) Physical Exam  Constitutional: He is oriented to person, place, and time. He appears well-developed and well-nourished. No distress.  HENT:  Mouth/Throat: Oropharynx is clear and  moist. No oropharyngeal exudate.  Cardiovascular: tachycardic, regular rhythm and normal heart sounds. Exam reveals no gallop and no friction rub.  No murmur heard.  Chest wall= left upper incision, is clean dry and intact, Pulmonary/Chest: tachypnic and breath sounds normal. No respiratory distress. He has no wheezes.  Abdominal: Soft. Bowel sounds are normal. He exhibits no distension. There is no tenderness.  Lymphadenopathy:  He has no cervical adenopathy.  Neurological: He is alert and oriented to person, place, and time.  Skin: Skin is warm and midly diaphoretic Psychiatric: He has a normal mood and affect. His behavior is normal.    Lab Results  [facility] 08/17/11 0415 08/16/11 0445  WBC 8.1 16.6*  HGB 8.3* 9.4*  HCT 25.8* 29.3*  NA 131* 133*  K 4.3 4.9  CL 100 103  CO2 23 20  BUN 20 21  CREATININE 1.08 1.59*  GLU -- --   Liver Panel  Basename 08/16/11 0445  PROT 6.0  ALBUMIN 2.1*  AST 26  ALT 12  ALKPHOS 40  BILITOT 0.5  BILIDIR --  IBILI --   Microbiology:  Assessment/Plan: Candidal cardiac device infective endocarditis, blood cx negative, hyphae/c.albicans found on lead vegetation is thought to be rare. Fungal IE is estimated as 1% of all IE cases, and even smaller percentage when considering cardiac device endocarditis. Nonetheless, still has high mortality rate of 30-40% based on retrospective literature.   Presumably the pathogenesis of his cardiac device endocarditis  is that he may have had EGD dilatation instrumentation in setting of candidal esophagitis which may have transiently caused him to be candidemic many months ago to cause his current scenario. It is unusual that we have not isolated candida from his blood culture. A retrospective study found 1 in 15 cases of fungal cardiac device IE that did not have + blood cx.(halawa et al. Mycoses 2011: vol 54) The lack of positive blood cultures would also explain why he was not severely ill.  1) Treatment  for c.albicans cardiac device endocarditis =  continue with micafungin, will need 6-8wks of IV therapy, then transition to oral suppression with fluconazole.  The patient has been on micafungin for 7 days and has not clinically improved, still febrile, but that is thought to be due to his device still being in place and not explanted  2) low threshold to look for embolic phenomenon post lead extraction since it is noted that amongst candidal endocarditis up to 1/3rd may have major fungal embolic event.   --  consider getting ophtho to eval for candidal endo-ophthalmitis, since it would change his therapy. --  get a surveillance TTE in 5 days, to evaluate tricuspid valve to see if any evidence of new valve abnormality -- if CXR suggests that there are new multi lobar infiltrates, would think this is due to embolic phenomenon, and consider getting CTA for further evaluation  3) response to therapy = please get a baseline B-D glucan/fungitell assay so that we can repeat in 4-6 wks to see that it is trending down appropriately.  4) If cultures do not grow from lead extraction, would recommend to send for PCR testing to ensure it is c.albicans and not other non albicans candidal species. Will check with micro what stains they performed to identify c.albicans  5) empiric antibiotics of vanco and piptazo = await until cultures from 5/16 finalize and still stay negative after 72hrs before discontinuing, to ensure we have not missed other bacterial infection associated with sepsis event after extraction.  Duke Salvia Drue Second MD MPH [facility] [phone]   Judyann Munson Infectious Diseases 08/17/2011, 10:53 AM

## 2011-08-18 ENCOUNTER — Inpatient Hospital Stay (HOSPITAL_COMMUNITY): Payer: Medicare Other

## 2011-08-18 LAB — TYPE AND SCREEN
Antibody Screen: NEGATIVE
Unit division: 0

## 2011-08-18 LAB — WOUND CULTURE: Gram Stain: NONE SEEN

## 2011-08-18 LAB — BASIC METABOLIC PANEL
CO2: 23 mEq/L (ref 19–32)
Calcium: 8.5 mg/dL (ref 8.4–10.5)
Creatinine, Ser: 0.75 mg/dL (ref 0.50–1.35)
Glucose, Bld: 110 mg/dL — ABNORMAL HIGH (ref 70–99)

## 2011-08-18 LAB — CBC
MCH: 26.5 pg (ref 26.0–34.0)
MCV: 82.5 fL (ref 78.0–100.0)
Platelets: 125 10*3/uL — ABNORMAL LOW (ref 150–400)
RDW: 16.1 % — ABNORMAL HIGH (ref 11.5–15.5)
WBC: 10.6 10*3/uL — ABNORMAL HIGH (ref 4.0–10.5)

## 2011-08-18 MED ORDER — IOHEXOL 350 MG/ML SOLN
80.0000 mL | Freq: Once | INTRAVENOUS | Status: AC | PRN
  Administered 2011-08-18: 80 mL via INTRAVENOUS

## 2011-08-18 MED ORDER — ZOLPIDEM TARTRATE 5 MG PO TABS
5.0000 mg | ORAL_TABLET | Freq: Every evening | ORAL | Status: DC | PRN
  Administered 2011-08-18: 5 mg via ORAL
  Filled 2011-08-18: qty 1

## 2011-08-18 MED ORDER — OXYCODONE-ACETAMINOPHEN 5-325 MG PO TABS
2.0000 | ORAL_TABLET | Freq: Four times a day (QID) | ORAL | Status: DC | PRN
  Administered 2011-08-18 – 2011-08-20 (×5): 2 via ORAL
  Filled 2011-08-18 (×6): qty 2

## 2011-08-18 NOTE — Progress Notes (Signed)
[name] 3:07 PM  Subjective: Patient is doing fine from the swallowing and GI standpoint and has no specific complaint  Objective: Vital signs stable still having some fevers no chest wall tenderness or abdominal pain on palpation hemoglobin and white count stable  Assessment: Dysphasia helped with dilation  Plan: Please let us know if we can help during this hospital stay otherwise he will followup with my partner Dr. Madilyn Fireman as an outpatient and continue pump inhibitor and all of the above was discussed with the patient and his family  [facility]

## 2011-08-18 NOTE — Progress Notes (Addendum)
Subjective:    Currently, the patient states that he is feeling much better than yesterday. States his fevers have not been getting as high. The Percocet that was recently started is much better at controlling his pain. Continues to have sweating episodes..  Interval Events: - Transferred from ICU to telemetry bed.    Objective:    Vital Signs:   Temp:  [98 F (36.7 C)-102 F (38.9 C)] 98.4 F (36.9 C) (05/19 1400) Pulse Rate:  [81-100] 91  (05/19 1400) Resp:  [20-34] 20  (05/19 1400) BP: (108-147)/(60-78) 121/71 mmHg (05/19 1400) SpO2:  [97 %-99 %] 99 % (05/19 1400) Last BM Date: 08/18/11  24-hour weight change: Weight change:   Intake/Output:   Intake/Output Summary (Last 24 hours) at 08/18/11 1802 Last data filed at 08/18/11 1400  Gross per 24 hour  Intake    140 ml  Output   1126 ml  Net   -986 ml      Physical Exam: General: Vital signs reviewed and noted. Well-developed, well-nourished, in no acute distress; alert, appropriate and cooperative throughout examination.  Lungs:  Normal respiratory effort. Clear to auscultation BL without crackles or wheezes.  Heart: RRR. S1 and S2 normal without gallop, murmur, or rubs.  Abdomen:  BS normoactive. Soft, Nondistended, non-tender.  No masses or organomegaly.  Extremities: No pretibial edema.     Labs:  Basic Metabolic Panel:  Lab 08/18/11 9147 08/17/11 0415 08/16/11 0445 08/15/11 1730 08/14/11 0823  NA 132* 131* 133* 132* 132*  K 4.1 4.3 4.9 5.1 4.4  CL 99 100 103 95* 96  CO2 23 23 20 22 24   GLUCOSE 110* 105* 133* 195* 135*  BUN 12 20 21 17 13   CREATININE 0.75 1.08 1.59* 1.22 1.07  CALCIUM 8.5 7.7* 7.4* -- --  MG -- -- 1.6 2.0 --  PHOS -- -- 3.1 5.5* --    Liver Function Tests:  Lab 08/16/11 0445  AST 26  ALT 12  ALKPHOS 40  BILITOT 0.5  PROT 6.0  ALBUMIN 2.1*    CBC:  Lab 08/18/11 0515 08/17/11 0415 08/16/11 0445 08/15/11 1730 08/14/11 0823 08/13/11 0540  WBC 10.6* 8.1 16.6* 7.4 6.4 --    NEUTROABS -- -- -- -- -- 4.2  HGB 9.1* 8.3* 9.4* 11.4* 12.2* --  HCT 28.3* 25.8* 29.3* 35.5* 37.5* --  MCV 82.5 83.5 83.0 83.9 83.1 --  PLT 125* 100* 130* 125* 147* --    CBG:  Lab 08/15/11 1819  GLUCAP 113*    Microbiology: Results for orders placed during the hospital encounter of 08/08/11  CULTURE, BLOOD (ROUTINE X 2)     Status: Normal   Collection Time   08/08/11  4:45 PM      Component Value Range Status Comment   Specimen Description BLOOD LEFT ARM   Final    Special Requests BOTTLES DRAWN AEROBIC AND ANAEROBIC 10CC   Final    Culture  Setup Time [id number]   Final    Culture NO GROWTH 5 DAYS   Final    Report Status 08/15/2011 FINAL   Final   CULTURE, BLOOD (ROUTINE X 2)     Status: Normal   Collection Time   08/08/11  4:45 PM      Component Value Range Status Comment   Specimen Description BLOOD LEFT ARM   Final    Special Requests BOTTLES DRAWN AEROBIC ONLY [facility]   Final    Culture  Setup Time [id number]   Final  Culture NO GROWTH 5 DAYS   Final    Report Status 08/15/2011 FINAL   Final   MRSA PCR SCREENING     Status: Normal   Collection Time   08/08/11  9:28 PM      Component Value Range Status Comment   MRSA by PCR NEGATIVE  NEGATIVE  Final   AFB CULTURE, BLOOD     Status: Normal (Preliminary result)   Collection Time   08/09/11  9:00 PM      Component Value Range Status Comment   Specimen Description BLOOD ARM RIGHT   Final    Special Requests 5CC AFB   Final    Culture     Final    Value: CULTURE WILL BE EXAMINED FOR 6 WEEKS BEFORE ISSUING A FINAL REPORT   Report Status PENDING   Incomplete   FUNGUS CULTURE, BLOOD     Status: Normal (Preliminary result)   Collection Time   08/12/11  3:18 PM      Component Value Range Status Comment   Specimen Description BLOOD LEFT ARM   Final    Special Requests BOTTLES DRAWN AEROBIC AND ANAEROBIC 10CC   Final    Culture NO FUNGUS ISOLATED;CULTURE IN PROGRESS FOR 7 DAYS   Final    Report Status PENDING   Incomplete    CULTURE, BLOOD (ROUTINE X 2)     Status: Normal (Preliminary result)   Collection Time   08/12/11  6:00 PM      Component Value Range Status Comment   Specimen Description BLOOD ARM RIGHT   Final    Special Requests BOTTLES DRAWN AEROBIC ONLY 6CC   Final    Culture  Setup Time [id number]   Final    Culture     Final    Value:        BLOOD CULTURE RECEIVED NO GROWTH TO DATE CULTURE WILL BE HELD FOR 5 DAYS BEFORE ISSUING A FINAL NEGATIVE REPORT   Report Status PENDING   Incomplete   FUNGUS CULTURE, BLOOD     Status: Normal (Preliminary result)   Collection Time   08/12/11  6:00 PM      Component Value Range Status Comment   Specimen Description BLOOD RIGHT ARM   Final    Special Requests BOTTLES DRAWN AEROBIC ONLY 6CC   Final    Culture NO FUNGUS ISOLATED;CULTURE IN PROGRESS FOR 7 DAYS   Final    Report Status PENDING   Incomplete   CULTURE, BLOOD (ROUTINE X 2)     Status: Normal (Preliminary result)   Collection Time   08/12/11  6:15 PM      Component Value Range Status Comment   Specimen Description BLOOD ARM LEFT   Final    Special Requests BOTTLES DRAWN AEROBIC AND ANAEROBIC 10CC   Final    Culture  Setup Time [id number]   Final    Culture     Final    Value:        BLOOD CULTURE RECEIVED NO GROWTH TO DATE CULTURE WILL BE HELD FOR 5 DAYS BEFORE ISSUING A FINAL NEGATIVE REPORT   Report Status PENDING   Incomplete   CULTURE, BLOOD (ROUTINE X 2)     Status: Normal   Collection Time   08/13/11 12:40 AM      Component Value Range Status Comment   Specimen Description BLOOD RIGHT ARM   Final    Special Requests BOTTLES DRAWN AEROBIC AND ANAEROBIC 5CC   Final  Culture  Setup Time [id number]   Final    Culture     Final    Value: STAPHYLOCOCCUS SPECIES (COAGULASE NEGATIVE)     Note: THE SIGNIFICANCE OF ISOLATING THIS ORGANISM FROM A SINGLE SET OF BLOOD CULTURES WHEN MULTIPLE SETS ARE DRAWN IS UNCERTAIN. PLEASE NOTIFY THE MICROBIOLOGY DEPARTMENT WITHIN ONE WEEK IF SPECIATION AND  SENSITIVITIES ARE REQUIRED.     Note: Gram Stain Report Called to,Read Back By and Verified With: NADINE WELLINGTON 08/14/2011 7:51AM YIMSU   Report Status 08/15/2011 FINAL   Final   CULTURE, BLOOD (ROUTINE X 2)     Status: Normal (Preliminary result)   Collection Time   08/13/11 12:43 AM      Component Value Range Status Comment   Specimen Description BLOOD RIGHT HAND   Final    Special Requests BOTTLES DRAWN AEROBIC AND ANAEROBIC [facility]   Final    Culture  Setup Time [id number]   Final    Culture     Final    Value: GRAM POSITIVE RODS     Note: Gram Stain Report Called to,Read Back By and Verified With: Sallee Lange RN on 08/17/11 at 21:42 by Christie Nottingham   Report Status PENDING   Incomplete   AFB CULTURE, BLOOD     Status: Normal (Preliminary result)   Collection Time   08/13/11 12:45 AM      Component Value Range Status Comment   Specimen Description BLOOD RIGHT HAND   Final    Special Requests 5CC   Final    Culture     Final    Value: CULTURE WILL BE EXAMINED FOR 6 WEEKS BEFORE ISSUING A FINAL REPORT   Report Status PENDING   Incomplete   WOUND CULTURE     Status: Normal   Collection Time   08/15/11  4:08 PM      Component Value Range Status Comment   Specimen Description WOUND   Final    Special Requests INFECTED PACING LEAD   Final    Gram Stain     Final    Value: NO WBC SEEN     NO SQUAMOUS EPITHELIAL CELLS SEEN     NO ORGANISMS SEEN   Culture FEW CANDIDA ALBICANS   Final    Report Status 08/18/2011 FINAL   Final   FUNGUS CULTURE W SMEAR     Status: Normal (Preliminary result)   Collection Time   08/15/11  4:08 PM      Component Value Range Status Comment   Specimen Description WOUND   Final    Special Requests INFECTED PACING LEAD   Final    Fungal Smear FEW HYPHAE   Final    Culture CULTURE IN PROGRESS FOR FOUR WEEKS   Final    Report Status PENDING   Incomplete   WOUND CULTURE     Status: Normal   Collection Time   08/15/11  4:10 PM      Component Value Range  Status Comment   Specimen Description WOUND   Final    Special Requests ICD DEVICE POCKET SWAB REC'D   Final    Gram Stain     Final    Value: NO WBC SEEN     NO SQUAMOUS EPITHELIAL CELLS SEEN     NO ORGANISMS SEEN   Culture NO GROWTH 2 DAYS   Final    Report Status 08/18/2011 FINAL   Final   FUNGUS CULTURE W SMEAR     Status: Normal (Preliminary  result)   Collection Time   08/15/11  4:10 PM      Component Value Range Status Comment   Specimen Description WOUND   Final    Special Requests ICD DEVICE POCKET SWAB REC'D   Final    Fungal Smear NO YEAST OR FUNGAL ELEMENTS SEEN   Final    Culture CULTURE IN PROGRESS FOR FOUR WEEKS   Final    Report Status PENDING   Incomplete   CULTURE, BLOOD (ROUTINE X 2)     Status: Normal (Preliminary result)   Collection Time   08/16/11  4:05 AM      Component Value Range Status Comment   Specimen Description BLOOD RIGHT WRIST   Final    Special Requests BOTTLES DRAWN AEROBIC ONLY 1CC   Final    Culture  Setup Time [id number]   Final    Culture     Final    Value:        BLOOD CULTURE RECEIVED NO GROWTH TO DATE CULTURE WILL BE HELD FOR 5 DAYS BEFORE ISSUING A FINAL NEGATIVE REPORT   Report Status PENDING   Incomplete   CULTURE, BLOOD (ROUTINE X 2)     Status: Normal (Preliminary result)   Collection Time   08/16/11  4:15 AM      Component Value Range Status Comment   Specimen Description BLOOD RIGHT HAND   Final    Special Requests BOTTLES DRAWN AEROBIC ONLY 2CC   Final    Culture  Setup Time [id number]   Final    Culture     Final    Value:        BLOOD CULTURE RECEIVED NO GROWTH TO DATE CULTURE WILL BE HELD FOR 5 DAYS BEFORE ISSUING A FINAL NEGATIVE REPORT   Report Status PENDING   Incomplete    Pertinent ID Labs:  HIV Ab  Nonreactive   RPR  Negative   Cryptococcal Ag  Negative   Brucella species Ab  Negative  Quantiferon gold  Negative  HLA-B27 Ag  Negative  Chlamydia Ab  Negative  Q fever  Negative  Fungal Antibodies  Negative     Other Pertinent Labs:  ANA  Negative   RF  35 (high)   TSH  1.5 (wnl)   Uric acid  5.5 (wnl)     Imaging: Dg Chest 2 View  08/18/2011  *RADIOLOGY REPORT*  Clinical Data: Shortness of breath, congestion, possible pneumonia, CHF  CHEST - 2 VIEW  Comparison: 08/17/2011; 08/16/2011; 08/15/2011  Findings: Grossly unchanged enlarged cardiac silhouette and mediastinal contours post median sternotomy.  Lung volumes remain persistently reduced.  Interval consolidation of peripheral heterogeneous / consolidative opacities within the right mid lung. No definite pleural effusion or pneumothorax.  Unchanged bones.  IMPRESSION: Worsening consolidative opacities within the right mid lung worrisome for progression of infection.  Continued attention on follow-up is recommended.  Original Report Authenticated By: Waynard Reeds, M.D.   Dg Chest Port 1 View  08/17/2011  *RADIOLOGY REPORT*  Clinical Data: Respiratory failure  PORTABLE CHEST - 1 VIEW  Comparison: 08/16/2011; 08/15/2011; 08/08/2011  Findings:  Grossly unchanged enlarged cardiac silhouette and 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  Interval extubation and removal of enteric tube.  The lungs remain hyperinflated.  Ill- defined heterogeneous opacities within the right mid lung, grossly unchanged.  No definite pneumothorax or pleural effusion. Unchanged bones.  IMPRESSION: 1.  Interval extubation and removal of enteric tube.  No pneumothorax. 2.  Grossly unchanged  right mid lung heterogeneous opacities, possibly atelectasis, though developing infection is not excluded. Continued attention on follow-up is recommended.  Original Report Authenticated By: Waynard Reeds, M.D.       Medications:    Infusions:    . sodium chloride 20 mL/hr at 08/16/11 2011    Scheduled Medications:    . antiseptic oral rinse  15 mL Mouth Rinse QID  . aspirin EC  81 mg Oral Daily  . chlorhexidine  15 mL Mouth/Throat BID  . feeding supplement   237 mL Oral BID BM  . gentamicin irrigation   Irrigation Once  . metoprolol  25 mg Oral BID  . micafungin [facility]) IV  150 mg Intravenous Daily  . mulitivitamin with minerals  1 tablet Oral Daily  . piperacillin-tazobactam (ZOSYN)  IV  3.375 g Intravenous Q8H  . polyethylene glycol  17 g Oral Daily  . DISCONTD: vancomycin  1,250 mg Intravenous Q12H    PRN Medications: acetaminophen, metoprolol, ondansetron (ZOFRAN) IV, oxyCODONE-acetaminophen, zolpidem, DISCONTD: fentaNYL   Assessment/ Plan:    Patient is a 66 y.o. 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 He is now s/p device and lead extraction on 5/16, which was complicated by respiratory distress and sepsis requiring intubation and subsequent ICU transfer. Patient was extubated on 05/17. Thus far, cultures/stains show hyphae/c.albicans, although no evidence of candidemia. Continues to have high fevers despite removal of AICD device and ongoing Vanc (dc'ed today), Zosyn, and Micafungin. Luckily, patient actually improved today.  1) C. albicans cardiac device endocarditis - patient is now s/p extraction of Biventricular implantable cardioverter-  defibrillator system. - Appreciate ID assistance and input in the management of our patient.   - Continue micafungin  - Will consult ophthalmology tomorrow to evaluate for endophthalmitis if remains febrile. - Greatly appreciate the assistance of all consulting teams regarding this very complex and intriguing case  - Will get baseline B-D glucan/fungitell assay so that we can repeat in 4-6 wks to see that it is trending down appropriately.  2) Pulmonary infiltrates - possibly secondary to septic emboli following procedure vs possible atelectasis (although wouldn't expect such high fevers). - Will check CTA  chest.  3) Monoclonal gammopathy - elevated protein gap of 5 at admission. M protein spike seen on SPEP. 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 Quantitative immunofixation showing IgM, serum of 723, and IgA 537. - Will get a skeletal survey to finish workup, now that the is s/p his surgery.  4) Ischemic cardiomyopathy - last echocardiogram in 08/15/2011 showing left ventricular ejection fraction of 25% and diffuse hypokinesis.  - Appreciate cardiology recommendations.  - continue metoprolol BID  - continue ASA   6) Normocytic anemia - anemia is acute and has developed since March when hemoglobin was 14. Anemia panel showing Fe 21, TIBC 241, ferritin 597. Although most consistent with an anemia of chronic disease, transferrin saturation is low, as well, ferritin may be acutely elevated 2/2 to the fevers. Therefore, difficulty to ascertain definitively for now. Pt has last colonoscopy in 2012 showing sigmoid and descending diverticula (but no recurrent polyps - last noted to have hyperplastic polyps in 2001). Reticulocyte index based on reticulocyte % of 1.9 and HCT 36.1 is 1.01. This may indicate hypoproliferation, may be due to his iron deficiency or his anemia of chronic disease, but could also  be malignancy related. FOBT negative. Peripheral smear showed only anemia with thrombocytopenia and no atypical morphologies.  - Further workup deferred as it is likely secondary to anemia of chronic disease from chronic infection   7) GERD/Esophageal stricture - s/p EGD and dilation on 08/12/11. Advancing diet as tolerated.  - Continue Protonix daily  - Micafungin for esophageal candidiasis, that will then be transitioned to fluconazole.  - Will get baseline B-D glucan/fungitell assay so that we can repeat in 4-6 wks to see that it is trending down appropriately.  8) Thrombocytopenia - improving today. Smear also confirms thrombocytopenia.  - Continue to monitor for signs  of bleeding.  9) Constipation - stable, patient had bowel movement yesterday  - Continue docusate, miralax, and senokot-S   10) CAD h/o CABG x 4 (2003) - Stable. CE negative x 3 on admission. Unable to take statins or ACE inhibitors.  - Continue metoprolol  - Continue ASA   11) DVT ppx - SCD's    Length of Stay: 10 days   Johnette Abraham, Norman Herrlich, Internal Medicine Resident 08/18/2011, 6:02 PM

## 2011-08-18 NOTE — Progress Notes (Signed)
ANTIBIOTIC CONSULT NOTE - FOLLOW UP  Pharmacy Consult for Vancomycin, Zosyn, Micafungin Indication: cardiac device-associated infective endocarditis  Allergies  Allergen Reactions  . Avapro (Irbesartan)   . Codeine   . Crestor (Rosuvastatin Calcium)   . Lipitor (Atorvastatin Calcium)   . Lisinopril Cough  . Morphine   . Zocor (Simvastatin - High Dose)     Patient Measurements: Height: 5\' 10"  (177.8 cm) Weight: 201 lb 11.5 oz (91.5 kg) IBW/kg (Calculated) : 73   Vital Signs: Temp: 98.6 F (37 C) (05/19 0500) Temp src: Rectal (05/19 0100) BP: 125/76 mmHg (05/19 0500) Pulse Rate: 81  (05/19 0500) Intake/Output from previous day: 05/18 0701 - 05/19 0700 In: 400 [P.O.:120; I.V.:280] Out: 602 [Urine:600; Stool:2] Intake/Output from this shift:    Labs:  Basename 08/18/11 0515 08/17/11 0415 08/16/11 0445  WBC 10.6* 8.1 16.6*  HGB 9.1* 8.3* 9.4*  PLT 125* 100* 130*  LABCREA -- -- --  CREATININE 0.75 1.08 1.59*   Estimated Creatinine Clearance: 104.7 ml/min (by C-G formula based on Cr of 0.75). No results found for this basename: VANCOTROUGH:2,VANCOPEAK:2,VANCORANDOM:2,GENTTROUGH:2,GENTPEAK:2,GENTRANDOM:2,TOBRATROUGH:2,TOBRAPEAK:2,TOBRARND:2,AMIKACINPEAK:2,AMIKACINTROU:2,AMIKACIN:2, in the last 72 hours   Microbiology: Recent Results (from the past 720 hour(s))  CULTURE, BLOOD (SINGLE)     Status: Normal   Collection Time   07/29/11  5:01 PM      Component Value Range Status Comment   Organism ID, Bacteria NO GROWTH 5 DAYS   Final   CULTURE, BLOOD (SINGLE)     Status: Normal   Collection Time   07/29/11  5:08 PM      Component Value Range Status Comment   Organism ID, Bacteria NO GROWTH 5 DAYS   Final   SURGICAL PCR SCREEN     Status: Abnormal   Collection Time   08/02/11 11:56 AM      Component Value Range Status Comment   MRSA, PCR POSITIVE (*) NEGATIVE  Final    Staphylococcus aureus POSITIVE (*) NEGATIVE  Final   CULTURE, BLOOD (ROUTINE X 2)     Status: Normal     Collection Time   08/08/11  4:45 PM      Component Value Range Status Comment   Specimen Description BLOOD LEFT ARM   Final    Special Requests BOTTLES DRAWN AEROBIC AND ANAEROBIC 10CC   Final    Culture  Setup Time [id number]   Final    Culture NO GROWTH 5 DAYS   Final    Report Status 08/15/2011 FINAL   Final   CULTURE, BLOOD (ROUTINE X 2)     Status: Normal   Collection Time   08/08/11  4:45 PM      Component Value Range Status Comment   Specimen Description BLOOD LEFT ARM   Final    Special Requests BOTTLES DRAWN AEROBIC ONLY 3CC   Final    Culture  Setup Time [id number]   Final    Culture NO GROWTH 5 DAYS   Final    Report Status 08/15/2011 FINAL   Final   MRSA PCR SCREENING     Status: Normal   Collection Time   08/08/11  9:28 PM      Component Value Range Status Comment   MRSA by PCR NEGATIVE  NEGATIVE  Final   AFB CULTURE, BLOOD     Status: Normal (Preliminary result)   Collection Time   08/09/11  9:00 PM      Component Value Range Status Comment   Specimen Description BLOOD ARM RIGHT   Final  Special Requests 5CC AFB   Final    Culture     Final    Value: CULTURE WILL BE EXAMINED FOR 6 WEEKS BEFORE ISSUING A FINAL REPORT   Report Status PENDING   Incomplete   FUNGUS CULTURE, BLOOD     Status: Normal (Preliminary result)   Collection Time   08/12/11  3:18 PM      Component Value Range Status Comment   Specimen Description BLOOD LEFT ARM   Final    Special Requests BOTTLES DRAWN AEROBIC AND ANAEROBIC 10CC   Final    Culture NO FUNGUS ISOLATED;CULTURE IN PROGRESS FOR 7 DAYS   Final    Report Status PENDING   Incomplete   CULTURE, BLOOD (ROUTINE X 2)     Status: Normal (Preliminary result)   Collection Time   08/12/11  6:00 PM      Component Value Range Status Comment   Specimen Description BLOOD ARM RIGHT   Final    Special Requests BOTTLES DRAWN AEROBIC ONLY 6CC   Final    Culture  Setup Time [id number]   Final    Culture     Final    Value:        BLOOD  CULTURE RECEIVED NO GROWTH TO DATE CULTURE WILL BE HELD FOR 5 DAYS BEFORE ISSUING A FINAL NEGATIVE REPORT   Report Status PENDING   Incomplete   FUNGUS CULTURE, BLOOD     Status: Normal (Preliminary result)   Collection Time   08/12/11  6:00 PM      Component Value Range Status Comment   Specimen Description BLOOD RIGHT ARM   Final    Special Requests BOTTLES DRAWN AEROBIC ONLY 6CC   Final    Culture NO FUNGUS ISOLATED;CULTURE IN PROGRESS FOR 7 DAYS   Final    Report Status PENDING   Incomplete   CULTURE, BLOOD (ROUTINE X 2)     Status: Normal (Preliminary result)   Collection Time   08/12/11  6:15 PM      Component Value Range Status Comment   Specimen Description BLOOD ARM LEFT   Final    Special Requests BOTTLES DRAWN AEROBIC AND ANAEROBIC 10CC   Final    Culture  Setup Time [id number]   Final    Culture     Final    Value:        BLOOD CULTURE RECEIVED NO GROWTH TO DATE CULTURE WILL BE HELD FOR 5 DAYS BEFORE ISSUING A FINAL NEGATIVE REPORT   Report Status PENDING   Incomplete   CULTURE, BLOOD (ROUTINE X 2)     Status: Normal   Collection Time   08/13/11 12:40 AM      Component Value Range Status Comment   Specimen Description BLOOD RIGHT ARM   Final    Special Requests BOTTLES DRAWN AEROBIC AND ANAEROBIC 5CC   Final    Culture  Setup Time [id number]   Final    Culture     Final    Value: STAPHYLOCOCCUS SPECIES (COAGULASE NEGATIVE)     Note: THE SIGNIFICANCE OF ISOLATING THIS ORGANISM FROM A SINGLE SET OF BLOOD CULTURES WHEN MULTIPLE SETS ARE DRAWN IS UNCERTAIN. PLEASE NOTIFY THE MICROBIOLOGY DEPARTMENT WITHIN ONE WEEK IF SPECIATION AND SENSITIVITIES ARE REQUIRED.     Note: Gram Stain Report Called to,Read Back By and Verified With: NADINE WELLINGTON 08/14/2011 7:51AM YIMSU   Report Status 08/15/2011 FINAL   Final   CULTURE, BLOOD (ROUTINE X 2)  Status: Normal (Preliminary result)   Collection Time   08/13/11 12:43 AM      Component Value Range Status Comment   Specimen  Description BLOOD RIGHT HAND   Final    Special Requests BOTTLES DRAWN AEROBIC AND ANAEROBIC [facility]   Final    Culture  Setup Time [id number]   Final    Culture     Final    Value: GRAM POSITIVE RODS     Note: Gram Stain Report Called to,Read Back By and Verified With: Sallee Lange RN on 08/17/11 at 21:42 by Christie Nottingham   Report Status PENDING   Incomplete   AFB CULTURE, BLOOD     Status: Normal (Preliminary result)   Collection Time   08/13/11 12:45 AM      Component Value Range Status Comment   Specimen Description BLOOD RIGHT HAND   Final    Special Requests 5CC   Final    Culture     Final    Value: CULTURE WILL BE EXAMINED FOR 6 WEEKS BEFORE ISSUING A FINAL REPORT   Report Status PENDING   Incomplete   WOUND CULTURE     Status: Normal   Collection Time   08/15/11  4:08 PM      Component Value Range Status Comment   Specimen Description WOUND   Final    Special Requests INFECTED PACING LEAD   Final    Gram Stain     Final    Value: NO WBC SEEN     NO SQUAMOUS EPITHELIAL CELLS SEEN     NO ORGANISMS SEEN   Culture FEW CANDIDA ALBICANS   Final    Report Status 08/18/2011 FINAL   Final   FUNGUS CULTURE W SMEAR     Status: Normal (Preliminary result)   Collection Time   08/15/11  4:08 PM      Component Value Range Status Comment   Specimen Description WOUND   Final    Special Requests INFECTED PACING LEAD   Final    Fungal Smear FEW HYPHAE   Final    Culture CULTURE IN PROGRESS FOR FOUR WEEKS   Final    Report Status PENDING   Incomplete   WOUND CULTURE     Status: Normal   Collection Time   08/15/11  4:10 PM      Component Value Range Status Comment   Specimen Description WOUND   Final    Special Requests ICD DEVICE POCKET SWAB REC'D   Final    Gram Stain     Final    Value: NO WBC SEEN     NO SQUAMOUS EPITHELIAL CELLS SEEN     NO ORGANISMS SEEN   Culture NO GROWTH 2 DAYS   Final    Report Status 08/18/2011 FINAL   Final   FUNGUS CULTURE W SMEAR     Status: Normal  (Preliminary result)   Collection Time   08/15/11  4:10 PM      Component Value Range Status Comment   Specimen Description WOUND   Final    Special Requests ICD DEVICE POCKET SWAB REC'D   Final    Fungal Smear NO YEAST OR FUNGAL ELEMENTS SEEN   Final    Culture CULTURE IN PROGRESS FOR FOUR WEEKS   Final    Report Status PENDING   Incomplete   CULTURE, BLOOD (ROUTINE X 2)     Status: Normal (Preliminary result)   Collection Time   08/16/11  4:05 AM  Component Value Range Status Comment   Specimen Description BLOOD RIGHT WRIST   Final    Special Requests BOTTLES DRAWN AEROBIC ONLY 1CC   Final    Culture  Setup Time [id number]   Final    Culture     Final    Value:        BLOOD CULTURE RECEIVED NO GROWTH TO DATE CULTURE WILL BE HELD FOR 5 DAYS BEFORE ISSUING A FINAL NEGATIVE REPORT   Report Status PENDING   Incomplete   CULTURE, BLOOD (ROUTINE X 2)     Status: Normal (Preliminary result)   Collection Time   08/16/11  4:15 AM      Component Value Range Status Comment   Specimen Description BLOOD RIGHT HAND   Final    Special Requests BOTTLES DRAWN AEROBIC ONLY 2CC   Final    Culture  Setup Time [id number]   Final    Culture     Final    Value:        BLOOD CULTURE RECEIVED NO GROWTH TO DATE CULTURE WILL BE HELD FOR 5 DAYS BEFORE ISSUING A FINAL NEGATIVE REPORT   Report Status PENDING   Incomplete     Anti-infectives     Start     Dose/Rate Route Frequency Ordered Stop   08/15/11 2200  piperacillin-tazobactam (ZOSYN) IVPB 3.375 g       3.375 g 12.5 mL/hr over 240 Minutes Intravenous 3 times per day 08/15/11 1758     08/15/11 1830   vancomycin (VANCOCIN) 1,250 mg in sodium chloride 0.9 % 250 mL IVPB        1,250 mg 166.7 mL/hr over 90 Minutes Intravenous Every 12 hours 08/15/11 1758     08/15/11 1535   gentamycin 80 mg in 0.9% normal saline 250 mL irrigation  Status:  Discontinued          As needed 08/15/11 1535 08/15/11 1608   08/15/11 1515   gentamycin 80 mg in 0.9%  normal saline 250 mL irrigation  Status:  Discontinued        1 application Irrigation  Once 08/15/11 1507 08/15/11 1509   08/15/11 1515   gentamicin (GARAMYCIN) 80 mg in sodium chloride irrigation 0.9 % 500 mL irrigation         Irrigation Once 08/15/11 1510     08/14/11 1500   gentamicin (GARAMYCIN) 80 mg in sodium chloride irrigation 0.9 % 500 mL irrigation  Status:  Discontinued        80 mg Irrigation On call 08/14/11 1412 08/15/11 1819   08/14/11 1500   ceFAZolin (ANCEF) IVPB 2 g/50 mL premix        2 g 100 mL/hr over 30 Minutes Intravenous On call 08/14/11 1412 08/15/11 1430   08/13/11 1200   micafungin (MYCAMINE) 150 mg in sodium chloride 0.9 % 100 mL IVPB     Comments: Wait until fungal blood cultures are drawn before starting      150 mg 100 mL/hr over 1 Hours Intravenous Daily 08/13/11 0948     08/13/11 1000   fluconazole (DIFLUCAN) tablet 100 mg  Status:  Discontinued        100 mg Oral Daily 08/12/11 1125 08/13/11 0948   08/12/11 1200   fluconazole (DIFLUCAN) tablet 200 mg        200 mg Oral Daily 08/12/11 1125 08/12/11 1426          Assessment: Cardiac device-associated endocarditis:  Currently on Day #6 of Micafungin, Day #  4 Vancomycin/Zosyn empirically.  Awaiting for microbiologic data to finalize before streamlining antimicrobial therapy to fungal coverage alone.  His acute renal failure appears to have resolved.  Goal of Therapy:  Vancomycin trough level 15-20 mcg/ml Appropriate antimicrobial therapy  Plan:  Check Vancomycin trough before this evening's dose. Continue to monitor renal function and microbiologic data.  Estella Husk, Pharm.D., BCPS Clinical Pharmacist  Phone [phone] Pager [phone] 08/18/2011, 9:48 AM

## 2011-08-18 NOTE — Progress Notes (Signed)
Patient ID: [name], male   DOB: 27-Jul-1945, 66 y.o.   MRN: [id number] Subjective:  C/o aching all over and fever and chills.  Objective:  Vital Signs in the last 24 hours: Temp:  [98 F (36.7 C)-102 F (38.9 C)] 98.6 F (37 C) (05/19 0500) Pulse Rate:  [81-118] 81  (05/19 0500) Resp:  [20-35] 20  (05/19 0500) BP: (108-147)/(55-78) 125/76 mmHg (05/19 0500) SpO2:  [90 %-99 %] 99 % (05/19 0500)  Intake/Output from previous day: 05/18 0701 - 05/19 0700 In: 400 [P.O.:120; I.V.:280] Out: 602 [Urine:600; Stool:2] Intake/Output from this shift:    Physical Exam: ill appearing NAD HEENT: Unremarkable Neck:  No JVD, no thyromegally Lungs:  Clear except for scattered rales HEART:  Regular tachy rhythm, no murmurs, no rubs, no clicks Abd:  Flat, positive bowel sounds, no organomegally, no rebound, no guarding Ext:  2 plus pulses, no edema, no cyanosis, no clubbing Skin:  No rashes no nodules Neuro:  CN II through XII intact, motor grossly intact  Lab Results:  Basename 08/18/11 0515 08/17/11 0415  WBC 10.6* 8.1  HGB 9.1* 8.3*  PLT 125* 100*    Basename 08/18/11 0515 08/17/11 0415  NA 132* 131*  K 4.1 4.3  CL 99 100  CO2 23 23  GLUCOSE 110* 105*  BUN 12 20  CREATININE 0.75 1.08   No results found for this basename: TROPONINI:2,CK,MB:2 in the last 72 hours Hepatic Function Panel  Basename 08/16/11 0445  PROT 6.0  ALBUMIN 2.1*  AST 26  ALT 12  ALKPHOS 40  BILITOT 0.5  BILIDIR --  IBILI --   No results found for this basename: CHOL in the last 72 hours No results found for this basename: PROTIME in the last 72 hours  Imaging: Dg Chest Port 1 View  08/17/2011  *RADIOLOGY REPORT*  Clinical Data: Respiratory failure  PORTABLE CHEST - 1 VIEW  Comparison: 08/16/2011; 08/15/2011; 08/08/2011  Findings:  Grossly unchanged enlarged cardiac silhouette and 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  Interval extubation and removal of enteric tube.  The  lungs remain hyperinflated.  Ill- defined heterogeneous opacities within the right mid lung, grossly unchanged.  No definite pneumothorax or pleural effusion. Unchanged bones.  IMPRESSION: 1.  Interval extubation and removal of enteric tube.  No pneumothorax. 2.  Grossly unchanged right mid lung heterogeneous opacities, possibly atelectasis, though developing infection is not excluded. Continued attention on follow-up is recommended.  Original Report Authenticated By: Waynard Reeds, M.D.    Cardiac Studies: Tele - sinus tachy? Assessment/Plan:  1. Undiagnosed infection 2. ICD lead endocarditis 3. S/p extraction 4. Persisten fever/chills 5. Sinus tachycardia Rec: I suspect his persistent fevers due to infection burden now in lungs. I would like a repeat CXR and will check ECG to confirm sinus tachycardia. Would continue broad spectrum anti-biotics until we have organism.   LOS: 10 days    Lewayne Bunting 08/18/2011, 10:21 AM

## 2011-08-18 NOTE — Progress Notes (Signed)
anerobic blood cultures positive for Gm + rods.  [name]

## 2011-08-18 NOTE — Progress Notes (Addendum)
INFECTIOUS DISEASE PROGRESS NOTE  ID: [name] is a 66 y.o. 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 Cultures/stains show hyphae/c.albicans, although no evidence of candidemia. Currently on micafungin, vanco and piptazo -- continues have fever post device removal  Subjective:  Patient feels much improved today after having better pain control. He did however have Tmax 102F in late afternoon, but afebrile since. Sitting up without difficulty. Using incentive spirometer  Fever curve= trending down over the last few days but still having occ. fever spike.  Abtx:  vanco 5/16 - present (#4) Piptazo 5/16 - present(#4) Mica 5/10- present  Medications:     . antiseptic oral rinse  15 mL Mouth Rinse QID  . aspirin EC  81 mg Oral Daily  . chlorhexidine  15 mL Mouth/Throat BID  . feeding supplement  237 mL Oral BID BM  . gentamicin irrigation   Irrigation Once  . metoprolol  25 mg Oral BID  . micafungin [facility]) IV  150 mg Intravenous Daily  . mulitivitamin with minerals  1 tablet Oral Daily  . piperacillin-tazobactam (ZOSYN)  IV  3.375 g Intravenous Q8H  . polyethylene glycol  17 g Oral Daily  . vancomycin  1,250 mg Intravenous Q12H  . white petrolatum      . DISCONTD: enoxaparin  40 mg Subcutaneous Q24H    Objective: Vital signs in last 24 hours: Temp:  [98 F (36.7 C)-102 F (38.9 C)] 98.6 F (37 C) (05/19 0500) Pulse Rate:  [81-118] 81  (05/19 0500) Resp:  [20-35] 20  (05/19 0500) BP: (108-147)/(55-78) 125/76 mmHg (05/19 0500) SpO2:  [90 %-99 %] 99 % (05/19 0500) Physical Exam  Constitutional: He is oriented to person, place, and time. He appears well-developed and well-nourished. No distress.  HENT:  Mouth/Throat: Oropharynx is clear and moist. No oropharyngeal exudate.    Cardiovascular: tachycardic, regular rhythm and normal heart sounds. Exam reveals no gallop and no friction rub.  No murmur heard.  Chest wall= left upper incision, is clean dry and intact, Pulmonary/Chest: tachypnic and breath sounds normal. No respiratory distress. He has no wheezes.  Abdominal: Soft. Bowel sounds are normal. He exhibits no distension. There is no tenderness.  Lymphadenopathy:  He has no cervical adenopathy.  Neurological: He is alert and oriented to person, place, and time.  Skin: Skin is warm and midly diaphoretic Psychiatric: He has a normal mood and affect. His behavior is normal.    Lab Results  [facility] 08/18/11 0515 08/17/11 0415  WBC 10.6* 8.1  HGB 9.1* 8.3*  HCT 28.3* 25.8*  NA 132* 131*  K 4.1 4.3  CL 99 100  CO2 23 23  BUN 12 20  CREATININE 0.75 1.08  GLU -- --   Liver Panel  Basename 08/16/11 0445  PROT 6.0  ALBUMIN 2.1*  AST 26  ALT 12  ALKPHOS 40  BILITOT 0.5  BILIDIR --  IBILI --   - B-D glucan pending 5/18  Microbiology: 5/17 blood cx NGTD 5/16 lead cx + c.albicans ( gram stain -> says "hyphae", not pseudohyphae) 5/14 blood  1 bottle CoNS 5/14 blood + GPR, growth on day 4 5/13 blood cx - 5/13 fungal blood cx - 5/10 afb blood cx -  Assessment/Plan: Candidal cardiac device infective endocarditis, blood cx negative, hyphae/c.albicans found on lead vegetation is thought to be  rare. Fungal IE is estimated as 1% of all IE cases, and even smaller percentage when considering cardiac device endocarditis. Nonetheless, still has high mortality rate of 30-40% based on retrospective literature.   Presumably the pathogenesis of his cardiac device endocarditis is that he may have had EGD dilatation back in Oct 2012 instrumentation in setting of candidal esophagitis which may have transiently  candidemic. It is unusual that we have not isolated candida from his blood culture. A retrospective study found 1 in 15 cases of fungal cardiac device IE  that did not have + blood cx.(halawa et al. Mycoses 2011: vol 54) The lack of positive blood cultures would also explain why he was not severely ill.  1) Treatment for c.albicans cardiac device endocarditis =   - continue with micafungin, will need 6-8wks of IV therapy, then transition to oral suppression with fluconazole. - still febrile, concern for embolic phenomenon post lead extraction since it is noted that amongst candidal endocarditis up to 1/3rd may have major fungal embolic event.   - CXR has somewhat new infiltrates on right lung, ddx of aspiration vs atelectesis vs. possible embolic phenomenon. consider getting CTA for further evaluation if respiratory status does not improve.  For now continue on piptazo and discontinue vancomycin since no MRSA colonization.  - if still febrile tomorrow, would rec ophtho to eval for candidal endo-ophthalmitis, since he would need intravitreous ampho.  -  get a surveillance TTE in 5 days, to evaluate tricuspid valve to see if any evidence of new valve abnormality  2) response to therapy = please get a baseline B-D glucan/fungitell assay so that we can repeat in 4-6 wks to see that it is trending down appropriately.  3) abnormal SPEP/UPEP = consider curbside with hem/onc to see if further work up is necessary. If malignancy is a question, that would also explain why he is at risk for invasive fungal infection.  [name] will follow, provide further recs starting Monday.   Carlos Salvia Drue Second MD MPH [facility] [phone]   [name] Infectious Diseases 08/18/2011, 12:16 PM

## 2011-08-19 ENCOUNTER — Inpatient Hospital Stay (HOSPITAL_COMMUNITY): Payer: Medicare Other

## 2011-08-19 DIAGNOSIS — I339 Acute and subacute endocarditis, unspecified: Secondary | ICD-10-CM

## 2011-08-19 LAB — CULTURE, BLOOD (ROUTINE X 2)
Culture  Setup Time: 201305140208
Culture: NO GROWTH
Culture: NO GROWTH

## 2011-08-19 MED ORDER — PANTOPRAZOLE SODIUM 40 MG PO TBEC
40.0000 mg | DELAYED_RELEASE_TABLET | Freq: Every day | ORAL | Status: DC
  Administered 2011-08-19 – 2011-08-23 (×5): 40 mg via ORAL
  Filled 2011-08-19 (×4): qty 1

## 2011-08-19 MED ORDER — SODIUM CHLORIDE 0.9 % IJ SOLN
10.0000 mL | INTRAMUSCULAR | Status: DC | PRN
  Administered 2011-08-20 – 2011-08-23 (×3): 10 mL

## 2011-08-19 MED ORDER — OXYCODONE HCL 10 MG PO TB12
10.0000 mg | ORAL_TABLET | Freq: Two times a day (BID) | ORAL | Status: DC
  Administered 2011-08-19: 10 mg via ORAL
  Filled 2011-08-19: qty 1

## 2011-08-19 NOTE — Progress Notes (Signed)
PROGRESS NOTE  Subjective:   Pt feels better.  Defib pads were partially off ( due to sweat) .  I pulled them off completely.  Objective:    Vital Signs:   Temp:  [98.4 F (36.9 C)-99.6 F (37.6 C)] 99.6 F (37.6 C) (05/20 0500) Pulse Rate:  [91-107] 107  (05/20 0500) Resp:  [16-20] 18  (05/20 0500) BP: (121-134)/(70-76) 132/70 mmHg (05/20 0500) SpO2:  [93 %-99 %] 93 % (05/20 0500) Weight:  [205 lb 0.4 oz (93 kg)] 205 lb 0.4 oz (93 kg) (05/20 0500)  Last BM Date: 08/18/11   24-hour weight change: Weight change:   Weight trends: Filed Weights   08/16/11 0500 08/17/11 0500 08/19/11 0500  Weight: 199 lb 15.3 oz (90.7 kg) 201 lb 11.5 oz (91.5 kg) 205 lb 0.4 oz (93 kg)    Intake/Output:  05/19 0701 - 05/20 0700 In: 480 [P.O.:480] Out: 1400 [Urine:1400]     Physical Exam: BP 132/70  Pulse 107  Temp(Src) 99.6 F (37.6 C) (Oral)  Resp 18  Ht 5\' 10"  (1.778 m)  Wt 205 lb 0.4 oz (93 kg)  BMI 29.42 kg/m2  SpO2 93%  General: Vital signs reviewed and noted. Well-developed, well-nourished, in no acute distress; alert, appropriate and cooperative throughout examination.  Head: Normocephalic, atraumatic.  Eyes: conjunctivae/corneas clear. PERRL, EOM's intact. Fundi benign.  Throat: normal  Neck: Supple. Normal carotids. No JVD  Lungs:  Clear bilaterally to auscultation without wheezes, rales, or rhonchi. Breathing is unlabored.  Heart: Regular rate,  With normal  S1 S2. No murmurs, gallops or rubs  Abdomen:  Soft, non-tender, non-distended with normoactive bowel sounds. No hepatomegaly. No rebound/guarding. No abdominal masses.  Extremities: Distal pedal pulses are 2+ .  No edema.    Neurologic: A&O X3, CN II - XII are grossly intact. Motor strength is 5/5 in the all 4 extremities.  Psych: Responds to questions appropriately with normal affect.    Labs: BMET:  Basename 08/18/11 0515 08/17/11 0415  NA 132* 131*  K 4.1 4.3  CL 99 100  CO2 23 23  GLUCOSE 110* 105*    BUN 12 20  CREATININE 0.75 1.08  CALCIUM 8.5 7.7*  MG -- --  PHOS -- --    Liver function tests: No results found for this basename: AST:2,ALT:2,ALKPHOS:2,BILITOT:2,PROT:2,ALBUMIN:2 in the last 72 hours No results found for this basename: LIPASE:2,AMYLASE:2 in the last 72 hours  CBC:  Basename 08/18/11 0515 08/17/11 0415  WBC 10.6* 8.1  NEUTROABS -- --  HGB 9.1* 8.3*  HCT 28.3* 25.8*  MCV 82.5 83.5  PLT 125* 100*    Cardiac Enzymes: No results found for this basename: CKTOTAL:4,CKMB:4,TROPONINI:4 in the last 72 hours  Coagulation Studies: No results found for this basename: LABPROT:5,INR:5 in the last 72 hours  Other:   Tele:  NSR   Medications:    Infusions:    . sodium chloride 20 mL/hr at 08/16/11 2011    Scheduled Medications:    . antiseptic oral rinse  15 mL Mouth Rinse QID  . aspirin EC  81 mg Oral Daily  . chlorhexidine  15 mL Mouth/Throat BID  . feeding supplement  237 mL Oral BID BM  . gentamicin irrigation   Irrigation Once  . metoprolol  25 mg Oral BID  . micafungin [facility]) IV  150 mg Intravenous Daily  . mulitivitamin with minerals  1 tablet Oral Daily  . piperacillin-tazobactam (ZOSYN)  IV  3.375 g Intravenous Q8H  . polyethylene glycol  17 g Oral Daily  . 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  ICD has been removed and patient is getting better.   Disposition: continue current therapy for fungal infection.  Length of Stay: 11  Vesta Mixer, Montez Hageman., MD, [facility] 08/19/2011, 8:04 AM Office [phone] Pager [phone]
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
## 2011-08-19 NOTE — Progress Notes (Signed)
Medical Student Daily Progress Note  Subjective: Patient reports he is feeling much better relatively speaking. Last night was the first night in a long time he says he did not have his usual fever symptoms (chills, night sweats, rigors). He denies any dyspnea, CP, N/V/D, SOB, HA, or swelling in his extremities. He also reports that his constipation has resolved very well and that he has been using the bathroom very regularly at this time. Also, the cough he has been having for some time now was mitigated very well with his first couple doses of percocet that he has received since yesterday.  Objective: Vital signs in last 24 hours: Filed Vitals:   08/18/11 1400 08/18/11 2100 08/19/11 0500 08/19/11 0948  BP: 121/71 134/76 132/70 126/77  Pulse: 91 97 107 101  Temp: 98.4 F (36.9 C) 98.9 F (37.2 C) 99.6 F (37.6 C)   TempSrc: Oral     Resp: 20 16 18    Height:      Weight:   93 kg (205 lb 0.4 oz)   SpO2: 99% 96% 93%    Weight change:   Intake/Output Summary (Last 24 hours) at 08/19/11 1038 Last data filed at 08/19/11 0500  Gross per 24 hour  Intake    480 ml  Output   1400 ml  Net   -920 ml   Physical Exam: BP 126/77  Pulse 101  Temp(Src) 99.6 F (37.6 C) (Oral)  Resp 18  Ht 5\' 10"  (1.778 m)  Wt 93 kg (205 lb 0.4 oz)  BMI 29.42 kg/m2  SpO2 93% General appearance: alert, cooperative and no distress Head: Normocephalic, without obvious abnormality, atraumatic Eyes: conjunctivae/corneas clear. PERRL, EOM's intact. Fundi benign. Back: symmetric, no curvature. ROM normal. No CVA tenderness. Lungs: diminished breath sounds RML and RUL Chest wall: no tenderness Heart: regular rate and rhythm, S1, S2 normal, no murmur, click, rub or gallop Abdomen: soft, non-tender; bowel sounds normal; no masses,  no organomegaly Extremities: extremities normal, atraumatic, no cyanosis or edema Skin: Skin color, texture, turgor normal. No rashes or lesions Lab Results: Basic Metabolic  Panel:  Lab 08/18/11 0515 08/17/11 0415 08/16/11 0445 08/15/11 1730  NA 132* 131* -- --  K 4.1 4.3 -- --  CL 99 100 -- --  CO2 23 23 -- --  GLUCOSE 110* 105* -- --  BUN 12 20 -- --  CREATININE 0.75 1.08 -- --  CALCIUM 8.5 7.7* -- --  MG -- -- 1.6 2.0  PHOS -- -- 3.1 5.5*   Liver Function Tests:  Lab 08/16/11 0445  AST 26  ALT 12  ALKPHOS 40  BILITOT 0.5  PROT 6.0  ALBUMIN 2.1*   No results found for this basename: LIPASE:2,AMYLASE:2 in the last 168 hours No results found for this basename: AMMONIA:2 in the last 168 hours CBC:  Lab 08/18/11 0515 08/17/11 0415 08/13/11 0540  WBC 10.6* 8.1 --  NEUTROABS -- -- 4.2  HGB 9.1* 8.3* --  HCT 28.3* 25.8* --  MCV 82.5 83.5 --  PLT 125* 100* --   Cardiac Enzymes: No results found for this basename: CKTOTAL:3,CKMB:3,CKMBINDEX:3,TROPONINI:3 in the last 168 hours BNP:  Lab 08/17/11 0415  PROBNP 4005.0*   D-Dimer: No results found for this basename: DDIMER:2 in the last 168 hours CBG:  Lab 08/15/11 1819  GLUCAP 113*   Hemoglobin A1C: No results found for this basename: HGBA1C in the last 168 hours Fasting Lipid Panel: No results found for this basename: CHOL,HDL,LDLCALC,TRIG,CHOLHDL,LDLDIRECT in the last 161 hours  Thyroid Function Tests: No results found for this basename: TSH,T4TOTAL,FREET4,T3FREE,THYROIDAB in the last 168 hours Coagulation: No results found for this basename: LABPROT:4,INR:4 in the last 168 hours Anemia Panel: No results found for this basename: VITAMINB12,FOLATE,FERRITIN,TIBC,IRON,RETICCTPCT in the last 168 hours Urine Drug Screen: Drugs of Abuse  No results found for this basename: labopia, cocainscrnur, labbenz, amphetmu, thcu, labbarb    Alcohol Level: No results found for this basename: ETH:2 in the last 168 hours Urinalysis: No results found for this basename:  COLORURINE:2,APPERANCEUR:2,LABSPEC:2,PHURINE:2,GLUCOSEU:2,HGBUR:2,BILIRUBINUR:2,KETONESUR:2,PROTEINUR:2,UROBILINOGEN:2,NITRITE:2,LEUKOCYTESUR:2 in the last 168 hours  Micro Results: Recent Results (from the past 240 hour(s))  AFB CULTURE, BLOOD     Status: Normal (Preliminary result)   Collection Time   08/09/11  9:00 PM      Component Value Range Status Comment   Specimen Description BLOOD ARM RIGHT   Final    Special Requests 5CC AFB   Final    Culture     Final    Value: CULTURE WILL BE EXAMINED FOR 6 WEEKS BEFORE ISSUING A FINAL REPORT   Report Status PENDING   Incomplete   FUNGUS CULTURE, BLOOD     Status: Normal (Preliminary result)   Collection Time   08/12/11  3:18 PM      Component Value Range Status Comment   Specimen Description BLOOD LEFT ARM   Final    Special Requests BOTTLES DRAWN AEROBIC AND ANAEROBIC 10CC   Final    Culture NO FUNGUS ISOLATED;CULTURE IN PROGRESS FOR 7 DAYS   Final    Report Status PENDING   Incomplete   CULTURE, BLOOD (ROUTINE X 2)     Status: Normal   Collection Time   08/12/11  6:00 PM      Component Value Range Status Comment   Specimen Description BLOOD ARM RIGHT   Final    Special Requests BOTTLES DRAWN AEROBIC ONLY [facility]   Final    Culture  Setup Time [id number]   Final    Culture NO GROWTH 5 DAYS   Final    Report Status 08/19/2011 FINAL   Final   FUNGUS CULTURE, BLOOD     Status: Normal (Preliminary result)   Collection Time   08/12/11  6:00 PM      Component Value Range Status Comment   Specimen Description BLOOD RIGHT ARM   Final    Special Requests BOTTLES DRAWN AEROBIC ONLY 6CC   Final    Culture NO FUNGUS ISOLATED;CULTURE IN PROGRESS FOR 7 DAYS   Final    Report Status PENDING   Incomplete   CULTURE, BLOOD (ROUTINE X 2)     Status: Normal   Collection Time   08/12/11  6:15 PM      Component Value Range Status Comment   Specimen Description BLOOD ARM LEFT   Final    Special Requests BOTTLES DRAWN AEROBIC AND ANAEROBIC 10CC   Final      Culture  Setup Time [id number]   Final    Culture NO GROWTH 5 DAYS   Final    Report Status 08/19/2011 FINAL   Final   CULTURE, BLOOD (ROUTINE X 2)     Status: Normal   Collection Time   08/13/11 12:40 AM      Component Value Range Status Comment   Specimen Description BLOOD RIGHT ARM   Final    Special Requests BOTTLES DRAWN AEROBIC AND ANAEROBIC [facility]   Final    Culture  Setup Time [id number]   Final    Culture  Final    Value: STAPHYLOCOCCUS SPECIES (COAGULASE NEGATIVE)     Note: THE SIGNIFICANCE OF ISOLATING THIS ORGANISM FROM A SINGLE SET OF BLOOD CULTURES WHEN MULTIPLE SETS ARE DRAWN IS UNCERTAIN. PLEASE NOTIFY THE MICROBIOLOGY DEPARTMENT WITHIN ONE WEEK IF SPECIATION AND SENSITIVITIES ARE REQUIRED.     Note: Gram Stain Report Called to,Read Back By and Verified With: NADINE WELLINGTON 08/14/2011 7:51AM YIMSU   Report Status 08/15/2011 FINAL   Final   CULTURE, BLOOD (ROUTINE X 2)     Status: Normal   Collection Time   08/13/11 12:43 AM      Component Value Range Status Comment   Specimen Description BLOOD RIGHT HAND   Final    Special Requests BOTTLES DRAWN AEROBIC AND ANAEROBIC 5CC   Final    Culture  Setup Time [id number]   Final    Culture     Final    Value: GRAM POSITIVE RODS     NO GROWTH 5 DAYS     Note: Gram Stain Report Called to,Read Back By and Verified With: Sallee Lange RN on 08/17/11 at 21:42 by Christie Nottingham   Report Status 08/19/2011 FINAL   Final   AFB CULTURE, BLOOD     Status: Normal (Preliminary result)   Collection Time   08/13/11 12:45 AM      Component Value Range Status Comment   Specimen Description BLOOD RIGHT HAND   Final    Special Requests 5CC   Final    Culture     Final    Value: CULTURE WILL BE EXAMINED FOR 6 WEEKS BEFORE ISSUING A FINAL REPORT   Report Status PENDING   Incomplete   WOUND CULTURE     Status: Normal   Collection Time   08/15/11  4:08 PM      Component Value Range Status Comment   Specimen Description WOUND   Final     Special Requests INFECTED PACING LEAD   Final    Gram Stain     Final    Value: NO WBC SEEN     NO SQUAMOUS EPITHELIAL CELLS SEEN     NO ORGANISMS SEEN   Culture FEW CANDIDA ALBICANS   Final    Report Status 08/18/2011 FINAL   Final   FUNGUS CULTURE W SMEAR     Status: Normal (Preliminary result)   Collection Time   08/15/11  4:08 PM      Component Value Range Status Comment   Specimen Description WOUND   Final    Special Requests INFECTED PACING LEAD   Final    Fungal Smear FEW HYPHAE   Final    Culture CULTURE IN PROGRESS FOR FOUR WEEKS   Final    Report Status PENDING   Incomplete   WOUND CULTURE     Status: Normal   Collection Time   08/15/11  4:10 PM      Component Value Range Status Comment   Specimen Description WOUND   Final    Special Requests ICD DEVICE POCKET SWAB REC'D   Final    Gram Stain     Final    Value: NO WBC SEEN     NO SQUAMOUS EPITHELIAL CELLS SEEN     NO ORGANISMS SEEN   Culture NO GROWTH 2 DAYS   Final    Report Status 08/18/2011 FINAL   Final   FUNGUS CULTURE W SMEAR     Status: Normal (Preliminary result)   Collection Time   08/15/11  4:10  PM      Component Value Range Status Comment   Specimen Description WOUND   Final    Special Requests ICD DEVICE POCKET SWAB REC'D   Final    Fungal Smear NO YEAST OR FUNGAL ELEMENTS SEEN   Final    Culture CULTURE IN PROGRESS FOR FOUR WEEKS   Final    Report Status PENDING   Incomplete   CULTURE, BLOOD (ROUTINE X 2)     Status: Normal (Preliminary result)   Collection Time   08/16/11  4:05 AM      Component Value Range Status Comment   Specimen Description BLOOD RIGHT WRIST   Final    Special Requests BOTTLES DRAWN AEROBIC ONLY 1CC   Final    Culture  Setup Time [id number]   Final    Culture     Final    Value:        BLOOD CULTURE RECEIVED NO GROWTH TO DATE CULTURE WILL BE HELD FOR 5 DAYS BEFORE ISSUING A FINAL NEGATIVE REPORT   Report Status PENDING   Incomplete   CULTURE, BLOOD (ROUTINE X 2)     Status:  Normal (Preliminary result)   Collection Time   08/16/11  4:15 AM      Component Value Range Status Comment   Specimen Description BLOOD RIGHT HAND   Final    Special Requests BOTTLES DRAWN AEROBIC ONLY 2CC   Final    Culture  Setup Time [id number]   Final    Culture     Final    Value:        BLOOD CULTURE RECEIVED NO GROWTH TO DATE CULTURE WILL BE HELD FOR 5 DAYS BEFORE ISSUING A FINAL NEGATIVE REPORT   Report Status PENDING   Incomplete    Studies/Results: Dg Chest 2 View  08/18/2011  *RADIOLOGY REPORT*  Clinical Data: Shortness of breath, congestion, possible pneumonia, CHF  CHEST - 2 VIEW  Comparison: 08/17/2011; 08/16/2011; 08/15/2011  Findings: Grossly unchanged enlarged cardiac silhouette and mediastinal contours post median sternotomy.  Lung volumes remain persistently reduced.  Interval consolidation of peripheral heterogeneous / consolidative opacities within the right mid lung. No definite pleural effusion or pneumothorax.  Unchanged bones.  IMPRESSION: Worsening consolidative opacities within the right mid lung worrisome for progression of infection.  Continued attention on follow-up is recommended.  Original Report Authenticated By: Waynard Reeds, M.D.   Ct Angio Chest W/cm &/or Wo Cm  08/18/2011  *RADIOLOGY REPORT*  Clinical Data: Fevers, cough, and endocarditis.  CT ANGIOGRAPHY CHEST  Technique:  Multidetector CT imaging of the chest using the standard protocol during bolus administration of intravenous contrast. Multiplanar reconstructed images including MIPs were obtained and reviewed to evaluate the vascular anatomy.  Contrast: 80mL OMNIPAQUE IOHEXOL 350 MG/ML SOLN  Comparison: Chest x-ray dated 08/18/2011 and chest CT dated 07/18/2011  Findings: The patient has a massive right pulmonary embolus completely obstructing the pulmonary arteries to the right lower lobe.  There is still some flow to the right middle lobe and in and to the right upper lobe.  There are multiple smaller  pulmonary emboli in the left lung primarily in the left upper lobe.  There is a moderate right pleural effusion.  There is an interstitial infiltrate in the right lower lobe which may be secondary to pulmonary infarction.  There is a small patchy infiltrate in the superior segment of the left lower lobe which could be infarction due to an embolus.  There is a new irregular  air containing fluid pocket possible hematoma at the site of the previous pacemaker generator.  This may represent a postoperative hematoma with postoperative air and given the patient's clinical history this could represent an abscess.  Heart is at the upper limits of normal in size.  Evidence of prior CABG.  There are a few small reactive mediastinal lymph nodes, more prominent than on the prior study.  Visualized portion of the upper abdomen demonstrates a few small gallstones.  IMPRESSION:  1.  Massive right pulmonary embolus with probable infarction in the right lower lobe and possibly within the lateral segment of the right middle lobe. 2.  Moderate right pleural effusion. 3.  Multiple smaller emboli in the left lung with a either a patchy pneumonia or possible pulmonary infarct in the superior segment of the left lower lobe. 4.  Cholelithiasis.  Critical Value/emergent results were called by telephone at the time of interpretation on 08/18/2011  at 7:00 p.m.  to  the patient's nurse, Efraim Kaufmann, who verbally acknowledged these results.  Original Report Authenticated By: Gwynn Burly, M.D.   Medications:  I have reviewed the patient's current medications. Prior to Admission:  Prescriptions prior to admission  Medication Sig Dispense Refill  . Ascorbic Acid (VITAMIN C) 100 MG tablet Take 100 mg by mouth daily.      . calcium carbonate (TUMS - DOSED IN MG ELEMENTAL CALCIUM) 500 MG chewable tablet Chew 1 tablet by mouth 3 (three) times daily.      . diphenhydramine-acetaminophen (TYLENOL PM) 25-500 MG TABS Take 1 tablet by mouth at  bedtime as needed. For pain      . esomeprazole (NEXIUM) 40 MG capsule Take 40 mg by mouth 2 (two) times daily.       . famotidine (PEPCID) 10 MG tablet Take 10 mg by mouth 2 (two) times daily.      . Ibuprofen 200 MG CAPS Take 600-800 mg by mouth every 6 (six) hours as needed. Depending on fever      . metoprolol (LOPRESSOR) 50 MG tablet Take 25 mg by mouth daily. Takes in am      . Multiple Vitamins-Minerals (MULTIVITAMIN) tablet Take 1 tablet by mouth daily with breakfast.   30 tablet    . Phenylephrine-Acetaminophen 5-325 MG TABS Take 1 tablet by mouth every 4 (four) hours as needed. For sinus headache      . traMADol (ULTRAM) 50 MG tablet Take 1 tablet (50 mg total) by mouth every 6 (six) hours as needed for pain.  20 tablet  0  . DISCONTD: acetaminophen (TYLENOL) 500 MG tablet Take 500 mg by mouth every 6 (six) hours as needed. For pain.      [name] DISCONTD: Aspirin-Salicylamide-Caffeine (BC HEADACHE POWDER PO) Take 1 Package by mouth daily as needed. For headaches.      [name] DISCONTD: Flaxseed, Linseed, 1000 MG CAPS Take 1 capsule by mouth daily.       Anti-infectives     Start     Dose/Rate Route Frequency Ordered Stop   08/15/11 2200  piperacillin-tazobactam (ZOSYN) IVPB 3.375 g       3.375 g 12.5 mL/hr over 240 Minutes Intravenous 3 times per day 08/15/11 1758     08/15/11 1830   vancomycin (VANCOCIN) 1,250 mg in sodium chloride 0.9 % 250 mL IVPB  Status:  Discontinued        1,250 mg 166.7 mL/hr over 90 Minutes Intravenous Every 12 hours 08/15/11 1758 08/18/11 1452   08/15/11 1535  gentamycin 80 mg in 0.9% normal saline 250 mL irrigation  Status:  Discontinued          As needed 08/15/11 1535 08/15/11 1608   08/15/11 1515   gentamycin 80 mg in 0.9% normal saline 250 mL irrigation  Status:  Discontinued        1 application Irrigation  Once 08/15/11 1507 08/15/11 1509   08/15/11 1515   gentamicin (GARAMYCIN) 80 mg in sodium chloride irrigation 0.9 % 500 mL irrigation          Irrigation Once 08/15/11 1510     08/14/11 1500   gentamicin (GARAMYCIN) 80 mg in sodium chloride irrigation 0.9 % 500 mL irrigation  Status:  Discontinued        80 mg Irrigation On call 08/14/11 1412 08/15/11 1819   08/14/11 1500   ceFAZolin (ANCEF) IVPB 2 g/50 mL premix        2 g 100 mL/hr over 30 Minutes Intravenous On call 08/14/11 1412 08/15/11 1430   08/13/11 1200   micafungin (MYCAMINE) 150 mg in sodium chloride 0.9 % 100 mL IVPB     Comments: Wait until fungal blood cultures are drawn before starting      150 mg 100 mL/hr over 1 Hours Intravenous Daily 08/13/11 0948     08/13/11 1000   fluconazole (DIFLUCAN) tablet 100 mg  Status:  Discontinued        100 mg Oral Daily 08/12/11 1125 08/13/11 0948   08/12/11 1200   fluconazole (DIFLUCAN) tablet 200 mg        200 mg Oral Daily 08/12/11 1125 08/12/11 1426         Scheduled Meds:   . antiseptic oral rinse  15 mL Mouth Rinse QID  . aspirin EC  81 mg Oral Daily  . chlorhexidine  15 mL Mouth/Throat BID  . feeding supplement  237 mL Oral BID BM  . gentamicin irrigation   Irrigation Once  . metoprolol  25 mg Oral BID  . micafungin [facility]) IV  150 mg Intravenous Daily  . mulitivitamin with minerals  1 tablet Oral Daily  . piperacillin-tazobactam (ZOSYN)  IV  3.375 g Intravenous Q8H  . polyethylene glycol  17 g Oral Daily  . DISCONTD: vancomycin  1,250 mg Intravenous Q12H   Continuous Infusions:   . sodium chloride 20 mL/hr at 08/16/11 2011   PRN Meds:.acetaminophen, iohexol, metoprolol, ondansetron (ZOFRAN) IV, oxyCODONE-acetaminophen, zolpidem Assessment/Plan:  FUO/C. albicans ICD endocarditis - Tmax 98.9 overnight. First night since admission with no fever both subjective and objective. He is s/p ICD removal on 08/15/11. 3 cm mass embolized to right PA causing massive PE. He is s/p ICU 5/16-5/18 2/2 hypotension/tachycardia/desats/respi failure requiring ET intubation and pressors on 5/16-5/17. FUO most likely caused by  his Candida which is being aggressively treated. Negative labs to date: uric acid, HIV, RPR, Cryptococcal Ag, CT hip, cyclic citrul Ab IgG, Quantiferon gold, Chlamydia, Brucellosis, HLA B27 Pending tests: Q fever Cultures positive for: GPR (5/14), C. albicans (5/16), CoagNeg Staph species (5/14) -Appreciate ID, GI, cardiology assistance in the care and management of this patient -continue micafungin (5/14>>) x 6 weeks, then will transition to fluconazole -insert PICC today (benefits outweigh risks in this setting) -continue zosyn (5/16>>) -vanc has been discontinued (5/16-5/19) -opthomology consult for endophthalmitis evaluation and possible intravitreous amphotericin B.  Pulmonary infiltrate - CT angio chest (5/19) reveals massive right PE in RLL, moderate R pleural effusion, possible right interstitial infiltrate 2/2 infarction, and small patchy infiltrate  in superior segment of LLL (infarction 2/2 embolus). Findings are related to embolization of ICD mass during removal. -patient in no distress, no dyspnea  Monoclonal gammopathy - Admission CMP revealed protein gap of 5 (total protein - albumin). UPEP revealed increased kappa chains (28.2) in urine (kappa/lambda ratio = 20.58). 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 His calcium remains WNL (8.5) and SCr =0.75 and no s/s of aching bone pains. IFE revealed increased IgA (537) and increased IgM (723). These could be increased acutely for his esophageal disease (IgA in mucosa) and the infection in his heart (IgM acute response to C. albicans and possible coag neg staph in heart vegetation that has now embolized to R PA). -Appreciate hematology consult in the management of this patient -further workup was deferred in lieu of his other problems, however further evaluation will resume with a bone scan today  GERD/Esophageal stricture - He is s/p esophageal dilation on 08/12/11. He has had a history of esophageal  dilation s/p strictures and candidiasis and poor po intake.  -Regular diet, well tolerated  Ischemic cardiomyopathy - Stable. 2D echo 08/15/11 reveals LVEF = 25% and diffuse hypokinesis. Mild-moderate MVR, mildly dilated LA, mild TVR, and paradoxical septal motion. -Appreciate cardiology recommendations.  -continue metoprolol BID  -continue ASA  Weight loss, unintentional - Stable. He has been gaining weight slowly since admission and his regular diet. His TSH is WNL (1.523). This is likely 2/2 his underlying principal problem but also his esophageal stricture is a large component of this in the past.  -Appreciate GI and nutrition assistance in management of this patient   Normocytic anemia - Hgb =9.1, MCV =82.5. This is acute and has developed since March when hemoglobin was 14. Anemia panel revealing: Iron = 21, Sat ratio = 9, TIBC =243, Ferritin = 597, Retic count =1.9, FOBT negative. Smear revealed mild anemia with normal morphology. Still consistent with AOCD given that TIBC is normal (elevated in IDA) and ferritin high although could still be a component of acute phase reactant. Pt had last colonoscopy in 2012 showing sigmoid and descending diverticula (but no recurrent polyps - last noted to have hyperplastic polyps in 2001).   Thrombocytopenia - Likely related to underlying gammopathy. Smear also reveals thrombocytopenia.  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 Multiple causes for this include extension of Candida infection vs. Vitamin B2 deficiency (less likely) vs. Iron deficiency anemia (AOCD more likely at this point), all of which are treatable conditions in this patient. As final diagnosis nears closer, treatment options for this will narrow as well.  -likely will resolve as his esophageal  candida resolves  completely  Constipation - He has had spells of decreased po intake during his spells of dysphagia. He has been on a regular diet the last several days with no problems of constipation anymore.   -resolved  -continue miralax as needed  CAD h/o CABG x 4 (2003) - Stable. CE negative x 3 on admission.  -continue metoprolol  -continue ASA  -no statin 2/2 allergy   DVT ppx - SCD's  Disposition - He will receive a PICC today in anticipation of home health micafungin at discharge. Also, PT will see him to evaluate needs/placement at discharge.   LOS: 11 days   This  is a Psychologist, occupational Note.  The care of the patient was discussed with Dr. Quentin Ore and the assessment and plan formulated with their assistance.  Please see their attached note for official documentation of the daily encounter.  Lewie Chamber 08/19/2011, 10:38 AM     Resident Co-sign Daily Note: I have seen the patient and reviewed the daily progress note by Lewie Chamber and discussed the care of the patient with them.  See below for documentation of my findings, assessment, and plans.  Subjective: Feeling poorly today, but improved from yesterday. No subjective fevers. States over the past few years he has had floaters in his vision. This has increased over the past 2 months as has his visual acuity over the past month. RN tells me IV infiltrated yesterday.  Appetite is good. Does not like vanilla Ensure. Overall feels very weak and worn out.   Objective: Vital signs in last 24 hours: Filed Vitals:   08/18/11 2100 08/19/11 0500 08/19/11 0948 08/19/11 1326  BP: 134/76 132/70 126/77 116/78  Pulse: 97 107 101 87  Temp: 98.9 F (37.2 C) 99.6 F (37.6 C)  98.5 F (36.9 C)  TempSrc:      Resp: 16 18  17   Height:      Weight:  205 lb 0.4 oz (93 kg)    SpO2: 96% 93%  97%   Physical Exam: General: resting in bed, very tired appearing Cardiac: RRR, no rubs, murmurs or gallops Pulm: moving normal volumes of air,  right-sided inspiratory crackles, no egophony Abd: soft, nontender, nondistended, BS present Ext: warm and well perfused, no pedal edema. Left arm is swollen, red and tender.  Neuro: alert and oriented X3, cranial nerves II-XII grossly intact  Lab Results: Reviewed and documented in Electronic Record Micro Results: Reviewed and documented in Electronic Record Studies/Results: Reviewed and documented in Electronic Record Medications: I have reviewed the patient's current medications. Scheduled Meds:    . antiseptic oral rinse  15 mL Mouth Rinse QID  . aspirin EC  81 mg Oral Daily  . chlorhexidine  15 mL Mouth/Throat BID  . feeding supplement  237 mL Oral BID BM  . gentamicin irrigation   Irrigation Once  . metoprolol  25 mg Oral BID  . micafungin [facility]) IV  150 mg Intravenous Daily  . mulitivitamin with minerals  1 tablet Oral Daily  . pantoprazole  40 mg Oral Q1200  . piperacillin-tazobactam (ZOSYN)  IV  3.375 g Intravenous Q8H  . polyethylene glycol  17 g Oral Daily   Continuous Infusions:   . 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 He was placed on micafungin on (5/14). 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 He was transferred to the ICU from the 16th through 18th requiring intubation for one day Carlynn Purl at her depression. Now afebrile for approximately 24 hours -- Continue micafungin for 6 weeks then will transfer to by mouth fluconazole -- Will insert PICC line today to receive long-term micafungin -- Will continue vancomycin in the setting of  massive PE and lung infarction -- Called for  ophthalmology consult 08/19/10 to rule out candida -- Will obtain surveillance TTE on Friday  morning  -- Will obtain baseline B-D glucan/fungitell assay  -- Appreciate ID, GI, cardiology assistance in the care and management of this patient  2) Monoclonal gammopathy - Admission CMP revealed protein gap of 5. UPEP = increased kappa chains (28.2) in urine with kappa/lambda ratio of 20.58). 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 Serum IFE revealed increased IgA (537) and increased IgM (723).Overall besides immunosuppression anemia and thrombocytopenia no overt clinical signs of multiple myeloma. He has no renal failure, hypercalcemia, or lytic bone lesions seen on imaging so far. -- Will obtain a skeletal survey today to evaluate for skeletal lytic lesions -- Will touch base with hematology tomorrow  3) GERD/Esophageal stricture - He is s/p esophageal dilation on 08/12/11. Esophageal candidiasis may have been secondary to repeated dilations. The patient also always has a sugar lozenge in his mouth which could predispose to fungal infection. No difficulty with eating after EGD and dilation.-- Continue pantoprazole -- Regular diet, well tolerated  4) Ischemic cardiomyopathy  And CAD h/o CABG x 4 (2003) - Stable. CE negative x 3 on admission.  2D echo 08/15/11 reveals LVEF = 25% and diffuse hypokinesis. Mild-moderate MVR, mildly dilated LA, mild TVR, and paradoxical septal motion. -- Appreciate cardiology recommendations.  -- continue metoprolol BID  -- continue ASA -- no statin 2/2 allergy   5) Weight loss, unintentional - Stable.  Weight loss was likely secondary to decreased by mouth intake and underlying infection. Now eating well. -- Continue strawberry flavored Ensure -Appreciate GI and nutrition assistance in management of this patient   6) Normocytic anemia - Hgb =9.1, MCV =82.5. This is acute and has developed  since March when hemoglobin was 14. Anemia panel revealing: Iron = 21, Sat ratio = 9, TIBC =243, Ferritin = 597, Retic count =1.9, FOBT negative. Smear revealed mild anemia with normal morphology. Still consistent with AOCD given that TIBC is normal (elevated in IDA) and ferritin high although could still be a component of acute phase reactant. Pt had last colonoscopy in 2012 showing sigmoid and descending diverticula (but no recurrent polyps - last noted to have hyperplastic polyps in 2001).   7) Thrombocytopenia - Likely related to underlying gammopathy. Smear also reveals thrombocytopenia. Platelets ranging from 100s to 120s. Will put on prophylactic dose Lovenox for DVT prophylaxis.   8) Debility - will obtain PT OT consult  9) disposition - Will obtain PT OT consult. Ultimately patient will need to be stable from a cardiopulmonary standpoint prior to discharge. We are waiting ophthalmology consult  DVT ppx - SCDS, but will change to  lovenox if eye exam shows no candida   LOS: 11 days   Quynn Vilchis 08/19/2011, 2:25 PM

## 2011-08-19 NOTE — Progress Notes (Signed)
INFECTIOUS DISEASE PROGRESS NOTE  ID: [name] is a 66 y.o. male with  Principal Problem:  *FUO (fever of unknown origin) Active Problems:  CARDIOMYOPATHY, ISCHEMIC  CONGESTIVE HEART FAILURE, LEFT  ICD - IN SITU  CAD (coronary artery disease)  Weight loss, unintentional  Normocytic anemia  Esophageal stricture  Thrombocytopenia  Multiple pulmonary nodules  Sepsis  Rigors  Septic embolism  Acute respiratory failure with hypoxia  Endocarditis, infective, acute/subacute in other disease  Subjective: Without complaints. Family appreciative of IM service explaining his illness.   Abtx:  Anti-infectives     Start     Dose/Rate Route Frequency Ordered Stop   08/15/11 2200  piperacillin-tazobactam (ZOSYN) IVPB 3.375 g       3.375 g 12.5 mL/hr over 240 Minutes Intravenous 3 times per day 08/15/11 1758     08/15/11 1830   vancomycin (VANCOCIN) 1,250 mg in sodium chloride 0.9 % 250 mL IVPB  Status:  Discontinued        1,250 mg 166.7 mL/hr over 90 Minutes Intravenous Every 12 hours 08/15/11 1758 08/18/11 1452   08/15/11 1535   gentamycin 80 mg in 0.9% normal saline 250 mL irrigation  Status:  Discontinued          As needed 08/15/11 1535 08/15/11 1608   08/15/11 1515   gentamycin 80 mg in 0.9% normal saline 250 mL irrigation  Status:  Discontinued        1 application Irrigation  Once 08/15/11 1507 08/15/11 1509   08/15/11 1515   gentamicin (GARAMYCIN) 80 mg in sodium chloride irrigation 0.9 % 500 mL irrigation         Irrigation Once 08/15/11 1510     08/14/11 1500   gentamicin (GARAMYCIN) 80 mg in sodium chloride irrigation 0.9 % 500 mL irrigation  Status:  Discontinued        80 mg Irrigation On call 08/14/11 1412 08/15/11 1819   08/14/11 1500   ceFAZolin (ANCEF) IVPB 2 g/50 mL premix        2 g 100 mL/hr over 30 Minutes Intravenous On call 08/14/11 1412 08/15/11 1430   08/13/11 1200   micafungin (MYCAMINE) 150 mg in sodium chloride 0.9 % 100 mL IVPB     Comments: Wait until fungal blood cultures are drawn before starting      150 mg 100 mL/hr over 1 Hours Intravenous Daily 08/13/11 0948     08/13/11 1000   fluconazole (DIFLUCAN) tablet 100 mg  Status:  Discontinued        100 mg Oral Daily 08/12/11 1125 08/13/11 0948   08/12/11 1200   fluconazole (DIFLUCAN) tablet 200 mg        200 mg Oral Daily 08/12/11 1125 08/12/11 1426          Medications:  Scheduled:   . antiseptic oral rinse  15 mL Mouth Rinse QID  . aspirin EC  81 mg Oral Daily  . chlorhexidine  15 mL Mouth/Throat BID  . feeding supplement  237 mL Oral BID BM  . gentamicin irrigation   Irrigation Once  . metoprolol  25 mg Oral BID  . micafungin [facility]) IV  150 mg Intravenous Daily  . mulitivitamin with minerals  1 tablet Oral Daily  . pantoprazole  40 mg Oral Q1200  . piperacillin-tazobactam (ZOSYN)  IV  3.375 g Intravenous Q8H  . polyethylene glycol  17 g Oral Daily    Objective: Vital signs in last 24 hours: Temp:  [98.5 F (36.9  C)-99.6 F (37.6 C)] 98.5 F (36.9 C) (05/20 1326) Pulse Rate:  [87-107] 87  (05/20 1326) Resp:  [16-18] 17  (05/20 1326) BP: (116-134)/(70-78) 116/78 mmHg (05/20 1326) SpO2:  [93 %-97 %] 97 % (05/20 1326) Weight:  [93 kg (205 lb 0.4 oz)] 93 kg (205 lb 0.4 oz) (05/20 0500)   General appearance: alert, cooperative and no distress Resp: clear to auscultation bilaterally Chest wall: ICD wound site is clean, non-tender, steri-strips in place.   Cardio: regular rate and rhythm and systolic murmur: systolic ejection 2/6, crescendo at 2nd left intercostal space GI: normal findings: bowel sounds normal and soft, non-tender  Lab Results  Basename 08/18/11 0515 08/17/11 0415  WBC 10.6* 8.1  HGB 9.1* 8.3*  HCT 28.3* 25.8*  NA 132* 131*  K 4.1 4.3  CL 99 100  CO2 23 23  BUN 12 20  CREATININE 0.75 1.08  GLU -- --   Liver Panel No results found for this basename:  PROT:2,ALBUMIN:2,AST:2,ALT:2,ALKPHOS:2,BILITOT:2,BILIDIR:2,IBILI:2 in the last 72 hours Sedimentation Rate No results found for this basename: ESRSEDRATE in the last 72 hours C-Reactive Protein No results found for this basename: CRP:2 in the last 72 hours  Microbiology: Recent Results (from the past 240 hour(s))  AFB CULTURE, BLOOD     Status: Normal (Preliminary result)   Collection Time   08/09/11  9:00 PM      Component Value Range Status Comment   Specimen Description BLOOD ARM RIGHT   Final    Special Requests 5CC AFB   Final    Culture     Final    Value: CULTURE WILL BE EXAMINED FOR 6 WEEKS BEFORE ISSUING A FINAL REPORT   Report Status PENDING   Incomplete   FUNGUS CULTURE, BLOOD     Status: Normal (Preliminary result)   Collection Time   08/12/11  3:18 PM      Component Value Range Status Comment   Specimen Description BLOOD LEFT ARM   Final    Special Requests BOTTLES DRAWN AEROBIC AND ANAEROBIC 10CC   Final    Culture NO FUNGUS ISOLATED;CULTURE IN PROGRESS FOR 7 DAYS   Final    Report Status PENDING   Incomplete   CULTURE, BLOOD (ROUTINE X 2)     Status: Normal   Collection Time   08/12/11  6:00 PM      Component Value Range Status Comment   Specimen Description BLOOD ARM RIGHT   Final    Special Requests BOTTLES DRAWN AEROBIC ONLY [facility]   Final    Culture  Setup Time [id number]   Final    Culture NO GROWTH 5 DAYS   Final    Report Status 08/19/2011 FINAL   Final   FUNGUS CULTURE, BLOOD     Status: Normal (Preliminary result)   Collection Time   08/12/11  6:00 PM      Component Value Range Status Comment   Specimen Description BLOOD RIGHT ARM   Final    Special Requests BOTTLES DRAWN AEROBIC ONLY 6CC   Final    Culture NO FUNGUS ISOLATED;CULTURE IN PROGRESS FOR 7 DAYS   Final    Report Status PENDING   Incomplete   CULTURE, BLOOD (ROUTINE X 2)     Status: Normal   Collection Time   08/12/11  6:15 PM      Component Value Range Status Comment   Specimen Description  BLOOD ARM LEFT   Final    Special Requests BOTTLES DRAWN AEROBIC AND ANAEROBIC  10CC   Final    Culture  Setup Time [id number]   Final    Culture NO GROWTH 5 DAYS   Final    Report Status 08/19/2011 FINAL   Final   CULTURE, BLOOD (ROUTINE X 2)     Status: Normal   Collection Time   08/13/11 12:40 AM      Component Value Range Status Comment   Specimen Description BLOOD RIGHT ARM   Final    Special Requests BOTTLES DRAWN AEROBIC AND ANAEROBIC 5CC   Final    Culture  Setup Time [id number]   Final    Culture     Final    Value: STAPHYLOCOCCUS SPECIES (COAGULASE NEGATIVE)     Note: THE SIGNIFICANCE OF ISOLATING THIS ORGANISM FROM A SINGLE SET OF BLOOD CULTURES WHEN MULTIPLE SETS ARE DRAWN IS UNCERTAIN. PLEASE NOTIFY THE MICROBIOLOGY DEPARTMENT WITHIN ONE WEEK IF SPECIATION AND SENSITIVITIES ARE REQUIRED.     Note: Gram Stain Report Called to,Read Back By and Verified With: NADINE WELLINGTON 08/14/2011 7:51AM YIMSU   Report Status 08/15/2011 FINAL   Final   CULTURE, BLOOD (ROUTINE X 2)     Status: Normal   Collection Time   08/13/11 12:43 AM      Component Value Range Status Comment   Specimen Description BLOOD RIGHT HAND   Final    Special Requests BOTTLES DRAWN AEROBIC AND ANAEROBIC 5CC   Final    Culture  Setup Time [id number]   Final    Culture     Final    Value: GRAM POSITIVE RODS     NO GROWTH 5 DAYS     Note: Gram Stain Report Called to,Read Back By and Verified With: Sallee Lange RN on 08/17/11 at 21:42 by Christie Nottingham   Report Status 08/19/2011 FINAL   Final   AFB CULTURE, BLOOD     Status: Normal (Preliminary result)   Collection Time   08/13/11 12:45 AM      Component Value Range Status Comment   Specimen Description BLOOD RIGHT HAND   Final    Special Requests 5CC   Final    Culture     Final    Value: CULTURE WILL BE EXAMINED FOR 6 WEEKS BEFORE ISSUING A FINAL REPORT   Report Status PENDING   Incomplete   WOUND CULTURE     Status: Normal   Collection Time   08/15/11   4:08 PM      Component Value Range Status Comment   Specimen Description WOUND   Final    Special Requests INFECTED PACING LEAD   Final    Gram Stain     Final    Value: NO WBC SEEN     NO SQUAMOUS EPITHELIAL CELLS SEEN     NO ORGANISMS SEEN   Culture FEW CANDIDA ALBICANS   Final    Report Status 08/18/2011 FINAL   Final   FUNGUS CULTURE W SMEAR     Status: Normal (Preliminary result)   Collection Time   08/15/11  4:08 PM      Component Value Range Status Comment   Specimen Description WOUND   Final    Special Requests INFECTED PACING LEAD   Final    Fungal Smear FEW HYPHAE   Final    Culture YEAST CONSISTENT WITH CANDIDA SPECIES   Final    Report Status PENDING   Incomplete   WOUND CULTURE     Status: Normal   Collection Time   08/15/11  4:10 PM      Component Value Range Status Comment   Specimen Description WOUND   Final    Special Requests ICD DEVICE POCKET SWAB REC'D   Final    Gram Stain     Final    Value: NO WBC SEEN     NO SQUAMOUS EPITHELIAL CELLS SEEN     NO ORGANISMS SEEN   Culture NO GROWTH 2 DAYS   Final    Report Status 08/18/2011 FINAL   Final   FUNGUS CULTURE W SMEAR     Status: Normal (Preliminary result)   Collection Time   08/15/11  4:10 PM      Component Value Range Status Comment   Specimen Description WOUND   Final    Special Requests ICD DEVICE POCKET SWAB REC'D   Final    Fungal Smear NO YEAST OR FUNGAL ELEMENTS SEEN   Final    Culture CULTURE IN PROGRESS FOR FOUR WEEKS   Final    Report Status PENDING   Incomplete   CULTURE, BLOOD (ROUTINE X 2)     Status: Normal (Preliminary result)   Collection Time   08/16/11  4:05 AM      Component Value Range Status Comment   Specimen Description BLOOD RIGHT WRIST   Final    Special Requests BOTTLES DRAWN AEROBIC ONLY 1CC   Final    Culture  Setup Time [id number]   Final    Culture     Final    Value:        BLOOD CULTURE RECEIVED NO GROWTH TO DATE CULTURE WILL BE HELD FOR 5 DAYS BEFORE ISSUING A FINAL  NEGATIVE REPORT   Report Status PENDING   Incomplete   CULTURE, BLOOD (ROUTINE X 2)     Status: Normal (Preliminary result)   Collection Time   08/16/11  4:15 AM      Component Value Range Status Comment   Specimen Description BLOOD RIGHT HAND   Final    Special Requests BOTTLES DRAWN AEROBIC ONLY 2CC   Final    Culture  Setup Time [id number]   Final    Culture     Final    Value:        BLOOD CULTURE RECEIVED NO GROWTH TO DATE CULTURE WILL BE HELD FOR 5 DAYS BEFORE ISSUING A FINAL NEGATIVE REPORT   Report Status PENDING   Incomplete     Studies/Results: Dg Chest 2 View  08/18/2011  *RADIOLOGY REPORT*  Clinical Data: Shortness of breath, congestion, possible pneumonia, CHF  CHEST - 2 VIEW  Comparison: 08/17/2011; 08/16/2011; 08/15/2011  Findings: Grossly unchanged enlarged cardiac silhouette and mediastinal contours post median sternotomy.  Lung volumes remain persistently reduced.  Interval consolidation of peripheral heterogeneous / consolidative opacities within the right mid lung. No definite pleural effusion or pneumothorax.  Unchanged bones.  IMPRESSION: Worsening consolidative opacities within the right mid lung worrisome for progression of infection.  Continued attention on follow-up is recommended.  Original Report Authenticated By: Waynard Reeds, M.D.   Ct Angio Chest W/cm &/or Wo Cm  08/18/2011  *RADIOLOGY REPORT*  Clinical Data: Fevers, cough, and endocarditis.  CT ANGIOGRAPHY CHEST  Technique:  Multidetector CT imaging of the chest using the standard protocol during bolus administration of intravenous contrast. Multiplanar reconstructed images including MIPs were obtained and reviewed to evaluate the vascular anatomy.  Contrast: 80mL OMNIPAQUE IOHEXOL 350 MG/ML SOLN  Comparison: Chest x-ray dated 08/18/2011 and chest CT dated 07/18/2011  Findings: The  patient has a massive right pulmonary embolus completely obstructing the pulmonary arteries to the right lower lobe.  There is still  some flow to the right middle lobe and in and to the right upper lobe.  There are multiple smaller pulmonary emboli in the left lung primarily in the left upper lobe.  There is a moderate right pleural effusion.  There is an interstitial infiltrate in the right lower lobe which may be secondary to pulmonary infarction.  There is a small patchy infiltrate in the superior segment of the left lower lobe which could be infarction due to an embolus.  There is a new irregular air containing fluid pocket possible hematoma at the site of the previous pacemaker generator.  This may represent a postoperative hematoma with postoperative air and given the patient's clinical history this could represent an abscess.  Heart is at the upper limits of normal in size.  Evidence of prior CABG.  There are a few small reactive mediastinal lymph nodes, more prominent than on the prior study.  Visualized portion of the upper abdomen demonstrates a few small gallstones.  IMPRESSION:  1.  Massive right pulmonary embolus with probable infarction in the right lower lobe and possibly within the lateral segment of the right middle lobe. 2.  Moderate right pleural effusion. 3.  Multiple smaller emboli in the left lung with a either a patchy pneumonia or possible pulmonary infarct in the superior segment of the left lower lobe. 4.  Cholelithiasis.  Critical Value/emergent results were called by telephone at the time of interpretation on 08/18/2011  at 7:00 p.m.  to  the patient's nurse, Efraim Kaufmann, who verbally acknowledged these results.  Original Report Authenticated By: Gwynn Burly, M.D.   Dg Bone Survey Met  08/19/2011  *RADIOLOGY REPORT*  Clinical Data: M spike on SPEP and immune suppression, evaluate for lytic lesions of multiple myeloma  METASTATIC BONE SURVEY  Comparison: Chest CT - 08/18/2011; CT abdomen pelvis - 07/18/2011; right hip CT - 08/10/2011  Findings:  There is a possible 1.2 x 2.2 cm lucency within the vertex of the  calvarium.  Otherwise, no discrete lytic lesions are identified.  Degenerative change within the lumbar spine, worse at the L1 - L2 and L4 - L5. Multilevel cervical spine degenerative change.  Post mediastinotomy and CABG.  Surgical clips overlying the medial aspect of the right upper thigh. Vascular calcifications.  IMPRESSION:  Possible 2.2 cm lucency within the vertex of the calvarium. Further evaluation with head CT of brain MRI may be performed as clinically indicated. Otherwise, no additional lucent lesions identified.  Original Report Authenticated By: Waynard Reeds, M.D.     Assessment/Plan: Infective Endocarditis, ICD,  Candida seen on lead (08-15-11) Massive R pulmonary embolus Day 8 antifungal therapy (micafungin)  Day 5 zosyn optho eval pending. Sensi/ID of candida pending.  If he remains afebrile, consider stopping zosyn.     Johny Sax Infectious Diseases [phone] 08/19/2011, 4:13 PM   LOS: 11 days

## 2011-08-19 NOTE — Progress Notes (Signed)
Internal Medicine Attending  Date: 08/19/2011  Patient name: [name] Medical record number: [id number] Date of birth: 14-May-1945 Age: 66 y.o. Gender: male  I saw and evaluated the patient. I reviewed the resident's note by Dr. Candy Sledge and I agree with the resident's findings and plans as documented in his progress note.  [name] is sleepy after receiving his pain medications. He is currently without any complaints other than the mild pain. He does admit to still being short of breath but this is improving. Workup is still undergoing with regards to identification of the Candida, extent of the shower awaiting the ophthalmologic examination, and placement of a PICC line for long term IV antifungal therapy prior to transitioning to oral therapy.

## 2011-08-19 NOTE — Progress Notes (Signed)
MD on call paged and questioned about need for anticoagualtion due to +CT angio .[name].MD states they are aware of findings and no Heparin at this time.  Pt is resting comfortably/no resp distress noted. Will cont to monitor. Dierdre Highman

## 2011-08-20 ENCOUNTER — Inpatient Hospital Stay (HOSPITAL_COMMUNITY): Payer: Medicare Other

## 2011-08-20 ENCOUNTER — Ambulatory Visit: Payer: Medicare Other | Admitting: Internal Medicine

## 2011-08-20 DIAGNOSIS — I509 Heart failure, unspecified: Secondary | ICD-10-CM

## 2011-08-20 DIAGNOSIS — B376 Candidal endocarditis: Secondary | ICD-10-CM | POA: Diagnosis present

## 2011-08-20 DIAGNOSIS — I5022 Chronic systolic (congestive) heart failure: Secondary | ICD-10-CM

## 2011-08-20 LAB — CBC
Hemoglobin: 8.4 g/dL — ABNORMAL LOW (ref 13.0–17.0)
MCH: 26.7 pg (ref 26.0–34.0)
MCHC: 31.7 g/dL (ref 30.0–36.0)
Platelets: 201 10*3/uL (ref 150–400)

## 2011-08-20 LAB — DIFFERENTIAL
Basophils Relative: 1 % (ref 0–1)
Eosinophils Absolute: 0.1 10*3/uL (ref 0.0–0.7)
Monocytes Relative: 7 % (ref 3–12)
Neutrophils Relative %: 79 % — ABNORMAL HIGH (ref 43–77)

## 2011-08-20 LAB — COMPREHENSIVE METABOLIC PANEL
ALT: 17 U/L (ref 0–53)
AST: 27 U/L (ref 0–37)
Alkaline Phosphatase: 196 U/L — ABNORMAL HIGH (ref 39–117)
CO2: 28 mEq/L (ref 19–32)
Chloride: 98 mEq/L (ref 96–112)
GFR calc non Af Amer: >90 mL/min (ref >90)
Potassium: 4 mEq/L (ref 3.5–5.1)
Sodium: 134 mEq/L — ABNORMAL LOW (ref 135–145)
Total Bilirubin: 0.4 mg/dL (ref 0.3–1.2)

## 2011-08-20 LAB — CULTURE, BLOOD (ROUTINE X 2): Culture  Setup Time: 201305140343

## 2011-08-20 LAB — GLUCOSE, CAPILLARY: Glucose-Capillary: 126 mg/dL — ABNORMAL HIGH (ref 70–99)

## 2011-08-20 LAB — MAGNESIUM: Magnesium: 1.9 mg/dL (ref 1.5–2.5)

## 2011-08-20 MED ORDER — LORAZEPAM 2 MG/ML IJ SOLN
1.0000 mg | Freq: Once | INTRAMUSCULAR | Status: AC
  Administered 2011-08-20: 1 mg via INTRAVENOUS
  Filled 2011-08-20: qty 1

## 2011-08-20 MED ORDER — FUROSEMIDE 40 MG PO TABS
40.0000 mg | ORAL_TABLET | Freq: Every day | ORAL | Status: DC
  Administered 2011-08-21 – 2011-08-23 (×3): 40 mg via ORAL
  Filled 2011-08-20 (×3): qty 1

## 2011-08-20 MED ORDER — OXYCODONE-ACETAMINOPHEN 5-325 MG PO TABS
1.0000 | ORAL_TABLET | Freq: Four times a day (QID) | ORAL | Status: DC | PRN
  Administered 2011-08-20 – 2011-08-23 (×9): 1 via ORAL
  Filled 2011-08-20 (×9): qty 1

## 2011-08-20 MED ORDER — ENOXAPARIN SODIUM 40 MG/0.4ML ~~LOC~~ SOLN
40.0000 mg | SUBCUTANEOUS | Status: DC
  Administered 2011-08-20 – 2011-08-22 (×3): 40 mg via SUBCUTANEOUS
  Filled 2011-08-20 (×4): qty 0.4

## 2011-08-20 NOTE — Progress Notes (Signed)
UR Completed Takara Sermons Graves-Bigelow, RN,BSN [phone]  

## 2011-08-20 NOTE — Progress Notes (Signed)
Internal Medicine Attending  Date: 08/20/2011  Patient name: [name] Medical record number: [id number] Date of birth: 1945/08/13 Age: 66 y.o. Gender: male  I saw and evaluated the patient. I reviewed the resident's note by Dr. Candy Sledge and I agree with the resident's findings and plans as documented in his progress note.  When seen on rounds this AM [name] was groggy and would fall asleep while we were talking.  He was easily arousable and answered questions appropriately but then would fall back asleep.  He had 2 loose bowel movements over the last 12-14 hours.  He was without new complaints.  We are continuing antifungal therapy.  The cotton wool spots are likely secondary to his chronic hypertension but will require follow-up in 6 months given the candidemia. The skeletal series was remarkable for only one hypolucency.   I suspect this monoclonal spike is not pathologic at this time and will require follow-up after recovery from the acute illness but we will ask Hematology's opinion.  Otherwise, we are continuing supportive care.

## 2011-08-20 NOTE — Progress Notes (Signed)
Medical Student Daily Progress Note  Subjective: Patient has felt tired and sedated all night and into this morning. He does still report having some pain but it is controlled however his pain medicine has caused him to be rather drowsy and sleepy. He often falls asleep while talking and during physical exam. By late morning he was more arousable and beginning to wake up a bit more. He also endorses having some loose uncontrollable stools for the past day or two. When further asked it was reported to be about 1-2 loose to clear diarrheal stools over 24 hours with no distinct odor associated with the BM. He attributes it to the miralax he had been receiving lately as well. He denies any fevers, chills, rigors, N/V, dyspnea, CP, SOB, HA, or swelling.   Objective: Vital signs in last 24 hours: Filed Vitals:   08/20/11 0122 08/20/11 0500 08/20/11 1008 08/20/11 1118  BP:  160/63 158/81   Pulse:  109 107   Temp: 99.6 F (37.6 C) 99 F (37.2 C)  98.8 F (37.1 C)  TempSrc: Oral Oral  Oral  Resp:  18    Height:      Weight:      SpO2:  92%     Weight change:   Intake/Output Summary (Last 24 hours) at 08/20/11 1406 Last data filed at 08/20/11 0800  Gross per 24 hour  Intake    240 ml  Output    201 ml  Net     39 ml   Physical Exam: BP 144/69  Pulse 104  Temp(Src) 98.5 F (36.9 C) (Oral)  Resp 20  Ht 5\' 10"  (1.778 m)  Wt 93 kg (205 lb 0.4 oz)  BMI 29.42 kg/m2  SpO2 98% General appearance: alert, cooperative and no distress Head: Normocephalic, without obvious abnormality, atraumatic Neck: no adenopathy, no carotid bruit, no JVD, supple, symmetrical, trachea midline and thyroid not enlarged, symmetric, no tenderness/mass/nodules Back: symmetric, no curvature. ROM normal. No CVA tenderness. Lungs: coarse breath sounds B/L Chest wall: no tenderness Heart: regular rate and rhythm, S1, S2 normal, no murmur, click, rub or gallop Abdomen: soft, non-tender; bowel sounds normal; no  masses,  no organomegaly Extremities: extremities normal, atraumatic, no cyanosis or edema Skin: Skin color, texture, turgor normal. 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 Lab Results: Basic Metabolic Panel:  Lab 08/20/11 1610 08/20/11 0605 08/18/11 0515 08/16/11 0445 08/15/11 1730  NA -- 134* 132* -- --  K -- 4.0 4.1 -- --  CL -- 98 99 -- --  CO2 -- 28 23 -- --  GLUCOSE -- 103* 110* -- --  BUN -- 11 12 -- --  CREATININE -- 0.73 0.75 -- --  CALCIUM -- 8.8 8.5 -- --  MG 1.9 -- -- 1.6 --  PHOS -- -- -- 3.1 5.5*   Liver Function Tests:  Lab 08/20/11 0605 08/16/11 0445  AST 27 26  ALT 17 12  ALKPHOS 196* 40  BILITOT 0.4 0.5  PROT 6.7 6.0  ALBUMIN 2.0* 2.1*   No results found for this basename: LIPASE:2,AMYLASE:2 in the last 168 hours No results found for this basename: AMMONIA:2 in the last 168 hours CBC:  Lab 08/20/11 0605 08/18/11 0515  WBC 9.8 10.6*  NEUTROABS 7.8* --  HGB 8.4* 9.1*  HCT 26.5* 28.3*  MCV 84.1 82.5  PLT 201 125*   Cardiac Enzymes: No results found for this basename: CKTOTAL:3,CKMB:3,CKMBINDEX:3,TROPONINI:3 in the last 168 hours BNP:  Lab 08/17/11 0415  PROBNP 4005.0*   D-Dimer: No results found for this basename: DDIMER:2 in the last 168 hours CBG:  Lab 08/15/11 1819  GLUCAP 113*   Hemoglobin A1C: No results found for this basename: HGBA1C in the last 168 hours Fasting Lipid Panel: No results found for this basename: CHOL,HDL,LDLCALC,TRIG,CHOLHDL,LDLDIRECT in the last 478 hours Thyroid Function Tests: No results found for this basename: TSH,T4TOTAL,FREET4,T3FREE,THYROIDAB in the last 168 hours Coagulation: No results found for this basename: LABPROT:4,INR:4 in the last 168 hours Anemia Panel: No results found for this basename: VITAMINB12,FOLATE,FERRITIN,TIBC,IRON,RETICCTPCT in the last 168 hours Urine Drug Screen: Drugs of Abuse  No results found for this basename: labopia, cocainscrnur, labbenz, amphetmu, thcu, labbarb      Alcohol Level: No results found for this basename: ETH:2 in the last 168 hours Urinalysis: No results found for this basename: COLORURINE:2,APPERANCEUR:2,LABSPEC:2,PHURINE:2,GLUCOSEU:2,HGBUR:2,BILIRUBINUR:2,KETONESUR:2,PROTEINUR:2,UROBILINOGEN:2,NITRITE:2,LEUKOCYTESUR:2 in the last 168 hours   Micro Results: Recent Results (from the past 240 hour(s))  FUNGUS CULTURE, BLOOD     Status: Normal (Preliminary result)   Collection Time   08/12/11  3:18 PM      Component Value Range Status Comment   Specimen Description BLOOD LEFT ARM   Final    Special Requests BOTTLES DRAWN AEROBIC AND ANAEROBIC 10CC   Final    Culture NO FUNGUS ISOLATED;CULTURE IN PROGRESS FOR 7 DAYS   Final    Report Status PENDING   Incomplete   CULTURE, BLOOD (ROUTINE X 2)     Status: Normal   Collection Time   08/12/11  6:00 PM      Component Value Range Status Comment   Specimen Description BLOOD ARM RIGHT   Final    Special Requests BOTTLES DRAWN AEROBIC ONLY [facility]   Final    Culture  Setup Time [id number]   Final    Culture NO GROWTH 5 DAYS   Final    Report Status 08/19/2011 FINAL   Final   FUNGUS CULTURE, BLOOD     Status: Normal (Preliminary result)   Collection Time   08/12/11  6:00 PM      Component Value Range Status Comment   Specimen Description BLOOD RIGHT ARM   Final    Special Requests BOTTLES DRAWN AEROBIC ONLY 6CC   Final    Culture NO FUNGUS ISOLATED;CULTURE IN PROGRESS FOR 7 DAYS   Final    Report Status PENDING   Incomplete   CULTURE, BLOOD (ROUTINE X 2)     Status: Normal   Collection Time   08/12/11  6:15 PM      Component Value Range Status Comment   Specimen Description BLOOD ARM LEFT   Final    Special Requests BOTTLES DRAWN AEROBIC AND ANAEROBIC 10CC   Final    Culture  Setup Time [id number]   Final    Culture NO GROWTH 5 DAYS   Final    Report Status 08/19/2011 FINAL   Final   CULTURE, BLOOD (ROUTINE X 2)     Status: Normal   Collection Time   08/13/11 12:40 AM      Component  Value Range Status Comment   Specimen Description BLOOD RIGHT ARM   Final    Special Requests BOTTLES DRAWN AEROBIC AND ANAEROBIC 5CC   Final    Culture  Setup Time [id number]   Final    Culture     Final    Value: STAPHYLOCOCCUS SPECIES (COAGULASE NEGATIVE)     Note: THE SIGNIFICANCE OF ISOLATING THIS ORGANISM FROM A SINGLE SET OF BLOOD CULTURES  WHEN MULTIPLE SETS ARE DRAWN IS UNCERTAIN. PLEASE NOTIFY THE MICROBIOLOGY DEPARTMENT WITHIN ONE WEEK IF SPECIATION AND SENSITIVITIES ARE REQUIRED.     Note: Gram Stain Report Called to,Read Back By and Verified With: NADINE WELLINGTON 08/14/2011 7:51AM YIMSU   Report Status 08/15/2011 FINAL   Final   CULTURE, BLOOD (ROUTINE X 2)     Status: Normal   Collection Time   08/13/11 12:43 AM      Component Value Range Status Comment   Specimen Description BLOOD RIGHT HAND   Final    Special Requests BOTTLES DRAWN AEROBIC AND ANAEROBIC [facility]   Final    Culture  Setup Time [id number]   Final    Culture     Final    Value: PROPIONIBACTERIUM SPECIES     Note: Gram Stain Report Called to,Read Back By and Verified With: Sallee Lange RN on 08/17/11 at 21:42 by Christie Nottingham PREVIOUSLY REPORTED AS NO GROWTH 5 DAYS CORRECTED RESULTS CALLED TO: SAMANTHA POWELL ON 3700 AT [id number] [address]   Report Status 08/20/2011 FINAL   Final   AFB CULTURE, BLOOD     Status: Normal (Preliminary result)   Collection Time   08/13/11 12:45 AM      Component Value Range Status Comment   Specimen Description BLOOD RIGHT HAND   Final    Special Requests 5CC   Final    Culture     Final    Value: CULTURE WILL BE EXAMINED FOR 6 WEEKS BEFORE ISSUING A FINAL REPORT   Report Status PENDING   Incomplete   WOUND CULTURE     Status: Normal   Collection Time   08/15/11  4:08 PM      Component Value Range Status Comment   Specimen Description WOUND   Final    Special Requests INFECTED PACING LEAD   Final    Gram Stain     Final    Value: NO WBC SEEN     NO SQUAMOUS EPITHELIAL CELLS SEEN      NO ORGANISMS SEEN   Culture FEW CANDIDA ALBICANS   Final    Report Status 08/18/2011 FINAL   Final   FUNGUS CULTURE W SMEAR     Status: Normal (Preliminary result)   Collection Time   08/15/11  4:08 PM      Component Value Range Status Comment   Specimen Description WOUND   Final    Special Requests INFECTED PACING LEAD   Final    Fungal Smear FEW HYPHAE   Final    Culture YEAST CONSISTENT WITH CANDIDA SPECIES   Final    Report Status PENDING   Incomplete   WOUND CULTURE     Status: Normal   Collection Time   08/15/11  4:10 PM      Component Value Range Status Comment   Specimen Description WOUND   Final    Special Requests ICD DEVICE POCKET SWAB REC'D   Final    Gram Stain     Final    Value: NO WBC SEEN     NO SQUAMOUS EPITHELIAL CELLS SEEN     NO ORGANISMS SEEN   Culture NO GROWTH 2 DAYS   Final    Report Status 08/18/2011 FINAL   Final   FUNGUS CULTURE W SMEAR     Status: Normal (Preliminary result)   Collection Time   08/15/11  4:10 PM      Component Value Range Status Comment   Specimen Description WOUND  Final    Special Requests ICD DEVICE POCKET SWAB REC'D   Final    Fungal Smear NO YEAST OR FUNGAL ELEMENTS SEEN   Final    Culture CULTURE IN PROGRESS FOR FOUR WEEKS   Final    Report Status PENDING   Incomplete   CULTURE, BLOOD (ROUTINE X 2)     Status: Normal (Preliminary result)   Collection Time   08/16/11  4:05 AM      Component Value Range Status Comment   Specimen Description BLOOD RIGHT WRIST   Final    Special Requests BOTTLES DRAWN AEROBIC ONLY 1CC   Final    Culture  Setup Time [id number]   Final    Culture     Final    Value:        BLOOD CULTURE RECEIVED NO GROWTH TO DATE CULTURE WILL BE HELD FOR 5 DAYS BEFORE ISSUING A FINAL NEGATIVE REPORT   Report Status PENDING   Incomplete   CULTURE, BLOOD (ROUTINE X 2)     Status: Normal (Preliminary result)   Collection Time   08/16/11  4:15 AM      Component Value Range Status Comment   Specimen  Description BLOOD RIGHT HAND   Final    Special Requests BOTTLES DRAWN AEROBIC ONLY 2CC   Final    Culture  Setup Time [id number]   Final    Culture     Final    Value:        BLOOD CULTURE RECEIVED NO GROWTH TO DATE CULTURE WILL BE HELD FOR 5 DAYS BEFORE ISSUING A FINAL NEGATIVE REPORT   Report Status PENDING   Incomplete   CLOSTRIDIUM DIFFICILE BY PCR     Status: Normal   Collection Time   08/20/11 12:48 PM      Component Value Range Status Comment   C difficile by pcr NEGATIVE  NEGATIVE  Final    Studies/Results: Ct Angio Chest W/cm &/or Wo Cm  08/18/2011  *RADIOLOGY REPORT*  Clinical Data: Fevers, cough, and endocarditis.  CT ANGIOGRAPHY CHEST  Technique:  Multidetector CT imaging of the chest using the standard protocol during bolus administration of intravenous contrast. Multiplanar reconstructed images including MIPs were obtained and reviewed to evaluate the vascular anatomy.  Contrast: 80mL OMNIPAQUE IOHEXOL 350 MG/ML SOLN  Comparison: Chest x-ray dated 08/18/2011 and chest CT dated 07/18/2011  Findings: The patient has a massive right pulmonary embolus completely obstructing the pulmonary arteries to the right lower lobe.  There is still some flow to the right middle lobe and in and to the right upper lobe.  There are multiple smaller pulmonary emboli in the left lung primarily in the left upper lobe.  There is a moderate right pleural effusion.  There is an interstitial infiltrate in the right lower lobe which may be secondary to pulmonary infarction.  There is a small patchy infiltrate in the superior segment of the left lower lobe which could be infarction due to an embolus.  There is a new irregular air containing fluid pocket possible hematoma at the site of the previous pacemaker generator.  This may represent a postoperative hematoma with postoperative air and given the patient's clinical history this could represent an abscess.  Heart is at the upper limits of normal in size.   Evidence of prior CABG.  There are a few small reactive mediastinal lymph nodes, more prominent than on the prior study.  Visualized portion of the upper abdomen demonstrates a few small gallstones.  IMPRESSION:  1.  Massive right pulmonary embolus with probable infarction in the right lower lobe and possibly within the lateral segment of the right middle lobe. 2.  Moderate right pleural effusion. 3.  Multiple smaller emboli in the left lung with a either a patchy pneumonia or possible pulmonary infarct in the superior segment of the left lower lobe. 4.  Cholelithiasis.  Critical Value/emergent results were called by telephone at the time of interpretation on 08/18/2011  at 7:00 p.m.  to  the patient's nurse, Efraim Kaufmann, who verbally acknowledged these results.  Original Report Authenticated By: Gwynn Burly, M.D.   Dg Bone Survey Met  08/19/2011  *RADIOLOGY REPORT*  Clinical Data: M spike on SPEP and immune suppression, evaluate for lytic lesions of multiple myeloma  METASTATIC BONE SURVEY  Comparison: Chest CT - 08/18/2011; CT abdomen pelvis - 07/18/2011; right hip CT - 08/10/2011  Findings:  There is a possible 1.2 x 2.2 cm lucency within the vertex of the calvarium.  Otherwise, no discrete lytic lesions are identified.  Degenerative change within the lumbar spine, worse at the L1 - L2 and L4 - L5. Multilevel cervical spine degenerative change.  Post mediastinotomy and CABG.  Surgical clips overlying the medial aspect of the right upper thigh. Vascular calcifications.  IMPRESSION:  Possible 2.2 cm lucency within the vertex of the calvarium. Further evaluation with head CT of brain MRI may be performed as clinically indicated. Otherwise, no additional lucent lesions identified.  Original Report Authenticated By: Waynard Reeds, M.D.   Medications:  I have reviewed the patient's current medications. Prior to Admission:  Prescriptions prior to admission  Medication Sig Dispense Refill  . Ascorbic Acid  (VITAMIN C) 100 MG tablet Take 100 mg by mouth daily.      . calcium carbonate (TUMS - DOSED IN MG ELEMENTAL CALCIUM) 500 MG chewable tablet Chew 1 tablet by mouth 3 (three) times daily.      . diphenhydramine-acetaminophen (TYLENOL PM) 25-500 MG TABS Take 1 tablet by mouth at bedtime as needed. For pain      . esomeprazole (NEXIUM) 40 MG capsule Take 40 mg by mouth 2 (two) times daily.       . famotidine (PEPCID) 10 MG tablet Take 10 mg by mouth 2 (two) times daily.      . Ibuprofen 200 MG CAPS Take 600-800 mg by mouth every 6 (six) hours as needed. Depending on fever      . metoprolol (LOPRESSOR) 50 MG tablet Take 25 mg by mouth daily. Takes in am      . Multiple Vitamins-Minerals (MULTIVITAMIN) tablet Take 1 tablet by mouth daily with breakfast.   30 tablet    . Phenylephrine-Acetaminophen 5-325 MG TABS Take 1 tablet by mouth every 4 (four) hours as needed. For sinus headache      . traMADol (ULTRAM) 50 MG tablet Take 1 tablet (50 mg total) by mouth every 6 (six) hours as needed for pain.  20 tablet  0  . DISCONTD: acetaminophen (TYLENOL) 500 MG tablet Take 500 mg by mouth every 6 (six) hours as needed. For pain.      [name] DISCONTD: Aspirin-Salicylamide-Caffeine (BC HEADACHE POWDER PO) Take 1 Package by mouth daily as needed. For headaches.      [name] DISCONTD: Flaxseed, Linseed, 1000 MG CAPS Take 1 capsule by mouth daily.       Anti-infectives     Start     Dose/Rate Route Frequency Ordered Stop   08/15/11 2200  piperacillin-tazobactam (ZOSYN) IVPB 3.375 g       3.375 g 12.5 mL/hr over 240 Minutes Intravenous 3 times per day 08/15/11 1758     08/15/11 1830   vancomycin (VANCOCIN) 1,250 mg in sodium chloride 0.9 % 250 mL IVPB  Status:  Discontinued        1,250 mg 166.7 mL/hr over 90 Minutes Intravenous Every 12 hours 08/15/11 1758 08/18/11 1452   08/15/11 1535   gentamycin 80 mg in 0.9% normal saline 250 mL irrigation  Status:  Discontinued          As needed 08/15/11 1535 08/15/11 1608    08/15/11 1515   gentamycin 80 mg in 0.9% normal saline 250 mL irrigation  Status:  Discontinued        1 application Irrigation  Once 08/15/11 1507 08/15/11 1509   08/15/11 1515   gentamicin (GARAMYCIN) 80 mg in sodium chloride irrigation 0.9 % 500 mL irrigation         Irrigation Once 08/15/11 1510     08/14/11 1500   gentamicin (GARAMYCIN) 80 mg in sodium chloride irrigation 0.9 % 500 mL irrigation  Status:  Discontinued        80 mg Irrigation On call 08/14/11 1412 08/15/11 1819   08/14/11 1500   ceFAZolin (ANCEF) IVPB 2 g/50 mL premix        2 g 100 mL/hr over 30 Minutes Intravenous On call 08/14/11 1412 08/15/11 1430   08/13/11 1200   micafungin (MYCAMINE) 150 mg in sodium chloride 0.9 % 100 mL IVPB     Comments: Wait until fungal blood cultures are drawn before starting      150 mg 100 mL/hr over 1 Hours Intravenous Daily 08/13/11 0948     08/13/11 1000   fluconazole (DIFLUCAN) tablet 100 mg  Status:  Discontinued        100 mg Oral Daily 08/12/11 1125 08/13/11 0948   08/12/11 1200   fluconazole (DIFLUCAN) tablet 200 mg        200 mg Oral Daily 08/12/11 1125 08/12/11 1426         Scheduled Meds:   . antiseptic oral rinse  15 mL Mouth Rinse QID  . aspirin EC  81 mg Oral Daily  . chlorhexidine  15 mL Mouth/Throat BID  . feeding supplement  237 mL Oral BID BM  . gentamicin irrigation   Irrigation Once  . LORazepam  1 mg Intravenous Once  . metoprolol  25 mg Oral BID  . micafungin [facility]) IV  150 mg Intravenous Daily  . mulitivitamin with minerals  1 tablet Oral Daily  . pantoprazole  40 mg Oral Q1200  . piperacillin-tazobactam (ZOSYN)  IV  3.375 g Intravenous Q8H  . polyethylene glycol  17 g Oral Daily  . DISCONTD: oxyCODONE  10 mg Oral Q12H   Continuous Infusions:   . DISCONTD: sodium chloride 20 mL/hr at 08/16/11 2011   PRN Meds:.acetaminophen, metoprolol, ondansetron (ZOFRAN) IV, oxyCODONE-acetaminophen, sodium chloride, DISCONTD:  oxyCODONE-acetaminophen Assessment/Plan:  Fever of Known origin - Tmax 99.6. Patient has been febrile for about 48 hours now. Patient also reported no subjective fever overnight as well. He is s/p ICU 5/16-5/18 2/2 hypotension/tachycardia/desats/respi failure requiring ET intubation and pressors on 5/16-5/17. FUO most likely caused by his Candida which is being aggressively treated.  Negative labs to date: uric acid, HIV, RPR, Cryptococcal Ag, CT hip, cyclic citrul Ab IgG, Quantiferon gold, Chlamydia, Brucellosis, HLA B27  Pending tests: Q fever  Cultures positive for:  C. albicans (5/16), CoagNeg Staph species (5/14), Propionibacterium species(5/14) -Appreciate ID, GI, cardiology, opthomology assistance in the care and management of this patient  -continue micafungin (5/14>>) x 6 weeks, then will transition to fluconazole -PICC was successfully inserted 08/19/11 -vanc d/c'd (5/16-5/19), will d/c zosyn (5/16-5/21)  Pulmonary infiltrate - CT angio chest (5/19) reveals massive right PE in RLL, moderate R pleural effusion, possible right interstitial infiltrate 2/2 infarction, and small patchy infiltrate in superior segment of LLL (infarction 2/2 embolus). Findings are related to embolization of ICD mass during removal.  -patient in no distress, no dyspnea -repeat CXR today  Monoclonal gammopathy - Admission CMP revealed protein gap of 5 (total protein - albumin). UPEP revealed increased kappa chains (28.2) in urine (kappa/lambda ratio = 20.58). 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 His calcium remains WNL (8.5) and SCr =0.75 and no s/s of aching bone pains. IFE revealed increased IgA (537) and increased IgM (723). These could be increased acutely for his esophageal disease (IgA in mucosa) and the infection in his heart (IgM acute response to C. albicans and possible coag neg staph in heart vegetation that has now embolized to R PA).  -Appreciate hematology consult in  the management of this patient -Bone survey reveals possible 2cm lucency in the vertex of calvarium however this could also be a variation of normal in older patients; rest of survey was negative for lytic lesions or signs for MM  GERD/Esophageal stricture - He is s/p esophageal dilation on 08/12/11. He has had a history of esophageal dilation s/p strictures and candidiasis and poor po intake.  -Regular diet, well tolerated   Ischemic cardiomyopathy - Stable. 2D echo 08/15/11 reveals LVEF = 25% and diffuse hypokinesis. Mild-moderate MVR, mildly dilated LA, mild TVR, and paradoxical septal motion.  -Appreciate cardiology recommendations.  -continue metoprolol BID  -continue ASA  Normocytic anemia - Hgb =9.1, MCV =82.5. This is acute and has developed since March when hemoglobin was 14. Anemia panel revealing: Iron = 21, Sat ratio = 9, TIBC =243, Ferritin = 597, Retic count =1.9, FOBT negative. Smear revealed mild anemia with normal morphology. Still consistent with AOCD given that TIBC is normal (elevated in IDA) and ferritin high although could still be a component of acute phase reactant. Pt had last colonoscopy in 2012 showing sigmoid and descending diverticula (but no recurrent polyps - last noted to have hyperplastic polyps in 2001).   Thrombocytopenia - Likely related to underlying gammopathy. Smear also reveals thrombocytopenia. His PLTC has begun trending upwards as well over the last couple of days. PLTC = 201 today. 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 Multiple causes for this include extension of Candida infection vs. Vitamin B2 deficiency (less likely) vs. Iron deficiency anemia (AOCD more likely at this point), all of which are treatable conditions in this patient. As final diagnosis nears  closer, treatment options for this will narrow as well.  -likely will resolve as his esophageal candida resolves completely   Constipation - He has had spells of decreased po intake during his spells of dysphagia. He has been on a regular diet the last several days with no problems of constipation anymore. In fact he has had a couple episodes of loose, watery diarrhea the  past day or two. -resolved  -continue miralax as needed -C. Diff NEGATIVE  CAD h/o CABG x 4 (2003) - Stable. CE negative x 3 on admission.  -continue metoprolol  -continue ASA  -no statin 2/2 allergy   DVT ppx - SCD's  Disposition - PT will see him today and recommend his needs at discharge. His PICC is ready to go and he will be ready for discharge sometime this week to either home or SNF.   LOS: 12 days   This is a Psychologist, occupational Note.  The care of the patient was discussed with Dr. Quentin Ore and the assessment and plan formulated with their assistance.  Please see their attached note for official documentation of the daily encounter.  [name] 08/20/2011, 2:06 PM     Resident Co-sign Daily Note: I have seen the patient and reviewed the daily progress note by [name] and discussed the care of the patient with them.  See below for documentation of my findings, assessment, and plans.  Subjective: Feeling tired and groggy. Does not like oxycontin. Having loose stools.   Objective: Vital signs in last 24 hours: Filed Vitals:   08/20/11 0500 08/20/11 1008 08/20/11 1118 08/20/11 1300  BP: 160/63 158/81  144/69  Pulse: 109 107  104  Temp: 99 F (37.2 C)  98.8 F (37.1 C) 98.5 F (36.9 C)  TempSrc: Oral  Oral   Resp: 18   20  Height:      Weight:      SpO2: 92%   98%   Physical Exam: .General: resting in bed, drowsy appearing. Falling asleep while we talk Cardiac: RRR, no rubs, murmurs or gallops  Pulm: moving normal volumes of air, right-sided inspiratory crackles, no egophony. Left  sided coarse breath sounds new from yesterday. Abd: soft, nontender, nondistended, BS present  Ext: warm and well perfused, no pedal edema. Left arm is swollen, red and tender.  Neuro: oriented X3, cranial nerves II-XII grossly intact  Lab Results: Reviewed and documented in Electronic Record Micro Results: Reviewed and documented in Electronic Record Studies/Results: Reviewed and documented in Electronic Record Medications: I have reviewed the patient's current medications. Scheduled Meds:   . antiseptic oral rinse  15 mL Mouth Rinse QID  . aspirin EC  81 mg Oral Daily  . chlorhexidine  15 mL Mouth/Throat BID  . feeding supplement  237 mL Oral BID BM  . gentamicin irrigation   Irrigation Once  . LORazepam  1 mg Intravenous Once  . metoprolol  25 mg Oral BID  . micafungin [facility]) IV  150 mg Intravenous Daily  . mulitivitamin with minerals  1 tablet Oral Daily  . pantoprazole  40 mg Oral Q1200  . piperacillin-tazobactam (ZOSYN)  IV  3.375 g Intravenous Q8H  . polyethylene glycol  17 g Oral Daily  . 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 He was placed on micafungin on (5/14). 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 He was transferred to the ICU from the 16th through 18th requiring intubation for one day for respiratory depression. Now afebrile for approximately 2 days. Ophtho consult.states cotton wool spots  more consistent with hypertension then septic emboli. Suggested followup as an outpatient. Will  continue to treat with micafungin while we try and speciate his candida.  Per ID this will take a while.   In search of cause for immune suppression,will check CD4. Will confirm with Dr. Ninetta Lights how to order  IgG/M/E/A, CH50 and neutrophil burst assay, so that we know they are done correctly.  -- Continue micafungin for 6 weeks then will transfer to by mouth fluconazole if candida is susceptible  -- Will d/c pip/tazo in AM if afebrile.  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 No anticoagulation required for this reason. Will place on prophylactic doses of Lovenox.  3) GERD/Esophageal stricture - He is s/p esophageal dilation on 08/12/11. Esophageal candidiasis may have been secondary to repeated dilations. The patient also always has a sugar lozenge in his mouth which could predispose to fungal infection. No difficulty with eating after EGD and dilation. -- Continue pantoprazole  -- Regular diet, well tolerated   4) Ischemic cardiomyopathy And CAD h/o CABG x 4 (2003) - Stable. CE negative x 3 on admission. 2D echo 08/15/11 reveals LVEF = 25% and diffuse hypokinesis. Mild-moderate MVR, mildly dilated LA, mild TVR, and paradoxical septal motion. Now has increased crackles and weight gain of greater than 10 pounds since admission. Likely that was fluid overloaded when he was hypotensive in the ICU. Will provide gentle diuresis. -- Lasix 40 by mouth daily -- Appreciate cardiology recommendations.  -- continue metoprolol BID  -- continue ASA  -- no statin 2/2 allergy   5) Weight loss, unintentional - improving with fluids and food. Weight loss was likely secondary to decreased by mouth intake and underlying infection. Now eating well.  -- Continue strawberry flavored Ensure  -- Appreciate nutrition assistance in  management of this patient   6) Normocytic anemia - Hgb =9.1, MCV =82.5. This is acute and has developed since March when hemoglobin was 14. Anemia panel revealing: Iron = 21, Sat ratio = 9, TIBC =243, Ferritin = 597, Retic count =1.9, FOBT negative. Smear revealed mild anemia with normal morphology. Still consistent with AOCD given that TIBC is normal (elevated in IDA) and ferritin high although could still be a component of acute phase reactant. Pt had last colonoscopy in 2012 showing sigmoid and descending diverticula (but no recurrent polyps - last noted to have hyperplastic polyps in 2001).   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 UPEP = increased kappa chains (28.2) in urine with kappa/lambda ratio of 20.58). 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 Serum IFE revealed increased IgA (537) and increased IgM (723).Overall besides immunosuppression anemia and thrombocytopenia no overt clinical signs of multiple myeloma. He has no renal failure, hypercalcemia, or lytic bone lesions seen on skeletal survey. 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 Will have patient repeat studies as outpatient when fungal infection is resolved.  -- Will arrange outpatient followup  8) Thrombocytopenia - Likely related to underlying gammopathy. Smear also reveals thrombocytopenia. Platelets ranging from 100s to 120s. Will put on prophylactic dose Lovenox for DVT prophylaxis. Plts improved today to 200s.   9) Diarrhea - C diff negative. Likely 2/2 antimicrobials wiping out normal flora. Will hold stool softerners  10) Debility -  awaiting PT/OT consult   11) disposition - Will obtain PT OT consult.and plan for placement to SNF for weakness.  Ultimately patient will need to be stable from a cardiopulmonary stand point prior to discharge.     DVT ppx - lovenox today      LOS: 12 days   Malaia Buchta 08/20/2011, 6:56 PM

## 2011-08-20 NOTE — Progress Notes (Signed)
Events noted.   Skeletal survey results noted as well.   All of these finding do not suggest multiple myeloma or immune dysregulations.   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    I would recommend repeating SPEP and QIG with IF in in steady state as outpatient. If his SPEP still abnormal, then please refer for hematology follow up as out patient.   Please call with questions.

## 2011-08-20 NOTE — Progress Notes (Signed)
Pt had run of SVT around 1000; pt asymptomatic, BP 158/81, HR currently ST 100s; Tony,PA with [employer] paged and made aware, order to check magnesium received; will continue to monitor

## 2011-08-20 NOTE — Progress Notes (Signed)
PROGRESS NOTE  Subjective:   Pt feels better.  Still a little sleepy.   CT angio of the chest has found a "pulmonary embolus".  I am fairly sure that this is the vegetation and not a venous thromboembolus.    Objective:    Vital Signs:   Temp:  [98.5 F (36.9 C)-100.3 F (37.9 C)] 99 F (37.2 C) (05/21 0500) Pulse Rate:  [87-109] 109  (05/21 0500) Resp:  [17-20] 18  (05/21 0500) BP: (116-167)/(63-79) 160/63 mmHg (05/21 0500) SpO2:  [91 %-97 %] 92 % (05/21 0500)  Last BM Date: 08/19/11   24-hour weight change: Weight change:   Weight trends: Filed Weights   08/16/11 0500 08/17/11 0500 08/19/11 0500  Weight: 199 lb 15.3 oz (90.7 kg) 201 lb 11.5 oz (91.5 kg) 205 lb 0.4 oz (93 kg)    Intake/Output:  05/20 0701 - 05/21 0700 In: 360 [P.O.:360] Out: 201 [Urine:200; Stool:1]     Physical Exam: BP 160/63  Pulse 109  Temp(Src) 99 F (37.2 C) (Oral)  Resp 18  Ht 5\' 10"  (1.778 m)  Wt 205 lb 0.4 oz (93 kg)  BMI 29.42 kg/m2  SpO2 92%  General: Vital signs reviewed and noted. Well-developed, well-nourished, in no acute distress; alert, appropriate and cooperative throughout examination.  Head: Normocephalic, atraumatic.  Eyes: conjunctivae/corneas clear. PERRL, EOM's intact. Fundi benign.  Throat: normal  Neck: Supple. Normal carotids. No JVD  Lungs:  Clear anteriorly  Heart: Regular rate,  With normal  S1 S2. No murmurs, gallops or rubs  Abdomen:  Soft, non-tender, non-distended with normoactive bowel sounds. No hepatomegaly. No rebound/guarding. No abdominal masses.  Extremities: Distal pedal pulses are 2+ .  No edema.    Neurologic: A&O X3, CN II - XII are grossly intact. Motor strength is 5/5 in the all 4 extremities. sleepy  Psych: Responds to questions appropriately with normal affect.    Labs: BMET:  Basename 08/20/11 0605 08/18/11 0515  NA 134* 132*  K 4.0 4.1  CL 98 99  CO2 28 23  GLUCOSE 103* 110*  BUN 11 12  CREATININE 0.73 0.75  CALCIUM 8.8 8.5    MG -- --  PHOS -- --    Liver function tests:  [facility] 08/20/11 0605  AST 27  ALT 17  ALKPHOS 196*  BILITOT 0.4  PROT 6.7  ALBUMIN 2.0*   No results found for this basename: LIPASE:2,AMYLASE:2 in the last 72 hours  CBC:  Basename 08/20/11 0605 08/18/11 0515  WBC 9.8 10.6*  NEUTROABS 7.8* --  HGB 8.4* 9.1*  HCT 26.5* 28.3*  MCV 84.1 82.5  PLT 201 125*    Cardiac Enzymes: No results found for this basename: CKTOTAL:4,CKMB:4,TROPONINI:4 in the last 72 hours  Coagulation Studies: No results found for this basename: LABPROT:5,INR:5 in the last 72 hours  Other:   Tele:  NSR   Medications:    Infusions:    . DISCONTD: sodium chloride 20 mL/hr at 08/16/11 2011    Scheduled Medications:    . antiseptic oral rinse  15 mL Mouth Rinse QID  . aspirin EC  81 mg Oral Daily  . chlorhexidine  15 mL Mouth/Throat BID  . feeding supplement  237 mL Oral BID BM  . gentamicin irrigation   Irrigation Once  . LORazepam  1 mg Intravenous Once  . metoprolol  25 mg Oral BID  . micafungin [facility]) IV  150 mg Intravenous Daily  . mulitivitamin with minerals  1 tablet Oral Daily  .  pantoprazole  40 mg Oral Q1200  . piperacillin-tazobactam (ZOSYN)  IV  3.375 g Intravenous Q8H  . polyethylene glycol  17 g Oral Daily  . DISCONTD: oxyCODONE  10 mg Oral Q12H    Assessment/ Plan:    1. Chronic systolic CHF:  Continue current meds.  We will arrange for him to get another BI-V ICD in several months ( after his blood is sterilized)  2. ?PE:  This is likely the fungal vegetation but I think it would be prudent to do a venous duplex of his legs to R/O DVT.  He has SCDs on at present.  3. Fungal infection:  Plans per Int. Med.  Length of Stay: 12  Vesta Mixer, Montez Hageman., MD, [facility] 08/20/2011, 8:26 AM Office [phone] Pager [phone]

## 2011-08-20 NOTE — Progress Notes (Signed)
Spoke with Dr.Raisch about ordering pt venous dopplars per Dr.Nahser's note; Dr.Raisch stated that he did not think that it would be necessary since it's believed that the PE is vegetation

## 2011-08-20 NOTE — Progress Notes (Signed)
Physical Therapy  Discussed care with Dr. Candy Sledge - patient does not require dopplers pre-mobilization with PT as emboli from fungal source. We will follow up with patient at later time.  Edwyna Perfect, PT  Pager [phone]

## 2011-08-20 NOTE — Progress Notes (Addendum)
INFECTIOUS DISEASE PROGRESS NOTE  ID: [name] is a 66 y.o. 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 Per family, he is much more alert and oriented.   Abtx:  Anti-infectives     Start     Dose/Rate Route Frequency Ordered Stop   08/15/11 2200  piperacillin-tazobactam (ZOSYN) IVPB 3.375 g       3.375 g 12.5 mL/hr over 240 Minutes Intravenous 3 times per day 08/15/11 1758     08/15/11 1830   vancomycin (VANCOCIN) 1,250 mg in sodium chloride 0.9 % 250 mL IVPB  Status:  Discontinued        1,250 mg 166.7 mL/hr over 90 Minutes Intravenous Every 12 hours 08/15/11 1758 08/18/11 1452   08/15/11 1535   gentamycin 80 mg in 0.9% normal saline 250 mL irrigation  Status:  Discontinued          As needed 08/15/11 1535 08/15/11 1608   08/15/11 1515   gentamycin 80 mg in 0.9% normal saline 250 mL irrigation  Status:  Discontinued        1 application Irrigation  Once 08/15/11 1507 08/15/11 1509   08/15/11 1515   gentamicin (GARAMYCIN) 80 mg in sodium chloride irrigation 0.9 % 500 mL irrigation         Irrigation Once 08/15/11 1510     08/14/11 1500   gentamicin (GARAMYCIN) 80 mg in sodium chloride irrigation 0.9 % 500 mL irrigation  Status:  Discontinued        80 mg Irrigation On call 08/14/11 1412 08/15/11 1819   08/14/11 1500   ceFAZolin (ANCEF) IVPB 2 g/50 mL premix        2 g 100 mL/hr over 30 Minutes Intravenous On call 08/14/11 1412 08/15/11 1430   08/13/11 1200   micafungin (MYCAMINE) 150 mg in sodium chloride 0.9 % 100 mL IVPB     Comments:  Wait until fungal blood cultures are drawn before starting      150 mg 100 mL/hr over 1 Hours Intravenous Daily 08/13/11 0948     08/13/11 1000   fluconazole (DIFLUCAN) tablet 100 mg  Status:  Discontinued        100 mg Oral Daily 08/12/11 1125 08/13/11 0948   08/12/11 1200   fluconazole (DIFLUCAN) tablet 200 mg        200 mg Oral Daily 08/12/11 1125 08/12/11 1426          Medications:  Scheduled:   . antiseptic oral rinse  15 mL Mouth Rinse QID  . aspirin EC  81 mg Oral Daily  . chlorhexidine  15 mL Mouth/Throat BID  . feeding supplement  237 mL Oral BID BM  . gentamicin irrigation   Irrigation Once  . LORazepam  1 mg Intravenous Once  . metoprolol  25 mg Oral BID  . micafungin [facility]) IV  150 mg Intravenous Daily  . mulitivitamin with minerals  1 tablet Oral Daily  . pantoprazole  40 mg Oral Q1200  . piperacillin-tazobactam (ZOSYN)  IV  3.375 g Intravenous Q8H  . polyethylene glycol  17 g Oral Daily  .  DISCONTD: oxyCODONE  10 mg Oral Q12H    Objective: Vital signs in last 24 hours: Temp:  [98.5 F (36.9 C)-100.3 F (37.9 C)] 98.5 F (36.9 C) (05/21 1300) Pulse Rate:  [99-109] 104  (05/21 1300) Resp:  [18-20] 20  (05/21 1300) BP: (139-167)/(63-81) 144/69 mmHg (05/21 1300) SpO2:  [91 %-98 %] 98 % (05/21 1300)   Resp: clear to auscultation bilaterally Cardio: regular rate and rhythm GI: normal findings: bowel sounds normal and soft, non-tender Extremities: RUE PIC  Lab Results  Basename 08/20/11 0605 08/18/11 0515  WBC 9.8 10.6*  HGB 8.4* 9.1*  HCT 26.5* 28.3*  NA 134* 132*  K 4.0 4.1  CL 98 99  CO2 28 23  BUN 11 12  CREATININE 0.73 0.75  GLU -- --   Liver Panel  Basename 08/20/11 0605  PROT 6.7  ALBUMIN 2.0*  AST 27  ALT 17  ALKPHOS 196*  BILITOT 0.4  BILIDIR --  IBILI --   Sedimentation Rate No results found for this basename: ESRSEDRATE in the last 72 hours C-Reactive Protein No results found for this basename: CRP:2 in the last 72  hours  Microbiology: Recent Results (from the past 240 hour(s))  FUNGUS CULTURE, BLOOD     Status: Normal (Preliminary result)   Collection Time   08/12/11  3:18 PM      Component Value Range Status Comment   Specimen Description BLOOD LEFT ARM   Final    Special Requests BOTTLES DRAWN AEROBIC AND ANAEROBIC 10CC   Final    Culture NO FUNGUS ISOLATED;CULTURE IN PROGRESS FOR 7 DAYS   Final    Report Status PENDING   Incomplete   CULTURE, BLOOD (ROUTINE X 2)     Status: Normal   Collection Time   08/12/11  6:00 PM      Component Value Range Status Comment   Specimen Description BLOOD ARM RIGHT   Final    Special Requests BOTTLES DRAWN AEROBIC ONLY [facility]   Final    Culture  Setup Time [id number]   Final    Culture NO GROWTH 5 DAYS   Final    Report Status 08/19/2011 FINAL   Final   FUNGUS CULTURE, BLOOD     Status: Normal (Preliminary result)   Collection Time   08/12/11  6:00 PM      Component Value Range Status Comment   Specimen Description BLOOD RIGHT ARM   Final    Special Requests BOTTLES DRAWN AEROBIC ONLY 6CC   Final    Culture NO FUNGUS ISOLATED;CULTURE IN PROGRESS FOR 7 DAYS   Final    Report Status PENDING   Incomplete   CULTURE, BLOOD (ROUTINE X 2)     Status: Normal   Collection Time   08/12/11  6:15 PM      Component Value Range Status Comment   Specimen Description BLOOD ARM LEFT   Final    Special Requests BOTTLES DRAWN AEROBIC AND ANAEROBIC 10CC   Final    Culture  Setup Time [id number]   Final    Culture NO GROWTH 5 DAYS   Final    Report Status 08/19/2011 FINAL   Final   CULTURE, BLOOD (ROUTINE X 2)     Status: Normal   Collection Time   08/13/11 12:40 AM      Component Value Range Status Comment   Specimen Description BLOOD RIGHT ARM   Final    Special Requests BOTTLES DRAWN AEROBIC AND ANAEROBIC 5CC   Final  Culture  Setup Time [id number]   Final    Culture     Final    Value: STAPHYLOCOCCUS SPECIES (COAGULASE NEGATIVE)     Note: THE SIGNIFICANCE OF  ISOLATING THIS ORGANISM FROM A SINGLE SET OF BLOOD CULTURES WHEN MULTIPLE SETS ARE DRAWN IS UNCERTAIN. PLEASE NOTIFY THE MICROBIOLOGY DEPARTMENT WITHIN ONE WEEK IF SPECIATION AND SENSITIVITIES ARE REQUIRED.     Note: Gram Stain Report Called to,Read Back By and Verified With: NADINE WELLINGTON 08/14/2011 7:51AM YIMSU   Report Status 08/15/2011 FINAL   Final   CULTURE, BLOOD (ROUTINE X 2)     Status: Normal   Collection Time   08/13/11 12:43 AM      Component Value Range Status Comment   Specimen Description BLOOD RIGHT HAND   Final    Special Requests BOTTLES DRAWN AEROBIC AND ANAEROBIC [facility]   Final    Culture  Setup Time [id number]   Final    Culture     Final    Value: PROPIONIBACTERIUM SPECIES     Note: Gram Stain Report Called to,Read Back By and Verified With: Sallee Lange RN on 08/17/11 at 21:42 by Christie Nottingham PREVIOUSLY REPORTED AS NO GROWTH 5 DAYS CORRECTED RESULTS CALLED TO: SAMANTHA POWELL ON 3700 AT [id number] CASTILLO C   Report Status 08/20/2011 FINAL   Final   AFB CULTURE, BLOOD     Status: Normal (Preliminary result)   Collection Time   08/13/11 12:45 AM      Component Value Range Status Comment   Specimen Description BLOOD RIGHT HAND   Final    Special Requests 5CC   Final    Culture     Final    Value: CULTURE WILL BE EXAMINED FOR 6 WEEKS BEFORE ISSUING A FINAL REPORT   Report Status PENDING   Incomplete   WOUND CULTURE     Status: Normal   Collection Time   08/15/11  4:08 PM      Component Value Range Status Comment   Specimen Description WOUND   Final    Special Requests INFECTED PACING LEAD   Final    Gram Stain     Final    Value: NO WBC SEEN     NO SQUAMOUS EPITHELIAL CELLS SEEN     NO ORGANISMS SEEN   Culture FEW CANDIDA ALBICANS   Final    Report Status 08/18/2011 FINAL   Final   FUNGUS CULTURE W SMEAR     Status: Normal (Preliminary result)   Collection Time   08/15/11  4:08 PM      Component Value Range Status Comment   Specimen Description WOUND   Final     Special Requests INFECTED PACING LEAD   Final    Fungal Smear FEW HYPHAE   Final    Culture YEAST CONSISTENT WITH CANDIDA SPECIES   Final    Report Status PENDING   Incomplete   WOUND CULTURE     Status: Normal   Collection Time   08/15/11  4:10 PM      Component Value Range Status Comment   Specimen Description WOUND   Final    Special Requests ICD DEVICE POCKET SWAB REC'D   Final    Gram Stain     Final    Value: NO WBC SEEN     NO SQUAMOUS EPITHELIAL CELLS SEEN     NO ORGANISMS SEEN   Culture NO GROWTH 2 DAYS   Final    Report Status 08/18/2011 FINAL  Final   FUNGUS CULTURE W SMEAR     Status: Normal (Preliminary result)   Collection Time   08/15/11  4:10 PM      Component Value Range Status Comment   Specimen Description WOUND   Final    Special Requests ICD DEVICE POCKET SWAB REC'D   Final    Fungal Smear NO YEAST OR FUNGAL ELEMENTS SEEN   Final    Culture CULTURE IN PROGRESS FOR FOUR WEEKS   Final    Report Status PENDING   Incomplete   CULTURE, BLOOD (ROUTINE X 2)     Status: Normal (Preliminary result)   Collection Time   08/16/11  4:05 AM      Component Value Range Status Comment   Specimen Description BLOOD RIGHT WRIST   Final    Special Requests BOTTLES DRAWN AEROBIC ONLY 1CC   Final    Culture  Setup Time [id number]   Final    Culture     Final    Value:        BLOOD CULTURE RECEIVED NO GROWTH TO DATE CULTURE WILL BE HELD FOR 5 DAYS BEFORE ISSUING A FINAL NEGATIVE REPORT   Report Status PENDING   Incomplete   CULTURE, BLOOD (ROUTINE X 2)     Status: Normal (Preliminary result)   Collection Time   08/16/11  4:15 AM      Component Value Range Status Comment   Specimen Description BLOOD RIGHT HAND   Final    Special Requests BOTTLES DRAWN AEROBIC ONLY 2CC   Final    Culture  Setup Time [id number]   Final    Culture     Final    Value:        BLOOD CULTURE RECEIVED NO GROWTH TO DATE CULTURE WILL BE HELD FOR 5 DAYS BEFORE ISSUING A FINAL NEGATIVE REPORT   Report  Status PENDING   Incomplete   CLOSTRIDIUM DIFFICILE BY PCR     Status: Normal   Collection Time   08/20/11 12:48 PM      Component Value Range Status Comment   C difficile by pcr NEGATIVE  NEGATIVE  Final     Studies/Results: Dg Chest 2 View  08/20/2011  *RADIOLOGY REPORT*  Clinical Data: Fever and cough.  Shortness of breath.  CHEST - 2 VIEW  Comparison: Chest x-ray 08/18/2011.  Findings: Multifocal interstitial and airspace opacities are again seen scattered throughout the lungs bilaterally, most pronounced throughout the right mid and lower lungs, as well as the left lower lobe.  There is blunting of the right costophrenic sulcus, compatible with a small right-sided pleural effusion.  No definite left-sided pleural effusion is noted.  Mild pulmonary venous congestion.  Heart size is mildly enlarged (unchanged). The patient is rotated to the left on today's exam, resulting in distortion of the mediastinal contours and reduced diagnostic sensitivity and specificity for mediastinal pathology.  Atherosclerotic calcifications are noted within the arch of the aorta. New right upper extremity PICC with tip terminating in the right atrium. Status post median sternotomy for CABG with LIMA.  IMPRESSION: 1. New right upper extremity PICC with tip terminating in the right atrium. 2.  Allowing for slight differences in patient positioning and depth of inspiration, the radiographic appearance of chest is otherwise essentially unchanged, as above.  Original Report Authenticated By: Florencia Reasons, M.D.   Ct Angio Chest W/cm &/or Wo Cm  08/18/2011  *RADIOLOGY REPORT*  Clinical Data: Fevers, cough, and endocarditis.  CT ANGIOGRAPHY CHEST  Technique:  Multidetector CT imaging of the chest using the standard protocol during bolus administration of intravenous contrast. Multiplanar reconstructed images including MIPs were obtained and reviewed to evaluate the vascular anatomy.  Contrast: 80mL OMNIPAQUE IOHEXOL 350  MG/ML SOLN  Comparison: Chest x-ray dated 08/18/2011 and chest CT dated 07/18/2011  Findings: The patient has a massive right pulmonary embolus completely obstructing the pulmonary arteries to the right lower lobe.  There is still some flow to the right middle lobe and in and to the right upper lobe.  There are multiple smaller pulmonary emboli in the left lung primarily in the left upper lobe.  There is a moderate right pleural effusion.  There is an interstitial infiltrate in the right lower lobe which may be secondary to pulmonary infarction.  There is a small patchy infiltrate in the superior segment of the left lower lobe which could be infarction due to an embolus.  There is a new irregular air containing fluid pocket possible hematoma at the site of the previous pacemaker generator.  This may represent a postoperative hematoma with postoperative air and given the patient's clinical history this could represent an abscess.  Heart is at the upper limits of normal in size.  Evidence of prior CABG.  There are a few small reactive mediastinal lymph nodes, more prominent than on the prior study.  Visualized portion of the upper abdomen demonstrates a few small gallstones.  IMPRESSION:  1.  Massive right pulmonary embolus with probable infarction in the right lower lobe and possibly within the lateral segment of the right middle lobe. 2.  Moderate right pleural effusion. 3.  Multiple smaller emboli in the left lung with a either a patchy pneumonia or possible pulmonary infarct in the superior segment of the left lower lobe. 4.  Cholelithiasis.  Critical Value/emergent results were called by telephone at the time of interpretation on 08/18/2011  at 7:00 p.m.  to  the patient's nurse, Efraim Kaufmann, who verbally acknowledged these results.  Original Report Authenticated By: Gwynn Burly, M.D.   Dg Bone Survey Met  08/19/2011  *RADIOLOGY REPORT*  Clinical Data: M spike on SPEP and immune suppression, evaluate for  lytic lesions of multiple myeloma  METASTATIC BONE SURVEY  Comparison: Chest CT - 08/18/2011; CT abdomen pelvis - 07/18/2011; right hip CT - 08/10/2011  Findings:  There is a possible 1.2 x 2.2 cm lucency within the vertex of the calvarium.  Otherwise, no discrete lytic lesions are identified.  Degenerative change within the lumbar spine, worse at the L1 - L2 and L4 - L5. Multilevel cervical spine degenerative change.  Post mediastinotomy and CABG.  Surgical clips overlying the medial aspect of the right upper thigh. Vascular calcifications.  IMPRESSION:  Possible 2.2 cm lucency within the vertex of the calvarium. Further evaluation with head CT of brain MRI may be performed as clinically indicated. Otherwise, no additional lucent lesions identified.  Original Report Authenticated By: Waynard Reeds, M.D.     Assessment/Plan:  Infective Endocarditis, ICD,  Candida seen on lead (08-15-11)  Massive R pulmonary embolus  MRSA PCR + Day 9 antifungal therapy (micafungin)  Day 6 zosyn  Agree with Dr Josem Kaufmann and Raisch that this is most likely related to his esophageal candidiasis/repeated esophageal dilatations.  I am not sure why he has developed candidiasis in a seemingly normal immune host. Agee with checking CD4, could also check IgG/M/E/A, CH50 (though not typically fingal infections) and neutrophil burst assay (will take some time to result).  optho eval showed cotton wool spots- not completely clear that these represent emboli?  Sensi/ID of candida pending, will most likely take several weeks as has to be sent to off site lab (in [place]).  If he remains afebrile, consider stopping zosyn in AM.  I would plan for his d/c with the expectation that we will not know the exact species and sensi of candida til he is seen in f/u, ie continue him on micafungin.   Johny Sax Infectious Diseases [phone] 08/20/2011, 5:07 PM   LOS: 12 days

## 2011-08-20 NOTE — Progress Notes (Signed)
PT Cancellation Note  Treatment cancelled today due to medical issues with patient which prohibited therapy. Noted patient positive for PE. Discussed with RN and MD as patient not on any anticoagulant. MD to order dopplers prior to PT initiation.  Edwyna Perfect, PT  Pager [phone]  08/20/2011, 8:39 AM

## 2011-08-21 DIAGNOSIS — I447 Left bundle-branch block, unspecified: Secondary | ICD-10-CM

## 2011-08-21 DIAGNOSIS — R918 Other nonspecific abnormal finding of lung field: Secondary | ICD-10-CM

## 2011-08-21 LAB — CBC
HCT: 26.2 % — ABNORMAL LOW (ref 39.0–52.0)
Hemoglobin: 8.2 g/dL — ABNORMAL LOW (ref 13.0–17.0)
MCH: 25.9 pg — ABNORMAL LOW (ref 26.0–34.0)
MCHC: 31.3 g/dL (ref 30.0–36.0)
MCV: 82.9 fL (ref 78.0–100.0)
RBC: 3.16 MIL/uL — ABNORMAL LOW (ref 4.22–5.81)

## 2011-08-21 LAB — DIFFERENTIAL
Basophils Relative: 0 % (ref 0–1)
Eosinophils Absolute: 0.2 10*3/uL (ref 0.0–0.7)
Lymphs Abs: 1.2 10*3/uL (ref 0.7–4.0)
Monocytes Absolute: 0.4 10*3/uL (ref 0.1–1.0)
Monocytes Relative: 7 % (ref 3–12)

## 2011-08-21 LAB — FUNGUS CULTURE, BLOOD
Culture: NO GROWTH
Culture: NO GROWTH

## 2011-08-21 MED ORDER — LORAZEPAM 0.5 MG PO TABS
0.5000 mg | ORAL_TABLET | Freq: Every evening | ORAL | Status: DC | PRN
  Administered 2011-08-21: 0.5 mg via ORAL
  Filled 2011-08-21: qty 1

## 2011-08-21 MED ORDER — ZOLPIDEM TARTRATE 5 MG PO TABS: 5.0000 mg | ORAL_TABLET | Freq: Every evening | ORAL | Status: DC | PRN

## 2011-08-21 MED ORDER — CLONAZEPAM 0.5 MG PO TABS
0.2500 mg | ORAL_TABLET | Freq: Two times a day (BID) | ORAL | Status: DC | PRN
  Administered 2011-08-21 (×2): 0.25 mg via ORAL
  Filled 2011-08-21: qty 2
  Filled 2011-08-21 (×2): qty 1

## 2011-08-21 NOTE — Progress Notes (Signed)
Resident Co-sign Daily Note: I have seen the patient and reviewed the daily progress note by  MS3 Girguis and discussed the care of the patient with them.  See below for documentation of my findings, assessment, and plans.  Subjective: Increasing dyspnea this a.m., slowly progressing.  Not feeling as well today.  Afebrile overnight.  Objective: Vital signs in last 24 hours: Filed Vitals:   08/20/11 2134 08/21/11 0500 08/21/11 0611 08/21/11 1018  BP: 149/66  150/63   Pulse: 84  79 104  Temp: 97.4 F (36.3 C)  99.1 F (37.3 C)   TempSrc: Oral  Oral   Resp: 20  20   Height:      Weight:  206 lb 12.7 oz (93.8 kg)    SpO2: 98%  98%    Physical Exam: GEN: Mild distress, tachypneic RESP:  Tachypneic, crackles in R base, good air movement CARDIOVASCULAR: tachycardic, S1, S2, no m/r/g ABDOMEN: soft, NT/ND, NABS EXT: warm and dry. No edema in b/l LE  Lab Results: Reviewed and documented in Electronic Record Micro Results: Reviewed and documented in Electronic Record Studies/Results: Reviewed and documented in Electronic Record Medications: I have reviewed the patient's current medications. Scheduled Meds:   . antiseptic oral rinse  15 mL Mouth Rinse QID  . aspirin EC  81 mg Oral Daily  . chlorhexidine  15 mL Mouth/Throat BID  . enoxaparin (LOVENOX) injection  40 mg Subcutaneous Q24H  . feeding supplement  237 mL Oral BID BM  . furosemide  40 mg Oral Daily  . gentamicin irrigation   Irrigation Once  . metoprolol  25 mg Oral BID  . micafungin [facility]) IV  150 mg Intravenous Daily  . mulitivitamin with minerals  1 tablet Oral Daily  . pantoprazole  40 mg Oral Q1200  . polyethylene glycol  17 g Oral Daily  . 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 He was placed on micafungin on (5/14). 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 He was transferred to the ICU from the 16th through 18th requiring intubation for one day for respiratory depression. Now afebrile for approximately 3 days. Ophtho consult.states cotton wool spots more consistent with hypertension then septic emboli. Suggested followup as an outpatient. Will continue to treat with micafungin while we try and speciate his candida. Per ID this will take a while. -- Continue micafungin for 6 weeks then will transfer to by mouth fluconazole if candida is susceptible  -- d/c pip/tazo  -- Will obtain surveillance TTE on Friday morning  -- ordered IgG/M/A, and CH50.  Can consider burst assay in future -- Appreciate ID, GI, cardiology, hematology, ophtho assistance in the care and management of this patient   2) Pulmonary Embolism/SOB - most likely secondary to fungus ball embolism. No anticoagulation required for this reason. Will place on prophylactic doses of Lovenox.  Pt is feeling more dyspneic today with crackles on exam.  Some mild respiratory distress.  Likely a combination of known CHF, new embolic event, and sepsis leading to pleural effusion vs pulmonary edema. -- lasix 40mg  PO this a.m. -- reevaluate in afternoon-->check UOP, resp rate --  consider CXR, abg, and further w/u as indicated -- consider transfer to SDU given candidemia (pts can get very sick)  3) GERD/Esophageal stricture - He is s/p esophageal dilation on 08/12/11. Esophageal candidiasis may have been secondary to repeated dilations. The patient also always has a sugar lozenge in his mouth which could predispose to fungal infection. No difficulty  with eating after EGD and dilation. -- Continue pantoprazole  -- Regular diet, well tolerated   4) Ischemic cardiomyopathy And CAD h/o CABG x 4 (2003) - Stable. CE negative x 3 on admission. 2D echo 08/15/11 reveals LVEF = 25% and diffuse hypokinesis. Mild-moderate MVR, mildly dilated LA, mild TVR, and paradoxical septal motion. Now has increased crackles and weight gain of greater than 10 pounds since admission. Likely that was fluid overloaded when he was hypotensive in the ICU. Will provide gentle diuresis. -- Lasix 40 by mouth daily -- Appreciate cardiology recommendations.  -- continue metoprolol BID  -- continue ASA  -- no statin 2/2 allergy   5) Weight loss, unintentional - improving with fluids and food. Weight loss was likely secondary to decreased by mouth intake and underlying infection. Now eating well.  -- Continue strawberry flavored Ensure  -- Appreciate nutrition assistance in management of this patient   6) Normocytic anemia - Hgb =9.1, MCV =82.5. This is acute and has developed since March when hemoglobin was 14. Anemia panel revealing: Iron = 21, Sat ratio = 9, TIBC =243, Ferritin = 597, Retic count =1.9, FOBT negative. Smear revealed mild anemia with normal morphology. Still consistent with AOCD given that TIBC is normal (elevated in IDA) and ferritin high although could still be a component of acute phase reactant. Pt had last colonoscopy in 2012 showing sigmoid and descending diverticula (but no recurrent polyps - last noted to have hyperplastic polyps in 2001).   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 UPEP = increased kappa chains (28.2) in urine with kappa/lambda ratio of 20.58). 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 Serum IFE revealed increased IgA (537) and increased IgM (723).Overall besides immunosuppression anemia and thrombocytopenia no overt clinical signs of multiple myeloma. He has no renal  failure, hypercalcemia, or lytic bone lesions seen on skeletal survey. 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 Will have patient repeat studies as outpatient when fungal infection is resolved.  -- Will arrange outpatient followup  8) Thrombocytopenia - Likely related to underlying gammopathy. Smear also reveals thrombocytopenia. Platelets ranging from 100s to 120s. Will put on prophylactic dose Lovenox for DVT prophylaxis. Plts improved today to 200s.   9) Diarrhea - C diff negative. Likely 2/2 antimicrobials wiping out normal flora. Will hold stool softerers  10) Debility - PT recommends HHPT, will arrange. Ordered HH equipment  11) disposition - Aim for HHPT and RN for home abx. Ultimately patient will need to be stable from a cardiopulmonary stand point prior to discharge.   DVT ppx - lovenox today   LOS: 13 days   WILDMAN-TOBRINER, BEN 08/21/2011, 11:38 AM

## 2011-08-21 NOTE — Progress Notes (Signed)
Internal Medicine Attending  Date: 08/21/2011  Patient name: [name] Medical record number: [id number] Date of birth: 07-16-1945 Age: 66 y.o. Gender: male  I saw and evaluated the patient. I reviewed the resident's note by Dr. Abner Greenspan and I agree with the resident's findings and plans as documented in his progress note.  [name] feels exhausted. He also notes some slight increase in his shortness of breath. Although he has pain all over, he's unable to be more specific. Physical exam is notable for slight increase in the right posterior lung field inspiratory crackles. Dr. Melburn Popper astutely suggested this may be secondary to the removal of the biventricular pacemaker. I agree with the housestaff's plan to treat with a dose of Lasix and reassess. We will also stop the Zosyn, and treat him with a low dose Ambien tablet this evening in hopes of improving his sleep hygiene and overall sense of well-being.  We will continue the antifungal.

## 2011-08-21 NOTE — Progress Notes (Signed)
Medical Student Daily Progress Note  Subjective: Patient doesn't feel too great today. He is starting to feel the effects of not having his biventricular pacemaker anymore as well as the effects of the emboli in his lungs of the candida. He is breathing faster and harder than normal and complains of a little hip pain as well. According to his wife his pain is being managed well and he is more alert today and not as drowsy as yesterday. He has been seen by PT this morning who got him up and out of bed ambulating the hallway. He was able to ambulate well but was quickly out of breath and very tired afterwards. His wife is monitoring any changes in him and for now she says his pain is managed well, and that he does have some minor swelling in his feet, as would be expected in this setting. On exam he is resting in bed uncomfortably, with occasional moans and labored breathing.  Objective: Vital signs in last 24 hours: Filed Vitals:   08/20/11 2134 08/21/11 0500 08/21/11 0611 08/21/11 1018  BP: 149/66  150/63   Pulse: 84  79 104  Temp: 97.4 F (36.3 C)  99.1 F (37.3 C)   TempSrc: Oral  Oral   Resp: 20  20   Height:      Weight:  93.8 kg (206 lb 12.7 oz)    SpO2: 98%  98%    Weight change:   Intake/Output Summary (Last 24 hours) at 08/21/11 1116 Last data filed at 08/21/11 0900  Gross per 24 hour  Intake    480 ml  Output      0 ml  Net    480 ml   Physical Exam: BP 150/63  Pulse 104  Temp(Src) 99.1 F (37.3 C) (Oral)  Resp 20  Ht 5\' 10"  (1.778 m)  Wt 93.8 kg (206 lb 12.7 oz)  BMI 29.67 kg/m2  SpO2 98% General appearance: alert, cooperative and no distress Head: Normocephalic, without obvious abnormality, atraumatic Neck: no adenopathy, no carotid bruit, no JVD, supple, symmetrical, trachea midline and thyroid not enlarged, symmetric, no tenderness/mass/nodules Back: symmetric, no curvature. ROM normal. No CVA tenderness. Lungs: rales RLL and RML and coarse breath sounds B/L;  dyspnea and tachypnea Chest wall: no tenderness Heart: regular rate and rhythm, S1, S2 normal, no murmur, click, rub or gallop Abdomen: soft, non-tender; bowel sounds normal; no masses,  no organomegaly Extremities: edema minor in the B/L feet and left arm IV infiltrated TTT Skin: Skin color, texture, turgor normal. No rashes or lesions Neurologic: Grossly normal Lab Results: Basic Metabolic Panel:  Lab 08/20/11 1191 08/20/11 0605 08/18/11 0515 08/16/11 0445 08/15/11 1730  NA -- 134* 132* -- --  K -- 4.0 4.1 -- --  CL -- 98 99 -- --  CO2 -- 28 23 -- --  GLUCOSE -- 103* 110* -- --  BUN -- 11 12 -- --  CREATININE -- 0.73 0.75 -- --  CALCIUM -- 8.8 8.5 -- --  MG 1.9 -- -- 1.6 --  PHOS -- -- -- 3.1 5.5*   Liver Function Tests:  Lab 08/20/11 0605 08/16/11 0445  AST 27 26  ALT 17 12  ALKPHOS 196* 40  BILITOT 0.4 0.5  PROT 6.7 6.0  ALBUMIN 2.0* 2.1*   No results found for this basename: LIPASE:2,AMYLASE:2 in the last 168 hours No results found for this basename: AMMONIA:2 in the last 168 hours CBC:  Lab 08/21/11 0540 08/20/11 0605  WBC 6.8  9.8  NEUTROABS 5.0 7.8*  HGB 8.2* 8.4*  HCT 26.2* 26.5*  MCV 82.9 84.1  PLT 220 201   Cardiac Enzymes: No results found for this basename: CKTOTAL:3,CKMB:3,CKMBINDEX:3,TROPONINI:3 in the last 168 hours BNP:  Lab 08/17/11 0415  PROBNP 4005.0*   D-Dimer: No results found for this basename: DDIMER:2 in the last 168 hours CBG:  Lab 08/20/11 1644 08/15/11 1819  GLUCAP 126* 113*   Hemoglobin A1C: No results found for this basename: HGBA1C in the last 168 hours Fasting Lipid Panel: No results found for this basename: CHOL,HDL,LDLCALC,TRIG,CHOLHDL,LDLDIRECT in the last 604 hours Thyroid Function Tests: No results found for this basename: TSH,T4TOTAL,FREET4,T3FREE,THYROIDAB in the last 168 hours Coagulation: No results found for this basename: LABPROT:4,INR:4 in the last 168 hours Anemia Panel: No results found for this basename:  VITAMINB12,FOLATE,FERRITIN,TIBC,IRON,RETICCTPCT in the last 168 hours Urine Drug Screen: Drugs of Abuse  No results found for this basename: labopia,  cocainscrnur,  labbenz,  amphetmu,  thcu,  labbarb    Alcohol Level: No results found for this basename: ETH:2 in the last 168 hours Urinalysis: No results found for this basename: COLORURINE:2,APPERANCEUR:2,LABSPEC:2,PHURINE:2,GLUCOSEU:2,HGBUR:2,BILIRUBINUR:2,KETONESUR:2,PROTEINUR:2,UROBILINOGEN:2,NITRITE:2,LEUKOCYTESUR:2 in the last 168 hours  Micro Results: Recent Results (from the past 240 hour(s))  FUNGUS CULTURE, BLOOD     Status: Normal   Collection Time   08/12/11  3:18 PM      Component Value Range Status Comment   Specimen Description BLOOD LEFT ARM   Final    Special Requests BOTTLES DRAWN AEROBIC AND ANAEROBIC 10CC   Final    Culture NO GROWTH 7 DAYS   Final    Report Status 08/21/2011 FINAL   Final   CULTURE, BLOOD (ROUTINE X 2)     Status: Normal   Collection Time   08/12/11  6:00 PM      Component Value Range Status Comment   Specimen Description BLOOD ARM RIGHT   Final    Special Requests BOTTLES DRAWN AEROBIC ONLY [facility]   Final    Culture  Setup Time [id number]   Final    Culture NO GROWTH 5 DAYS   Final    Report Status 08/19/2011 FINAL   Final   FUNGUS CULTURE, BLOOD     Status: Normal   Collection Time   08/12/11  6:00 PM      Component Value Range Status Comment   Specimen Description BLOOD RIGHT ARM   Final    Special Requests BOTTLES DRAWN AEROBIC ONLY 6CC   Final    Culture NO GROWTH 7 DAYS   Final    Report Status 08/21/2011 FINAL   Final   CULTURE, BLOOD (ROUTINE X 2)     Status: Normal   Collection Time   08/12/11  6:15 PM      Component Value Range Status Comment   Specimen Description BLOOD ARM LEFT   Final    Special Requests BOTTLES DRAWN AEROBIC AND ANAEROBIC 10CC   Final    Culture  Setup Time [id number]   Final    Culture NO GROWTH 5 DAYS   Final    Report Status 08/19/2011 FINAL   Final     CULTURE, BLOOD (ROUTINE X 2)     Status: Normal   Collection Time   08/13/11 12:40 AM      Component Value Range Status Comment   Specimen Description BLOOD RIGHT ARM   Final    Special Requests BOTTLES DRAWN AEROBIC AND ANAEROBIC 5CC   Final    Culture  Setup  Time [id number]   Final    Culture     Final    Value: STAPHYLOCOCCUS SPECIES (COAGULASE NEGATIVE)     Note: THE SIGNIFICANCE OF ISOLATING THIS ORGANISM FROM A SINGLE SET OF BLOOD CULTURES WHEN MULTIPLE SETS ARE DRAWN IS UNCERTAIN. PLEASE NOTIFY THE MICROBIOLOGY DEPARTMENT WITHIN ONE WEEK IF SPECIATION AND SENSITIVITIES ARE REQUIRED.     Note: Gram Stain Report Called to,Read Back By and Verified With: NADINE WELLINGTON 08/14/2011 7:51AM YIMSU   Report Status 08/15/2011 FINAL   Final   CULTURE, BLOOD (ROUTINE X 2)     Status: Normal   Collection Time   08/13/11 12:43 AM      Component Value Range Status Comment   Specimen Description BLOOD RIGHT HAND   Final    Special Requests BOTTLES DRAWN AEROBIC AND ANAEROBIC [facility]   Final    Culture  Setup Time [id number]   Final    Culture     Final    Value: PROPIONIBACTERIUM SPECIES     Note: Gram Stain Report Called to,Read Back By and Verified With: Sallee Lange RN on 08/17/11 at 21:42 by Christie Nottingham PREVIOUSLY REPORTED AS NO GROWTH 5 DAYS CORRECTED RESULTS CALLED TO: SAMANTHA POWELL ON 3700 AT [id number] CASTILL[place] C   Report Status 08/20/2011 FINAL   Final   AFB CULTURE, BLOOD     Status: Normal (Preliminary result)   Collection Time   08/13/11 12:45 AM      Component Value Range Status Comment   Specimen Description BLOOD RIGHT HAND   Final    Special Requests 5CC   Final    Culture     Final    Value: CULTURE WILL BE EXAMINED FOR 6 WEEKS BEFORE ISSUING A FINAL REPORT   Report Status PENDING   Incomplete   WOUND CULTURE     Status: Normal   Collection Time   08/15/11  4:08 PM      Component Value Range Status Comment   Specimen Description WOUND   Final    Special Requests INFECTED  PACING LEAD   Final    Gram Stain     Final    Value: NO WBC SEEN     NO SQUAMOUS EPITHELIAL CELLS SEEN     NO ORGANISMS SEEN   Culture FEW CANDIDA ALBICANS   Final    Report Status 08/18/2011 FINAL   Final   FUNGUS CULTURE W SMEAR     Status: Normal (Preliminary result)   Collection Time   08/15/11  4:08 PM      Component Value Range Status Comment   Specimen Description WOUND   Final    Special Requests INFECTED PACING LEAD   Final    Fungal Smear FEW HYPHAE   Final    Culture YEAST CONSISTENT WITH CANDIDA SPECIES   Final    Report Status PENDING   Incomplete   WOUND CULTURE     Status: Normal   Collection Time   08/15/11  4:10 PM      Component Value Range Status Comment   Specimen Description WOUND   Final    Special Requests ICD DEVICE POCKET SWAB REC'D   Final    Gram Stain     Final    Value: NO WBC SEEN     NO SQUAMOUS EPITHELIAL CELLS SEEN     NO ORGANISMS SEEN   Culture NO GROWTH 2 DAYS   Final    Report Status 08/18/2011 FINAL   Final  FUNGUS CULTURE W SMEAR     Status: Normal (Preliminary result)   Collection Time   08/15/11  4:10 PM      Component Value Range Status Comment   Specimen Description WOUND   Final    Special Requests ICD DEVICE POCKET SWAB REC'D   Final    Fungal Smear NO YEAST OR FUNGAL ELEMENTS SEEN   Final    Culture CULTURE IN PROGRESS FOR FOUR WEEKS   Final    Report Status PENDING   Incomplete   CULTURE, BLOOD (ROUTINE X 2)     Status: Normal (Preliminary result)   Collection Time   08/16/11  4:05 AM      Component Value Range Status Comment   Specimen Description BLOOD RIGHT WRIST   Final    Special Requests BOTTLES DRAWN AEROBIC ONLY 1CC   Final    Culture  Setup Time [id number]   Final    Culture     Final    Value:        BLOOD CULTURE RECEIVED NO GROWTH TO DATE CULTURE WILL BE HELD FOR 5 DAYS BEFORE ISSUING A FINAL NEGATIVE REPORT   Report Status PENDING   Incomplete   CULTURE, BLOOD (ROUTINE X 2)     Status: Normal (Preliminary  result)   Collection Time   08/16/11  4:15 AM      Component Value Range Status Comment   Specimen Description BLOOD RIGHT HAND   Final    Special Requests BOTTLES DRAWN AEROBIC ONLY 2CC   Final    Culture  Setup Time [id number]   Final    Culture     Final    Value:        BLOOD CULTURE RECEIVED NO GROWTH TO DATE CULTURE WILL BE HELD FOR 5 DAYS BEFORE ISSUING A FINAL NEGATIVE REPORT   Report Status PENDING   Incomplete   CLOSTRIDIUM DIFFICILE BY PCR     Status: Normal   Collection Time   08/20/11 12:48 PM      Component Value Range Status Comment   C difficile by pcr NEGATIVE  NEGATIVE  Final    Studies/Results: Dg Chest 2 View  08/20/2011  *RADIOLOGY REPORT*  Clinical Data: Fever and cough.  Shortness of breath.  CHEST - 2 VIEW  Comparison: Chest x-ray 08/18/2011.  Findings: Multifocal interstitial and airspace opacities are again seen scattered throughout the lungs bilaterally, most pronounced throughout the right mid and lower lungs, as well as the left lower lobe.  There is blunting of the right costophrenic sulcus, compatible with a small right-sided pleural effusion.  No definite left-sided pleural effusion is noted.  Mild pulmonary venous congestion.  Heart size is mildly enlarged (unchanged). The patient is rotated to the left on today's exam, resulting in distortion of the mediastinal contours and reduced diagnostic sensitivity and specificity for mediastinal pathology.  Atherosclerotic calcifications are noted within the arch of the aorta. New right upper extremity PICC with tip terminating in the right atrium. Status post median sternotomy for CABG with LIMA.  IMPRESSION: 1. New right upper extremity PICC with tip terminating in the right atrium. 2.  Allowing for slight differences in patient positioning and depth of inspiration, the radiographic appearance of chest is otherwise essentially unchanged, as above.  Original Report Authenticated By: Florencia Reasons, M.D.   Dg Bone  Survey Met  08/19/2011  *RADIOLOGY REPORT*  Clinical Data: M spike on SPEP and immune suppression, evaluate for lytic lesions of multiple myeloma  METASTATIC BONE SURVEY  Comparison: Chest CT - 08/18/2011; CT abdomen pelvis - 07/18/2011; right hip CT - 08/10/2011  Findings:  There is a possible 1.2 x 2.2 cm lucency within the vertex of the calvarium.  Otherwise, no discrete lytic lesions are identified.  Degenerative change within the lumbar spine, worse at the L1 - L2 and L4 - L5. Multilevel cervical spine degenerative change.  Post mediastinotomy and CABG.  Surgical clips overlying the medial aspect of the right upper thigh. Vascular calcifications.  IMPRESSION:  Possible 2.2 cm lucency within the vertex of the calvarium. Further evaluation with head CT of brain MRI may be performed as clinically indicated. Otherwise, no additional lucent lesions identified.  Original Report Authenticated By: Waynard Reeds, M.D.   Medications:  I have reviewed the patient's current medications. Prior to Admission:  Prescriptions prior to admission  Medication Sig Dispense Refill  . Ascorbic Acid (VITAMIN C) 100 MG tablet Take 100 mg by mouth daily.      . calcium carbonate (TUMS - DOSED IN MG ELEMENTAL CALCIUM) 500 MG chewable tablet Chew 1 tablet by mouth 3 (three) times daily.      . diphenhydramine-acetaminophen (TYLENOL PM) 25-500 MG TABS Take 1 tablet by mouth at bedtime as needed. For pain      . esomeprazole (NEXIUM) 40 MG capsule Take 40 mg by mouth 2 (two) times daily.       . famotidine (PEPCID) 10 MG tablet Take 10 mg by mouth 2 (two) times daily.      . Ibuprofen 200 MG CAPS Take 600-800 mg by mouth every 6 (six) hours as needed. Depending on fever      . metoprolol (LOPRESSOR) 50 MG tablet Take 25 mg by mouth daily. Takes in am      . Multiple Vitamins-Minerals (MULTIVITAMIN) tablet Take 1 tablet by mouth daily with breakfast.   30 tablet    . Phenylephrine-Acetaminophen 5-325 MG TABS Take 1 tablet  by mouth every 4 (four) hours as needed. For sinus headache      . traMADol (ULTRAM) 50 MG tablet Take 1 tablet (50 mg total) by mouth every 6 (six) hours as needed for pain.  20 tablet  0  . DISCONTD: acetaminophen (TYLENOL) 500 MG tablet Take 500 mg by mouth every 6 (six) hours as needed. For pain.      [name] DISCONTD: Aspirin-Salicylamide-Caffeine (BC HEADACHE POWDER PO) Take 1 Package by mouth daily as needed. For headaches.      [name] DISCONTD: Flaxseed, Linseed, 1000 MG CAPS Take 1 capsule by mouth daily.       Anti-infectives     Start     Dose/Rate Route Frequency Ordered Stop   08/15/11 2200   piperacillin-tazobactam (ZOSYN) IVPB 3.375 g  Status:  Discontinued        3.375 g 12.5 mL/hr over 240 Minutes Intravenous 3 times per day 08/15/11 1758 08/21/11 1039   08/15/11 1830   vancomycin (VANCOCIN) 1,250 mg in sodium chloride 0.9 % 250 mL IVPB  Status:  Discontinued        1,250 mg 166.7 mL/hr over 90 Minutes Intravenous Every 12 hours 08/15/11 1758 08/18/11 1452   08/15/11 1535   gentamycin 80 mg in 0.9% normal saline 250 mL irrigation  Status:  Discontinued          As needed 08/15/11 1535 08/15/11 1608   08/15/11 1515   gentamycin 80 mg in 0.9% normal saline 250 mL irrigation  Status:  Discontinued  1 application Irrigation  Once 08/15/11 1507 08/15/11 1509   08/15/11 1515   gentamicin (GARAMYCIN) 80 mg in sodium chloride irrigation 0.9 % 500 mL irrigation         Irrigation Once 08/15/11 1510     08/14/11 1500   gentamicin (GARAMYCIN) 80 mg in sodium chloride irrigation 0.9 % 500 mL irrigation  Status:  Discontinued        80 mg Irrigation On call 08/14/11 1412 08/15/11 1819   08/14/11 1500   ceFAZolin (ANCEF) IVPB 2 g/50 mL premix        2 g 100 mL/hr over 30 Minutes Intravenous On call 08/14/11 1412 08/15/11 1430   08/13/11 1200   micafungin (MYCAMINE) 150 mg in sodium chloride 0.9 % 100 mL IVPB     Comments: Wait until fungal blood cultures are drawn before starting        150 mg 100 mL/hr over 1 Hours Intravenous Daily 08/13/11 0948     08/13/11 1000   fluconazole (DIFLUCAN) tablet 100 mg  Status:  Discontinued        100 mg Oral Daily 08/12/11 1125 08/13/11 0948   08/12/11 1200   fluconazole (DIFLUCAN) tablet 200 mg        200 mg Oral Daily 08/12/11 1125 08/12/11 1426         Scheduled Meds:    . antiseptic oral rinse  15 mL Mouth Rinse QID  . aspirin EC  81 mg Oral Daily  . chlorhexidine  15 mL Mouth/Throat BID  . enoxaparin (LOVENOX) injection  40 mg Subcutaneous Q24H  . feeding supplement  237 mL Oral BID BM  . furosemide  40 mg Oral Daily  . gentamicin irrigation   Irrigation Once  . metoprolol  25 mg Oral BID  . micafungin [facility]) IV  150 mg Intravenous Daily  . mulitivitamin with minerals  1 tablet Oral Daily  . pantoprazole  40 mg Oral Q1200  . polyethylene glycol  17 g Oral Daily  . 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 Patient also reported no subjective fever overnight as well. He is s/p ICU 5/16-5/18 2/2 hypotension/tachycardia/desats/respi failure requiring ET intubation and pressors on 5/16-5/17. FUO most likely caused by his Candida seeding to his heart and now embolized to his lung, which is being aggressively treated.  Negative labs to date: uric acid, HIV, RPR, Cryptococcal Ag, CT hip, cyclic citrul Ab IgG, Quantiferon gold, Chlamydia, Brucellosis, HLA B27  Pending tests: Q fever  Cultures positive for: C. albicans (5/16), CoagNeg Staph species (5/14), Propionibacterium species(5/14)--contaminate - Appreciate ID, GI, cardiology, opthomology assistance in the care and management of this patient  - continue micafungin (5/14>>) x 6 weeks, then will transition to  fluconazole  - PICC was successfully inserted 08/19/11  - vanc (5/16-5/19) - zosyn (5/16 - 5/22) - PT recommends HH PT/RN - will consult CM to set up [facility] PT/RN for when he is ready for discharge  Pulmonary infiltrate - CT angio chest (5/19) reveals massive right PE in RLL, moderate R pleural effusion, possible right interstitial infiltrate 2/2 infarction, and small patchy infiltrate in superior segment of LLL (infarction 2/2 embolus). Findings are related to embolization of ICD mass during removal.  -patient has dyspnea and tachypnea worse today; will continue to monitor and repeat CXR if gets  worse -CXR unchanged from previous findings - lasix 40 mg x 1 given to help with volume overload - may repeat CXR if worsens  Monoclonal gammopathy - There is the high likelihood that this increase increase is due to his acute problems and the normal physiological response to his infection. Outpatient follow-up is recommended after he resolves from this large acute episode with retesting of his SPEP and QIG with IF.  Admission CMP revealed protein gap of 5 (total protein - albumin). UPEP revealed increased kappa chains (28.2) in urine (kappa/lambda ratio = 20.58). 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 His calcium remains WNL (8.5) and SCr =0.75 and no s/s of aching bone pains. IFE revealed increased IgA (537) and increased IgM (723). These could be increased acutely for his esophageal disease (IgA in mucosa) and the infection in his heart (IgM acute response to C. albicans and possible coag neg staph in heart vegetation that has now embolized to R PA).  -Appreciate hematology consult in the management of this patient  -Bone survey reveals possible 2cm lucency in the vertex of calvarium however this could also be a variation of normal in older    patients; rest of survey was negative for lytic lesions or signs for MM -he will be retested after this episode resolves and if SPEP  still abnormal then will refer to hematology at that time  GERD/Esophageal stricture - He is s/p esophageal dilation on 08/12/11. He has had a history of esophageal dilation s/p strictures and candidiasis and poor po intake.  -Regular diet, well tolerated   Ischemic cardiomyopathy - Stable. 2D echo 08/15/11 reveals LVEF = 25% and diffuse hypokinesis. Mild-moderate MVR, mildly dilated LA, mild TVR, and paradoxical septal motion.  -Appreciate cardiology recommendations.  -continue metoprolol BID  -continue ASA  - giving lasix 40 mg x 1 today for volume overload  Normocytic anemia - Hgb =8.2, MCV =82.9. This is acute and has developed since March when hemoglobin was 14. Anemia panel revealing: Iron = 21, Sat ratio = 9, TIBC =243, Ferritin = 597, Retic count =1.9, FOBT negative. Smear revealed mild anemia with normal morphology. Still consistent with AOCD given that TIBC is normal (elevated in IDA) and ferritin high although could still be a component of acute phase reactant. Pt had last colonoscopy in 2012 showing sigmoid and descending diverticula (but no recurrent polyps - last noted to have hyperplastic polyps in 2001).   Thrombocytopenia - PLTC = 220. Likely related to underlying gammopathy. Smear also reveals thrombocytopenia. His PLTC has begun trending upwards as well over the last couple of days.  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 Multiple causes for this include extension of Candida infection vs. Vitamin B2 deficiency (less likely) vs. Iron deficiency anemia (AOCD more likely at this point), all of which are treatable conditions in this patient. As final diagnosis nears closer, treatment options for this will narrow as well.  -likely will resolve as his esophageal candida resolves completely   Constipation -  He has had spells of decreased po intake during his spells of dysphagia. He has been on a regular diet the last several days with no problems of constipation anymore. His episodes of loose, watery diarrhea the past day or  two have resolved.  -resolved  -continue miralax as needed  -C. Diff NEGATIVE   CAD h/o CABG x 4 (2003) - Stable. CE negative x 3 on admission.  -continue metoprolol  -continue ASA  -no statin, ACEi, or ARB 2/2 allergy   DVT ppx - Lovenox   LOS: 13 days   This is a Psychologist, occupational Note.  The care of the patient was discussed with Dr. Robinette Haines and the assessment and plan formulated with their assistance.  Please see their attached note for official documentation of the daily encounter.  Lewie Chamber 08/21/2011, 11:16 AM

## 2011-08-21 NOTE — Progress Notes (Signed)
PROGRESS NOTE  Subjective:   Pt feels better.  Still a little sleepy.   CT angio of the chest has found a "pulmonary embolus".  I am fairly sure that this is the vegetation and not a venous thromboembolus.    He complains of worsening dyspnea over the past 2 days.  His fevers have resolved.   Objective:    Vital Signs:   Temp:  [97.4 F (36.3 C)-99.1 F (37.3 C)] 99.1 F (37.3 C) (05/22 0611) Pulse Rate:  [79-104] 104  (05/22 1018) Resp:  [20] 20  (05/22 0611) BP: (144-150)/(63-69) 150/63 mmHg (05/22 0611) SpO2:  [98 %] 98 % (05/22 0611) Weight:  [206 lb 12.7 oz (93.8 kg)] 206 lb 12.7 oz (93.8 kg) (05/22 0500)  Last BM Date: 08/20/11   24-hour weight change: Weight change:   Weight trends: Filed Weights   08/17/11 0500 08/19/11 0500 08/21/11 0500  Weight: 201 lb 11.5 oz (91.5 kg) 205 lb 0.4 oz (93 kg) 206 lb 12.7 oz (93.8 kg)    Intake/Output:  05/21 0701 - 05/22 0700 In: 480 [P.O.:480] Out: -  Total I/O In: 240 [P.O.:240] Out: -    Physical Exam: BP 150/63  Pulse 104  Temp(Src) 99.1 F (37.3 C) (Oral)  Resp 20  Ht 5\' 10"  (1.778 m)  Wt 206 lb 12.7 oz (93.8 kg)  BMI 29.67 kg/m2  SpO2 98%  General: Vital signs reviewed and noted. Well-developed, well-nourished, in no acute distress; alert, appropriate and cooperative throughout examination.  Head: Normocephalic, atraumatic.  Eyes: conjunctivae/corneas clear. PERRL, EOM's intact.   Throat: normal  Neck: Supple. Normal carotids. No JVD  Lungs:  Clear anteriorly  Heart: Regular rate,  With normal  S1 S2. No murmurs, gallops or rubs  Abdomen:  Soft, non-tender, non-distended with normoactive bowel sounds. No hepatomegaly. No rebound/guarding. No abdominal masses.  Extremities: Distal pedal pulses are 2+ .  No edema.    Neurologic: A&O X3, CN II - XII are grossly intact. Motor strength is 5/5 in the all 4 extremities. sleepy  Psych: Responds to questions appropriately with normal affect.     Labs: BMET:  Basename 08/20/11 1055 08/20/11 0605  NA -- 134*  K -- 4.0  CL -- 98  CO2 -- 28  GLUCOSE -- 103*  BUN -- 11  CREATININE -- 0.73  CALCIUM -- 8.8  MG 1.9 --  PHOS -- --    Liver function tests:  [facility] 08/20/11 0605  AST 27  ALT 17  ALKPHOS 196*  BILITOT 0.4  PROT 6.7  ALBUMIN 2.0*   No results found for this basename: LIPASE:2,AMYLASE:2 in the last 72 hours  CBC:  Basename 08/21/11 0540 08/20/11 0605  WBC 6.8 9.8  NEUTROABS 5.0 7.8*  HGB 8.2* 8.4*  HCT 26.2* 26.5*  MCV 82.9 84.1  PLT 220 201     Tele:  NSR   Medications:    Infusions:    Scheduled Medications:    . antiseptic oral rinse  15 mL Mouth Rinse QID  . aspirin EC  81 mg Oral Daily  . chlorhexidine  15 mL Mouth/Throat BID  . enoxaparin (LOVENOX) injection  40 mg Subcutaneous Q24H  . feeding supplement  237 mL Oral BID BM  . furosemide  40 mg Oral Daily  . gentamicin irrigation   Irrigation Once  . metoprolol  25 mg Oral BID  . micafungin [facility]) IV  150 mg Intravenous Daily  . mulitivitamin with minerals  1 tablet Oral Daily  .  pantoprazole  40 mg Oral Q1200  . piperacillin-tazobactam (ZOSYN)  IV  3.375 g Intravenous Q8H  . polyethylene glycol  17 g Oral Daily    Assessment/ Plan:    1. Chronic systolic CHF:  Continue current meds.   I think some of his dyspnea over the past 2 days is related to the fact that he no longer has his Bi-Ventricular pacer and may be having some worsening CHF.  This should resolved when we are able to replace the Bi-V ICD.   We will arrange for him to get another BI-V ICD in several months ( after his blood is sterilized)  Another possible cause of his worsening dyspnea are his fungal emboli.  He has CT angio evidence that the large vegetation embolized to his RLL.  He also has multiple nodules that are also likely smaller fungal emboli.  He has a moderate R pleural effusion.  2. ?PE:  This is likely the fungal vegetation but I think it  would be prudent to do a venous duplex of his legs to R/O DVT.  He has SCDs on at present.  3. Fungal infection:  Plans per Int. Med.  Length of Stay: 13  Vesta Mixer, Montez Hageman., MD, [facility] 08/21/2011, 10:32 AM Office [phone] Pager [phone]

## 2011-08-21 NOTE — Evaluation (Signed)
Physical Therapy Evaluation Patient Details Name: [name] MRN: [id number] DOB: [birth year] Today's Date: 08/21/2011 Time: 4967-5916 PT Time Calculation (min): 25 min  PT Assessment / Plan / Recommendation Clinical Impression  Patient presents with fungemia and fever/weight loss. He was noted to have infective endocarditis and his ICD was removed at which point candida was identified on his lead. This subsequently resulted in a PE. He presents today with a decline in functional activity tolerance and balance and would benefit from [facility] PT upon his return home in addition to PT in the acute setting to maximize safe and independent function.     PT Assessment  Patient needs continued PT services    Follow Up Recommendations  Home health PT;Supervision/Assistance - 24 hour    Barriers to Discharge  None      lEquipment Recommendations  3 in 1 bedside comode;Rolling walker with 5" wheels    Recommendations for Other Services  N/A  Frequency Min 3X/week    Precautions / Restrictions Precautions Precautions: Fall   Pertinent Vitals/Pain DOE 2/4 on 3 liters of O2 throughout treatment. Generalized pain.       Mobility  Bed Mobility Bed Mobility: Supine to Sit;Sitting - Scoot to Edge of Bed;Sit to Supine Supine to Sit: 6: Modified independent (Device/Increase time) Sitting - Scoot to Edge of Bed: 6: Modified independent (Device/Increase time) Sit to Supine: 6: Modified independent (Device/Increase time) Transfers Transfers: Sit to Stand;Stand to Sit Sit to Stand: 4: Min guard;With upper extremity assist;From bed Stand to Sit: 4: Min guard;With upper extremity assist;To bed Details for Transfer Assistance: Education in correct hand placement to and from device. Tendency for posterior bias with standing and increased postural sway unless has bilateral upper extremity support. Ambulation/Gait Ambulation/Gait Assistance: 4: Min guard Ambulation Distance (Feet): 180  Feet Assistive device: Rolling walker Ambulation/Gait Assistance Details: Patient required cues for safe use of walker as with turning tilts the walker and wheels not in contact with ground at times. Patient has an old left hand injury and can not grip walker on this side, but he is able to steer walker well by resting it on the hand rest.  Gait Pattern: Step-through pattern;Decreased stride length     PT Diagnosis: Difficulty walking  PT Problem List: Decreased activity tolerance;Decreased balance;Decreased mobility;Decreased knowledge of use of DME;Pain PT Treatment Interventions: Gait training;DME instruction;Functional mobility training;Therapeutic activities;Therapeutic exercise;Balance training;Patient/family education   PT Goals Acute Rehab PT Goals PT Goal Formulation: With patient Time For Goal Achievement: 08/28/11 Potential to Achieve Goals: Good Pt will go Sit to Stand: with upper extremity assist;with supervision PT Goal: Sit to Stand - Progress: Goal set today Pt will go Stand to Sit: with supervision;with upper extremity assist PT Goal: Stand to Sit - Progress: Goal set today Pt will Stand: with supervision;1 - 2 min;with no upper extremity support PT Goal: Stand - Progress: Goal set today Pt will Ambulate: >150 feet;with supervision;with least restrictive assistive device PT Goal: Ambulate - Progress: Goal set today  Visit Information  Last PT Received On: 08/21/11 Assistance Needed: +1    Subjective Data  Subjective: Patient reports keen to walk Patient Stated Goal: Home soon   Prior Functioning  Home Living Lives With: Spouse Available Help at Discharge: Family;Available 24 hours/day Type of Home: House Home Access: Ramped entrance Home Layout: One level Bathroom Shower/Tub: Health visitor: Standard Home Adaptive Equipment: Wheelchair - manual Additional Comments: ? walker Prior Function Level of Independence: Independent Driving:  Yes Vocation: Works  at home Comments: Has chickens. Able do yardwork etc.  Communication Communication: No difficulties    Cognition  Overall Cognitive Status: Appears within functional limits for tasks assessed/performed Arousal/Alertness: Awake/alert Orientation Level: Appears intact for tasks assessed Behavior During Session: [facility] for tasks performed    Extremity/Trunk Assessment Right Lower Extremity Assessment RLE ROM/Strength/Tone: [facility] for tasks assessed RLE Coordination: WFL - gross/fine motor Left Lower Extremity Assessment LLE ROM/Strength/Tone: WFL for tasks assessed LLE Coordination: WFL - gross/fine motor Trunk Assessment Trunk Assessment: Normal   Balance Static Standing Balance Static Standing - Balance Support: Bilateral upper extremity supported Static Standing - Level of Assistance: 5: Stand by assistance  End of Session PT - End of Session Equipment Utilized During Treatment: Gait belt Activity Tolerance: Patient tolerated treatment well Patient left: in bed;with call bell/phone within reach;with family/visitor present Nurse Communication: Mobility status   Edwyna Perfect, PT  Pager [phone]  08/21/2011, 8:57 AM

## 2011-08-21 NOTE — Progress Notes (Signed)
08-21-11 1115 Aili Casillas Graves-Bigelow, RN,BSN [phone] Pt was discussed in Long length of stay meeting today.   

## 2011-08-22 DIAGNOSIS — R509 Fever, unspecified: Secondary | ICD-10-CM

## 2011-08-22 DIAGNOSIS — B376 Candidal endocarditis: Secondary | ICD-10-CM

## 2011-08-22 LAB — COMPREHENSIVE METABOLIC PANEL
ALT: 13 U/L (ref 0–53)
Albumin: 1.9 g/dL — ABNORMAL LOW (ref 3.5–5.2)
Alkaline Phosphatase: 80 U/L (ref 39–117)
BUN: 10 mg/dL (ref 6–23)
Chloride: 96 mEq/L (ref 96–112)
Glucose, Bld: 143 mg/dL — ABNORMAL HIGH (ref 70–99)
Potassium: 3.6 mEq/L (ref 3.5–5.1)
Sodium: 133 mEq/L — ABNORMAL LOW (ref 135–145)
Total Bilirubin: 0.3 mg/dL (ref 0.3–1.2)
Total Protein: 6.6 g/dL (ref 6.0–8.3)

## 2011-08-22 LAB — CULTURE, BLOOD (ROUTINE X 2)
Culture  Setup Time: 201305171121
Culture: NO GROWTH

## 2011-08-22 LAB — BLOOD GAS, ARTERIAL
Acid-Base Excess: 4.3 mmol/L — ABNORMAL HIGH (ref 0.0–2.0)
O2 Content: 1 L/min
O2 Saturation: 97.6 %
TCO2: 28.2 mmol/L (ref 0–100)
pCO2 arterial: 32.6 mmHg — ABNORMAL LOW (ref 35.0–45.0)
pO2, Arterial: 85.5 mmHg (ref 80.0–100.0)

## 2011-08-22 LAB — CBC
HCT: 27.3 % — ABNORMAL LOW (ref 39.0–52.0)
Hemoglobin: 8.9 g/dL — ABNORMAL LOW (ref 13.0–17.0)
MCHC: 32.6 g/dL (ref 30.0–36.0)
RDW: 16.2 % — ABNORMAL HIGH (ref 11.5–15.5)
WBC: 4.9 10*3/uL (ref 4.0–10.5)

## 2011-08-22 LAB — IGG, IGA, IGM: IgM, Serum: 423 mg/dL — ABNORMAL HIGH (ref 41–251)

## 2011-08-22 MED ORDER — TRAZODONE HCL 50 MG PO TABS
50.0000 mg | ORAL_TABLET | Freq: Every evening | ORAL | Status: DC | PRN
  Filled 2011-08-22: qty 1

## 2011-08-22 MED ORDER — ACETAMINOPHEN 325 MG PO TABS
325.0000 mg | ORAL_TABLET | ORAL | Status: DC | PRN
  Administered 2011-08-22 – 2011-08-23 (×3): 650 mg via ORAL
  Filled 2011-08-22 (×3): qty 2

## 2011-08-22 MED ORDER — CLONAZEPAM 0.5 MG PO TABS
0.5000 mg | ORAL_TABLET | Freq: Two times a day (BID) | ORAL | Status: DC | PRN
  Administered 2011-08-22 (×2): 0.5 mg via ORAL
  Filled 2011-08-22 (×2): qty 1

## 2011-08-22 NOTE — Progress Notes (Signed)
Internal Medicine Attending  Date: 08/22/2011  Patient name: [name] Medical record number: [id number] Date of birth: [birth year] Age: 66 y.o. Gender: male  I saw and evaluated the patient. I reviewed the resident's note by Dr. Candy Sledge and I agree with the resident's findings and plans as documented in his progress note.  Mr. Knust was seen on rounds this AM.  Breathing improved after diuresis yesterday.  Still having trouble with sleep at night which I suspect is secondary to some poor sleep hygene.  Unclear what the source of the fever is.  Has TTE scheduled for tomorrow.  Will continue micafungin for at least 6 weeks pending sensitivities.  Will also continue to observe while spiking fevers.  Will temporarily need oxygen at home given his desaturation with exertion.

## 2011-08-22 NOTE — Progress Notes (Signed)
INFECTIOUS DISEASE PROGRESS NOTE  ID: [name] is a 66 y.o. male with   Principal Problem:  *Candidal endocarditis Active Problems:  Chronic systolic congestive heart failure  CONGESTIVE HEART FAILURE, LEFT  ICD - IN SITU  CAD (coronary artery disease)  FUO (fever of unknown origin)  Weight loss, unintentional  Normocytic anemia  Esophageal stricture  Thrombocytopenia  Multiple pulmonary nodules  Sepsis  Rigors  Septic embolism  Acute respiratory failure with hypoxia  Endocarditis, infective, acute/subacute in other disease  Subjective: continues to be fatigued, states he has pain all over with fevers. Fever this AM.   Abtx:  Anti-infectives     Start     Dose/Rate Route Frequency Ordered Stop   08/15/11 2200   piperacillin-tazobactam (ZOSYN) IVPB 3.375 g  Status:  Discontinued        3.375 g 12.5 mL/hr over 240 Minutes Intravenous 3 times per day 08/15/11 1758 08/21/11 1039   08/15/11 1830   vancomycin (VANCOCIN) 1,250 mg in sodium chloride 0.9 % 250 mL IVPB  Status:  Discontinued        1,250 mg 166.7 mL/hr over 90 Minutes Intravenous Every 12 hours 08/15/11 1758 08/18/11 1452   08/15/11 1535   gentamycin 80 mg in 0.9% normal saline 250 mL irrigation  Status:  Discontinued          As needed 08/15/11 1535 08/15/11 1608   08/15/11 1515   gentamycin 80 mg in 0.9% normal saline 250 mL irrigation  Status:  Discontinued        1 application Irrigation  Once 08/15/11 1507 08/15/11 1509   08/15/11 1515   gentamicin (GARAMYCIN) 80 mg in sodium chloride irrigation 0.9 % 500 mL irrigation         Irrigation Once 08/15/11 1510     08/14/11 1500   gentamicin (GARAMYCIN) 80 mg in sodium chloride irrigation 0.9 % 500 mL irrigation  Status:  Discontinued        80 mg Irrigation On call 08/14/11 1412 08/15/11 1819   08/14/11 1500   ceFAZolin (ANCEF) IVPB 2 g/50 mL premix        2 g 100 mL/hr over 30 Minutes Intravenous On call 08/14/11 1412 08/15/11 1430   08/13/11  1200   micafungin (MYCAMINE) 150 mg in sodium chloride 0.9 % 100 mL IVPB     Comments: Wait until fungal blood cultures are drawn before starting      150 mg 100 mL/hr over 1 Hours Intravenous Daily 08/13/11 0948     08/13/11 1000   fluconazole (DIFLUCAN) tablet 100 mg  Status:  Discontinued        100 mg Oral Daily 08/12/11 1125 08/13/11 0948   08/12/11 1200   fluconazole (DIFLUCAN) tablet 200 mg        200 mg Oral Daily 08/12/11 1125 08/12/11 1426          Medications:  Scheduled:   . antiseptic oral rinse  15 mL Mouth Rinse QID  . aspirin EC  81 mg Oral Daily  . chlorhexidine  15 mL Mouth/Throat BID  . enoxaparin (LOVENOX) injection  40 mg Subcutaneous Q24H  . feeding supplement  237 mL Oral BID BM  . furosemide  40 mg Oral Daily  . gentamicin irrigation   Irrigation Once  . metoprolol  25 mg Oral BID  . micafungin [facility]) IV  150 mg Intravenous Daily  . mulitivitamin with minerals  1 tablet Oral Daily  . pantoprazole  40 mg  Oral Q1200  . polyethylene glycol  17 g Oral Daily    Objective: Vital signs in last 24 hours: Temp:  [98.2 F (36.8 C)-101.4 F (38.6 C)] 99 F (37.2 C) (05/23 1000) Pulse Rate:  [90-115] 115  (05/23 1000) Resp:  [18-20] 18  (05/23 0639) BP: (144-157)/(66-78) 157/78 mmHg (05/23 0639) SpO2:  [94 %-100 %] 94 % (05/23 1000) Weight:  [93.577 kg (206 lb 4.8 oz)] 93.577 kg (206 lb 4.8 oz) (05/23 0639)   General appearance: alert, appears stated age and moderate distress Resp: rales bilaterally Cardio: regular rate and rhythm GI: normal findings: bowel sounds normal and soft, non-tender  Lab Results  Basename 08/22/11 1005 08/22/11 0545 08/21/11 0540 08/20/11 0605  WBC -- 4.9 6.8 --  HGB -- 8.9* 8.2* --  HCT -- 27.3* 26.2* --  NA 133* -- -- 134*  K 3.6 -- -- 4.0  CL 96 -- -- 98  CO2 27 -- -- 28  BUN 10 -- -- 11  CREATININE 0.65 -- -- 0.73  GLU -- -- -- --   Liver Panel  Basename 08/22/11 1005 08/20/11 0605  PROT 6.6 6.7    ALBUMIN 1.9* 2.0*  AST 21 27  ALT 13 17  ALKPHOS 80 196*  BILITOT 0.3 0.4  BILIDIR -- --  IBILI -- --   Sedimentation Rate No results found for this basename: ESRSEDRATE in the last 72 hours C-Reactive Protein No results found for this basename: CRP:2 in the last 72 hours  Microbiology: Recent Results (from the past 240 hour(s))  FUNGUS CULTURE, BLOOD     Status: Normal   Collection Time   08/12/11  3:18 PM      Component Value Range Status Comment   Specimen Description BLOOD LEFT ARM   Final    Special Requests BOTTLES DRAWN AEROBIC AND ANAEROBIC 10CC   Final    Culture NO GROWTH 7 DAYS   Final    Report Status 08/21/2011 FINAL   Final   CULTURE, BLOOD (ROUTINE X 2)     Status: Normal   Collection Time   08/12/11  6:00 PM      Component Value Range Status Comment   Specimen Description BLOOD ARM RIGHT   Final    Special Requests BOTTLES DRAWN AEROBIC ONLY [facility]   Final    Culture  Setup Time [id number]   Final    Culture NO GROWTH 5 DAYS   Final    Report Status 08/19/2011 FINAL   Final   FUNGUS CULTURE, BLOOD     Status: Normal   Collection Time   08/12/11  6:00 PM      Component Value Range Status Comment   Specimen Description BLOOD RIGHT ARM   Final    Special Requests BOTTLES DRAWN AEROBIC ONLY 6CC   Final    Culture NO GROWTH 7 DAYS   Final    Report Status 08/21/2011 FINAL   Final   CULTURE, BLOOD (ROUTINE X 2)     Status: Normal   Collection Time   08/12/11  6:15 PM      Component Value Range Status Comment   Specimen Description BLOOD ARM LEFT   Final    Special Requests BOTTLES DRAWN AEROBIC AND ANAEROBIC 10CC   Final    Culture  Setup Time [id number]   Final    Culture NO GROWTH 5 DAYS   Final    Report Status 08/19/2011 FINAL   Final   CULTURE, BLOOD (ROUTINE X 2)  Status: Normal   Collection Time   08/13/11 12:40 AM      Component Value Range Status Comment   Specimen Description BLOOD RIGHT ARM   Final    Special Requests BOTTLES DRAWN AEROBIC  AND ANAEROBIC 5CC   Final    Culture  Setup Time [id number]   Final    Culture     Final    Value: STAPHYLOCOCCUS SPECIES (COAGULASE NEGATIVE)     Note: THE SIGNIFICANCE OF ISOLATING THIS ORGANISM FROM A SINGLE SET OF BLOOD CULTURES WHEN MULTIPLE SETS ARE DRAWN IS UNCERTAIN. PLEASE NOTIFY THE MICROBIOLOGY DEPARTMENT WITHIN ONE WEEK IF SPECIATION AND SENSITIVITIES ARE REQUIRED.     Note: Gram Stain Report Called to,Read Back By and Verified With: NADINE WELLINGTON 08/14/2011 7:51AM YIMSU   Report Status 08/15/2011 FINAL   Final   CULTURE, BLOOD (ROUTINE X 2)     Status: Normal   Collection Time   08/13/11 12:43 AM      Component Value Range Status Comment   Specimen Description BLOOD RIGHT HAND   Final    Special Requests BOTTLES DRAWN AEROBIC AND ANAEROBIC 5CC   Final    Culture  Setup Time [id number]   Final    Culture     Final    Value: PROPIONIBACTERIUM SPECIES     Note: Gram Stain Report Called to,Read Back By and Verified With: Sallee Lange RN on 08/17/11 at 21:42 by Christie Nottingham PREVIOUSLY REPORTED AS NO GROWTH 5 DAYS CORRECTED RESULTS CALLED TO: SAMANTHA POWELL ON 3700 AT [id number] CASTILLO C   Report Status 08/20/2011 FINAL   Final   AFB CULTURE, BLOOD     Status: Normal (Preliminary result)   Collection Time   08/13/11 12:45 AM      Component Value Range Status Comment   Specimen Description BLOOD RIGHT HAND   Final    Special Requests 5CC   Final    Culture     Final    Value: CULTURE WILL BE EXAMINED FOR 6 WEEKS BEFORE ISSUING A FINAL REPORT   Report Status PENDING   Incomplete   WOUND CULTURE     Status: Normal   Collection Time   08/15/11  4:08 PM      Component Value Range Status Comment   Specimen Description WOUND   Final    Special Requests INFECTED PACING LEAD   Final    Gram Stain     Final    Value: NO WBC SEEN     NO SQUAMOUS EPITHELIAL CELLS SEEN     NO ORGANISMS SEEN   Culture FEW CANDIDA ALBICANS   Final    Report Status 08/18/2011 FINAL   Final   FUNGUS  CULTURE W SMEAR     Status: Normal (Preliminary result)   Collection Time   08/15/11  4:08 PM      Component Value Range Status Comment   Specimen Description WOUND   Final    Special Requests INFECTED PACING LEAD   Final    Fungal Smear FEW HYPHAE   Final    Culture CANDIDA ALBICANS   Final    Report Status PENDING   Incomplete   WOUND CULTURE     Status: Normal   Collection Time   08/15/11  4:10 PM      Component Value Range Status Comment   Specimen Description WOUND   Final    Special Requests ICD DEVICE POCKET SWAB REC'D   Final    Gram Stain  Final    Value: NO WBC SEEN     NO SQUAMOUS EPITHELIAL CELLS SEEN     NO ORGANISMS SEEN   Culture NO GROWTH 2 DAYS   Final    Report Status 08/18/2011 FINAL   Final   FUNGUS CULTURE W SMEAR     Status: Normal (Preliminary result)   Collection Time   08/15/11  4:10 PM      Component Value Range Status Comment   Specimen Description WOUND   Final    Special Requests ICD DEVICE POCKET SWAB REC'D   Final    Fungal Smear NO YEAST OR FUNGAL ELEMENTS SEEN   Final    Culture CULTURE IN PROGRESS FOR FOUR WEEKS   Final    Report Status PENDING   Incomplete   CULTURE, BLOOD (ROUTINE X 2)     Status: Normal   Collection Time   08/16/11  4:05 AM      Component Value Range Status Comment   Specimen Description BLOOD RIGHT WRIST   Final    Special Requests BOTTLES DRAWN AEROBIC ONLY 1CC   Final    Culture  Setup Time [id number]   Final    Culture NO GROWTH 5 DAYS   Final    Report Status 08/22/2011 FINAL   Final   CULTURE, BLOOD (ROUTINE X 2)     Status: Normal   Collection Time   08/16/11  4:15 AM      Component Value Range Status Comment   Specimen Description BLOOD RIGHT HAND   Final    Special Requests BOTTLES DRAWN AEROBIC ONLY [facility]   Final    Culture  Setup Time [id number]   Final    Culture NO GROWTH 5 DAYS   Final    Report Status 08/22/2011 FINAL   Final   CLOSTRIDIUM DIFFICILE BY PCR     Status: Normal   Collection Time    08/20/11 12:48 PM      Component Value Range Status Comment   C difficile by pcr NEGATIVE  NEGATIVE  Final     Studies/Results: Dg Chest 2 View  08/20/2011  *RADIOLOGY REPORT*  Clinical Data: Fever and cough.  Shortness of breath.  CHEST - 2 VIEW  Comparison: Chest x-ray 08/18/2011.  Findings: Multifocal interstitial and airspace opacities are again seen scattered throughout the lungs bilaterally, most pronounced throughout the right mid and lower lungs, as well as the left lower lobe.  There is blunting of the right costophrenic sulcus, compatible with a small right-sided pleural effusion.  No definite left-sided pleural effusion is noted.  Mild pulmonary venous congestion.  Heart size is mildly enlarged (unchanged). The patient is rotated to the left on today's exam, resulting in distortion of the mediastinal contours and reduced diagnostic sensitivity and specificity for mediastinal pathology.  Atherosclerotic calcifications are noted within the arch of the aorta. New right upper extremity PICC with tip terminating in the right atrium. Status post median sternotomy for CABG with LIMA.  IMPRESSION: 1. New right upper extremity PICC with tip terminating in the right atrium. 2.  Allowing for slight differences in patient positioning and depth of inspiration, the radiographic appearance of chest is otherwise essentially unchanged, as above.  Original Report Authenticated By: Florencia Reasons, M.D.     Assessment/Plan: Infective Endocarditis, ICD,  Candida seen on lead (08-15-11)  Massive R pulmonary embolus  MRSA PCR +  Day 11 antifungal therapy (micafungin)  Day 7 zosyn stopped yesterday  Agree with Dr Josem Kaufmann  and Raisch that this is most likely related to his esophageal candidiasis/repeated esophageal dilatations.  I am not sure why he has developed candidiasis in a seemingly normal immune host- CD4 is nl and Ig levels are norma.  Sensi/ID of candida pending, will most likely take several weeks as  has to be sent to off site lab (in [place]).  He is off zosyn since yesterday AM.  Would hold on d/c while he is febrile. Not clear of source- C diff (-) 5-21. Could be due to his lung infarct/embolims but would make this dx of exclusion.   [name] Infectious Diseases [phone] 08/22/2011, 1:01 PM   LOS: 14 days

## 2011-08-22 NOTE — Progress Notes (Signed)
Resident Co-sign Daily Note: I have seen the patient and reviewed the daily progress note by Lewie Chamber and discussed the care of the patient with them.  See below for documentation of my findings, assessment, and plans.  Subjective: Feeling better today. States breathing is improved. Is feeling anxious and felt like a sleeping pill we gave him last night did not work.  Objective: Vital signs in last 24 hours: Filed Vitals:   08/22/11 0110 08/22/11 0205 08/22/11 0639 08/22/11 1000  BP:   157/78   Pulse:   93 115  Temp: 101.2 F (38.4 C) 101.4 F (38.6 C) 99 F (37.2 C) 99 F (37.2 C)  TempSrc: Oral Oral Oral Oral  Resp:   18   Height:      Weight:   206 lb 4.8 oz (93.577 kg)   SpO2:   99% 94%   Physical Exam: General: resting in bed, NAD Cardiac: tachycardic, but regular, no rubs, murmurs or gallops Pulm: Bilateral crackles in the bilateral bases, improved from yesterday Ext. Warm and dry with no pedal edema  Lab Results: Reviewed and documented in Electronic Record Micro Results: Reviewed and documented in Electronic Record Studies/Results: Reviewed and documented in Electronic Record Medications: I have reviewed the patient's current medications. Scheduled Meds:   . antiseptic oral rinse  15 mL Mouth Rinse QID  . aspirin EC  81 mg Oral Daily  . chlorhexidine  15 mL Mouth/Throat BID  . enoxaparin (LOVENOX) injection  40 mg Subcutaneous Q24H  . feeding supplement  237 mL Oral BID BM  . furosemide  40 mg Oral Daily  . gentamicin irrigation   Irrigation Once  . metoprolol  25 mg Oral BID  . micafungin [facility]) IV  150 mg Intravenous Daily  . mulitivitamin with minerals  1 tablet Oral Daily  . pantoprazole  40 mg Oral Q1200  . 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 He was placed on micafungin on (5/14). 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 He was transferred to the ICU from the 16th through 18th requiring intubation for one day for respiratory depression. Ophthalmology consult was obtained and they stated that the cotton wool spots seen were more consistent with hypertension than septic emboli. They suggested followup as an outpatient. Will continue to treat with micafungin while we try and speciate his candida. Per ID this will take a while. CD4 count and an immunoglobulin studies normal. 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 No anticoagulation required for this reason. Will place on prophylactic doses of Lovenox. Pt is feeling less dyspneic today than yesterday but still has crackles on exam.  Likely a combination of known CHF and loss of synchronization with removal of the pacemaker and pulmonary embolic event leading to pleural effusion vs pulmonary  edema.  -- lasix 40mg  PO daily per cardiology  3) GERD/Esophageal stricture - He is s/p esophageal dilation on 08/12/11. Esophageal candidiasis may have been secondary to repeated dilations. The patient also always has a sugar lozenge in his mouth which could predispose to fungal infection. No difficulty with eating after EGD and  dilation.  -- Continue pantoprazole  -- Regular diet, well tolerated   4) Ischemic cardiomyopathy And CAD h/o CABG x 4 (2003) - Stable. CE negative x 3 on admission. 2D echo 08/15/11 reveals LVEF = 25% and diffuse hypokinesis. Mild-moderate MVR, mildly dilated LA, mild TVR, and paradoxical septal motion. Now has increased crackles and weight gain of greater than 10 pounds since admission. Likely that was fluid overloaded when he was hypotensive in the ICU. Will provide gentle diuresis.  -- Lasix 40 by mouth daily  -- Appreciate cardiology recommendations.  -- continue metoprolol BID  -- continue ASA  -- no statin 2/2 allergy   5) Weight loss, unintentional - improving with fluids and food. Weight loss was likely secondary to decreased by mouth intake and underlying infection. Now eating well.  -- Continue strawberry flavored Ensure  -- Appreciate nutrition assistance in management of this patient   6) Normocytic anemia - Hgb =9.1, MCV =82.5. This is acute and has developed since March when hemoglobin was 14. Anemia panel revealing: Iron = 21, Sat ratio = 9, TIBC =243, Ferritin = 597, Retic count =1.9, FOBT negative. Smear revealed mild anemia with normal morphology. Still consistent with AOCD given that TIBC is normal (elevated in IDA) and ferritin high although could still be a component of acute phase reactant. Pt had last colonoscopy in 2012 showing sigmoid and descending diverticula (but no recurrent polyps - last noted to have hyperplastic polyps in 2001). 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 UPEP = increased kappa chains (28.2) in urine with kappa/lambda ratio of 20.58). 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 Serum IFE revealed increased IgA (537) and increased IgM (723).Overall besides immunosuppression anemia and thrombocytopenia no overt clinical signs of multiple myeloma. He has no renal failure,  hypercalcemia, or lytic bone lesions seen on skeletal survey. 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 Will have patient repeat studies as outpatient when fungal infection is resolved.  -- Will arrange outpatient followup   8) Thrombocytopenia - Likely related to underlying gammopathy. Smear also reveals thrombocytopenia. Platelets ranging from 100s to 120s. Will put on prophylactic dose Lovenox for DVT prophylaxis. Plts improved today to 200s.   9) Diarrhea - C diff negative. Likely 2/2 antimicrobials wiping out normal flora. Will hold stool softerers   10) Debility - PT recommends HHPT, will arrange. Ordered HH equipment.  We will have the patient ambulate on room air to determine if he desats while walking. He may need home oxygen.  11) anxiety - poorly controlled, likely combination of air hunger from respiratory status and true anxiety. Will increase clonazepam to 0.5 mg twice a day  disposition -patient will be discharged to home with home health PT and RN for med management. Discharged likely tomorrow after TTE  DVT ppx - lovenox today     LOS: 14 days   Carine Nordgren 08/22/2011, 12:26 PM

## 2011-08-22 NOTE — Progress Notes (Signed)
Nutrition Follow-up  Patient s/p biventricular implantable cardioverter-defibrillator system extraction 5/17. Transferred out of ICU. PO intake at 75-100% per flowsheet records. Drinking Ensure Complete supplements.  Diet Order:  Regular  Meds: Scheduled Meds:   . antiseptic oral rinse  15 mL Mouth Rinse QID  . aspirin EC  81 mg Oral Daily  . chlorhexidine  15 mL Mouth/Throat BID  . enoxaparin (LOVENOX) injection  40 mg Subcutaneous Q24H  . feeding supplement  237 mL Oral BID BM  . furosemide  40 mg Oral Daily  . gentamicin irrigation   Irrigation Once  . metoprolol  25 mg Oral BID  . micafungin [facility]) IV  150 mg Intravenous Daily  . mulitivitamin with minerals  1 tablet Oral Daily  . pantoprazole  40 mg Oral Q1200  . polyethylene glycol  17 g Oral Daily  . DISCONTD: piperacillin-tazobactam (ZOSYN)  IV  3.375 g Intravenous Q8H   Continuous Infusions:  PRN Meds:.acetaminophen, clonazePAM, metoprolol, ondansetron (ZOFRAN) IV, oxyCODONE-acetaminophen, sodium chloride, zolpidem, DISCONTD: LORazepam  Labs:  CMP     Component Value Date/Time   NA 134* 08/20/2011 0605   K 4.0 08/20/2011 0605   CL 98 08/20/2011 0605   CO2 28 08/20/2011 0605   GLUCOSE 103* 08/20/2011 0605   BUN 11 08/20/2011 0605   CREATININE 0.73 08/20/2011 0605   CREATININE 1.10 07/29/2011 1711   CALCIUM 8.8 08/20/2011 0605   PROT 6.7 08/20/2011 0605   ALBUMIN 2.0* 08/20/2011 0605   AST 27 08/20/2011 0605   ALT 17 08/20/2011 0605   ALKPHOS 196* 08/20/2011 0605   BILITOT 0.4 08/20/2011 0605   GFRNONAA >90 08/20/2011 0605   GFRAA >90 08/20/2011 0605     Intake/Output Summary (Last 24 hours) at 08/22/11 0958 Last data filed at 08/22/11 0202  Gross per 24 hour  Intake    480 ml  Output   2075 ml  Net  -1595 ml    CBG (last 3)   Basename 08/20/11 1644  GLUCAP 126*    Weight Status:  93.5 kg (5/23) -- weight up some  Re-estimated needs:  1900-2100 kcals, 105-120 gm protein  Nutrition Dx:  Swallowing  difficulty, improved  Goal:  Oral intake with meals and supplements to meet > 90% of estimated nutrition needs, met Monitor: PO intake, weight, labs, I/O's  Intervention:   Continue Ensure Complete BID  RD to follow for nutrition care plan  Alger Memos Pager #:  [phone]

## 2011-08-22 NOTE — Progress Notes (Signed)
Medical Student Daily Progress Note  Subjective: Patient reports feeling much better than yesterday. He is not aching all over or moaning in discomfort as he once was. He did have a fever overnight that gave him his usual chills and shaking but Tylenol quickly helped him feel better. He is not complaining of any increased effort in breathing this morning and he says he did urinate a lot yesterday which helped pull some fluid off of him and give him relief of the volume overload he was experiencing. He has been on oxygen, but today he will get PT again and try to ambulate without oxygen to see what his oxygen sats do.  Objective: Vital signs in last 24 hours: Filed Vitals:   08/21/11 2214 08/22/11 0110 08/22/11 0205 08/22/11 0639  BP: 144/77   157/78  Pulse: 97   93  Temp:  101.2 F (38.4 C) 101.4 F (38.6 C) 99 F (37.2 C)  TempSrc:  Oral Oral Oral  Resp:    18  Height:      Weight:    93.577 kg (206 lb 4.8 oz)  SpO2:    99%   Weight change: -0.223 kg (-7.9 oz)  Intake/Output Summary (Last 24 hours) at 08/22/11 0930 Last data filed at 08/22/11 0202  Gross per 24 hour  Intake    480 ml  Output   2075 ml  Net  -1595 ml   Physical Exam: BP 157/78  Pulse 93  Temp(Src) 99 F (37.2 C) (Oral)  Resp 18  Ht 5\' 10"  (1.778 m)  Wt 93.577 kg (206 lb 4.8 oz)  BMI 29.60 kg/m2  SpO2 99% General appearance: alert, cooperative and no distress Neck: no adenopathy, no carotid bruit, no JVD, supple, symmetrical, trachea midline and thyroid not enlarged, symmetric, no tenderness/mass/nodules Back: symmetric, no curvature. ROM normal. No CVA tenderness. Lungs: Left: few coarse sounds Right: crackles RLL/RML Chest wall: no tenderness Heart: regular rate and rhythm, S1, S2 normal, no murmur, click, rub or gallop Abdomen: soft, non-tender; bowel sounds normal; no masses,  no organomegaly Extremities: extremities normal, atraumatic, no cyanosis or edema Skin: Skin color, texture, turgor normal.  No rashes or lesions Lab Results: Basic Metabolic Panel:  Lab 08/20/11 1610 08/20/11 0605 08/18/11 0515 08/16/11 0445 08/15/11 1730  NA -- 134* 132* -- --  K -- 4.0 4.1 -- --  CL -- 98 99 -- --  CO2 -- 28 23 -- --  GLUCOSE -- 103* 110* -- --  BUN -- 11 12 -- --  CREATININE -- 0.73 0.75 -- --  CALCIUM -- 8.8 8.5 -- --  MG 1.9 -- -- 1.6 --  PHOS -- -- -- 3.1 5.5*   Liver Function Tests:  Lab 08/20/11 0605 08/16/11 0445  AST 27 26  ALT 17 12  ALKPHOS 196* 40  BILITOT 0.4 0.5  PROT 6.7 6.0  ALBUMIN 2.0* 2.1*   No results found for this basename: LIPASE:2,AMYLASE:2 in the last 168 hours No results found for this basename: AMMONIA:2 in the last 168 hours CBC:  Lab 08/21/11 0540 08/20/11 0605  WBC 6.8 9.8  NEUTROABS 5.0 7.8*  HGB 8.2* 8.4*  HCT 26.2* 26.5*  MCV 82.9 84.1  PLT 220 201   Cardiac Enzymes: No results found for this basename: CKTOTAL:3,CKMB:3,CKMBINDEX:3,TROPONINI:3 in the last 168 hours BNP:  Lab 08/17/11 0415  PROBNP 4005.0*   D-Dimer: No results found for this basename: DDIMER:2 in the last 168 hours CBG:  Lab 08/20/11 1644 08/15/11 1819  GLUCAP  126* 113*   Hemoglobin A1C: No results found for this basename: HGBA1C in the last 168 hours Fasting Lipid Panel: No results found for this basename: CHOL,HDL,LDLCALC,TRIG,CHOLHDL,LDLDIRECT in the last 578 hours Thyroid Function Tests: No results found for this basename: TSH,T4TOTAL,FREET4,T3FREE,THYROIDAB in the last 168 hours Coagulation: No results found for this basename: LABPROT:4,INR:4 in the last 168 hours Anemia Panel: No results found for this basename: VITAMINB12,FOLATE,FERRITIN,TIBC,IRON,RETICCTPCT in the last 168 hours Urine Drug Screen: Drugs of Abuse  No results found for this basename: labopia, cocainscrnur, labbenz, amphetmu, thcu, labbarb    Alcohol Level: No results found for this basename: ETH:2 in the last 168 hours Urinalysis: No results found for this basename:  COLORURINE:2,APPERANCEUR:2,LABSPEC:2,PHURINE:2,GLUCOSEU:2,HGBUR:2,BILIRUBINUR:2,KETONESUR:2,PROTEINUR:2,UROBILINOGEN:2,NITRITE:2,LEUKOCYTESUR:2 in the last 168 hours   Micro Results: Recent Results (from the past 240 hour(s))  FUNGUS CULTURE, BLOOD     Status: Normal   Collection Time   08/12/11  3:18 PM      Component Value Range Status Comment   Specimen Description BLOOD LEFT ARM   Final    Special Requests BOTTLES DRAWN AEROBIC AND ANAEROBIC 10CC   Final    Culture NO GROWTH 7 DAYS   Final    Report Status 08/21/2011 FINAL   Final   CULTURE, BLOOD (ROUTINE X 2)     Status: Normal   Collection Time   08/12/11  6:00 PM      Component Value Range Status Comment   Specimen Description BLOOD ARM RIGHT   Final    Special Requests BOTTLES DRAWN AEROBIC ONLY [facility]   Final    Culture  Setup Time [id number]   Final    Culture NO GROWTH 5 DAYS   Final    Report Status 08/19/2011 FINAL   Final   FUNGUS CULTURE, BLOOD     Status: Normal   Collection Time   08/12/11  6:00 PM      Component Value Range Status Comment   Specimen Description BLOOD RIGHT ARM   Final    Special Requests BOTTLES DRAWN AEROBIC ONLY 6CC   Final    Culture NO GROWTH 7 DAYS   Final    Report Status 08/21/2011 FINAL   Final   CULTURE, BLOOD (ROUTINE X 2)     Status: Normal   Collection Time   08/12/11  6:15 PM      Component Value Range Status Comment   Specimen Description BLOOD ARM LEFT   Final    Special Requests BOTTLES DRAWN AEROBIC AND ANAEROBIC 10CC   Final    Culture  Setup Time [id number]   Final    Culture NO GROWTH 5 DAYS   Final    Report Status 08/19/2011 FINAL   Final   CULTURE, BLOOD (ROUTINE X 2)     Status: Normal   Collection Time   08/13/11 12:40 AM      Component Value Range Status Comment   Specimen Description BLOOD RIGHT ARM   Final    Special Requests BOTTLES DRAWN AEROBIC AND ANAEROBIC 5CC   Final    Culture  Setup Time [id number]   Final    Culture     Final    Value: STAPHYLOCOCCUS  SPECIES (COAGULASE NEGATIVE)     Note: THE SIGNIFICANCE OF ISOLATING THIS ORGANISM FROM A SINGLE SET OF BLOOD CULTURES WHEN MULTIPLE SETS ARE DRAWN IS UNCERTAIN. PLEASE NOTIFY THE MICROBIOLOGY DEPARTMENT WITHIN ONE WEEK IF SPECIATION AND SENSITIVITIES ARE REQUIRED.     Note: Gram Stain Report Called to,Read Back By and  Verified With: Lovie Macadamia 08/14/2011 7:51AM YIMSU   Report Status 08/15/2011 FINAL   Final   CULTURE, BLOOD (ROUTINE X 2)     Status: Normal   Collection Time   08/13/11 12:43 AM      Component Value Range Status Comment   Specimen Description BLOOD RIGHT HAND   Final    Special Requests BOTTLES DRAWN AEROBIC AND ANAEROBIC [facility]   Final    Culture  Setup Time [id number]   Final    Culture     Final    Value: PROPIONIBACTERIUM SPECIES     Note: Gram Stain Report Called to,Read Back By and Verified With: Sallee Lange RN on 08/17/11 at 21:42 by Christie Nottingham PREVIOUSLY REPORTED AS NO GROWTH 5 DAYS CORRECTED RESULTS CALLED TO: SAMANTHA POWELL ON 3700 AT [id number] [place] C   Report Status 08/20/2011 FINAL   Final   AFB CULTURE, BLOOD     Status: Normal (Preliminary result)   Collection Time   08/13/11 12:45 AM      Component Value Range Status Comment   Specimen Description BLOOD RIGHT HAND   Final    Special Requests 5CC   Final    Culture     Final    Value: CULTURE WILL BE EXAMINED FOR 6 WEEKS BEFORE ISSUING A FINAL REPORT   Report Status PENDING   Incomplete   WOUND CULTURE     Status: Normal   Collection Time   08/15/11  4:08 PM      Component Value Range Status Comment   Specimen Description WOUND   Final    Special Requests INFECTED PACING LEAD   Final    Gram Stain     Final    Value: NO WBC SEEN     NO SQUAMOUS EPITHELIAL CELLS SEEN     NO ORGANISMS SEEN   Culture FEW CANDIDA ALBICANS   Final    Report Status 08/18/2011 FINAL   Final   FUNGUS CULTURE W SMEAR     Status: Normal (Preliminary result)   Collection Time   08/15/11  4:08 PM      Component Value  Range Status Comment   Specimen Description WOUND   Final    Special Requests INFECTED PACING LEAD   Final    Fungal Smear FEW HYPHAE   Final    Culture CANDIDA ALBICANS   Final    Report Status PENDING   Incomplete   WOUND CULTURE     Status: Normal   Collection Time   08/15/11  4:10 PM      Component Value Range Status Comment   Specimen Description WOUND   Final    Special Requests ICD DEVICE POCKET SWAB REC'D   Final    Gram Stain     Final    Value: NO WBC SEEN     NO SQUAMOUS EPITHELIAL CELLS SEEN     NO ORGANISMS SEEN   Culture NO GROWTH 2 DAYS   Final    Report Status 08/18/2011 FINAL   Final   FUNGUS CULTURE W SMEAR     Status: Normal (Preliminary result)   Collection Time   08/15/11  4:10 PM      Component Value Range Status Comment   Specimen Description WOUND   Final    Special Requests ICD DEVICE POCKET SWAB REC'D   Final    Fungal Smear NO YEAST OR FUNGAL ELEMENTS SEEN   Final    Culture CULTURE IN PROGRESS FOR FOUR  WEEKS   Final    Report Status PENDING   Incomplete   CULTURE, BLOOD (ROUTINE X 2)     Status: Normal   Collection Time   08/16/11  4:05 AM      Component Value Range Status Comment   Specimen Description BLOOD RIGHT WRIST   Final    Special Requests BOTTLES DRAWN AEROBIC ONLY 1CC   Final    Culture  Setup Time [id number]   Final    Culture NO GROWTH 5 DAYS   Final    Report Status 08/22/2011 FINAL   Final   CULTURE, BLOOD (ROUTINE X 2)     Status: Normal   Collection Time   08/16/11  4:15 AM      Component Value Range Status Comment   Specimen Description BLOOD RIGHT HAND   Final    Special Requests BOTTLES DRAWN AEROBIC ONLY [facility]   Final    Culture  Setup Time [id number]   Final    Culture NO GROWTH 5 DAYS   Final    Report Status 08/22/2011 FINAL   Final   CLOSTRIDIUM DIFFICILE BY PCR     Status: Normal   Collection Time   08/20/11 12:48 PM      Component Value Range Status Comment   C difficile by pcr NEGATIVE  NEGATIVE  Final     Studies/Results: Dg Chest 2 View  08/20/2011  *RADIOLOGY REPORT*  Clinical Data: Fever and cough.  Shortness of breath.  CHEST - 2 VIEW  Comparison: Chest x-ray 08/18/2011.  Findings: Multifocal interstitial and airspace opacities are again seen scattered throughout the lungs bilaterally, most pronounced throughout the right mid and lower lungs, as well as the left lower lobe.  There is blunting of the right costophrenic sulcus, compatible with a small right-sided pleural effusion.  No definite left-sided pleural effusion is noted.  Mild pulmonary venous congestion.  Heart size is mildly enlarged (unchanged). The patient is rotated to the left on today's exam, resulting in distortion of the mediastinal contours and reduced diagnostic sensitivity and specificity for mediastinal pathology.  Atherosclerotic calcifications are noted within the arch of the aorta. New right upper extremity PICC with tip terminating in the right atrium. Status post median sternotomy for CABG with LIMA.  IMPRESSION: 1. New right upper extremity PICC with tip terminating in the right atrium. 2.  Allowing for slight differences in patient positioning and depth of inspiration, the radiographic appearance of chest is otherwise essentially unchanged, as above.  Original Report Authenticated By: Florencia Reasons, M.D.   Medications:  I have reviewed the patient's current medications. Anti-infectives     Start     Dose/Rate Route Frequency Ordered Stop   08/15/11 2200   piperacillin-tazobactam (ZOSYN) IVPB 3.375 g  Status:  Discontinued        3.375 g 12.5 mL/hr over 240 Minutes Intravenous 3 times per day 08/15/11 1758 08/21/11 1039   08/15/11 1830   vancomycin (VANCOCIN) 1,250 mg in sodium chloride 0.9 % 250 mL IVPB  Status:  Discontinued        1,250 mg 166.7 mL/hr over 90 Minutes Intravenous Every 12 hours 08/15/11 1758 08/18/11 1452   08/15/11 1535   gentamycin 80 mg in 0.9% normal saline 250 mL irrigation  Status:   Discontinued          As needed 08/15/11 1535 08/15/11 1608   08/15/11 1515   gentamycin 80 mg in 0.9% normal saline 250 mL irrigation  Status:  Discontinued        1 application Irrigation  Once 08/15/11 1507 08/15/11 1509   08/15/11 1515   gentamicin (GARAMYCIN) 80 mg in sodium chloride irrigation 0.9 % 500 mL irrigation         Irrigation Once 08/15/11 1510     08/14/11 1500   gentamicin (GARAMYCIN) 80 mg in sodium chloride irrigation 0.9 % 500 mL irrigation  Status:  Discontinued        80 mg Irrigation On call 08/14/11 1412 08/15/11 1819   08/14/11 1500   ceFAZolin (ANCEF) IVPB 2 g/50 mL premix        2 g 100 mL/hr over 30 Minutes Intravenous On call 08/14/11 1412 08/15/11 1430   08/13/11 1200   micafungin (MYCAMINE) 150 mg in sodium chloride 0.9 % 100 mL IVPB     Comments: Wait until fungal blood cultures are drawn before starting      150 mg 100 mL/hr over 1 Hours Intravenous Daily 08/13/11 0948     08/13/11 1000   fluconazole (DIFLUCAN) tablet 100 mg  Status:  Discontinued        100 mg Oral Daily 08/12/11 1125 08/13/11 0948   08/12/11 1200   fluconazole (DIFLUCAN) tablet 200 mg        200 mg Oral Daily 08/12/11 1125 08/12/11 1426         Scheduled Meds:   . antiseptic oral rinse  15 mL Mouth Rinse QID  . aspirin EC  81 mg Oral Daily  . chlorhexidine  15 mL Mouth/Throat BID  . enoxaparin (LOVENOX) injection  40 mg Subcutaneous Q24H  . feeding supplement  237 mL Oral BID BM  . furosemide  40 mg Oral Daily  . gentamicin irrigation   Irrigation Once  . metoprolol  25 mg Oral BID  . micafungin [facility]) IV  150 mg Intravenous Daily  . mulitivitamin with minerals  1 tablet Oral Daily  . pantoprazole  40 mg Oral Q1200  . polyethylene glycol  17 g Oral Daily  . 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 Patient had a fever overnight (1st fever in 4 days). Possibilites for this could include: resistance to micafungin, seeding of Candida to his PICC line, discontinuation of zosyn, or idiopathic.  - Appreciate ID, GI, cardiology, opthomology assistance in the care and management of this patient  - continue micafungin (5/14>>) x 6 weeks, ID f/u after d/c and Candida sensitivities are pending in the meantime - PICC was successfully inserted 08/19/11  - vanc (5/16-5/19)  - zosyn (5/16 - 5/22)  - PT recommends 24 hr supervision, HH PT/RN, 3-in-1 bedside commode, rolling walker with 5" wheels  Pulmonary infiltrate - Patient was having a difficult time breathing yesterday and was given 1 dose of Lasix 40mg  with very good response (diuresed 1.3 L). His tachypnea and dyspnea was overall related to his R effusion as well as his low EF (25%) 2/2 the fact that he no longer has his biventricular pacemaker anymore. CT angio chest (5/19) reveals massive right PE in RLL, moderate R pleural effusion, possible right interstitial infiltrate 2/2 infarction, and small patchy infiltrate in superior segment of LLL (infarction 2/2 embolus). Findings are related to embolization of ICD Candidal mass during removal.  - patient's breathing is much better today, and his lungs sound  better than yesterday - will check with cardiology, but he would most likely benefit from a small continuous dose of lasix until reinsertion of his bivent. Pacer  Monoclonal gammopathy - His repeat IgG/A/M revealed a normal range IgG=1420 and still an increased IgA and IgM however overall the values were less than previous values. IgA (537>>385), IgM (723>>423). This further could explain the reason that the values were high simply due to the normal physiological response to his infection. Outpatient follow-up is recommended after he resolves from this  large acute episode with retesting of his SPEP and QIG with IF.  - Appreciate hematology consult in the management of this patient  - he will be retested as an outpatient after this episode resolves and if SPEP still abnormal then will refer to hematology at that time  GERD/Esophageal stricture - He is s/p esophageal dilation on 08/12/11. He has had a history of esophageal dilation s/p strictures and candidiasis and poor po intake.  - Regular diet, well tolerated  - continue protonix  Ischemic cardiomyopathy - Stable. He received Lasix 40 x 1 yesterday for some dyspnea and increasing fluid in this legs/feet. 2D echo 08/15/11 reveals LVEF = 25% and diffuse hypokinesis. Mild-moderate MVR, mildly dilated LA, mild TVR, and paradoxical septal motion.  - Appreciate cardiology recommendations.  - continue metoprolol BID  - continue ASA   Normocytic anemia - Hgb =8.9, MCV =83. This is acute and has developed since March when hemoglobin was 14. Anemia panel revealing: Iron = 21, Sat ratio = 9, TIBC =243, Ferritin = 597, Retic count =1.9, FOBT negative. Smear revealed mild anemia with normal morphology. Still consistent with AOCD given that TIBC is normal (elevated in IDA) and ferritin high although could still be a component of acute phase reactant. Pt had last colonoscopy in 2012 showing sigmoid and descending diverticula (but no recurrent polyps - last noted to have hyperplastic polyps in 2001).   Thrombocytopenia - PLTC = 257. Likely related to underlying gammopathy. Smear also reveals thrombocytopenia. His PLTC has begun trending upwards as well over the last couple of days.  - began lovenox for DVT on 5/21 2/2 normal PLTC - resolved  Weight loss, unintentional - improving with fluids and food. Weight loss was likely secondary to decreased by mouth intake and underlying infection. Now eating well.  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 Multiple causes for this include extension of Candida infection vs. Vitamin B2 deficiency (less likely) vs. Iron deficiency anemia (AOCD more likely at this point), all of which are treatable conditions in this patient. As final diagnosis nears closer, treatment options for this will narrow as well.  - likely will resolve as his esophageal candida resolves completely   Constipation - He has had spells of decreased po intake during his spells of dysphagia. He has been on a regular diet the last several days with no problems of constipation anymore.   - resolved  - continue miralax as needed  - C. Diff NEGATIVE 2/2 two episodes of loose, watery stools  CAD h/o CABG x 4 (2003) - Stable. CE negative x 3 on admission.  - continue metoprolol  - continue ASA  - no statin, ACEi, or ARB 2/2  allergy   DVT ppx - Lovenox  Disposition - Due to his fever over night he will remain in the hospital until 24 hours fever-free as well as having PT to get him out of bed again today and testing his oxygen saturation level as he ambulates without his oxygen to assess need for home O2. He might be able to be discharged home tomorrow if no fevers and cleared by cardio, ID, IMTS.   LOS: 14 days   This is a Psychologist, occupational Note.  The care of the patient was discussed with Dr. Quentin Ore and the assessment and plan formulated with their assistance.  Please see their attached note for official documentation of the daily encounter.  Lewie Chamber 08/22/2011, 9:30 AM

## 2011-08-22 NOTE — Progress Notes (Signed)
Physical Therapy Treatment Patient Details Name: [name] MRN: [id number] DOB: 06/25/1945 Today's Date: 08/22/2011 Time: 8295-6213 PT Time Calculation (min): 19 min  PT Assessment / Plan / Recommendation Comments on Treatment Session  Patient presents with improving functional activity tolerance, but SpO2 may be indicative of need for home O2 with gait.     Follow Up Recommendations  Supervision/Assistance - 24 hour;Home health PT    Barriers to Discharge  None      Equipment Recommendations   (has all needs in room)    Recommendations for Other Services  none  Frequency Min 3X/week   Plan Discharge plan remains appropriate;Frequency remains appropriate    Precautions / Restrictions Precautions Precautions: Fall   Pertinent Vitals/Pain See note below. No pain    Mobility  Bed Mobility Supine to Sit: 6: Modified independent (Device/Increase time) Sitting - Scoot to Edge of Bed: 6: Modified independent (Device/Increase time) Sit to Supine: 6: Modified independent (Device/Increase time) Transfers Sit to Stand: 5: Supervision Stand to Sit: 5: Supervision Details for Transfer Assistance: Questioning cues for correct hand placement. Good execution. Ambulation/Gait Ambulation/Gait Assistance: 4: Min guard Ambulation Distance (Feet): 180 Feet Assistive device: Rolling walker Ambulation/Gait Assistance Details: Verbal cues for posture to stay safe distance from walker. Ambulated on room air with drop in SpO2 to 87-89 briefly and incr. to 90-92 with rest. Gait Pattern: Step-through pattern;Decreased stride length     PT Goals Acute Rehab PT Goals PT Goal: Sit to Stand - Progress: Met PT Goal: Stand to Sit - Progress: Met PT Goal: Stand - Progress: Progressing toward goal PT Goal: Ambulate - Progress: Progressing toward goal  Visit Information  Last PT Received On: 08/22/11 Assistance Needed: +1    Subjective Data  Subjective: Patient reports ambulating with  nursing to station earlier today Patient Stated Goal: Home in 15 minutes!   Cognition  Overall Cognitive Status: Appears within functional limits for tasks assessed/performed Arousal/Alertness: Awake/alert Orientation Level: Appears intact for tasks assessed Behavior During Session: [facility] for tasks performed    Balance   No evidence of imbalance  End of Session PT - End of Session Equipment Utilized During Treatment: Gait belt Activity Tolerance: Patient tolerated treatment well Patient left: in bed;with call bell/phone within reach;with family/visitor present Nurse Communication: Mobility status    Edwyna Perfect, PT  Pager [phone]  08/22/2011, 3:40 PM

## 2011-08-22 NOTE — Progress Notes (Signed)
Patient ID: [name], male   DOB: 1945-04-20, 66 y.o.   MRN: [id number]   PROGRESS NOTE  Subjective:   Pt feels better.  Still dyspneic  Still some subjective chills  Objective:    Vital Signs:   Temp:  [98.2 F (36.8 C)-101.4 F (38.6 C)] 99 F (37.2 C) (05/23 1000) Pulse Rate:  [90-115] 115  (05/23 1000) Resp:  [18-20] 18  (05/23 0639) BP: (144-157)/(66-78) 157/78 mmHg (05/23 0639) SpO2:  [90 %-100 %] 90 % (05/23 1305) Weight:  [206 lb 4.8 oz (93.577 kg)] 206 lb 4.8 oz (93.577 kg) (05/23 0639)  Last BM Date: 08/22/11   24-hour weight change: Weight change: -7.9 oz (-0.223 kg)  Weight trends: Filed Weights   08/19/11 0500 08/21/11 0500 08/22/11 0639  Weight: 205 lb 0.4 oz (93 kg) 206 lb 12.7 oz (93.8 kg) 206 lb 4.8 oz (93.577 kg)    Intake/Output:  05/22 0701 - 05/23 0700 In: 720 [P.O.:720] Out: 2075 [Urine:2075]     Physical Exam: BP 157/78  Pulse 115  Temp(Src) 99 F (37.2 C) (Oral)  Resp 18  Ht 5\' 10"  (1.778 m)  Wt 206 lb 4.8 oz (93.577 kg)  BMI 29.60 kg/m2  SpO2 90%  General: Vital signs reviewed and noted. Well-developed, well-nourished, in no acute distress; alert, appropriate and cooperative throughout examination.  Head: Normocephalic, atraumatic.  Eyes: conjunctivae/corneas clear. PERRL, EOM's intact.   Throat: normal  Neck: Supple. Normal carotids. No JVD  Lungs:  Clear anteriorly  Heart: Regular rate,  With normal  S1 S2. No murmurs, gallops or rubs  Abdomen:  Soft, non-tender, non-distended with normoactive bowel sounds. No hepatomegaly. No rebound/guarding. No abdominal masses.  Extremities: Distal pedal pulses are 2+ .  No edema.    Neurologic: A&O X3, CN II - XII are grossly intact. Motor strength is 5/5 in the all 4 extremities. sleepy  Psych: Responds to questions appropriately with normal affect.    Labs: BMET:  Basename 08/22/11 1005 08/20/11 1055 08/20/11 0605  NA 133* -- 134*  K 3.6 -- 4.0  CL 96 -- 98  CO2 27 -- 28    GLUCOSE 143* -- 103*  BUN 10 -- 11  CREATININE 0.65 -- 0.73  CALCIUM 8.8 -- 8.8  MG -- 1.9 --  PHOS -- -- --    Liver function tests:  Basename 08/22/11 1005 08/20/11 0605  AST 21 27  ALT 13 17  ALKPHOS 80 196*  BILITOT 0.3 0.4  PROT 6.6 6.7  ALBUMIN 1.9* 2.0*   No results found for this basename: LIPASE:2,AMYLASE:2 in the last 72 hours  CBC:  Basename 08/22/11 0545 08/21/11 0540 08/20/11 0605  WBC 4.9 6.8 --  NEUTROABS -- 5.0 7.8*  HGB 8.9* 8.2* --  HCT 27.3* 26.2* --  MCV 83.0 82.9 --  PLT 257 220 --     Tele:  NSR   Medications:    Infusions:    Scheduled Medications:    . antiseptic oral rinse  15 mL Mouth Rinse QID  . aspirin EC  81 mg Oral Daily  . chlorhexidine  15 mL Mouth/Throat BID  . enoxaparin (LOVENOX) injection  40 mg Subcutaneous Q24H  . feeding supplement  237 mL Oral BID BM  . furosemide  40 mg Oral Daily  . gentamicin irrigation   Irrigation Once  . metoprolol  25 mg Oral BID  . micafungin [facility]) IV  150 mg Intravenous Daily  . mulitivitamin with minerals  1 tablet Oral  Daily  . pantoprazole  40 mg Oral Q1200  . polyethylene glycol  17 g Oral Daily    Assessment/ Plan:    1. Chronic systolic CHF:  Continue current meds.  Might consider IV lasix.  2. ?PE:  This is likely the fungal vegetation but I think it would be prudent to do a venous duplex of his legs to R/O DVT.  He has SCDs on at present. Wold check an ABG  3. Fungal infection:  Plans per Int. Med.  Length of Stay: 14  Lewayne Bunting, MD, [facility] 08/22/2011, 1:09 PM Office [phone] Pager [phone]

## 2011-08-23 DIAGNOSIS — I059 Rheumatic mitral valve disease, unspecified: Secondary | ICD-10-CM

## 2011-08-23 LAB — COMPLEMENT, TOTAL: Compl, Total (CH50): 43 U/mL (ref 31–60)

## 2011-08-23 MED ORDER — METOPROLOL TARTRATE 50 MG PO TABS: 25.0000 mg | ORAL_TABLET | Freq: Two times a day (BID) | ORAL | Status: DC

## 2011-08-23 MED ORDER — ENSURE COMPLETE PO LIQD: 237.0000 mL | Freq: Two times a day (BID) | ORAL | Status: DC

## 2011-08-23 MED ORDER — FUROSEMIDE 40 MG PO TABS: 40.0000 mg | ORAL_TABLET | Freq: Every day | ORAL | Status: DC

## 2011-08-23 MED ORDER — SODIUM CHLORIDE 0.9 % IV SOLN: 150.0000 mg | Freq: Every day | INTRAVENOUS | Status: DC

## 2011-08-23 MED ORDER — ASPIRIN 81 MG PO TBEC: 81.0000 mg | DELAYED_RELEASE_TABLET | Freq: Every day | ORAL | Status: DC

## 2011-08-23 MED ORDER — CLONAZEPAM 0.5 MG PO TABS: 0.5000 mg | ORAL_TABLET | Freq: Two times a day (BID) | ORAL | Status: DC | PRN

## 2011-08-23 MED ORDER — OXYCODONE-ACETAMINOPHEN 5-325 MG PO TABS: 1.0000 | ORAL_TABLET | Freq: Four times a day (QID) | ORAL | Status: AC | PRN

## 2011-08-23 MED ORDER — HEPARIN SOD (PORK) LOCK FLUSH 100 UNIT/ML IV SOLN
250.0000 [IU] | Freq: Every day | INTRAVENOUS | Status: DC
  Administered 2011-08-23: 250 [IU]

## 2011-08-23 MED ORDER — POLYETHYLENE GLYCOL 3350 17 G PO PACK: 17.0000 g | PACK | Freq: Every day | ORAL | Status: AC | PRN

## 2011-08-23 MED ORDER — ACETAMINOPHEN 325 MG PO TABS: 325.0000 mg | ORAL_TABLET | ORAL | Status: DC | PRN

## 2011-08-23 MED ORDER — HEPARIN SOD (PORK) LOCK FLUSH 100 UNIT/ML IV SOLN
250.0000 [IU] | INTRAVENOUS | Status: DC | PRN
  Filled 2011-08-23: qty 3

## 2011-08-23 MED ORDER — FLAXSEED (LINSEED) 1000 MG PO CAPS: 1.0000 | ORAL_CAPSULE | Freq: Every day | ORAL | Status: DC

## 2011-08-23 NOTE — Discharge Instructions (Signed)
The camera used to look down your throat revealed the presence of Candida for which you were started on intravenous (IV) micafungin. You also had another dilation of your esophagus. 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 Surgery was scheduled to remove the pacemaker and after this the mass traveled to your lung which caused your drop in blood pressure (hypotension) and fast heart rate (tachycardia). You were transferred to ICU and were intubated for a night as you recovered. Upon your recovery, you were extubated and transferred out of the ICU. It was determined that the proper course of your antifungal medicine (micafungin) will slowly treat this infection and you will continue to improve with time.  If your fevers get worse or do not improve with Tylenol or you notice any increased effort of breathing or swelling in your body please call the clinic immediately. [phone]   Candida Infection, Adult A candida infection (also called yeast, fungus and Monilia infection) is an overgrowth of yeast that can occur anywhere on the body. A yeast infection commonly occurs in warm, moist body areas. Usually, the infection remains localized but can spread to become a systemic infection. A yeast infection may be a sign of a more severe disease such as diabetes, leukemia, or AIDS. A yeast infection can occur in both men and women. In women, Candida vaginitis is a vaginal infection. It is one of the most common causes of vaginitis. Men usually do not have symptoms or know they have an infection until other problems develop. Men may find out they have a yeast infection because their sex partner has a yeast infection. Uncircumcised men are more likely to get a yeast infection than circumcised men. This is because the uncircumcised glans is not exposed to air and does not  remain as dry as that of a circumcised glans. Older adults may develop yeast infections around dentures. CAUSES  Women  Antibiotics.   Steroid medication taken for a long time.   Being overweight (obese).   Diabetes.   Poor immune condition.   Certain serious medical conditions.   Immune suppressive medications for organ transplant patients.   Chemotherapy.   Pregnancy.   Menstration.   Stress and fatigue.   Intravenous drug use.   Oral contraceptives.   Wearing tight-fitting clothes in the crotch area.   Catching it from a sex partner who has a yeast infection.   Spermicide.   Intravenous, urinary, or other catheters.  Men  Catching it from a sex partner who has a yeast infection.   Having oral or anal sex with a person who has the infection.   Spermicide.   Diabetes.   Antibiotics.   Poor immune system.   Medications that suppress the immune system.   Intravenous drug use.   Intravenous, urinary, or other catheters.  SYMPTOMS  Women  Thick, white vaginal discharge.   Vaginal itching.   Redness and swelling in and around the vagina.   Irritation of the lips of the vagina and perineum.   Blisters on the vaginal lips and perineum.   Painful sexual intercourse.   Low blood sugar (hypoglycemia).   Painful urination.   Bladder infections.   Intestinal problems such as constipation, indigestion, bad breath, bloating, increase in gas, diarrhea, or loose stools.  Men  Men may develop intestinal problems such as constipation, indigestion, bad breath,  bloating, increase in gas, diarrhea, or loose stools.   Dry, cracked skin on the penis with itching or discomfort.   Jock itch.   Dry, flaky skin.   Athlete's foot.   Hypoglycemia.  DIAGNOSIS  Women  A history and an exam are performed.   The discharge may be examined under a microscope.   A culture may be taken of the discharge.  Men  A history and an exam are performed.    Any discharge from the penis or areas of cracked skin will be looked at under the microscope and cultured.   Stool samples may be cultured.  TREATMENT  Women  Vaginal antifungal suppositories and creams.   Medicated creams to decrease irritation and itching on the outside of the vagina.   Warm compresses to the perineal area to decrease swelling and discomfort.   Oral antifungal medications.   Medicated vaginal suppositories or cream for repeated or recurrent infections.   Wash and dry the irritation areas before applying the cream.   Eating yogurt with lactobacillus may help with prevention and treatment.   Sometimes painting the vagina with gentian violet solution may help if creams and suppositories do not work.  Men  Antifungal creams and oral antifungal medications.   Sometimes treatment must continue for 30 days after the symptoms go away to prevent recurrence.  HOME CARE INSTRUCTIONS  Women  Use cotton underwear and avoid tight-fitting clothing.   Avoid colored, scented toilet paper and deodorant tampons or pads.   Do not douche.   Keep your diabetes under control.   Finish all the prescribed medications.   Keep your skin clean and dry.   Consume milk or yogurt with lactobacillus active culture regularly. If you get frequent yeast infections and think that is what the infection is, there are over-the-counter medications that you can get. If the infection does not show healing in 3 days, talk to your caregiver.   Tell your sex partner you have a yeast infection. Your partner may need treatment also, especially if your infection does not clear up or recurs.  Men  Keep your skin clean and dry.   Keep your diabetes under control.   Finish all prescribed medications.   Tell your sex partner that you have a yeast infection so they can be treated if necessary.  SEEK MEDICAL CARE IF:   Your symptoms do not clear up or worsen in one week after treatment.    You have an oral temperature above 102 F (38.9 C).   You have trouble swallowing or eating for a prolonged time.   You develop blisters on and around your vagina.   You develop vaginal bleeding and it is not your menstrual period.   You develop abdominal pain.   You develop intestinal problems as mentioned above.   You get weak or lightheaded.   You have painful or increased urination.   You have pain during sexual intercourse.  MAKE SURE YOU:   Understand these instructions.   Will watch your condition.   Will get help right away if you are not doing well or get worse.  Document Released: 04/25/2004 Document Revised: 03/07/2011 Document Reviewed: 08/07/2009 [facility] Patient Information 2012 [place], [place].  Peripherally Inserted Central Catheter (PICC) Home Guide A peripherally inserted central catheter (PICC) is a long, thin, flexible tube that is inserted into a vein in the upper arm. It is a form of intravenous (IV) access. It is considered to be a "central" line because the  tip of the PICC ends in a large vein in your chest. This large vein is called the superior vena cava (SVC). The PICC tip ends in the SVC because there is a lot of blood flow in the SVC. This allows medicines and IV fluids to be quickly distributed throughout the body. The PICC is inserted using a sterile technique by a specially trained nurse or physician. After the PICC is inserted, a chest X-ray is done to be sure it is in the correct place.  A PICC may be placed for different reasons, such as:  To give medicines and liquid nutrition that can only be given through a central line. Examples are:   Certain antibiotic treatments.   Chemotherapy.   Total parenteral nutrition (TPN).   To take frequent blood samples.   To give IV fluids and blood products.   If there is difficulty placing a peripheral intravenous (PIV) catheter.  If taken care of properly, a PICC can remain in place for several  months. A PICC can also allow patients to go home early. Medicine and PICC care can be managed at home by a family member or home healthcare team. RISKS AND COMPLICATIONS Possible problems with a PICC can occasionally occur. This may include:  A clot (thrombus) forming in or at the tip of the PICC. This can cause the PICC to become clogged. A "clot-busting" medicine called tissue plasminogen activator (tPA) can be inserted into the PICC to help break up the clot.   Inflammation of the vein (phlebitis) in which the PICC is placed. Signs of inflammation may include redness, pain at the insertion site, red streaks, or being able to feel a "cord" in the vein where the PICC is located.   Infection in the PICC or at the insertion site. Signs of infection may include fever, chills, redness, swelling, or pus drainage from the PICC insertion site.   PICC movement (malposition). The PICC tip may migrate from its original position due to excessive physical activity, forceful coughing, sneezing, or vomiting.   A break or cut in the PICC. It is important to not use scissors near the PICC.   Nerve or tendon irritation or injury during PICC insertion.  HOME CARE INSTRUCTIONS Activity  You may bend your arm and move it freely. If your PICC is near or at the bend of your elbow, avoid activity with repeated motion at the elbow.   Avoid lifting heavy objects as instructed by your caregiver.   Avoid using a crutch with the arm on the same side as your PICC. You may need to use a walker.  PICC Dressing  Keep your PICC bandage (dressing) clean and dry to prevent infection.   Ask your caregiver when you may shower. Ask your caregiver to teach you how to wrap the PICC when you do take a shower.   Do not bathe, swim, or use hot tubs when you have a PICC.   Change the PICC dressing as instructed by your caregiver.   Change your PICC dressing if it becomes loose or wet.  General PICC Care  Check the PICC  insertion site daily for leakage, redness, swelling, or pain.   Flush the PICC as directed by your caregiver. Let your caregiver know right away if the PICC is difficult to flush or does not flush. Do not use force to flush the PICC.   Do not use a syringe that is less than 10 mLs to flush the PICC.   Never  pull or tug on the PICC.   Avoid blood pressure checks on the arm with the PICC.   Keep your PICC identification card with you at all times.   Do not take the PICC out yourself. Only a trained clinical professional should remove the PICC.  SEEK IMMEDIATE MEDICAL CARE IF:  Your PICC is accidently pulled all the way out. If this happens, cover the insertion site with a bandage or gauze dressing. Do not throw the PICC away. Your caregiver will need to inspect it.   Your PICC was tugged or pulled and has partially come out. Do not  push the PICC back in.   There is any type of drainage, redness, or swelling where the PICC enters the skin.   You cannot flush the PICC, it is difficult to flush, or the PICC leaks around the insertion site when it is flushed.   You hear a "flushing" sound when the PICC is flushed.   You have pain, discomfort, or numbness in your arm, shoulder, or jaw on the same side as the PICC .   You feel your heart "racing" or skipping beats.   You notice a hole or tear in the PICC.   You develop chills or a fever.  MAKE SURE YOU:   Understand these instructions.   Will watch your condition.   Will get help right away if you are not doing well or get worse.  Document Released: 09/22/2002 Document Revised: 03/07/2011 Document Reviewed: 07/23/2010 [facility] Patient Information 2012 [place], [place].Infective Endocarditis Infective endocarditis is an infection of the inner layer of heart (endocardium) or the heart valves. An endocarditis infection is caused by many things, including viruses, bacteria or fungi that enter your bloodstream. If left untreated,  endocarditis can cause other problems, which may include:  Heart valve damage.   Blood clots (embolism).   Irregular heartbeat (arrhythmia).   Heart failure.  CAUSES  Surgery, dental work and IV drug abuse can allow bacteria or fungi to enter the bloodstream. Once in the bloodstream, bacteria can attach to the inner lining of the heart or heart valves, causing infection. SYMPTOMS   Fever and chills.   Skin rashes.   Heart murmurs.   Spleen enlargement.   Unexplained weight loss.   Shortness of breath.   Blood in urine.   Lethargy and fatigue.   Sudden weakness in face, arms, legs (possible stroke).   Night sweats.  DIAGNOSIS   Endocarditis may be suspected if your caregiver hears a heart murmur, observes certain skin rashes, or there are changes in your eye exam.   Tests include:   Blood work.   TTE (transthoracic echocardiogram): A TTE uses sound waves to produce images of the heart. A hand-held device is placed on the chest and transmits sound waves. These sound waves bounce off the heart to produce images that help your caregiver detect heart damage.   TEE (transesophageal echocardiogram): For a TEE, a flexible tube is passed down your throat and into the esophagus (the swallowing tube that connects the mouth to the stomach). Because the esophagus is close to the heart, a TEE allows different images of the heart to be obtained. This type of procedure requires sedation.  WHO IS AT RISK FOR INFECTIVE ENDOCARDITIS?  Those with a history of endocarditis.   People with artificial (prosthetic) heart valves.   Cardiac transplant patients with valve disease.   People with congenital heart disease, including:   Those with unrepaired congenital heart defects.  Those with a congenital heart defect repaired with prosthetic materials. Within the first 6 months of the procedure, you are still at risk for infection. After 6 months, talk to your caregiver about your special  needs.   Those who have had surgery to correct the defect but with some remaining problems at the site of repair.  PREVENTION  For people at risk, antibiotics (medicines that kill germs) should be given before a procedure to prevent infection. Some conditions pre-treated with antibiotics are:  Dental procedures, like teeth cleaning or surgery.   Surgery involving the lungs, abdomen, or urinary system.  TREATMENT   Intravenous (IV) antibiotic treatment may be needed for several weeks to treat endocarditis.   Surgery may be needed to remove the infected tissue in the heart.   Surgery to repair valve damage.  MAKE SURE YOU:   Understand these instructions.   Will watch your condition.   Will get help right away if you are not doing well or get worse.  Document Released: 03/18/2005 Document Revised: 03/07/2011 Document Reviewed: 07/30/2006 [employer] Patient Information 2012 [employer], LLFever, Adult A fever is a temperature of 100.4 F (38 C) or above.  HOME CARE  Take fever medicine as told by your doctor. Do not  take aspirin for fever if you are younger than 66 years of age.   If you are given antibiotic medicine, take it as told. Finish the medicine even if you start to feel better.   Rest.   Drink enough fluids to keep your pee (urine) clear or pale yellow. Do not drink alcohol.   Take a bath or shower with room temperature water. Do not use ice water or alcohol sponge baths.   Wear lightweight, loose clothes.  GET HELP RIGHT AWAY IF:   You are short of breath or have trouble breathing.   You are very weak.   You are dizzy or you pass out (faint).   You are very thirsty or are making little or no urine.   You have new pain.   You throw up (vomit) or have watery poop (diarrhea).   You keep throwing up or having watery poop for more than 1 to 2 days.   You have a stiff neck or light bothers your eyes.   You have a skin rash.   You have a fever or problems  (symptoms) that last for more than 2 to 3 days.   You have a fever and your problems quickly get worse.   You keep throwing up the fluids you drink.   You do not feel better after 3 days.   You have new problems.  MAKE SURE YOU:   Understand these instructions.   Will watch your condition.   Will get help right away if you are not doing well or get worse.  Document Released: 12/26/2007 Document Revised: 03/07/2011 Document Reviewed: 01/17/2011 [facility] Patient Information 2012 [place], [place].C.

## 2011-08-23 NOTE — Progress Notes (Signed)
Pt discharged by wheel chair to private vehicle home. 

## 2011-08-23 NOTE — Progress Notes (Signed)
INFECTIOUS DISEASE PROGRESS NOTE  ID: [name] is a 66 y.o. male with   Principal Problem:  *Candidal endocarditis Active Problems:  Chronic systolic congestive heart failure  CONGESTIVE HEART FAILURE, LEFT  ICD - IN SITU  CAD (coronary artery disease)  FUO (fever of unknown origin)  Weight loss, unintentional  Normocytic anemia  Esophageal stricture  Thrombocytopenia  Multiple pulmonary nodules  Sepsis  Rigors  Septic embolism  Acute respiratory failure with hypoxia  Endocarditis, infective, acute/subacute in other disease  Subjective: 1-2 loose BM/day. Only 1 loose BM so far today. No SOB. Was off O2 last pm.   Abtx:  Anti-infectives     Start     Dose/Rate Route Frequency Ordered Stop   08/15/11 2200   piperacillin-tazobactam (ZOSYN) IVPB 3.375 g  Status:  Discontinued        3.375 g 12.5 mL/hr over 240 Minutes Intravenous 3 times per day 08/15/11 1758 08/21/11 1039   08/15/11 1830   vancomycin (VANCOCIN) 1,250 mg in sodium chloride 0.9 % 250 mL IVPB  Status:  Discontinued        1,250 mg 166.7 mL/hr over 90 Minutes Intravenous Every 12 hours 08/15/11 1758 08/18/11 1452   08/15/11 1535   gentamycin 80 mg in 0.9% normal saline 250 mL irrigation  Status:  Discontinued          As needed 08/15/11 1535 08/15/11 1608   08/15/11 1515   gentamycin 80 mg in 0.9% normal saline 250 mL irrigation  Status:  Discontinued        1 application Irrigation  Once 08/15/11 1507 08/15/11 1509   08/15/11 1515   gentamicin (GARAMYCIN) 80 mg in sodium chloride irrigation 0.9 % 500 mL irrigation         Irrigation Once 08/15/11 1510     08/14/11 1500   gentamicin (GARAMYCIN) 80 mg in sodium chloride irrigation 0.9 % 500 mL irrigation  Status:  Discontinued        80 mg Irrigation On call 08/14/11 1412 08/15/11 1819   08/14/11 1500   ceFAZolin (ANCEF) IVPB 2 g/50 mL premix        2 g 100 mL/hr over 30 Minutes Intravenous On call 08/14/11 1412 08/15/11 1430   08/13/11 1200    micafungin (MYCAMINE) 150 mg in sodium chloride 0.9 % 100 mL IVPB     Comments: Wait until fungal blood cultures are drawn before starting      150 mg 100 mL/hr over 1 Hours Intravenous Daily 08/13/11 0948     08/13/11 1000   fluconazole (DIFLUCAN) tablet 100 mg  Status:  Discontinued        100 mg Oral Daily 08/12/11 1125 08/13/11 0948   08/12/11 1200   fluconazole (DIFLUCAN) tablet 200 mg        200 mg Oral Daily 08/12/11 1125 08/12/11 1426          Medications:  Scheduled:   . antiseptic oral rinse  15 mL Mouth Rinse QID  . aspirin EC  81 mg Oral Daily  . chlorhexidine  15 mL Mouth/Throat BID  . enoxaparin (LOVENOX) injection  40 mg Subcutaneous Q24H  . feeding supplement  237 mL Oral BID BM  . furosemide  40 mg Oral Daily  . gentamicin irrigation   Irrigation Once  . metoprolol  25 mg Oral BID  . micafungin [facility]) IV  150 mg Intravenous Daily  . mulitivitamin with minerals  1 tablet Oral Daily  . pantoprazole  40 mg Oral Q1200  . polyethylene glycol  17 g Oral Daily    Objective: Vital signs in last 24 hours: Temp:  [98 F (36.7 C)-101.3 F (38.5 C)] 98.7 F (37.1 C) (05/24 1103) Pulse Rate:  [84-101] 101  (05/24 0500) Resp:  [18] 18  (05/24 0500) BP: (148-182)/(64-102) 179/77 mmHg (05/24 0500) SpO2:  [90 %-98 %] 95 % (05/24 0500) Weight:  [89.54 kg (197 lb 6.4 oz)] 89.54 kg (197 lb 6.4 oz) (05/24 0500)   General appearance: alert, cooperative and no distress Resp: clear to auscultation bilaterally Cardio: regular rate and rhythm GI: normal findings: bowel sounds normal and soft, non-tender RUE PIC clean, non-tender  Lab Results  Basename 08/22/11 1005 08/22/11 0545 08/21/11 0540  WBC -- 4.9 6.8  HGB -- 8.9* 8.2*  HCT -- 27.3* 26.2*  NA 133* -- --  K 3.6 -- --  CL 96 -- --  CO2 27 -- --  BUN 10 -- --  CREATININE 0.65 -- --  GLU -- -- --   Liver Panel  Basename 08/22/11 1005  PROT 6.6  ALBUMIN 1.9*  AST 21  ALT 13  ALKPHOS 80  BILITOT  0.3  BILIDIR --  IBILI --   Sedimentation Rate No results found for this basename: ESRSEDRATE in the last 72 hours C-Reactive Protein No results found for this basename: CRP:2 in the last 72 hours  Microbiology: Recent Results (from the past 240 hour(s))  WOUND CULTURE     Status: Normal   Collection Time   08/15/11  4:08 PM      Component Value Range Status Comment   Specimen Description WOUND   Final    Special Requests INFECTED PACING LEAD   Final    Gram Stain     Final    Value: NO WBC SEEN     NO SQUAMOUS EPITHELIAL CELLS SEEN     NO ORGANISMS SEEN   Culture FEW CANDIDA ALBICANS   Final    Report Status 08/18/2011 FINAL   Final   FUNGUS CULTURE W SMEAR     Status: Normal (Preliminary result)   Collection Time   08/15/11  4:08 PM      Component Value Range Status Comment   Specimen Description WOUND   Final    Special Requests INFECTED PACING LEAD   Final    Fungal Smear FEW HYPHAE   Final    Culture CANDIDA ALBICANS   Final    Report Status PENDING   Incomplete   WOUND CULTURE     Status: Normal   Collection Time   08/15/11  4:10 PM      Component Value Range Status Comment   Specimen Description WOUND   Final    Special Requests ICD DEVICE POCKET SWAB REC'D   Final    Gram Stain     Final    Value: NO WBC SEEN     NO SQUAMOUS EPITHELIAL CELLS SEEN     NO ORGANISMS SEEN   Culture NO GROWTH 2 DAYS   Final    Report Status 08/18/2011 FINAL   Final   FUNGUS CULTURE W SMEAR     Status: Normal (Preliminary result)   Collection Time   08/15/11  4:10 PM      Component Value Range Status Comment   Specimen Description WOUND   Final    Special Requests ICD DEVICE POCKET SWAB REC'D   Final    Fungal Smear NO YEAST OR FUNGAL ELEMENTS SEEN  Final    Culture CULTURE IN PROGRESS FOR FOUR WEEKS   Final    Report Status PENDING   Incomplete   CULTURE, BLOOD (ROUTINE X 2)     Status: Normal   Collection Time   08/16/11  4:05 AM      Component Value Range Status Comment    Specimen Description BLOOD RIGHT WRIST   Final    Special Requests BOTTLES DRAWN AEROBIC ONLY 1CC   Final    Culture  Setup Time [id number]   Final    Culture NO GROWTH 5 DAYS   Final    Report Status 08/22/2011 FINAL   Final   CULTURE, BLOOD (ROUTINE X 2)     Status: Normal   Collection Time   08/16/11  4:15 AM      Component Value Range Status Comment   Specimen Description BLOOD RIGHT HAND   Final    Special Requests BOTTLES DRAWN AEROBIC ONLY [facility]   Final    Culture  Setup Time [id number]   Final    Culture NO GROWTH 5 DAYS   Final    Report Status 08/22/2011 FINAL   Final   CLOSTRIDIUM DIFFICILE BY PCR     Status: Normal   Collection Time   08/20/11 12:48 PM      Component Value Range Status Comment   C difficile by pcr NEGATIVE  NEGATIVE  Final   CULTURE, BLOOD (ROUTINE X 2)     Status: Normal (Preliminary result)   Collection Time   08/22/11  2:20 PM      Component Value Range Status Comment   Specimen Description BLOOD RIGHT HAND   Final    Special Requests     Final    Value: BOTTLES DRAWN AEROBIC AND ANAEROBIC AERO 10CC ANA 8CC   Culture  Setup Time [id number]   Final    Culture     Final    Value:        BLOOD CULTURE RECEIVED NO GROWTH TO DATE CULTURE WILL BE HELD FOR 5 DAYS BEFORE ISSUING A FINAL NEGATIVE REPORT   Report Status PENDING   Incomplete   CULTURE, BLOOD (ROUTINE X 2)     Status: Normal (Preliminary result)   Collection Time   08/22/11  3:15 PM      Component Value Range Status Comment   Specimen Description BLOOD HAND RIGHT   Final    Special Requests BOTTLES DRAWN AEROBIC ONLY 3CC   Final    Culture  Setup Time [id number]   Final    Culture     Final    Value:        BLOOD CULTURE RECEIVED NO GROWTH TO DATE CULTURE WILL BE HELD FOR 5 DAYS BEFORE ISSUING A FINAL NEGATIVE REPORT   Report Status PENDING   Incomplete     Studies/Results: No results found.   Assessment/Plan: Infective Endocarditis, ICD,  Candida seen on lead (08-15-11)  Massive  R pulmonary embolus  MRSA PCR +  Day 12 antifungal therapy (micafungin)    Agree with Dr Josem Kaufmann and Raisch that this is most likely related to his esophageal candidiasis/repeated esophageal dilatations.  I am not sure why he has developed candidiasis in a seemingly normal immune host- CD4 is nl and Ig levels are norma.  Sensi/ID of candida pending, will most likely take several weeks as has to be sent to off site lab (in [place]).   Would hold on d/c while he is febrile.  Not  clear of source- C diff (-) 5-21.  Could be due to his lung infarct/embolism but would make this dx of exclusion.  Can he come off O2? Would only consider d/c home if he has f/u with his MD within 48h.     Johny Sax Infectious Diseases [phone] 08/23/2011, 12:57 PM   LOS: 15 days

## 2011-08-23 NOTE — Progress Notes (Signed)
*  PRELIMINARY RESULTS* Echocardiogram 2D Echocardiogram with Definity has been performed.  Glean Salen [facility] 08/23/2011, 10:54 AM

## 2011-08-23 NOTE — Discharge Summary (Signed)
Internal Medicine Teaching [facility] Discharge Note  Name: [name] MRN: [id number] DOB: 1945/06/06 66 y.o.  Date of Admission: 08/08/2011 12:20 PM Date of Discharge: 08/23/2011 Attending Physician: Dr. Mariana Arn  Discharge Diagnosis: Principal Problem:  *Candidal endocarditis Active Problems:  Chronic systolic congestive heart failure  Septic embolism  Acute respiratory failure with hypoxia  CAD (coronary artery disease)  Weight loss, unintentional  Normocytic anemia  Esophageal stricture  ICD - IN SITU  FUO (fever of unknown origin)  Thrombocytopenia  Multiple pulmonary nodules  Rigors  Cotton wool spots   Discharge Medications: Medication List  As of 08/24/2011 11:15 AM   STOP taking these medications         BC HEADACHE POWDER PO      diphenhydramine-acetaminophen 25-500 MG Tabs      Ibuprofen 200 MG Caps      Phenylephrine-Acetaminophen 5-325 MG Tabs      traMADol 50 MG tablet         TAKE these medications         acetaminophen 325 MG tablet   Commonly known as: TYLENOL   Take 1-2 tablets (325-650 mg total) by mouth every 4 (four) hours as needed.      aspirin 81 MG EC tablet   Take 1 tablet (81 mg total) by mouth daily.      calcium carbonate 500 MG chewable tablet   Commonly known as: TUMS - dosed in mg elemental calcium   Chew 1 tablet by mouth 3 (three) times daily.      clonazePAM 0.5 MG tablet   Commonly known as: KLONOPIN   Take 1 tablet (0.5 mg total) by mouth 2 (two) times daily as needed for anxiety.      esomeprazole 40 MG capsule   Commonly known as: NEXIUM   Take 40 mg by mouth 2 (two) times daily.      famotidine 10 MG tablet   Commonly known as: PEPCID   Take 10 mg by mouth 2 (two) times daily.      feeding supplement Liqd   Take 237 mLs by mouth 2 (two) times daily between meals.      Flaxseed (Linseed) 1000 MG Caps   Take 1 capsule (1,000 mg total) by mouth daily.      furosemide 40 MG tablet   Commonly  known as: LASIX   Take 1 tablet (40 mg total) by mouth daily.      metoprolol 50 MG tablet   Commonly known as: LOPRESSOR   Take 0.5 tablets (25 mg total) by mouth 2 (two) times daily.      multivitamin tablet   Take 1 tablet by mouth daily with breakfast.      oxyCODONE-acetaminophen 5-325 MG per tablet   Commonly known as: PERCOCET   Take 1 tablet by mouth every 6 (six) hours as needed for pain. Do not take more than 4000 mg of acetaminophen in 24 hours. Both Tylenol and Percocet contain acetaminophen.      polyethylene glycol packet   Commonly known as: MIRALAX / GLYCOLAX   Take 17 g by mouth daily as needed.      sodium chloride 0.9 % SOLN 100 mL with micafungin 50 MG SOLR 150 mg   Inject 150 mg into the vein daily. Dispense quantity sufficient to treat until on 09/26/2011. Patient is to receive Micafungin via PICC line daily. Via home health agency. PROTECT FROM LIGHT - DO NOT SHAKE      vitamin  C 100 MG tablet   Take 100 mg by mouth daily.           Disposition and follow-up:   [name] was discharged from [facility] in Stable condition.  His fevers had largely resolved and he felt he was getting stronger. He still felt slightly short of breath and required oxygen with ambulation.  At his followup appointment with Dr. Daiva Eves, the following items should be addressed: #1 please followup on speciation studies of Candida that were sent on 08/22/2011. #2 Please follow fever course for resolution and measure o2 saturation.  #3 Please schedule follow up appointment with Dr. Orvan Falconer.  At his followup appointment with his PCP, the following items should be addressed: #1 Please follow-up on the patient's fever course after discharge and possible symptoms of CHF now that he no longer has a pacemaker #2 Please ambulate the patient while measuring oxygen saturation levels. If the patient maintains a saturation greater than 88% discontinue home  oxygen. #3 Please obtain follow-up chest CT in 6-12 months to evaluate pulmonary nodules. #4 Please repeat SPEP and QIG with IF in in steady state as outpatient. If his SPEP still abnormal, then please refer for hematology follow-up as out patient  At his followup with his cardiologist Dr. Elease Hashimoto, the following item should be addressed: #1 please assess the patient's volume status. He likely had worsening cardiac function after ICD pacemaker removal which led to CHF requiring diuresis. #2 please follow the patient to determine when and if it is appropriate to reimplant pacemaker #3 the patient's outside dose of beta blocker was dosed only once daily. He was discharged on twice daily metoprolol. Please ensure this is correct.  At his followup with Dr. Gwen Pounds, the following items should be addressed: #1 please assess for resolution of cotton-wool spots seen on exam in the hospital. #2 please ensure that ocular findings are not consistent with Candida infection.   FOLLOW UP APPOINTMENTS Follow-up Information    Follow up with [name]., MD on 09/09/2011. (Appt. time 10 am with PA, Norma Fredrickson)    Contact information:   [address] [place] [address] [phone]       Follow up with Trish Fountain, MD on 09/12/2011. (Appointment time 10 am)    Contact information:   [facility] [address] [place] [address] [phone]       Follow up with Judge Stall, MD. Schedule an appointment as soon as possible for a visit in 3 days. (Please call your primary care physician on Monday, May 27th to schedule a follow-up appointment as soon as possible)    Contact information:   [phone]      Follow up with Acey Lav, MD on 08/27/2011. (4:30 pm---Infectious Disease follow-up)    Contact information:   [address] [place] [address] [phone]          Follow-up Appointments: Discharge Orders    Future Appointments: Provider: Department: Dept Phone: Center:   08/27/2011 4:30 PM Randall Hiss, MD [facility] [phone] [facility]   09/09/2011 10:00 AM Rosalio Macadamia, NP [facility] [phone] None     Future Orders Please Complete By Expires   Diet - low sodium heart healthy      Increase activity slowly      Call MD for:      Scheduling Instructions:   Temperature greater  than 101.5   Call MD for:  persistant nausea and vomiting      Call MD for:  severe uncontrolled pain      Call MD for:  redness, tenderness, or signs of infection (pain, swelling, redness, odor or green/yellow discharge around incision site)      Call MD for:  difficulty breathing, headache or visual disturbances      Call MD for:  hives      Call MD for:  persistant dizziness or light-headedness      Call MD for:  extreme fatigue      (HEART FAILURE PATIENTS) Call MD:  Anytime you have any of the following symptoms: 1) 3 pound weight gain in 24 hours or 5 pounds in 1 week 2) shortness of breath, with or without a dry hacking cough 3) swelling in the hands, feet or stomach 4) if you have to sleep on extra pillows at night in order to breathe.      Heart Failure patients record your daily weight using the same scale at the same time of day      Avoid straining      STOP any activity that causes chest pain, shortness of breath, dizziness, sweating, or exessive weakness      Driving Restrictions      Comments:   Do not drive or operate machinery while you are taking your opoid pain medicine (Percocet) or your Bnzodiazepine (clonazepam).    Discharge instructions      Comments:   If you have questions over the weekend of your discharge page [phone] from 7AM to 5PM   Referral to Cardiac Rehab    08/22/12   Contraindication to ARB at discharge        Consultations: Infectious disease, cardiology, gastroenterology, hematology, thoracic  surgery  Procedures Performed:  Dg Chest 2 View  08/20/2011  *RADIOLOGY REPORT*  Clinical Data: Fever and cough.  Shortness of breath.  CHEST - 2 VIEW  Comparison: Chest x-ray 08/18/2011.  Findings: Multifocal interstitial and airspace opacities are again seen scattered throughout the lungs bilaterally, most pronounced throughout the right mid and lower lungs, as well as the left lower lobe.  There is blunting of the right costophrenic sulcus, compatible with a small right-sided pleural effusion.  No definite left-sided pleural effusion is noted.  Mild pulmonary venous congestion.  Heart size is mildly enlarged (unchanged). The patient is rotated to the left on today's exam, resulting in distortion of the mediastinal contours and reduced diagnostic sensitivity and specificity for mediastinal pathology.  Atherosclerotic calcifications are noted within the arch of the aorta. New right upper extremity PICC with tip terminating in the right atrium. Status post median sternotomy for CABG with LIMA.  IMPRESSION: 1. New right upper extremity PICC with tip terminating in the right atrium. 2.  Allowing for slight differences in patient positioning and depth of inspiration, the radiographic appearance of chest is otherwise essentially unchanged, as above.  Original Report Authenticated By: Florencia Reasons, M.D.   Dg Chest 2 View  08/18/2011  *RADIOLOGY REPORT*  Clinical Data: Shortness of breath, congestion, possible pneumonia, CHF  CHEST - 2 VIEW  Comparison: 08/17/2011; 08/16/2011; 08/15/2011  Findings: Grossly unchanged enlarged cardiac silhouette and mediastinal contours post median sternotomy.  Lung volumes remain persistently reduced.  Interval consolidation of peripheral heterogeneous / consolidative opacities within the right mid lung. No definite pleural effusion or pneumothorax.  Unchanged bones.  IMPRESSION: Worsening consolidative opacities within the right mid lung worrisome for  progression of  infection.  Continued attention on follow-up is recommended.  Original Report Authenticated By: Waynard Reeds, M.D.   X-ray Chest Pa And Lateral   08/08/2011  *RADIOLOGY REPORT*  Clinical Data: Fever of unknown origin.  Shortness of breath.  CHEST - 2 VIEW 08/08/2011:  Comparison: CT chest 07/18/2011 [facility] and two-view chest x-ray 07/11/2011 [facility], 10/20/2008 [facility], 05/16/2007 [facility].  Findings: Prior sternotomy for CABG.  Biventricular pacing defibrillator unchanged and appears intact.  Cardiac silhouette mildly enlarged but stable.  Thoracic aorta mildly tortuous and atherosclerotic, unchanged.  Hilar and mediastinal contours otherwise unremarkable.  Lungs clear apart from minimal scarring in the left lower lobe.  Pulmonary vascularity normal.  No pleural effusions.  Degenerative changes involving the thoracic spine.  IMPRESSION: Stable mild cardiomegaly.  Minimal scar in the left lower lobe.  No acute cardiopulmonary disease.  Original Report Authenticated By: Arnell Sieving, M.D.   Ct Angio Chest W/cm &/or Wo Cm  08/18/2011  *RADIOLOGY REPORT*  Clinical Data: Fevers, cough, and endocarditis.  CT ANGIOGRAPHY CHEST  Technique:  Multidetector CT imaging of the chest using the standard protocol during bolus administration of intravenous contrast. Multiplanar reconstructed images including MIPs were obtained and reviewed to evaluate the vascular anatomy.  Contrast: 80mL OMNIPAQUE IOHEXOL 350 MG/ML SOLN  Comparison: Chest x-ray dated 08/18/2011 and chest CT dated 07/18/2011  Findings: The patient has a massive right pulmonary embolus completely obstructing the pulmonary arteries to the right lower lobe.  There is still some flow to the right middle lobe and in and to the right upper lobe.  There are multiple smaller pulmonary emboli in the left lung primarily in the left upper lobe.  There is a moderate right pleural effusion.  There is an interstitial  infiltrate in the right lower lobe which may be secondary to pulmonary infarction.  There is a small patchy infiltrate in the superior segment of the left lower lobe which could be infarction due to an embolus.  There is a new irregular air containing fluid pocket possible hematoma at the site of the previous pacemaker generator.  This may represent a postoperative hematoma with postoperative air and given the patient's clinical history this could represent an abscess.  Heart is at the upper limits of normal in size.  Evidence of prior CABG.  There are a few small reactive mediastinal lymph nodes, more prominent than on the prior study.  Visualized portion of the upper abdomen demonstrates a few small gallstones.  IMPRESSION:  1.  Massive right pulmonary embolus with probable infarction in the right lower lobe and possibly within the lateral segment of the right middle lobe. 2.  Moderate right pleural effusion. 3.  Multiple smaller emboli in the left lung with a either a patchy pneumonia or possible pulmonary infarct in the superior segment of the left lower lobe. 4.  Cholelithiasis.  Critical Value/emergent results were called by telephone at the time of interpretation on 08/18/2011  at 7:00 p.m.  to  the patient's nurse, Efraim Kaufmann, who verbally acknowledged these results.  Original Report Authenticated By: Gwynn Burly, M.D.   Ct Hip Right W Contrast  08/10/2011  *RADIOLOGY REPORT*  Clinical Data: Right hip pain.  Fever of unknown origin.  CT OF THE RIGHT HIP WITH CONTRAST  Technique:  Multidetector CT imaging was performed following the standard protocol during bolus administration of intravenous contrast.  Contrast: OMNIPAQUE IOHEXOL 300 MG/ML  SOLN  Comparison: None.  Findings: No fluid collection is identified.  Subcutaneous and muscular tissues are unremarkable.  Fat containing right inguinal hernia is incidentally noted.  No hip joint effusion is identified. No pathologic enhancement after  contrast administration is seen. The hip is located.  There is no fracture.  No periosteal reaction or bony destructive change is seen.  IMPRESSION:  1.  No acute finding or finding to explain the patient's fever. 2.  Fat containing inguinal hernia is incidentally noted.  Original Report Authenticated By: Bernadene Bell. Maricela Curet, M.D.   Dg Chest Port 1 View  08/17/2011  *RADIOLOGY REPORT*  Clinical Data: Respiratory failure  PORTABLE CHEST - 1 VIEW  Comparison: 08/16/2011; 08/15/2011; 08/08/2011  Findings:  Grossly unchanged enlarged cardiac silhouette and 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  Interval extubation and removal of enteric tube.  The lungs remain hyperinflated.  Ill- defined heterogeneous opacities within the right mid lung, grossly unchanged.  No definite pneumothorax or pleural effusion. Unchanged bones.  IMPRESSION: 1.  Interval extubation and removal of enteric tube.  No pneumothorax. 2.  Grossly unchanged right mid lung heterogeneous opacities, possibly atelectasis, though developing infection is not excluded. Continued attention on follow-up is recommended.  Original Report Authenticated By: Waynard Reeds, M.D.   Portable Chest Xray In Am  08/16/2011  *RADIOLOGY REPORT*  Clinical Data: Evaluate lung fields and endotracheal tube position  PORTABLE CHEST - 1 VIEW  Comparison: Chest radiograph 08/15/2011  Findings: Endotracheal tube and NG tube unchanged.  Sternal wires overlie stable cardiac silhouette.  There is an increase in central venous congestion compared to prior.  Mild basilar atelectasis.  No pneumothorax.  IMPRESSION:  1.  Stable support apparatus. 2.  Increase in central venous pulmonary congestion.  Original Report Authenticated By: Genevive Bi, M.D.   Dg Chest Port 1 View  08/15/2011  *RADIOLOGY REPORT*  Clinical Data: Shortness of breath.  Cough.  Fever.  PORTABLE CHEST - 1 VIEW  Comparison: 08/08/2011  Findings: Stable appearance of postoperative changes  in the mediastinum.  The cardiac pacemaker has been removed since the previous study.  Cardiac enlargement with increased pulmonary vascularity suggesting vascular congestion.  No significant edema or consolidation.  No blunting of costophrenic angles.  No pneumothorax.  IMPRESSION: Interval removal of cardiac pacemaker.  Cardiac enlargement with pulmonary vascular congestion.  No edema or consolidation.  Original Report Authenticated By: Marlon Pel, M.D.   Portable Chest Xray  08/15/2011  *RADIOLOGY REPORT*  Clinical Data: Endotracheal tube placement.  PORTABLE CHEST - 1 VIEW  Comparison: 08/15/2011  Findings: Endotracheal tube is 3.7 cm above the carina. Nasogastric tube enters the stomach.  Prior CABG noted.  Mild cardiomegaly is present.  Mild vascular indistinctness noted without overt edema.  IMPRESSION:  1.  Endotracheal tube is satisfactorily positioned with tip 3.7 cm above the carina. 2.  Mild cardiomegaly, with borderline appearance for pulmonary venous hypertension.  No overt edema.  Original Report Authenticated By: Dellia Cloud, M.D.   Dg Bone Survey Met  08/19/2011  *RADIOLOGY REPORT*  Clinical Data: M spike on SPEP and immune suppression, evaluate for lytic lesions of multiple myeloma  METASTATIC BONE SURVEY  Comparison: Chest CT - 08/18/2011; CT abdomen pelvis - 07/18/2011; right hip CT - 08/10/2011  Findings:  There is a possible 1.2 x 2.2 cm lucency within the vertex of the calvarium.  Otherwise, no discrete lytic lesions are identified.  Degenerative change within the lumbar spine, worse at the L1 - L2 and L4 -  L5. Multilevel cervical spine degenerative change.  Post mediastinotomy and CABG.  Surgical clips overlying the medial aspect of the right upper thigh. Vascular calcifications.  IMPRESSION:  Possible 2.2 cm lucency within the vertex of the calvarium. Further evaluation with head CT of brain MRI may be performed as clinically indicated. Otherwise, no additional lucent  lesions identified.  Original Report Authenticated By: Waynard Reeds, M.D.   Dg Esophagus Dilatation  08/12/2011  CLINICAL DATA: stricture   ESOPHAGEAL DILATATION  Fluoroscopy was provided for use by the requesting physician.  No images  were obtained for radiographic interpretation.     Dg C-arm 1-60 Min-no Report  08/15/2011  CLINICAL DATA: ICD extraction   C-ARM 1-60 MINUTES  Fluoroscopy was utilized by the requesting physician for ICD pacemaker removal.   2D Echo:  Transesophageal Echocardiography  Study Date: 08/12/2011 Study Conclusions  - Left ventricle: Systolic function was severely reduced. The estimated ejection fraction was in the range of 25% to 30%. - Mitral valve: Mild regurgitation. - Left atrium: The atrium was mildly dilated. Impressions: - Large mass noted on right atrial lead (2.03 by 1.44 cm).  ------------------------------------------------------------ Left ventricle: Anterior, septal and apical akinesis. Systolic function was severely reduced. The estimated ejection fraction was in the range of 25% to 30%. ------------------------------------------------------------ Aortic valve: Trileaflet. Doppler: No regurgitation. ------------------------------------------------------------ Aorta: Descending aorta: The descending aorta had mild diffuse atherosclerotic disease. ------------------------------------------------------------ Mitral valve: Structurally normal valve. Leaflet separation was normal. Doppler: Mild regurgitation. ------------------------------------------------------------ Left atrium: The atrium was mildly dilated. ------------------------------------------------------------ Right ventricle: The cavity size was normal. Pacer wire or catheter noted in right ventricle. Systolic function was normal. ------------------------------------------------------------ Pulmonic valve: Structurally normal valve. Cusp separation was  normal. ------------------------------------------------------------ Tricuspid valve: Poorly visualized. Doppler: Trivial regurgitation. ------------------------------------------------------------ Right atrium: The atrium was normal in size. Pacer wire or catheter noted in right atrium. ------------------------------------------------------------ Pericardium: There was no pericardial effusion.   Transthoracic Echocardiography Study Date: 08/23/2011 Study Conclusions  - Left ventricle: The cavity size was moderately dilated. Wall thickness was increased in a pattern of mild LVH. The estimated ejection fraction was 30%. Diffuse hypokinesis. Septal bounce. - Aortic valve: There was no stenosis. - Mitral valve: Mild regurgitation. - Left atrium: The atrium was mildly to moderately dilated. - Right ventricle: The cavity size was normal. Systolic function was normal. - Right atrium: There is a catheter in the right atrium. The ICD has been removed. - Tricuspid valve: Peak RV-RA gradient: 33mm Hg (S). - Systemic veins: IVC was not visualized.  Impressions: - The BIV-ICD leads have been removed. There is still a catheter in the right atrium (? central line). No evidence for endocarditis was seen. The LV was moderately dilated with mild LVH. EF 30% with diffuse hypokinesis. Mild pulmonary hypertension. ------------------------------------------------------------ Left ventricle: The cavity size was moderately dilated. Wall thickness was increased in a pattern of mild LVH. The estimated ejection fraction was 30%. Diffuse hypokinesis. Septal bounce. ------------------------------------------------------------ Aortic valve: Trileaflet; mildly calcified leaflets. Doppler: There was no stenosis. No regurgitation. ------------------------------------------------------------ Aorta: Aortic root: The aortic root was normal in size. Ascending aorta: The ascending aorta was normal in  size. ------------------------------------------------------------ Mitral valve: Doppler: There was no evidence for stenosis. Mild regurgitation. ------------------------------------------------------------ Left atrium: The atrium was mildly to moderately dilated. ------------------------------------------------------------ Right ventricle: The cavity size was normal. Systolic function was normal. ------------------------------------------------------------ Pulmonic valve: Poorly visualized. ------------------------------------------------------------ Tricuspid valve: Doppler: Trivial regurgitation. ------------------------------------------------------------ Right atrium: There is a catheter in the right atrium. The ICD has been removed. The atrium  was normal in size. ------------------------------------------------------------ Pericardium: There was no pericardial effusion. ------------------------------------------------------------ Systemic veins: IVC was not visualized.   Admission HPI:  YARON GRASSE is a 66 y.o.male with past medical history significant for GERD, CAD status post 4 vessel CABG in 2003, ischemic cardiomyopathy with last EF in March 25-30% with diffuse hypokinesis, and biventricular pacemaker/ICD placement in 2009, who presents with fever of unknown origin since October 2012.  [name] states that he began to have Dysphagia to solids since 2002. He then visited a gastroenterologist who found he had esophageal stricture. He was placed on PPI and had dilation of stricture performed in 2003. He has required 8-9 dilations since then. He states that since 2003 he was developing fevers after esophageal dilation. He states he would feel febrile and diaphoretic the night after his procedure, but then it would resolve the next day. Initially it was thought this was secondary to the anesthesia, however after anesthesia it was changed the fevers persisted. He has had  numerous biopsies taken but does not know the results. He has never been told that he has cancer or precancer of the esophagus. He is currently followed by Dr. Madilyn Fireman of [facility].  In October, he started to develop high fevers to 101, again after esophageal dilation. He then began to have monthly, unprovoked fevers. This was also associated with extreme fatigue, sweats, rigoring chills and myalgias. His fevers would typically last all day for 1-2 days. Motrin brought fever down some. No cough at that time. PCP tested for influenza which was negative and prescription for zpac was given, but this did not resolve the fevers. In January the frequency of his fevers increased to twice monthly. This then started to progress to about twice each week over the past month He has been seen by Dr. Orvan Falconer of the infectious disease service in the beginning of April, who initiated workup for fever of unknown origin. Blood cultures have been negative and he has never been febrile at his clinic appointments. Dr. Orvan Falconer recommended that the patient not be started on antibiotics or corticosteroids. Unfortunately, over the past month the patient has had a progressive decline. Over the past month the patient did start to complain of right-sided headache, blurry vision in both eyes that has been progressive, limb girdle pain in the hips and shoulders and pronounced fatigue. Temporal artery biopsy was performed on 07/31/2011, however this was negative for granulomatous disease or vasculitis. The patient also endorses early satiety and now has absolutely no desire to eat. His dysphasia and dyspepsia have gotten markedly worse despite taking pantoprazole twice daily. He Endorses central chest pain without radiation, nearly daily, this is worse with solid food intake. This has been associated with nausea, vomiting, and abdominal distention x 2 months. He states he has occasional blood on his stool secondary to constipation and  hemorrhoids. Also over the past month the patient has had shortness of breath and cough that have been progressive as well. She is able to lie flat at night and does not wake up gasping for air and has no lower leg swelling. He does endorse dyspnea on exertion and extreme fatigue stating that he could not climb a flight of stairs 2/2 fatigue and even talking is tiring. His cough is nonproductive. The patient also takes care of chickens in his spare time. He denies any recent travel. 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 He denies sick contacts.  Admission Physical Exam and Labs:  Physical Exam:  Blood pressure 131/68, pulse 109, temperature 99.6 F (37.6 C), temperature source Oral, resp. rate 18, weight 192 lb 8 oz (87.317 kg), SpO2 94.00%.  Gen: Fatigued, pale appearing man in no acute distress; alert, appropriate and cooperative throughout examination. Head: Normocephalic, atraumatic.well healing surgical incision in the right temple.  Eyes: PERRL, EOMI, No signs of anemia or jaundince.  Nose: Deferred (no otophthalmoscope)  Ears: Deferred (no otophthalmoscope) Throat: Oropharynx nonerythematous, no exudate appreciated. The patient does have both upper and lower dentures. There are white plaques present underneath the dentures area. Patient states these are denture fixative. Attempt was made to wash out fixative to examine oral ulcer in the right upper buccal mucosa, however this was unsuccessful. Mild brushing with a toothbrush of the oral mucosa lead to bleeding.  Neck: Supple with no deformities, masses, or tenderness noted. No carotid Bruits, no JVD. Lungs: Normal respiratory effort. Clear to auscultation BL, without wheezes. Bibasilar inspiratory crackles.  Heart: Tachycardic with occasional extra beats. S1 and S2 normal without murmur, gallop,or rubs. Abdomen: BS normoactive. Soft, nondistended, non-tender. No masses or organomegaly. MSK/Extremities: No pretibial edema.the  patient does have deformity and wasting in the left hand status post crush injury many years ago  Neurologic: A&O X3, CN II - XII are grossly intact. Motor strength is 5/5 in the all 4 extremities, Sensations intact to light touch. No focal neurologic deficit lymphatic: I could detect no anterior/posterior cervical, axillary, or inguinal lymphadenopathy  Skin: No visible rashes. Vitilligo present on both hands. Angular chelitis present in the bilateral corners of the mouth.  Psych: mood and affect are normal.  Lab results:  Basic Metabolic Panel:   Basename  08/08/11 1645   NA  135   K  3.9   CL  99   CO2  23   GLUCOSE  110*   BUN  15   CREATININE  0.92   CALCIUM  9.4    Liver Function Tests:   Basename  08/08/11 1645   AST  32   ALT  23   ALKPHOS  72   BILITOT  0.3   PROT  7.9   ALBUMIN  2.9*    Protein gap of 5  CBC:   Basename  08/08/11 1645   WBC  5.7   NEUTROABS  3.6   HGB  12.1*   HCT  35.9*   MCV  82.3   PLT  89*    Cardiac Enzymes:   Basename  08/08/11 2126  08/08/11 1645   CKTOTAL  20  18   CKMB  1.1  1.2   TROPONINI  <0.30  <0.30    BNP:   Basename  08/08/11 1645   PROBNP  522.1*    Fasting Lipid Panel:  Lab Results   Component  Value  Date    CHOL  174  01/25/2011    HDL  33.90*  01/25/2011    LDLCALC  107*  01/25/2011    TRIG  165.0*  01/25/2011    CHOLHDL  5  01/25/2011    Thyroid Function Tests:   Basename  08/08/11 1645   TSH  1.523    Coagulation:  Lab Results   Component  Value  Date    INR  1.15  08/02/2011    INR  0.9 RATIO  05/11/2007    Urinalysis:   Basename  08/08/11 1646   COLORURINE  AMBER*   LABSPEC  1.026   PHURINE  5.5  GLUCOSEU  NEGATIVE   HGBUR  NEGATIVE   BILIRUBINUR  SMALL*   KETONESUR  NEGATIVE   PROTEINUR  NEGATIVE   UROBILINOGEN  1.0   NITRITE  NEGATIVE   LEUKOCYTESUR  NEGATIVE    Misc. 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 On 08/12/2011 GI performed EGD with esophageal dilation and saw very severe candida esophagitis. 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 Micafungin therapy was started on 08/13/11. He underwent removal of the biventricular ICD by Dr. Ladona Ridgel on 08/15/11. Approximately 3 minutes after ICD removal the patient developed transient hypotension with BP's in the 60's.  He then became hypoxic with oxygen saturations in the 70s and the patient was intubated.  Due to the patient's acute decompensation, he was started on vancomycin and zosyn for empiric broad spectrum antibiotic coverage and blood cultures were taken at that time. The patient was also started on a phenylephrine drip for ongoing hypotension. The patient improved overnight and he was extubated and off pressors the next day on 08/16/11. He remained in the ICU for observation and was transferred out of the ICU IMTS on 08/17/11. The likely cause of acute respiratory failure was pulmonary embolization of fungal mass after catheter removal. Cultures of the ICD wire were positive for Candida albicans. Speciation studies were sent and were pending at the time of discharge. Ophthalmology consult was obtained to rule out intraocular candidal infection. At discharge, arrangements were made for the patient to receive a 6 week course of micafungin, followed by PO fluconazole therapy of a duration to be determined by Dr. Orvan Falconer, his primary ID specialist.   2) Acute Ventilatory Dependent Respiratory failure due to Pulmonary Embolism/ infarction - After Mr. Schwager developed hypoxia and ventilator dependent respiratory failure and was  stabilized, a CTA was done.  This revealed a massive right pulmonary embolus with probable infarction in the right lower lobe and possibly within the lateral segment of the right middle lobe, as well as multiple smaller emboli in the left lung with either a patchy pneumonia or possible pulmonary infarct in the superior segment of the left lower lobe. PE was almost certainly secondary to fungus ball emboli during extraction. Therapeutic anticoagulation was not administered, as the emboli were not thrombic in origin. Gradually, the patient's respiratory status improved, but he remained somewhat dyspneic with exertion. He was discharged with home oxygen to manage desaturations below 88% while ambulating. Mr. Mathey SOB was likely a combination of PE/infarction and increased CHF s/p pacemaker extraction. Septic pulmonary emboli are expected to resolve with micafungin therapy. He will have a new pacemaker placed after his infection is cleared.   3) acute on chronic systolic CHF - The patient had an LV EF of 25-30% on TEE before and on TTE after pacemaker removal. His SOB was likely slightly worsened by desynchronization of his ventricles s/p pacemaker removal, but EF was largely stable.  He was discharged on furosemide 40 mg daily to decrease pulmonary edema as well as metoprolol. He was not placed on an angtiotensin modulating drug due to allergies to ACEi's and ARB's.   4) Candida esophagitis with Esophageal  stricture - EGD on 5/13 revealed extensive candida esophagitis.  He received dilation therapy to relieve esophageal stricture that had prevented feeding and TEE as an outpatient. He was initally treated with fluconazole before micafungin was added (after ICD lead veg was discovered).  He will need PO fluconazole after IV micafungin therapy is completed for a duration to be determined by his primary ID specialist, Dr. Orvan Falconer.  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 UPEP revealed increased kappa chains (28.2) in urine (kappa/lambda ratio = 20.58). 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 His calcium remained WNL, he did not have any renal dysfunction and skeletal survey was unrevealing for pathologic lytic lesions. Initial IFE (08/09/11) revealed increased IgA (537) and increased IgM (723). These could be increased acutely for his esophageal disease (IgA in mucosa) and the infection in his heart (IgM acute response to C. albicans). Repeat immunoglobulin values (08/21/11) revealed IgG (1420), IgA (385), and IgM (423). 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  It was recommended that Mr. Gottschall be retested again after his Candida infection resolves in order to assess whether or not this is a true gammopathy versus a reactive gammopathy. If it is again abnormal, he will require outpatient referral to hematology.   6) Normocytic anemia - This is acute and has developed since March when his hemoglobin was 14. His anemia panel revealed: Iron = 21, Sat ratio = 9, TIBC =243, Ferritin = 597, Retic count =1.9, FOBT negative. Peripheral smear revealed mild anemia with normal cell morphology. 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 Patient last had a colonoscopy in 2012 showing sigmoid and descending diverticula (but no recurrent polyps - last noted to have hyperplastic polyps in 2001).   7) Thrombocytopenia - Patient was admitted with low PLTC (89). Blood smear also showed thrombocytopenia with his anemia panel. Etiology is thought to be due bone marrow supression from immunosuppressed state. Platelets gradually improved with micafungin therapy. Heparin products were initially held on him until his  values reached normal range. At discharge plts were 257.  8) Weight loss, unintentional - Due to his underlying esophageal stricture and Candida growth the patient had a decreased appetite on admission and poor po intake. Admission weight = 192 lb. As his esophagus was re-dilated and his fevers began to come under control his appetite and diet advanced as expected. He was also started on strawberry Ensure BID that further helped replenish his weight loss. Discharge weight = 197 lb.  9) Pulmonary nodules - 07/18/2011 CT scan of the chest showed tiny indeterminate bilateral pulmonary nodules measuring up to  5 mm. since thes at high risk for bronchogenic carcinoma given his smoking history, follow-up chest CT at 6-12 months is recommended. This recommendation follows the consensus  Statement: Guidelines for Management of Small Pulmonary Nodules  Detected on CT Scans: A Statement from the Fleischner Society as  published in Radiology 2005; 237:395-400.  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 He was discharged on fursemide 40 mg daily, Metoprolol, and aspirin.  11) Cotton wool spots - After it was determined that the patient likely had  developed candidemia at some point, ophthalmology consult was obtained. Cotton wool spots were seen on exam in the right eye that were determined to most liekly be due to hypertension>septic emboli. The patient was recomened to follow up with a retina specialist. An appointment was made with the consulting ophthalmologist to facilitate this referral and reexamine the eyes after discharge.    Discharge Vitals:  BP 115/93  Pulse 100  Temp(Src) 98.4 F (36.9 C) (Oral)  Resp 18  Ht 5\' 10"  (1.778 m)  Wt 197 lb 6.4 oz (89.54 kg)  BMI 28.32 kg/m2  SpO2 98%  Discharge Labs:  No results found for this or any previous visit (from the past 24 hour(s)).   BMET     Component Value Date/Time   NA 133* 08/22/2011 1005   K 3.6 08/22/2011 1005   CL 96 08/22/2011 1005   CO2 27 08/22/2011 1005   GLUCOSE 143* 08/22/2011 1005   BUN 10 08/22/2011 1005   CREATININE 0.65 08/22/2011 1005   CREATININE 1.10 07/29/2011 1711   CALCIUM 8.8 08/22/2011 1005   GFRNONAA >90 08/22/2011 1005   GFRAA >90 08/22/2011 1005   Lab Results  Component Value Date   WBC 4.9 08/22/2011   HGB 8.9* 08/22/2011   HCT 27.3* 08/22/2011   MCV 83.0 08/22/2011   PLT 257 08/22/2011   Results for orders placed during the hospital encounter of 08/08/11  CULTURE, BLOOD (ROUTINE X 2)     Status: Normal   Collection Time   08/08/11  4:45 PM      Component Value Range Status Comment   Specimen Description BLOOD LEFT ARM   Final    Special Requests BOTTLES DRAWN AEROBIC AND ANAEROBIC 10CC   Final    Culture  Setup Time [id number]   Final    Culture NO GROWTH 5 DAYS   Final    Report Status 08/15/2011 FINAL   Final   CULTURE, BLOOD (ROUTINE X 2)     Status: Normal   Collection Time   08/08/11  4:45 PM      Component Value Range Status Comment   Specimen Description BLOOD LEFT ARM   Final    Special Requests BOTTLES DRAWN AEROBIC ONLY 3CC   Final    Culture  Setup Time [id number]   Final    Culture NO GROWTH 5 DAYS   Final    Report Status 08/15/2011 FINAL   Final   MRSA PCR SCREENING     Status: Normal   Collection Time   08/08/11  9:28 PM      Component Value Range Status Comment   MRSA by PCR NEGATIVE  NEGATIVE  Final   AFB CULTURE, BLOOD     Status: Normal (Preliminary result)   Collection Time   08/09/11  9:00 PM      Component Value Range Status Comment   Specimen Description BLOOD ARM RIGHT   Final    Special Requests 5CC AFB   Final    Culture     Final    Value: CULTURE WILL BE EXAMINED FOR 6 WEEKS BEFORE ISSUING A FINAL REPORT   Report Status PENDING   Incomplete   FUNGUS CULTURE, BLOOD     Status: Normal   Collection Time   08/12/11  3:18 PM      Component Value Range Status  Comment   Specimen Description BLOOD LEFT ARM   Final    Special Requests BOTTLES DRAWN AEROBIC AND ANAEROBIC 10CC   Final  Culture NO GROWTH 7 DAYS   Final    Report Status 08/21/2011 FINAL   Final   CULTURE, BLOOD (ROUTINE X 2)     Status: Normal   Collection Time   08/12/11  6:00 PM      Component Value Range Status Comment   Specimen Description BLOOD ARM RIGHT   Final    Special Requests BOTTLES DRAWN AEROBIC ONLY [facility]   Final    Culture  Setup Time [id number]   Final    Culture NO GROWTH 5 DAYS   Final    Report Status 08/19/2011 FINAL   Final   FUNGUS CULTURE, BLOOD     Status: Normal   Collection Time   08/12/11  6:00 PM      Component Value Range Status Comment   Specimen Description BLOOD RIGHT ARM   Final    Special Requests BOTTLES DRAWN AEROBIC ONLY 6CC   Final    Culture NO GROWTH 7 DAYS   Final    Report Status 08/21/2011 FINAL   Final   CULTURE, BLOOD (ROUTINE X 2)     Status: Normal   Collection Time   08/12/11  6:15 PM      Component Value Range Status Comment   Specimen Description BLOOD ARM LEFT   Final    Special Requests BOTTLES DRAWN AEROBIC AND ANAEROBIC 10CC   Final    Culture  Setup Time [id number]   Final    Culture NO GROWTH 5 DAYS   Final    Report Status 08/19/2011 FINAL   Final   CULTURE, BLOOD (ROUTINE X 2)     Status: Normal   Collection Time   08/13/11 12:40 AM      Component Value Range Status Comment   Specimen Description BLOOD RIGHT ARM   Final    Special Requests BOTTLES DRAWN AEROBIC AND ANAEROBIC 5CC   Final    Culture  Setup Time [id number]   Final    Culture     Final    Value: STAPHYLOCOCCUS SPECIES (COAGULASE NEGATIVE)     Note: THE SIGNIFICANCE OF ISOLATING THIS ORGANISM FROM A SINGLE SET OF BLOOD CULTURES WHEN MULTIPLE SETS ARE DRAWN IS UNCERTAIN. PLEASE NOTIFY THE MICROBIOLOGY DEPARTMENT WITHIN ONE WEEK IF SPECIATION AND SENSITIVITIES ARE REQUIRED.     Note: Gram Stain Report Called to,Read Back By and Verified With: NADINE  WELLINGTON 08/14/2011 7:51AM YIMSU   Report Status 08/15/2011 FINAL   Final   CULTURE, BLOOD (ROUTINE X 2)     Status: Normal   Collection Time   08/13/11 12:43 AM      Component Value Range Status Comment   Specimen Description BLOOD RIGHT HAND   Final    Special Requests BOTTLES DRAWN AEROBIC AND ANAEROBIC [facility]   Final    Culture  Setup Time [id number]   Final    Culture     Final    Value: PROPIONIBACTERIUM SPECIES     Note: Gram Stain Report Called to,Read Back By and Verified With: Sallee Lange RN on 08/17/11 at 21:42 by Christie Nottingham PREVIOUSLY REPORTED AS NO GROWTH 5 DAYS CORRECTED RESULTS CALLED TO: SAMANTHA POWELL ON 3700 AT [id number] CASTILLO C   Report Status 08/20/2011 FINAL   Final   AFB CULTURE, BLOOD     Status: Normal (Preliminary result)   Collection Time   08/13/11 12:45 AM      Component Value Range Status Comment   Specimen Description BLOOD RIGHT HAND   Final  Special Requests 5CC   Final    Culture     Final    Value: CULTURE WILL BE EXAMINED FOR 6 WEEKS BEFORE ISSUING A FINAL REPORT   Report Status PENDING   Incomplete   WOUND CULTURE     Status: Normal   Collection Time   08/15/11  4:08 PM      Component Value Range Status Comment   Specimen Description WOUND   Final    Special Requests INFECTED PACING LEAD   Final    Gram Stain     Final    Value: NO WBC SEEN     NO SQUAMOUS EPITHELIAL CELLS SEEN     NO ORGANISMS SEEN   Culture FEW CANDIDA ALBICANS   Final    Report Status 08/18/2011 FINAL   Final   FUNGUS CULTURE W SMEAR     Status: Normal (Preliminary result)   Collection Time   08/15/11  4:08 PM      Component Value Range Status Comment   Specimen Description WOUND   Final    Special Requests INFECTED PACING LEAD   Final    Fungal Smear FEW HYPHAE   Final    Culture CANDIDA ALBICANS   Final    Report Status PENDING   Incomplete   WOUND CULTURE     Status: Normal   Collection Time   08/15/11  4:10 PM      Component Value Range Status Comment    Specimen Description WOUND   Final    Special Requests ICD DEVICE POCKET SWAB REC'D   Final    Gram Stain     Final    Value: NO WBC SEEN     NO SQUAMOUS EPITHELIAL CELLS SEEN     NO ORGANISMS SEEN   Culture NO GROWTH 2 DAYS   Final    Report Status 08/18/2011 FINAL   Final   FUNGUS CULTURE W SMEAR     Status: Normal (Preliminary result)   Collection Time   08/15/11  4:10 PM      Component Value Range Status Comment   Specimen Description WOUND   Final    Special Requests ICD DEVICE POCKET SWAB REC'D   Final    Fungal Smear NO YEAST OR FUNGAL ELEMENTS SEEN   Final    Culture CULTURE IN PROGRESS FOR FOUR WEEKS   Final    Report Status PENDING   Incomplete   CULTURE, BLOOD (ROUTINE X 2)     Status: Normal   Collection Time   08/16/11  4:05 AM      Component Value Range Status Comment   Specimen Description BLOOD RIGHT WRIST   Final    Special Requests BOTTLES DRAWN AEROBIC ONLY 1CC   Final    Culture  Setup Time [id number]   Final    Culture NO GROWTH 5 DAYS   Final    Report Status 08/22/2011 FINAL   Final   CULTURE, BLOOD (ROUTINE X 2)     Status: Normal   Collection Time   08/16/11  4:15 AM      Component Value Range Status Comment   Specimen Description BLOOD RIGHT HAND   Final    Special Requests BOTTLES DRAWN AEROBIC ONLY [facility]   Final    Culture  Setup Time [id number]   Final    Culture NO GROWTH 5 DAYS   Final    Report Status 08/22/2011 FINAL   Final   CLOSTRIDIUM DIFFICILE BY PCR  Status: Normal   Collection Time   08/20/11 12:48 PM      Component Value Range Status Comment   C difficile by pcr NEGATIVE  NEGATIVE  Final   CULTURE, BLOOD (ROUTINE X 2)     Status: Normal (Preliminary result)   Collection Time   08/22/11  2:20 PM      Component Value Range Status Comment   Specimen Description BLOOD RIGHT HAND   Final    Special Requests     Final    Value: BOTTLES DRAWN AEROBIC AND ANAEROBIC AERO 10CC ANA 8CC   Culture  Setup Time [id number]   Final    Culture      Final    Value:        BLOOD CULTURE RECEIVED NO GROWTH TO DATE CULTURE WILL BE HELD FOR 5 DAYS BEFORE ISSUING A FINAL NEGATIVE REPORT   Report Status PENDING   Incomplete   CULTURE, BLOOD (ROUTINE X 2)     Status: Normal (Preliminary result)   Collection Time   08/22/11  3:15 PM      Component Value Range Status Comment   Specimen Description BLOOD HAND RIGHT   Final    Special Requests BOTTLES DRAWN AEROBIC ONLY 3CC   Final    Culture  Setup Time [id number]   Final    Culture     Final    Value:        BLOOD CULTURE RECEIVED NO GROWTH TO DATE CULTURE WILL BE HELD FOR 5 DAYS BEFORE ISSUING A FINAL NEGATIVE REPORT   Report Status PENDING   Incomplete     Results for LEVEON, PELZER (MRN [id number]) as of 08/24/2011 11:00  Ref. Range 08/09/2011 17:19 08/09/2011 17:19 08/21/2011 11:00  Total Protein ELP Latest Range: 6.0-8.3 g/dL 7.0 6.8   Albumin ELP Latest Range: 55.8-66.1 %  40.8 (L)   Alpha-1-Globulin Latest Range: 2.9-4.9 %  8.4 (H)   Alpha-2-Globulin Latest Range: 7.1-11.8 %  10.7   Beta Globulin Latest Range: 4.7-7.2 %  5.5   Beta 2 Latest Range: 3.2-6.5 %  7.4 (H)   Gamma Globulin Latest Range: 11.1-18.8 %  27.2 (H)   M-SPIKE, % No range found  0.60   SPE Interp. No range found  (NOTE)   Comment No range found  (NOTE)   IgG (Immunoglobin G), Serum Latest Range: 8436454330 mg/dL 4696  2952  IgA Latest Range: 68-379 mg/dL 841 (H)  324 (H)  IgM, Serum Latest Range: 41-251 mg/dL 401 (H)  027 (H)    Time spent in discharge (includes decision making & examination of pt): Greater than 35 minutes  Signed: Jaxon Mynhier 08/24/2011, 11:15 AM

## 2011-08-23 NOTE — Progress Notes (Signed)
Internal Medicine Attending  Date: 08/23/2011  Patient name: [name] Medical record number: [id number] Date of birth: [birth year] Age: 66 y.o. Gender: male  I saw and evaluated the patient. I reviewed the resident's note by Dr. Candy Sledge and I agree with the resident's findings and plans as documented in his progress note.  [name] was seen on rounds this morning. His breathing was improved and he felt he had more energy. He was very much looking forward to going home today. Although he continues to spike fevers we have no other source identified except for the fungal pulmonary embolism. Home care has been arranged including continued micafungin. Followup has been arranged in the infectious disease clinic as well as with his primary care provider. I agree with the housestaff's plan to discharge him home today.

## 2011-08-23 NOTE — Progress Notes (Signed)
Ambulated pt in hallway one last time before discharge sats dropped to 89% and rebounded back up to 95% on room air. Resting sats were 96% before ambulation. Pt stated he felt great and not Short of breath and felt like walking some more.

## 2011-08-23 NOTE — Progress Notes (Signed)
Resident Co-sign Daily Note: I have seen the patient and reviewed the daily progress note by Lewie Chamber MS 3 and discussed the care of the patient with them.  See below for documentation of my findings, assessment, and plans.  Subjective:  The patient is feeling improved today. He feels like his breathing is better his energy is getting better. He feels stronger and wants very much to go home. He had a bowel movement today. Objective: Vital signs in last 24 hours: Filed Vitals:   08/23/11 0620 08/23/11 0852 08/23/11 1103 08/23/11 1358  BP:    115/93  Pulse:    100  Temp: 98.6 F (37 C) 101.3 F (38.5 C) 98.7 F (37.1 C) 98.4 F (36.9 C)  TempSrc: Oral Oral Oral Oral  Resp:    18  Height:      Weight:      SpO2:    98%   Physical Exam: General: resting in bed, NAD  Cardiac: tachycardic, but regular, no rubs, murmurs or gallops  Pulm: Left lung fields are clear to auscultation. Right lung base there are inspiratory crackles slightly improved from yesterday. Abdomen soft positive bowel sounds MSK: Able to perform sit up with less assistance today Ext. Warm and dry with no pedal edema  Lab Results: Reviewed and documented in Electronic Record Micro Results: Reviewed and documented in Electronic Record Studies/Results: Reviewed and documented in Electronic Record Medications: I have reviewed the patient's current medications. Scheduled Meds:   . antiseptic oral rinse  15 mL Mouth Rinse QID  . aspirin EC  81 mg Oral Daily  . chlorhexidine  15 mL Mouth/Throat BID  . enoxaparin (LOVENOX) injection  40 mg Subcutaneous Q24H  . feeding supplement  237 mL Oral BID BM  . furosemide  40 mg Oral Daily  . gentamicin irrigation   Irrigation Once  . metoprolol  25 mg Oral BID  . micafungin [facility]) IV  150 mg Intravenous Daily  . mulitivitamin with minerals  1 tablet Oral Daily  . pantoprazole  40 mg Oral Q1200  . polyethylene glycol  17 g Oral Daily   Continuous Infusions:    PRN Meds:.acetaminophen, clonazePAM, metoprolol, ondansetron (ZOFRAN) IV, oxyCODONE-acetaminophen, sodium chloride, traZODone Assessment/Plan:  patient will be discharged today please see discharge summary for relevant information.  LOS: 15 days   Burlon Centrella 08/23/2011, 3:07 PM

## 2011-08-23 NOTE — Progress Notes (Signed)
Medical Student Daily Progress Note  Subjective: Patient reports he is feeling better this morning. He did sleep well through the night and is not aching all over as he used to. He did not have a fever overnight but he did this morning around 9 am (101.3). He says PT got him up yesterday and he was able to ambulate well, however his oxygen sats did drop to around 87-89% but returned to 90-92% with rest soon after.  He denies any chills, rigors, night sweats, N/V/D. He is resting in bed comfortably upon exam.   Objective: Vital signs in last 24 hours: Filed Vitals:   08/23/11 0500 08/23/11 0620 08/23/11 0852 08/23/11 1103  BP: 179/77     Pulse: 101     Temp: 98.4 F (36.9 C) 98.6 F (37 C) 101.3 F (38.5 C) 98.7 F (37.1 C)  TempSrc: Oral Oral Oral Oral  Resp: 18     Height:      Weight: 89.54 kg (197 lb 6.4 oz)     SpO2: 95%      Weight change: -4.037 kg (-8 lb 14.4 oz)  Intake/Output Summary (Last 24 hours) at 08/23/11 1310 Last data filed at 08/23/11 1914  Gross per 24 hour  Intake      0 ml  Output    575 ml  Net   -575 ml   Physical Exam: BP 179/77  Pulse 101  Temp(Src) 98.7 F (37.1 C) (Oral)  Resp 18  Ht 5\' 10"  (1.778 m)  Wt 89.54 kg (197 lb 6.4 oz)  BMI 28.32 kg/m2  SpO2 95% General appearance: alert, cooperative and no distress Head: Normocephalic, without obvious abnormality, atraumatic Neck: no adenopathy, no carotid bruit, no JVD, supple, symmetrical, trachea midline and thyroid not enlarged, symmetric, no tenderness/mass/nodules Back: symmetric, no curvature. ROM normal. No CVA tenderness. Lungs: Left: clear  Right: crackles RML/RLL Chest wall: no tenderness Heart: regular rate and rhythm, S1, S2 normal, no murmur, click, rub or gallop Abdomen: soft, non-tender; bowel sounds normal; no masses,  no organomegaly Extremities: extremities normal, atraumatic, no cyanosis or edema Skin: Skin color, texture, turgor normal. No rashes or lesions Lab  Results: Basic Metabolic Panel:  Lab 08/22/11 7829 08/20/11 1055 08/20/11 0605  NA 133* -- 134*  K 3.6 -- 4.0  CL 96 -- 98  CO2 27 -- 28  GLUCOSE 143* -- 103*  BUN 10 -- 11  CREATININE 0.65 -- 0.73  CALCIUM 8.8 -- 8.8  MG -- 1.9 --  PHOS -- -- --   Liver Function Tests:  Lab 08/22/11 1005 08/20/11 0605  AST 21 27  ALT 13 17  ALKPHOS 80 196*  BILITOT 0.3 0.4  PROT 6.6 6.7  ALBUMIN 1.9* 2.0*   No results found for this basename: LIPASE:2,AMYLASE:2 in the last 168 hours No results found for this basename: AMMONIA:2 in the last 168 hours CBC:  Lab 08/22/11 0545 08/21/11 0540 08/20/11 0605  WBC 4.9 6.8 --  NEUTROABS -- 5.0 7.8*  HGB 8.9* 8.2* --  HCT 27.3* 26.2* --  MCV 83.0 82.9 --  PLT 257 220 --   Cardiac Enzymes: No results found for this basename: CKTOTAL:3,CKMB:3,CKMBINDEX:3,TROPONINI:3 in the last 168 hours BNP:  Lab 08/17/11 0415  PROBNP 4005.0*   D-Dimer: No results found for this basename: DDIMER:2 in the last 168 hours CBG:  Lab 08/20/11 1644  GLUCAP 126*   Hemoglobin A1C: No results found for this basename: HGBA1C in the last 168 hours Fasting Lipid Panel: No  results found for this basename: CHOL,HDL,LDLCALC,TRIG,CHOLHDL,LDLDIRECT in the last 295 hours Thyroid Function Tests: No results found for this basename: TSH,T4TOTAL,FREET4,T3FREE,THYROIDAB in the last 168 hours Coagulation: No results found for this basename: LABPROT:4,INR:4 in the last 168 hours Anemia Panel: No results found for this basename: VITAMINB12,FOLATE,FERRITIN,TIBC,IRON,RETICCTPCT in the last 168 hours Urine Drug Screen: Drugs of Abuse  No results found for this basename: labopia, cocainscrnur, labbenz, amphetmu, thcu, labbarb    Alcohol Level: No results found for this basename: ETH:2 in the last 168 hours Urinalysis: No results found for this basename:  COLORURINE:2,APPERANCEUR:2,LABSPEC:2,PHURINE:2,GLUCOSEU:2,HGBUR:2,BILIRUBINUR:2,KETONESUR:2,PROTEINUR:2,UROBILINOGEN:2,NITRITE:2,LEUKOCYTESUR:2 in the last 168 hours  Micro Results: Recent Results (from the past 240 hour(s))  WOUND CULTURE     Status: Normal   Collection Time   08/15/11  4:08 PM      Component Value Range Status Comment   Specimen Description WOUND   Final    Special Requests INFECTED PACING LEAD   Final    Gram Stain     Final    Value: NO WBC SEEN     NO SQUAMOUS EPITHELIAL CELLS SEEN     NO ORGANISMS SEEN   Culture FEW CANDIDA ALBICANS   Final    Report Status 08/18/2011 FINAL   Final   FUNGUS CULTURE W SMEAR     Status: Normal (Preliminary result)   Collection Time   08/15/11  4:08 PM      Component Value Range Status Comment   Specimen Description WOUND   Final    Special Requests INFECTED PACING LEAD   Final    Fungal Smear FEW HYPHAE   Final    Culture CANDIDA ALBICANS   Final    Report Status PENDING   Incomplete   WOUND CULTURE     Status: Normal   Collection Time   08/15/11  4:10 PM      Component Value Range Status Comment   Specimen Description WOUND   Final    Special Requests ICD DEVICE POCKET SWAB REC'D   Final    Gram Stain     Final    Value: NO WBC SEEN     NO SQUAMOUS EPITHELIAL CELLS SEEN     NO ORGANISMS SEEN   Culture NO GROWTH 2 DAYS   Final    Report Status 08/18/2011 FINAL   Final   FUNGUS CULTURE W SMEAR     Status: Normal (Preliminary result)   Collection Time   08/15/11  4:10 PM      Component Value Range Status Comment   Specimen Description WOUND   Final    Special Requests ICD DEVICE POCKET SWAB REC'D   Final    Fungal Smear NO YEAST OR FUNGAL ELEMENTS SEEN   Final    Culture CULTURE IN PROGRESS FOR FOUR WEEKS   Final    Report Status PENDING   Incomplete   CULTURE, BLOOD (ROUTINE X 2)     Status: Normal   Collection Time   08/16/11  4:05 AM      Component Value Range Status Comment   Specimen Description BLOOD RIGHT WRIST    Final    Special Requests BOTTLES DRAWN AEROBIC ONLY 1CC   Final    Culture  Setup Time [id number]   Final    Culture NO GROWTH 5 DAYS   Final    Report Status 08/22/2011 FINAL   Final   CULTURE, BLOOD (ROUTINE X 2)     Status: Normal   Collection Time   08/16/11  4:15  AM      Component Value Range Status Comment   Specimen Description BLOOD RIGHT HAND   Final    Special Requests BOTTLES DRAWN AEROBIC ONLY [facility]   Final    Culture  Setup Time [id number]   Final    Culture NO GROWTH 5 DAYS   Final    Report Status 08/22/2011 FINAL   Final   CLOSTRIDIUM DIFFICILE BY PCR     Status: Normal   Collection Time   08/20/11 12:48 PM      Component Value Range Status Comment   C difficile by pcr NEGATIVE  NEGATIVE  Final   CULTURE, BLOOD (ROUTINE X 2)     Status: Normal (Preliminary result)   Collection Time   08/22/11  2:20 PM      Component Value Range Status Comment   Specimen Description BLOOD RIGHT HAND   Final    Special Requests     Final    Value: BOTTLES DRAWN AEROBIC AND ANAEROBIC AERO 10CC ANA 8CC   Culture  Setup Time [id number]   Final    Culture     Final    Value:        BLOOD CULTURE RECEIVED NO GROWTH TO DATE CULTURE WILL BE HELD FOR 5 DAYS BEFORE ISSUING A FINAL NEGATIVE REPORT   Report Status PENDING   Incomplete   CULTURE, BLOOD (ROUTINE X 2)     Status: Normal (Preliminary result)   Collection Time   08/22/11  3:15 PM      Component Value Range Status Comment   Specimen Description BLOOD HAND RIGHT   Final    Special Requests BOTTLES DRAWN AEROBIC ONLY 3CC   Final    Culture  Setup Time [id number]   Final    Culture     Final    Value:        BLOOD CULTURE RECEIVED NO GROWTH TO DATE CULTURE WILL BE HELD FOR 5 DAYS BEFORE ISSUING A FINAL NEGATIVE REPORT   Report Status PENDING   Incomplete    Studies/Results: No results found. Medications:  I have reviewed the patient's current medications. Prior to Admission:  Prescriptions prior to admission  Medication  Sig Dispense Refill  . Ascorbic Acid (VITAMIN C) 100 MG tablet Take 100 mg by mouth daily.      . calcium carbonate (TUMS - DOSED IN MG ELEMENTAL CALCIUM) 500 MG chewable tablet Chew 1 tablet by mouth 3 (three) times daily.      . diphenhydramine-acetaminophen (TYLENOL PM) 25-500 MG TABS Take 1 tablet by mouth at bedtime as needed. For pain      . esomeprazole (NEXIUM) 40 MG capsule Take 40 mg by mouth 2 (two) times daily.       . famotidine (PEPCID) 10 MG tablet Take 10 mg by mouth 2 (two) times daily.      . Ibuprofen 200 MG CAPS Take 600-800 mg by mouth every 6 (six) hours as needed. Depending on fever      . metoprolol (LOPRESSOR) 50 MG tablet Take 25 mg by mouth daily. Takes in am      . Multiple Vitamins-Minerals (MULTIVITAMIN) tablet Take 1 tablet by mouth daily with breakfast.   30 tablet    . Phenylephrine-Acetaminophen 5-325 MG TABS Take 1 tablet by mouth every 4 (four) hours as needed. For sinus headache      . traMADol (ULTRAM) 50 MG tablet Take 1 tablet (50 mg total) by mouth every 6 (six) hours as  needed for pain.  20 tablet  0  . DISCONTD: acetaminophen (TYLENOL) 500 MG tablet Take 500 mg by mouth every 6 (six) hours as needed. For pain.      [name] DISCONTD: Aspirin-Salicylamide-Caffeine (BC HEADACHE POWDER PO) Take 1 Package by mouth daily as needed. For headaches.      [name] DISCONTD: Flaxseed, Linseed, 1000 MG CAPS Take 1 capsule by mouth daily.       Anti-infectives     Start     Dose/Rate Route Frequency Ordered Stop   08/15/11 2200   piperacillin-tazobactam (ZOSYN) IVPB 3.375 g  Status:  Discontinued        3.375 g 12.5 mL/hr over 240 Minutes Intravenous 3 times per day 08/15/11 1758 08/21/11 1039   08/15/11 1830   vancomycin (VANCOCIN) 1,250 mg in sodium chloride 0.9 % 250 mL IVPB  Status:  Discontinued        1,250 mg 166.7 mL/hr over 90 Minutes Intravenous Every 12 hours 08/15/11 1758 08/18/11 1452   08/15/11 1535   gentamycin 80 mg in 0.9% normal saline 250 mL irrigation   Status:  Discontinued          As needed 08/15/11 1535 08/15/11 1608   08/15/11 1515   gentamycin 80 mg in 0.9% normal saline 250 mL irrigation  Status:  Discontinued        1 application Irrigation  Once 08/15/11 1507 08/15/11 1509   08/15/11 1515   gentamicin (GARAMYCIN) 80 mg in sodium chloride irrigation 0.9 % 500 mL irrigation         Irrigation Once 08/15/11 1510     08/14/11 1500   gentamicin (GARAMYCIN) 80 mg in sodium chloride irrigation 0.9 % 500 mL irrigation  Status:  Discontinued        80 mg Irrigation On call 08/14/11 1412 08/15/11 1819   08/14/11 1500   ceFAZolin (ANCEF) IVPB 2 g/50 mL premix        2 g 100 mL/hr over 30 Minutes Intravenous On call 08/14/11 1412 08/15/11 1430   08/13/11 1200   micafungin (MYCAMINE) 150 mg in sodium chloride 0.9 % 100 mL IVPB     Comments: Wait until fungal blood cultures are drawn before starting      150 mg 100 mL/hr over 1 Hours Intravenous Daily 08/13/11 0948     08/13/11 1000   fluconazole (DIFLUCAN) tablet 100 mg  Status:  Discontinued        100 mg Oral Daily 08/12/11 1125 08/13/11 0948   08/12/11 1200   fluconazole (DIFLUCAN) tablet 200 mg        200 mg Oral Daily 08/12/11 1125 08/12/11 1426         Scheduled Meds:   . antiseptic oral rinse  15 mL Mouth Rinse QID  . aspirin EC  81 mg Oral Daily  . chlorhexidine  15 mL Mouth/Throat BID  . enoxaparin (LOVENOX) injection  40 mg Subcutaneous Q24H  . feeding supplement  237 mL Oral BID BM  . furosemide  40 mg Oral Daily  . gentamicin irrigation   Irrigation Once  . metoprolol  25 mg Oral BID  . micafungin [facility]) IV  150 mg Intravenous Daily  . mulitivitamin with minerals  1 tablet Oral Daily  . pantoprazole  40 mg Oral Q1200  . polyethylene glycol  17 g Oral Daily   Continuous Infusions:  PRN Meds:.acetaminophen, clonazePAM, metoprolol, ondansetron (ZOFRAN) IV, oxyCODONE-acetaminophen, sodium chloride, traZODone Assessment/Plan:  Candidal endocarditis (FKO) -  Tmax =  101.3. Patient did not have a fever overnight but did spike one this morning around 9 am. It is highly likely that his fever is now 2/2 the Candida extension in his lung and as the mass continues to degrade then his body will mount a proper response to the disseminating infection. His wife will maintain a fever log and his response to Tylenol in the outpatient setting to continue tracking any fevers he may have between discharge and follow-up. -HHPT/RN for IV teaching -continue micafungin (08/13/11) for total of 6 weeks (currently day 11) -Patient will follow up with ID next week  Pulmonary infiltrate - Patient will remain on lasix 40 mg daily for his cardio-pulmonary issues. He continues to diurese well and his left lung is much improved over the last couple of days. His right lung will take time 2/2 candidal embolus, effusion, and infarcted lung parenchyma.  -continue lasix 40 mg daily  Monoclonal gammopathy - His repeat IgG/A/M revealed a normal range IgG=1420 and still an increased IgA and IgM however overall the values were less than previous values.  IgA (537>>385), IgM (723>>423). This further could explain the reason that the values were high simply due to the normal physiological response to his infection. Outpatient follow-up is recommended after he resolves from this large acute episode with retesting of his SPEP and QIG with IF.  - Appreciate hematology consult in the management of this patient  - he will be retested as an outpatient after this episode resolves and if SPEP still abnormal then will refer to hematology at that time  GERD/Esophageal stricture - He is s/p esophageal dilation on 08/12/11. He has had a history of esophageal dilation s/p strictures and candidiasis and poor po intake.  - Regular diet, well tolerated  - continue protonix  Ischemic cardiomyopathy - Stable. He is started on lasix for some dyspnea and increasing fluid in this legs/feet. Very good response. 2D  echo 08/15/11 reveals LVEF = 25% and diffuse hypokinesis. Mild-moderate MVR, mildly dilated LA, mild TVR, and paradoxical septal motion.  - repeat TTE today for surveillance - Appreciate cardiology recommendations - continue lasix 40 mg daily - continue metoprolol BID  - continue ASA   Normocytic anemia - Hgb =8.9, MCV =83. This is acute and has developed since March when hemoglobin was 14. Anemia panel revealing: Iron = 21, Sat ratio = 9, TIBC =243, Ferritin = 597, Retic count =1.9, FOBT negative. Smear revealed mild anemia with normal morphology. Still consistent with AOCD given that TIBC is normal (elevated in IDA) and ferritin high although could still be a component of acute phase reactant. Pt had last colonoscopy in 2012 showing sigmoid and descending diverticula (but no recurrent polyps - last noted to have hyperplastic polyps in 2001).   Thrombocytopenia - PLTC = 257 (08/22/11). Blood smear also revealed thrombocytopenia at admission. His PLTC trended back up to normal range throughout his hospital stay. - began lovenox for DVT on 5/21 2/2 normal PLTC  - resolved  Weight loss, unintentional - improving with fluids and food. Weight loss was likely secondary to decreased by mouth intake and underlying infection. Now eating well.  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 Multiple causes for this include  extension of Candida infection vs. Vitamin B2 deficiency (less likely) vs. Iron deficiency anemia (AOCD more likely at this point), all of which are treatable conditions in this patient. As final diagnosis nears closer, treatment options for this will narrow as well.  - likely will resolve as his esophageal candida resolves completely    Constipation - He has had spells of decreased po intake during his spells of dysphagia. He has been on a regular diet the last several days with no problems of constipation anymore.  - resolved  - continue miralax as needed  - C. Diff NEGATIVE 2/2 two episodes of loose, watery stools  CAD h/o CABG x 4 (2003) - Stable. CE negative x 3 on admission.  - continue metoprolol  - continue ASA  - no statin, ACEi, or ARB 2/2 allergy  - lasix 40 mg daily  DVT ppx - Lovenox  Disposition - discharge home today; he will have close follow-up next week with his PCP and ID. Remaining follow-ups with cardiology and ophthalmology are soon after. He will need a hematology follow-up but not for quite some time until he is considered stable and well treated for this Candida infection. His wife will bring a fever log with her to his appointments.    LOS: 15 days   This is a Psychologist, occupational Note.  The care of the patient was discussed with Dr. Quentin Ore and the assessment and plan formulated with their assistance.  Please see their attached note for official documentation of the daily encounter.  Lewie Chamber 08/23/2011, 1:10 PM

## 2011-08-24 DIAGNOSIS — H3581 Retinal edema: Secondary | ICD-10-CM

## 2011-08-26 MED FILL — Perflutren Lipid Microsphere IV Susp 6.52 MG/ML: INTRAVENOUS | Qty: 2 | Status: AC

## 2011-08-27 ENCOUNTER — Encounter: Payer: Self-pay | Admitting: Infectious Disease

## 2011-08-27 ENCOUNTER — Ambulatory Visit (INDEPENDENT_AMBULATORY_CARE_PROVIDER_SITE_OTHER): Payer: Medicare Other | Admitting: Infectious Disease

## 2011-08-27 ENCOUNTER — Inpatient Hospital Stay (HOSPITAL_COMMUNITY): Payer: Medicare Other

## 2011-08-27 ENCOUNTER — Encounter (HOSPITAL_COMMUNITY): Payer: Self-pay | Admitting: *Deleted

## 2011-08-27 ENCOUNTER — Inpatient Hospital Stay (HOSPITAL_COMMUNITY)
Admission: AD | Admit: 2011-08-27 | Discharge: 2011-09-14 | DRG: 288 | Disposition: A | Discharge status: Home / Self Care | Payer: Medicare Other | Source: Ambulatory Visit | Attending: Internal Medicine | Admitting: Internal Medicine

## 2011-08-27 VITALS — BP 119/76 | HR 120 | Temp 99.2°F | Ht 70.0 in | Wt 182.0 lb

## 2011-08-27 DIAGNOSIS — I39 Endocarditis and heart valve disorders in diseases classified elsewhere: Secondary | ICD-10-CM

## 2011-08-27 DIAGNOSIS — Z8249 Family history of ischemic heart disease and other diseases of the circulatory system: Secondary | ICD-10-CM

## 2011-08-27 DIAGNOSIS — B377 Candidal sepsis: Secondary | ICD-10-CM | POA: Diagnosis present

## 2011-08-27 DIAGNOSIS — Y998 Other external cause status: Secondary | ICD-10-CM

## 2011-08-27 DIAGNOSIS — I5022 Chronic systolic (congestive) heart failure: Secondary | ICD-10-CM | POA: Diagnosis present

## 2011-08-27 DIAGNOSIS — I330 Acute and subacute infective endocarditis: Principal | ICD-10-CM | POA: Diagnosis present

## 2011-08-27 DIAGNOSIS — I5023 Acute on chronic systolic (congestive) heart failure: Secondary | ICD-10-CM | POA: Diagnosis present

## 2011-08-27 DIAGNOSIS — I82409 Acute embolism and thrombosis of unspecified deep veins of unspecified lower extremity: Secondary | ICD-10-CM | POA: Diagnosis not present

## 2011-08-27 DIAGNOSIS — E440 Moderate protein-calorie malnutrition: Secondary | ICD-10-CM | POA: Diagnosis present

## 2011-08-27 DIAGNOSIS — H3581 Retinal edema: Secondary | ICD-10-CM

## 2011-08-27 DIAGNOSIS — I252 Old myocardial infarction: Secondary | ICD-10-CM

## 2011-08-27 DIAGNOSIS — F411 Generalized anxiety disorder: Secondary | ICD-10-CM | POA: Diagnosis present

## 2011-08-27 DIAGNOSIS — I472 Ventricular tachycardia, unspecified: Secondary | ICD-10-CM | POA: Diagnosis not present

## 2011-08-27 DIAGNOSIS — I339 Acute and subacute endocarditis, unspecified: Secondary | ICD-10-CM

## 2011-08-27 DIAGNOSIS — E46 Unspecified protein-calorie malnutrition: Secondary | ICD-10-CM | POA: Diagnosis present

## 2011-08-27 DIAGNOSIS — Z87891 Personal history of nicotine dependence: Secondary | ICD-10-CM

## 2011-08-27 DIAGNOSIS — B961 Klebsiella pneumoniae [K. pneumoniae] as the cause of diseases classified elsewhere: Secondary | ICD-10-CM | POA: Diagnosis present

## 2011-08-27 DIAGNOSIS — I76 Septic arterial embolism: Secondary | ICD-10-CM | POA: Diagnosis present

## 2011-08-27 DIAGNOSIS — I509 Heart failure, unspecified: Secondary | ICD-10-CM | POA: Diagnosis present

## 2011-08-27 DIAGNOSIS — I501 Left ventricular failure: Secondary | ICD-10-CM

## 2011-08-27 DIAGNOSIS — J90 Pleural effusion, not elsewhere classified: Secondary | ICD-10-CM | POA: Diagnosis present

## 2011-08-27 DIAGNOSIS — T827XXA Infection and inflammatory reaction due to other cardiac and vascular devices, implants and grafts, initial encounter: Secondary | ICD-10-CM | POA: Diagnosis present

## 2011-08-27 DIAGNOSIS — Y9210 Unspecified residential institution as the place of occurrence of the external cause: Secondary | ICD-10-CM | POA: Diagnosis present

## 2011-08-27 DIAGNOSIS — I2589 Other forms of chronic ischemic heart disease: Secondary | ICD-10-CM | POA: Diagnosis present

## 2011-08-27 DIAGNOSIS — R509 Fever, unspecified: Secondary | ICD-10-CM | POA: Diagnosis present

## 2011-08-27 DIAGNOSIS — Y849 Medical procedure, unspecified as the cause of abnormal reaction of the patient, or of later complication, without mention of misadventure at the time of the procedure: Secondary | ICD-10-CM | POA: Diagnosis present

## 2011-08-27 DIAGNOSIS — R296 Repeated falls: Secondary | ICD-10-CM | POA: Diagnosis not present

## 2011-08-27 DIAGNOSIS — N390 Urinary tract infection, site not specified: Secondary | ICD-10-CM | POA: Diagnosis present

## 2011-08-27 DIAGNOSIS — Z95810 Presence of automatic (implantable) cardiac defibrillator: Secondary | ICD-10-CM

## 2011-08-27 DIAGNOSIS — I4729 Other ventricular tachycardia: Secondary | ICD-10-CM | POA: Diagnosis present

## 2011-08-27 DIAGNOSIS — I447 Left bundle-branch block, unspecified: Secondary | ICD-10-CM

## 2011-08-27 DIAGNOSIS — K222 Esophageal obstruction: Secondary | ICD-10-CM | POA: Diagnosis present

## 2011-08-27 DIAGNOSIS — D649 Anemia, unspecified: Secondary | ICD-10-CM | POA: Diagnosis present

## 2011-08-27 DIAGNOSIS — K219 Gastro-esophageal reflux disease without esophagitis: Secondary | ICD-10-CM | POA: Diagnosis present

## 2011-08-27 DIAGNOSIS — E871 Hypo-osmolality and hyponatremia: Secondary | ICD-10-CM | POA: Diagnosis present

## 2011-08-27 DIAGNOSIS — I2690 Septic pulmonary embolism without acute cor pulmonale: Secondary | ICD-10-CM

## 2011-08-27 DIAGNOSIS — Z951 Presence of aortocoronary bypass graft: Secondary | ICD-10-CM

## 2011-08-27 DIAGNOSIS — G4700 Insomnia, unspecified: Secondary | ICD-10-CM | POA: Diagnosis present

## 2011-08-27 DIAGNOSIS — D696 Thrombocytopenia, unspecified: Secondary | ICD-10-CM | POA: Diagnosis present

## 2011-08-27 DIAGNOSIS — I2510 Atherosclerotic heart disease of native coronary artery without angina pectoris: Secondary | ICD-10-CM

## 2011-08-27 DIAGNOSIS — B376 Candidal endocarditis: Secondary | ICD-10-CM | POA: Diagnosis present

## 2011-08-27 DIAGNOSIS — R5381 Other malaise: Secondary | ICD-10-CM | POA: Diagnosis present

## 2011-08-27 DIAGNOSIS — I824Y9 Acute embolism and thrombosis of unspecified deep veins of unspecified proximal lower extremity: Secondary | ICD-10-CM | POA: Diagnosis not present

## 2011-08-27 HISTORY — DX: Candidal endocarditis: B37.6

## 2011-08-27 LAB — COMPREHENSIVE METABOLIC PANEL
ALT: 15 U/L (ref 0–53)
Alkaline Phosphatase: 97 U/L (ref 39–117)
CO2: 27 mEq/L (ref 19–32)
Creatinine, Ser: 0.71 mg/dL (ref 0.50–1.35)
GFR calc Af Amer: >90 mL/min (ref >90)
GFR calc non Af Amer: >90 mL/min (ref >90)
Glucose, Bld: 109 mg/dL — ABNORMAL HIGH (ref 70–99)
Potassium: 4.5 mEq/L (ref 3.5–5.1)
Sodium: 129 mEq/L — ABNORMAL LOW (ref 135–145)
Total Bilirubin: 0.4 mg/dL (ref 0.3–1.2)
Total Protein: 7.9 g/dL (ref 6.0–8.3)

## 2011-08-27 LAB — PROTIME-INR: INR: 1.32 (ref 0.00–1.49)

## 2011-08-27 LAB — DIFFERENTIAL
Basophils Absolute: 0.1 10*3/uL (ref 0.0–0.1)
Basophils Relative: 0 % (ref 0–1)
Eosinophils Absolute: 0.1 10*3/uL (ref 0.0–0.7)
Eosinophils Relative: 1 % (ref 0–5)
Monocytes Relative: 7 % (ref 3–12)
Neutro Abs: 9.9 10*3/uL — ABNORMAL HIGH (ref 1.7–7.7)
Neutrophils Relative %: 77 % (ref 43–77)

## 2011-08-27 LAB — MISCELLANEOUS TEST

## 2011-08-27 LAB — CBC
MCHC: 31.8 g/dL (ref 30.0–36.0)
MCV: 80.1 fL (ref 78.0–100.0)
RDW: 15.8 % — ABNORMAL HIGH (ref 11.5–15.5)

## 2011-08-27 LAB — APTT: aPTT: 38 seconds — ABNORMAL HIGH (ref 24–37)

## 2011-08-27 MED ORDER — SODIUM CHLORIDE 0.9 % IV SOLN: 250.0000 mL | INTRAVENOUS | Status: DC | PRN

## 2011-08-27 MED ORDER — ENSURE COMPLETE PO LIQD
237.0000 mL | Freq: Two times a day (BID) | ORAL | Status: DC
  Administered 2011-08-28 – 2011-09-09 (×21): 237 mL via ORAL

## 2011-08-27 MED ORDER — PANTOPRAZOLE SODIUM 40 MG PO TBEC
40.0000 mg | DELAYED_RELEASE_TABLET | Freq: Every day | ORAL | Status: DC
  Administered 2011-08-28 – 2011-09-06 (×10): 40 mg via ORAL
  Filled 2011-08-27 (×10): qty 1

## 2011-08-27 MED ORDER — THERA VITAL M PO TABS: 1.0000 | ORAL_TABLET | Freq: Every day | ORAL | Status: DC

## 2011-08-27 MED ORDER — SODIUM CHLORIDE 0.9 % IJ SOLN: 3.0000 mL | Freq: Two times a day (BID) | INTRAMUSCULAR | Status: DC

## 2011-08-27 MED ORDER — METOPROLOL TARTRATE 25 MG PO TABS
25.0000 mg | ORAL_TABLET | Freq: Two times a day (BID) | ORAL | Status: DC
  Administered 2011-08-27 – 2011-08-30 (×6): 25 mg via ORAL
  Filled 2011-08-27 (×7): qty 1

## 2011-08-27 MED ORDER — ASPIRIN EC 81 MG PO TBEC
81.0000 mg | DELAYED_RELEASE_TABLET | Freq: Every day | ORAL | Status: DC
  Administered 2011-08-28 – 2011-09-14 (×18): 81 mg via ORAL
  Filled 2011-08-27 (×18): qty 1

## 2011-08-27 MED ORDER — FUROSEMIDE 40 MG PO TABS
40.0000 mg | ORAL_TABLET | Freq: Every day | ORAL | Status: DC
  Administered 2011-08-28 – 2011-08-30 (×3): 40 mg via ORAL
  Filled 2011-08-27 (×3): qty 1

## 2011-08-27 MED ORDER — ADULT MULTIVITAMIN W/MINERALS CH
1.0000 | ORAL_TABLET | Freq: Every day | ORAL | Status: DC
  Administered 2011-08-28 – 2011-09-05 (×9): 1 via ORAL
  Filled 2011-08-27 (×9): qty 1

## 2011-08-27 MED ORDER — ACETAMINOPHEN 325 MG PO TABS
325.0000 mg | ORAL_TABLET | Freq: Four times a day (QID) | ORAL | Status: DC | PRN
  Administered 2011-08-31 – 2011-09-08 (×4): 650 mg via ORAL
  Filled 2011-08-27 (×5): qty 2

## 2011-08-27 MED ORDER — IOHEXOL 350 MG/ML SOLN
100.0000 mL | Freq: Once | INTRAVENOUS | Status: AC | PRN
  Administered 2011-08-27: 100 mL via INTRAVENOUS

## 2011-08-27 MED ORDER — CALCIUM CARBONATE ANTACID 500 MG PO CHEW
1.0000 | CHEWABLE_TABLET | Freq: Three times a day (TID) | ORAL | Status: DC
  Administered 2011-08-27 – 2011-09-05 (×23): 200 mg via ORAL
  Filled 2011-08-27 (×31): qty 1

## 2011-08-27 MED ORDER — SODIUM CHLORIDE 0.9 % IJ SOLN
3.0000 mL | Freq: Two times a day (BID) | INTRAMUSCULAR | Status: DC
  Administered 2011-08-28: 22:00:00 via INTRAVENOUS
  Administered 2011-09-01 – 2011-09-10 (×16): 3 mL via INTRAVENOUS
  Administered 2011-09-11: 10 mL via INTRAVENOUS
  Administered 2011-09-13 (×2): 3 mL via INTRAVENOUS

## 2011-08-27 MED ORDER — SODIUM CHLORIDE 0.9 % IV SOLN
150.0000 mg | INTRAVENOUS | Status: DC
  Administered 2011-08-27 – 2011-09-01 (×6): 150 mg via INTRAVENOUS
  Filled 2011-08-27 (×10): qty 150

## 2011-08-27 MED ORDER — VITAMIN C 100 MG PO TABS: 100.0000 mg | ORAL_TABLET | Freq: Every day | ORAL | Status: DC

## 2011-08-27 MED ORDER — POLYETHYLENE GLYCOL 3350 17 G PO PACK
17.0000 g | PACK | Freq: Every day | ORAL | Status: DC | PRN
  Filled 2011-08-27 (×2): qty 1

## 2011-08-27 MED ORDER — SODIUM CHLORIDE 0.9 % IJ SOLN: 3.0000 mL | INTRAMUSCULAR | Status: DC | PRN

## 2011-08-27 MED ORDER — CLONAZEPAM 0.5 MG PO TABS
0.5000 mg | ORAL_TABLET | Freq: Two times a day (BID) | ORAL | Status: DC | PRN
  Administered 2011-08-27 – 2011-09-05 (×10): 0.5 mg via ORAL
  Filled 2011-08-27 (×10): qty 1

## 2011-08-27 MED ORDER — VITAMIN C 250 MG PO TABS
250.0000 mg | ORAL_TABLET | Freq: Every day | ORAL | Status: DC
  Administered 2011-08-28 – 2011-09-05 (×9): 250 mg via ORAL
  Filled 2011-08-27 (×10): qty 1

## 2011-08-27 MED ORDER — OXYCODONE-ACETAMINOPHEN 5-325 MG PO TABS
1.0000 | ORAL_TABLET | Freq: Four times a day (QID) | ORAL | Status: DC | PRN
  Administered 2011-08-27 – 2011-09-03 (×16): 1 via ORAL
  Filled 2011-08-27 (×17): qty 1

## 2011-08-27 NOTE — Assessment & Plan Note (Addendum)
I am concerned that he may have evolution of pulmonary process where he had PE and infarction. Perhaps he has necrotic lung now? Could he have fungemia despite mycafungin (not unless his PICC is infected or his native valve is infected_) Could this PM wire clot have been polymicrobial? Possibly and would have preferred not antecedent ancef prior to extraction. Would get fresh set of blood cutlures from Two peripheral sites, FUngal blood culture also reasonable though not tech necessary for candida. C albicans likely S to azole but await Sensi data which will take another week or so since it is a send out. I would also repeat a CT angiogram to re-evalaute his lung parenchyma in the status of his pulmonary embolism. Would also consider [place] of pacemaker pocket given swelling here> I have not seen pt for some time so not clear signifcance of swelling. Likely hematoma but infection not excluded by my exam. Likely will need broadening of his antimicrobial therapy. Vancomycin, and Zosyn or vancomycin and cefepime and  metronidazole be reasonable combinations for coverage of MRSA, Pseudomonas, strep species and anaerobes after blood cultures drawn

## 2011-08-27 NOTE — Assessment & Plan Note (Signed)
Needs repeat eye exam

## 2011-08-27 NOTE — Assessment & Plan Note (Signed)
See above would get CT angio

## 2011-08-27 NOTE — Assessment & Plan Note (Signed)
See above discussion PM lead grew candida albicans. If he now has native valve endocarditis that is fungal he would require amphotericin and CVTS consult. More likely we are witnessing evolution of pulmonary process.

## 2011-08-27 NOTE — Assessment & Plan Note (Signed)
CHF could also be factoring into his tachypnea and malaise

## 2011-08-27 NOTE — Assessment & Plan Note (Signed)
See above discussion. He also need re-evaluation of his eyes by optho
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
## 2011-08-27 NOTE — Progress Notes (Signed)
Subjective:    Patient ID: [name], male    DOB: 07-26-1945, 66 y.o.   MRN: [id number]  HPI  66 year old man with complicated recent stay in the hospital. He had undergone an exhaustive workup for FUO by my partner Dr. Orvan Falconer before ultimately being found to have a large vegetation on pace maker lead. This was extracted by Dr. Ladona Ridgel with EP and leads sent for culture that yielded a few candida albicans (while pt was on micafungin). He deteriorated and became septic and was sent to MICU, improved with addition of broad spectrum antibiotics. He was given ancef prior to PM lead extraction by anethesiology then vancomycin and zosyn when he became septic. All cultures prior to the EP procedure and post procedure have failed to yield any other organisms. CT angiogram disclosed massive pulmonary embolism (presumably from embolized clot from pacer lead) with pulmonary infarction and other emboli. He was improving though still with intermittent fevers prior to DC on Saturday. He has continued to feel poorly after discharge. He has had subjective fevers and measured temperature to 101.3 yesterday. He is short of breath and with chest pain pleuritic 10/10 "worst in my life." He has not slept well in several nights. He has a feeling of dread as if he is not going to survive. I am very worried about him and have arranged for direct admission to telemetry. He is currently with stable blood pressure though with tachycardia to 120s and tachypnic. He has low grade temperature in clinc and is uncomfortable appearing. I am arranging for admission to the "b" service. I spent greater than 45 minutes with the patient including greater than 50% of time in face to face counsel of the patient and in coordination of their care.   Review of Systems  Constitutional: Positive for fever, chills, diaphoresis, activity change, appetite change, fatigue and unexpected weight change.  HENT: Negative for congestion, sore  throat, rhinorrhea, sneezing, trouble swallowing and sinus pressure.   Eyes: Negative for photophobia and visual disturbance.  Respiratory: Positive for cough and shortness of breath. Negative for chest tightness and wheezing.   Cardiovascular: Positive for chest pain and palpitations. Negative for leg swelling.  Gastrointestinal: Negative for nausea, vomiting, abdominal pain, diarrhea, constipation, blood in stool, abdominal distention and anal bleeding.  Genitourinary: Negative for dysuria, hematuria, flank pain and difficulty urinating.  Musculoskeletal: Negative for myalgias, back pain, joint swelling, arthralgias and gait problem.  Skin: Positive for wound. Negative for color change, pallor and rash.  Neurological: Positive for dizziness. Negative for tremors, weakness and light-headedness.  Hematological: Negative for adenopathy. Does not bruise/bleed easily.  Psychiatric/Behavioral: Negative for behavioral problems, confusion, sleep disturbance, dysphoric mood, decreased concentration and agitation.       Objective:   Physical Exam  Constitutional: He is oriented to person, place, and time. He appears well-developed and well-nourished. No distress.  HENT:  Head: Normocephalic and atraumatic.  Mouth/Throat: Oropharynx is clear and moist. No oropharyngeal exudate.  Eyes: Conjunctivae and EOM are normal. Pupils are equal, round, and reactive to light. No scleral icterus.  Neck: Normal range of motion. Neck supple. No JVD present.  Cardiovascular: Regular rhythm and normal heart sounds.  Tachycardia present.  Exam reveals no gallop and no friction rub.   No murmur heard. Pulmonary/Chest: Tachypnea noted. No respiratory distress. He has decreased breath sounds in the right lower field and the left lower field. He has no wheezes. He has no rales. He exhibits no tenderness.  Crackles at the bases  Abdominal: He exhibits no distension and no mass. There is no tenderness. There is no  rebound and no guarding.  Musculoskeletal: He exhibits no edema and no tenderness.  Lymphadenopathy:    He has no cervical adenopathy.  Neurological: He is alert and oriented to person, place, and time. He has normal reflexes. He exhibits normal muscle tone. Coordination normal.  Skin: Skin is warm and dry. He is not diaphoretic. No erythema. No pallor.     Psychiatric: He has a normal mood and affect. His behavior is normal. Judgment and thought content normal.          Assessment & Plan:  Fever I am concerned that he may have evolution of pulmonary process where he had PE and infarction. Perhaps he has necrotic lung now? Could he have fungemia despite mycafungin (not unless his PICC is infected or his native valve is infected_) Could this PM wire clot have been polymicrobial? Possibly and would have preferred not antecedent ancef prior to extraction. Would get fresh set of blood cutlures from Two peripheral sites, FUngal blood culture also reasonable though not tech necessary for candida. C albicans likely S to azole but await Sensi data which will take another week or so since it is a send out. I would also repeat a CT angiogram to re-evalaute his lung parenchyma in the status of his pulmonary embolism. Would also consider [place] of pacemaker pocket given swelling here> I have not seen pt for some time so not clear signifcance of swelling. Likely hematoma but infection not excluded by my exam. Likely will need broadening of his antimicrobial therapy. Vancomycin, and Zosyn or vancomycin and cefepime and  metronidazole be reasonable combinations for coverage of MRSA, Pseudomonas, strep species and anaerobes after blood cultures drawn  Endocarditis, infective, acute/subacute in other disease See above discussion PM lead grew candida albicans. If he now has native valve endocarditis that is fungal he would require amphotericin and CVTS consult. More likely we are witnessing evolution of pulmonary  process.  Candidemia See above discussion. He also need re-evaluation of his eyes by optho  Cotton wool spots Needs repeat eye exam  Septic embolism See above would get CT angio  CONGESTIVE HEART FAILURE, LEFT CHF could also be factoring into his tachypnea and malaise

## 2011-08-28 ENCOUNTER — Inpatient Hospital Stay (HOSPITAL_COMMUNITY): Payer: Medicare Other

## 2011-08-28 DIAGNOSIS — R0602 Shortness of breath: Secondary | ICD-10-CM

## 2011-08-28 DIAGNOSIS — R071 Chest pain on breathing: Secondary | ICD-10-CM

## 2011-08-28 DIAGNOSIS — J90 Pleural effusion, not elsewhere classified: Secondary | ICD-10-CM | POA: Diagnosis present

## 2011-08-28 DIAGNOSIS — R509 Fever, unspecified: Secondary | ICD-10-CM

## 2011-08-28 LAB — CBC
HCT: 28.3 % — ABNORMAL LOW (ref 39.0–52.0)
MCH: 25.6 pg — ABNORMAL LOW (ref 26.0–34.0)
MCHC: 31.8 g/dL (ref 30.0–36.0)
MCV: 80.6 fL (ref 78.0–100.0)
RDW: 15.9 % — ABNORMAL HIGH (ref 11.5–15.5)
WBC: 11.4 10*3/uL — ABNORMAL HIGH (ref 4.0–10.5)

## 2011-08-28 LAB — URINALYSIS, ROUTINE W REFLEX MICROSCOPIC
Bilirubin Urine: NEGATIVE
Hgb urine dipstick: NEGATIVE
Ketones, ur: NEGATIVE mg/dL
Leukocytes, UA: NEGATIVE
Nitrite: NEGATIVE
Protein, ur: NEGATIVE mg/dL
pH: 6 (ref 5.0–8.0)

## 2011-08-28 LAB — BODY FLUID CELL COUNT WITH DIFFERENTIAL
Eos, Fluid: 0 %
Neutrophil Count, Fluid: 40 % — ABNORMAL HIGH (ref 0–25)
Other Cells, Fluid: 0 %

## 2011-08-28 LAB — GLUCOSE, SEROUS FLUID: Glucose, Fluid: 136 mg/dL

## 2011-08-28 LAB — ALBUMIN, FLUID (OTHER): Albumin, Fluid: 2 g/dL

## 2011-08-28 LAB — BASIC METABOLIC PANEL
BUN: 12 mg/dL (ref 6–23)
Chloride: 92 mEq/L — ABNORMAL LOW (ref 96–112)
Creatinine, Ser: 0.67 mg/dL (ref 0.50–1.35)
GFR calc Af Amer: >90 mL/min (ref >90)
GFR calc non Af Amer: >90 mL/min (ref >90)
Glucose, Bld: 129 mg/dL — ABNORMAL HIGH (ref 70–99)

## 2011-08-28 LAB — PROTEIN, BODY FLUID: Total protein, fluid: 5.3 g/dL

## 2011-08-28 LAB — CULTURE, BLOOD (ROUTINE X 2)
Culture  Setup Time: 201305232356
Culture  Setup Time: 201305232355
Culture: NO GROWTH

## 2011-08-28 LAB — AMYLASE, BODY FLUID: Amylase, Fluid: 25 U/L

## 2011-08-28 LAB — LACTATE DEHYDROGENASE, PLEURAL OR PERITONEAL FLUID

## 2011-08-28 LAB — HEPATIC FUNCTION PANEL
Albumin: 2.1 g/dL — ABNORMAL LOW (ref 3.5–5.2)
Alkaline Phosphatase: 95 U/L (ref 39–117)
Indirect Bilirubin: 0.2 mg/dL — ABNORMAL LOW (ref 0.3–0.9)
Total Protein: 7.8 g/dL (ref 6.0–8.3)

## 2011-08-28 LAB — LACTATE DEHYDROGENASE: LDH: 291 U/L — ABNORMAL HIGH (ref 94–250)

## 2011-08-28 LAB — PH, BODY FLUID: pH, Fluid: 8.5

## 2011-08-28 MED ORDER — IBUPROFEN 600 MG PO TABS
600.0000 mg | ORAL_TABLET | Freq: Four times a day (QID) | ORAL | Status: DC
  Administered 2011-08-28 – 2011-08-31 (×13): 600 mg via ORAL
  Filled 2011-08-28 (×19): qty 1
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
## 2011-08-28 NOTE — Discharge Summary (Signed)
Agree with Dr. Gardiner Sleeper Discharge Summary.  The small bilateral pulmonary nodules seen on the April CT scan could represent small septic emboli in retrospect.  This should be kept in the differential, especially if they resolve upon the follow-up CT scan.

## 2011-08-28 NOTE — Clinical Documentation Improvement (Signed)
CHF DOCUMENTATION CLARIFICATION QUERY  THIS DOCUMENT IS NOT A PERMANENT PART OF THE MEDICAL RECORD  TO RESPOND TO THE THIS QUERY, FOLLOW THE INSTRUCTIONS BELOW:  1. If needed, update documentation for the patient's encounter via the notes activity.  2. Access this query again and click edit on the In [employer].  3. After updating, or not, click F2 to complete all highlighted (required) fields concerning your review. Select "additional documentation in the medical record" OR "no additional documentation provided".  4. Click Sign note button.  5. The deficiency will fall out of your In Basket *Please let us know if you are not able to complete this workflow by phone or e-mail (listed below).  Please update your documentation within the medical record to reflect your response to this query.                                                                                    08/28/11  Dear Dr.Esme Freund/ [facility],  In a better effort to capture your patient's severity of illness, reflect appropriate length of stay and utilization of resources, a review of the patient medical record has revealed the following indicators the diagnosis of Heart Failure.    Based on your clinical judgment, please clarify and document in a progress note and/or discharge summary the clinical condition associated with the following supporting information:  In responding to this query please exercise your independent judgment.  The fact that a query is asked, does not imply that any particular answer is desired or expected.  Possible Clinical Conditions?   Chronic Systolic Congestive Heart Failure Chronic Diastolic Congestive Heart Failure Chronic Systolic & Diastolic Congestive Heart Failure Acute Systolic Congestive Heart Failure Acute Diastolic Congestive Heart Failure Acute Systolic & Diastolic Congestive Heart Failure Acute on Chronic Systolic Congestive Heart Failure Acute on Chronic Diastolic Congestive  Heart Failure Acute on Chronic Systolic & Diastolic  Congestive Heart Failure Other Condition Cannot Clinically Determine  Supporting Information:  Risk Factors: Patient does have CHF per 5/29 progress notes.  Diagnostics: 5/28: proBNP:  1011.0 Treatment: 5/29:  Lasix 40mg  daily  Reviewed: additional documentation in the medical record  Thank You,  Marciano Sequin,  Clinical Documentation Specialist:  Pager: [phone]  Health Information Management [place]

## 2011-08-28 NOTE — H&P (Signed)
Internal Medicine Attending Admission Note Date: 08/28/2011  Patient name: [name] Medical record number: [id number] Date of birth: March 10, 1946 Age: 66 y.o. Gender: male  I saw and evaluated the patient. I reviewed the resident's note and I agree with the resident's findings and plan as documented in the resident's note.  Chief Complaint(s): Fevers, chest pain with coughing.  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 He states at home he's had fevers running intermittently to about 101 for which she's taken Tylenol. He continues to have shortness of breath. He states immediately upon discharge from the hospital last week he was doing well and tolerating physical therapy without difficulty. Only over the last couple of days he started to deteriorate clinically. When seen in the clinic he was noted to have a pulse in the 120s and appeared unwell. He was therefore readmitted to the hospital for further evaluation. [name] only complaint or concern at this time is the out-of-pocket costs that he would incur because of the IV Micofungin therapy. This caused significant anxiety. He is otherwise without new complaints.  Physical Exam - key components related to admission:  Filed Vitals:   08/28/11 0010 08/28/11 0012 08/28/11 0015 08/28/11 0500  BP: 123/77 104/66 102/70 130/79  Pulse: 93 101 101 107  Temp:    98.3 F (36.8 C)  TempSrc:    Oral  Resp: 20 20 20 20   SpO2: 95% 97% 96% 93%   Gen.: Well-developed, chronically ill-appearing, man lying comfortably in bed in no acute distress. He actually appears slightly improved from discharge in general appearance. Lungs:  Right-sided inspiratory crackles from the base to two thirds up the right hemithorax. The left lung is clear without wheezes, rhonchi, or rales. Heart: Tachycardic, regular rhythm, without murmurs, rubs, or gallops. Abdomen: Soft, nontender, active bowel sounds. Extremities: Without edema.  Lab results:  Basic Metabolic Panel:  Basename 08/28/11 0600 08/27/11 2100  NA 129* 129*  K 3.9 4.5  CL 92* 93*  CO2 26 27  GLUCOSE 129* 109*  BUN 12 12  CREATININE 0.67 0.71  CALCIUM 9.3 9.1  MG -- --  PHOS -- --   Liver Function Tests:  Basename 08/28/11 0600 08/27/11 2100  AST 26 27  ALT 13 15  ALKPHOS 95 97  BILITOT 0.3 0.4  PROT 7.8 7.9  ALBUMIN 2.1* 2.2*   CBC:  Basename 08/28/11 0600 08/27/11 2100  WBC 11.4* 12.9*  NEUTROABS -- 9.9*  HGB 9.0* 9.1*  HCT 28.3* 28.6*  MCV 80.6 80.1  PLT 245 264   Coagulation:  Basename 08/27/11 2100  INR 1.32   Imaging results:  Ct Angio Chest W/cm &/or Wo Cm  08/27/2011  *RADIOLOGY REPORT*  Clinical Data: 66 year old male with recent diagnosis of acute pulmonary embolus.  Shortness of breath, rapid respiratory rate, chest pain.  CT ANGIOGRAPHY CHEST  Technique:  Multidetector CT imaging of the chest using the standard protocol during bolus administration of intravenous contrast. Multiplanar reconstructed images including MIPs were obtained and reviewed to evaluate the vascular anatomy.  Contrast: OMNIPAQUE IOHEXOL 350 MG/ML SOLN  Comparison: 08/18/2011 and earlier.  Findings: Good contrast bolus timing in the pulmonary arterial tree.  The bilateral pulmonary emboli with bulky volume  of right pulmonary artery thrombus again noted.  No significant interval change.  No saddle embolus.  Left straightening of the interventricular cardiac septum on today's images.  Increased layering right pleural effusion.  No pericardial or left pleural effusion.  Stable small mediastinal lymph nodes.  Right PICC line in place.  Stable visualized upper  abdominal viscera.  Sequelae median sternotomy. Stable visualized osseous structures.  Right lower lobe airspace disease has not significantly changed. Right middle lobe occasional peribronchovascular nodularity is stable.  Decreased but not resolved left lower lobe superior segment airspace disease.  Left superior anterior chest wall hematoma containing some gas re- identified.  IMPRESSION: 1.  Bilateral pulmonary emboli with bulky right lower lobe thrombus has not significantly changed since 08/18/2011. 2.  Mildly increased right pleural effusion.  Stable extensive right lower lobe airspace disease.  Stable or minimally improved ventilation elsewhere. 3. Small anterior chest wall hematoma with trace gas re-identified.  Original Report Authenticated By: Harley Hallmark, M.D.   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 CT angiogram demonstrates no change in the right-sided pulmonary embolism and no new evidence of worsening pulmonary infarction or new pulmonary hemorrhage. He does have a slight increase in his right-sided pleural effusion. We continue to await speciation of the Candida as well as sensitivities. There is very little in the medical literature to help guide [place] in the treatment of a large candidal septic emboli at this point. Obviously survival depends upon extraction of the infected lead but there is little to guide what we do with the residual emboli short of treatment with anti-fungals.  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 We will followup on the results of the Candida speciation and sensitivities once completed. Currently we  can find no evidence or data to support the use of anticoagulation at this point. In fact, would like to avoid anticoagulation if possible given the concern of a massive pulmonary hemorrhage related to revascularization of the pulmonary parenchyma should we get resolution of this large septic emboli.  2) Enlarging right-sided pleural effusion: The cause of the enlargement of the right pleural effusion is unclear. It may still be related to the pulmonary infarct with hemorrhage or could be an unusual unilateral pleural effusion related to worsening heart failure given the sudden discontinuance of the ventricular synchronization therapy. We will continue to adjust his heart failure regimen and ask interventional radiology to perform a right sided thoracentesis to analyze this pleural effusion for characterization and culture (uncluding for fungi).  3) General debilitation: We will consult physical therapy to continue to work with Mr. Nall to assure he does not become further debilitated. We will ask them to assess his needs for skilled nursing home placement at least in the short-term to see if this is the best option for him from an ADL standpoint.

## 2011-08-28 NOTE — Procedures (Signed)
Successful US guided (R) thoracentesis. Yielded of blood tinged serous fluid. Pt tolerated procedure well. No immediate complications. Small amount fluid residual left due to lateral approach.  Specimen was sent for labs. CXR ordered.  Brayton El PA-C 08/28/2011 3:46 PM

## 2011-08-28 NOTE — Progress Notes (Signed)
INTERNAL MEDICINE DAILY PROGRESS NOTE  [name] MRN: [id number] DOB/AGE: 66-13-1947 66 y.o.  Admit date: 08/27/2011  Subjective: [name] states his SOB and chest pain are more controlled today. Pain is 5/10 and bearable, he states he does not want any additional pain medicines. He is having mild subjective fevers and non-productive cough.  Objective: Vital signs in last 24 hours: Filed Vitals:   08/28/11 0010 08/28/11 0012 08/28/11 0015 08/28/11 0500  BP: 123/77 104/66 102/70 130/79  Pulse: 93 101 101 107  Temp:    98.3 F (36.8 C)  TempSrc:    Oral  Resp: 20 20 20 20   SpO2: 95% 97% 96% 93%   Weight change:   Intake/Output Summary (Last 24 hours) at 08/28/11 1058 Last data filed at 08/28/11 0900  Gross per 24 hour  Intake    760 ml  Output    200 ml  Net    560 ml   Physical Exam: General: Usually sleeping flat in bed, resting comfortably  Cardiac: Tachycardic but regular no murmurs appreciated. Pulm: Bilateral pulmonary crackles right greater than left. Decreased breath sounds in the right lung base Abd: soft, nontender, nondistended, BS present Ext: warm and well perfused, no pedal edema  Lab Results: Basic Metabolic Panel:  Lab 08/28/11 4098 08/27/11 2100  NA 129* 129*  K 3.9 4.5  CL 92* 93*  CO2 26 27  GLUCOSE 129* 109*  BUN 12 12  CREATININE 0.67 0.71  CALCIUM 9.3 9.1  MG -- --  PHOS -- --   Liver Function Tests:  Lab 08/27/11 2100 08/22/11 1005  AST 27 21  ALT 15 13  ALKPHOS 97 80  BILITOT 0.4 0.3  PROT 7.9 6.6  ALBUMIN 2.2* 1.9*   CBC:  Lab 08/28/11 0600 08/27/11 2100  WBC 11.4* 12.9*  NEUTROABS -- 9.9*  HGB 9.0* 9.1*  HCT 28.3* 28.6*  MCV 80.6 80.1  PLT 245 264   BNP:  Lab 08/27/11 2100  PROBNP 1011.0*   Coagulation:  Lab 08/27/11 2100  LABPROT 16.6*  INR 1.32   Urinalysis:  Lab 08/28/11 0611  COLORURINE YELLOW  LABSPEC 1.031*  PHURINE 6.0  GLUCOSEU NEGATIVE  HGBUR NEGATIVE  BILIRUBINUR NEGATIVE   KETONESUR NEGATIVE  PROTEINUR NEGATIVE  UROBILINOGEN 0.2  NITRITE NEGATIVE  LEUKOCYTESUR NEGATIVE    Micro Results: Recent Results (from the past 240 hour(s))  CLOSTRIDIUM DIFFICILE BY PCR     Status: Normal   Collection Time   08/20/11 12:48 PM      Component Value Range Status Comment   C difficile by pcr NEGATIVE  NEGATIVE  Final   CULTURE, BLOOD (ROUTINE X 2)     Status: Normal (Preliminary result)   Collection Time   08/22/11  2:20 PM      Component Value Range Status Comment   Specimen Description BLOOD RIGHT HAND   Final    Special Requests     Final    Value: BOTTLES DRAWN AEROBIC AND ANAEROBIC AERO 10CC ANA 8CC   Culture  Setup Time [id number]   Final    Culture     Final    Value:        BLOOD CULTURE RECEIVED NO GROWTH TO DATE CULTURE WILL BE HELD FOR 5 DAYS BEFORE ISSUING A FINAL NEGATIVE REPORT   Report Status PENDING   Incomplete   CULTURE, BLOOD (ROUTINE X 2)     Status: Normal (Preliminary result)   Collection Time   08/22/11  3:15  PM      Component Value Range Status Comment   Specimen Description BLOOD HAND RIGHT   Final    Special Requests BOTTLES DRAWN AEROBIC ONLY 3CC   Final    Culture  Setup Time [id number]   Final    Culture     Final    Value:        BLOOD CULTURE RECEIVED NO GROWTH TO DATE CULTURE WILL BE HELD FOR 5 DAYS BEFORE ISSUING A FINAL NEGATIVE REPORT   Report Status PENDING   Incomplete   MRSA PCR SCREENING     Status: Normal   Collection Time   08/27/11  5:41 PM      Component Value Range Status Comment   MRSA by PCR NEGATIVE  NEGATIVE  Final    Studies/Results: Ct Angio Chest W/cm &/or Wo Cm  08/27/2011  *RADIOLOGY REPORT*  Clinical Data: 66 year old male with recent diagnosis of acute pulmonary embolus.  Shortness of breath, rapid respiratory rate, chest pain.  CT ANGIOGRAPHY CHEST  Technique:  Multidetector CT imaging of the chest using the standard protocol during bolus administration of intravenous contrast. Multiplanar  reconstructed images including MIPs were obtained and reviewed to evaluate the vascular anatomy.  Contrast: OMNIPAQUE IOHEXOL 350 MG/ML SOLN  Comparison: 08/18/2011 and earlier.  Findings: Good contrast bolus timing in the pulmonary arterial tree.  The bilateral pulmonary emboli with bulky volume of right pulmonary artery thrombus again noted.  No significant interval change.  No saddle embolus.  Left straightening of the interventricular cardiac septum on today's images.  Increased layering right pleural effusion.  No pericardial or left pleural effusion.  Stable small mediastinal lymph nodes.  Right PICC line in place.  Stable visualized upper abdominal viscera.  Sequelae median sternotomy. Stable visualized osseous structures.  Right lower lobe airspace disease has not significantly changed. Right middle lobe occasional peribronchovascular nodularity is stable.  Decreased but not resolved left lower lobe superior segment airspace disease.  Left superior anterior chest wall hematoma containing some gas re- identified.  IMPRESSION: 1.  Bilateral pulmonary emboli with bulky right lower lobe thrombus has not significantly changed since 08/18/2011. 2.  Mildly increased right pleural effusion.  Stable extensive right lower lobe airspace disease.  Stable or minimally improved ventilation elsewhere. 3. Small anterior chest wall hematoma with trace gas re-identified.  Original Report Authenticated By: [name], M.D.   Medications: I have reviewed the patient's current medications. Scheduled Meds:   . aspirin EC  81 mg Oral Daily  . calcium carbonate  1 tablet Oral TID WC  . feeding supplement  237 mL Oral BID BM  . furosemide  40 mg Oral Daily  . metoprolol  25 mg Oral BID  . micafungin (MYCAMINE) IV  150 mg Intravenous Q24H  . mulitivitamin with minerals  1 tablet Oral Daily  . pantoprazole  40 mg Oral Q1200  . sodium chloride  3 mL Intravenous Q12H  . sodium chloride  3 mL Intravenous Q12H  .  vitamin C  250 mg Oral Daily  . DISCONTD: multivitamin  1 tablet Oral Q breakfast  . DISCONTD: vitamin C  100 mg Oral Daily   Continuous Infusions:  PRN Meds:.sodium chloride, acetaminophen, clonazePAM, iohexol, oxyCODONE-acetaminophen, polyethylene glycol, sodium chloride Assessment/Plan: Assessment & Plan by Problem: 66 y/o m with pmh of recent prolonged and complicated hospitalization for fungal infection of the pacer leads leading to septic pulmonary emboli and fungemia causing septic shock is admitted per Dr. Clinton Gallant from  clinic because of persistent low grade fevers and chest pain since discharge.  1. Pleuritic chest pain and sob/pulmonary embolism: CTA of chest with mildly increased right pleural effusion without significant changes from 08/18/2011 with persistent bilateral PE with RLL thrombus and RLL extensive airspace disease, and small chest hematoma with trace gas. Chest pain shortness of breath likely pleuritic in nature secondary to pleural effusion.  Could be secondary to empyema, hemothorax, pulmonary necrosis. Unlikely to be transudative in nature, though the patient does have CHF. Will obtain ultrasound guided diagnostic and therapeutic thoracentesis to evaluate pleural fluid. -- Ultrasound guided diagnostic and therapeutic thoracentesis -- Will send for AFB culture and stain fungal culture and stain, anaerobic culture and conventional fluid culture -- Will obtain pleural fluid cytology, cell count and differential, albumin, protein, LDH, pH -- Will treat with oxycodone-acetaminophen PRN and scheduled ibuprofen -- Will investigate if the literature supports anticoagulation for a non-thrombotic pulmonary embolism (septic)  2. Persistent Fever with increased leukocytosis- Differential diagnosis in this patient include persistent fungal infection that is gradually resolving, fungal infection resistant to mycofungin (unlikely), necrotic pulmonary process, parapneumonic effusion versus  empyema.  UTI was clean and cultures taken yesterday have not yet resulted. Abscess of the pacer pocket is a possibility, though less likely. Plan  - admit to telemetry given tachycardia  - monitor for fevers  - Will schedule ibuprofen - Follow blood cultures x2  - Continue mycofungin for total of 6 weeks per recent discharge plans  - will defer additional antibiotics pending blood culture results  - Will defer ultrasound of the pacer pocket until evaluation of pulmonary fluid complete  3. GERD/Candida Esophagitis - stable, will continue with continue micafungin and PPI.   4. CARDIOMYOPATHY, ISCHEMIC, CAD- EF 30% with mild LVH and diffuse hypokinesis, proBNP elevated but decreased from prior admission  - continue secondary prevention with aspirin, beta blocker in addition to daily lasix as noted above. Of Note pt has ACEI and ARB allergy.  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 Tachycardia- In setting of fever, anxiety, active infection and chest pain. Will not increase beta blocker at this time as he is are feeling fatigued and may be somewhat decompensated. -- Continue metoprolol at current dose  7. Anxiety- patient did not sleep very well last night. Will continue clonazepam and add trazodone for sleep.  8. Normocytic anemia - stable Hgb from recent hospitalization range 8-9, previously 14 in March 2013. His anemia panel demonstrated: Iron = 21, Sat ratio = 9, TIBC =243, Ferritin = 597, Retic count =1.9, FOBT negative. Peripheral smear revealed mild anemia with normal cell morphology. Findings consistent with Anemia of Chronic Disease given that TIBC is normal (usually elevated in Iron Deficiency Anemia) and ferritin high although could still be a component  of acute phase reactant.  -con to monitor CBC   9. Hyponatremia: Mild. Likely secondary to a CHF. -cont to montior   DVT prophylaxis - SCDs      LOS: 1 day   [facility], Lile Mccurley PGY-I, Internal Medicine Resident  Pager: [phone] (7AM-5PM) 08/28/2011, 10:58 AM
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
## 2011-08-29 ENCOUNTER — Inpatient Hospital Stay (HOSPITAL_COMMUNITY): Payer: Medicare Other

## 2011-08-29 LAB — CARDIAC PANEL(CRET KIN+CKTOT+MB+TROPI)
CK, MB: 1 ng/mL (ref 0.3–4.0)
CK, MB: 1.2 ng/mL (ref 0.3–4.0)
Total CK: 16 U/L (ref 7–232)
Total CK: 9 U/L (ref 7–232)
Troponin I: <0.3 ng/mL (ref <0.30)

## 2011-08-29 LAB — AMYLASE: Amylase: 49 U/L (ref 0–105)

## 2011-08-29 LAB — BASIC METABOLIC PANEL
BUN: 16 mg/dL (ref 6–23)
Calcium: 9.4 mg/dL (ref 8.4–10.5)
GFR calc non Af Amer: >90 mL/min (ref >90)
Glucose, Bld: 180 mg/dL — ABNORMAL HIGH (ref 70–99)
Sodium: 130 mEq/L — ABNORMAL LOW (ref 135–145)

## 2011-08-29 MED ORDER — ALBUTEROL SULFATE HFA 108 (90 BASE) MCG/ACT IN AERS
2.0000 | INHALATION_SPRAY | Freq: Four times a day (QID) | RESPIRATORY_TRACT | Status: DC
  Administered 2011-08-29 – 2011-09-05 (×24): 2 via RESPIRATORY_TRACT
  Filled 2011-08-29 (×3): qty 6.7

## 2011-08-29 MED ORDER — TRAZODONE HCL 50 MG PO TABS
50.0000 mg | ORAL_TABLET | Freq: Once | ORAL | Status: AC
  Administered 2011-08-29: 50 mg via ORAL
  Filled 2011-08-29 (×2): qty 1

## 2011-08-29 MED ORDER — SODIUM CHLORIDE 0.9 % IJ SOLN
10.0000 mL | INTRAMUSCULAR | Status: DC | PRN
  Administered 2011-08-29 – 2011-09-14 (×8): 10 mL

## 2011-08-29 MED ORDER — ENOXAPARIN SODIUM 40 MG/0.4ML ~~LOC~~ SOLN
40.0000 mg | SUBCUTANEOUS | Status: DC
  Administered 2011-08-29 – 2011-09-03 (×6): 40 mg via SUBCUTANEOUS
  Filled 2011-08-29 (×6): qty 0.4

## 2011-08-29 MED ORDER — FUROSEMIDE 10 MG/ML IJ SOLN
40.0000 mg | INTRAMUSCULAR | Status: AC
  Administered 2011-08-29: 40 mg via INTRAVENOUS
  Filled 2011-08-29: qty 4

## 2011-08-29 NOTE — Clinical Documentation Improvement (Signed)
MALNUTRITION DOCUMENTATION CLARIFICATION  THIS DOCUMENT IS NOT A PERMANENT PART OF THE MEDICAL RECORD  TO RESPOND TO THE THIS QUERY, FOLLOW THE INSTRUCTIONS BELOW:  1. If needed, update documentation for the patient's encounter via the notes activity.  2. Access this query again and click edit on the In [employer].  3. After updating, or not, click F2 to complete all highlighted (required) fields concerning your review. Select "additional documentation in the medical record" OR "no additional documentation provided".  4. Click Sign note button.  5. The deficiency will fall out of your In Basket *Please let us know if you are not able to complete this workflow by phone or e-mail (listed below).  Please update your documentation within the medical record to reflect your response to this query.                                                                                        08/29/11   Dear Dr.Klima / Associates,  In a better effort to capture your patient's severity of illness, reflect appropriate length of stay and utilization of resources, a review of the patient medical record has revealed the following indicators.    Based on your clinical judgment, please clarify and document in a progress note and/or discharge summary the clinical condition associated with the following supporting information:  In responding to this query please exercise your independent judgment.  The fact that a query is asked, does not imply that any particular answer is desired or expected.  Possible Clinical Conditions?  _______Moderate Malnutrition _______Severe Malnutrition   _______Protein Calorie Malnutrition _______Severe Protein Calorie Malnutrition   _______Other Condition _______Cannot clinically determine    Supporting Inormation: Risk Factors: Patient with 13% unintentional weight loss in less than 2 months related to inadequate oral intake. Meets criteria for moderate malnutrition in  the context of chronic illness.  Signs & Symptoms: Ht: 5'10"     Wt: 82kg  BMI: 26.1  Weight  Loss: approx. 27lb in less than 2months.  Treatment   Medications: Continue MVI daily; continue Ensure complete po BID.  Nutrition Consult: 08/28/11  You may use possible, probable, or suspect with inpatient documentation. possible, probable, suspected diagnoses MUST be documented at the time of discharge  Reviewed: additional documentation in the medical record  Thank You,  Marciano Sequin,  Clinical Documentation Specialist:  Pager: [phone]  Health Information Management [place]

## 2011-08-29 NOTE — Evaluation (Signed)
Physical Therapy Evaluation Patient Details Name: [name] MRN: [id number] DOB: 10/13/1945 Today's Date: 08/29/2011 Time: 4098-1191 PT Time Calculation (min): 24 min  PT Assessment / Plan / Recommendation Clinical Impression  pt admitted with pleuritic chest pain s/p thoracentisis.  Now feeling better, but very deconditioned with little tolerance for activity.  Will see on acute and by mobility team.  No follow up expected.    PT Assessment  Patient needs continued PT services    Follow Up Recommendations  Supervision for mobility/OOB;No PT follow up    Barriers to Discharge        lEquipment Recommendations  None recommended by PT    Recommendations for Other Services     Frequency      Precautions / Restrictions Precautions Precautions: Fall   Pertinent Vitals/Pain       Mobility  Bed Mobility Bed Mobility: Not assessed Supine to Sit: 6: Modified independent (Device/Increase time) Sitting - Scoot to Edge of Bed: 6: Modified independent (Device/Increase time) Transfers Transfers: Sit to Stand;Stand to Sit Sit to Stand: 5: Supervision;With upper extremity assist;From chair/3-in-1 Stand to Sit: 5: Supervision;With upper extremity assist;To chair/3-in-1 Details for Transfer Assistance: close by just for safety due to no stamina Ambulation/Gait Ambulation/Gait Assistance: 4: Min guard Ambulation Distance (Feet): 190 Feet Assistive device: Rolling walker Ambulation/Gait Assistance Details: steady gait with RW; pt was wary about walking without the RW due to weakness Gait Pattern: Step-through pattern;Decreased step length - right;Decreased step length - left;Decreased stride length    Exercises     PT Diagnosis: Difficulty walking (decr activity tolerance)  PT Problem List: Decreased activity tolerance;Cardiopulmonary status limiting activity PT Treatment Interventions: Gait training;DME instruction;Functional mobility training;Therapeutic  activities;Patient/family education   PT Goals Acute Rehab PT Goals PT Goal Formulation: With patient Time For Goal Achievement: 09/05/11 Potential to Achieve Goals: Good Pt will go Sit to Stand: Independently PT Goal: Sit to Stand - Progress: Goal set today Pt will Transfer Bed to Chair/Chair to Bed: Independently PT Transfer Goal: Bed to Chair/Chair to Bed - Progress: Goal set today Pt will Ambulate: >150 feet;with modified independence;with least restrictive assistive device PT Goal: Ambulate - Progress: Goal set today  Visit Information  Last PT Received On: 08/29/11 Assistance Needed: +1    Subjective Data  Subjective: you wouldn't have been able to do anything with me this morning Patient Stated Goal: Home I   Prior Functioning  Home Living Lives With: Spouse Available Help at Discharge: Family;Available 24 hours/day Type of Home: House Home Access: Ramped entrance Home Layout: One level Bathroom Shower/Tub: Health visitor: Standard Home Adaptive Equipment: Wheelchair - manual;Walker - rolling;Bedside commode/3-in-1 Additional Comments: ? walker Prior Function Level of Independence: Independent Driving: Yes Vocation: Works at home Communication Communication: No difficulties Dominant Hand: Right    Cognition  Overall Cognitive Status: Appears within functional limits for tasks assessed/performed Arousal/Alertness: Awake/alert Orientation Level: Appears intact for tasks assessed Behavior During Session: [facility] for tasks performed    Extremity/Trunk Assessment Right Upper Extremity Assessment RUE ROM/Strength/Tone: Within functional levels RUE Sensation: WFL - Light Touch RUE Coordination: WFL - gross/fine motor Left Upper Extremity Assessment LUE ROM/Strength/Tone: Deficits LUE ROM/Strength/Tone Deficits: Due to premorbid status. Pt got his hand caught in a garbage disposal and severed several tendons. Pt has little to no functional [place] of hand.  All other joints WFL. Strength WFL LUE Sensation: WFL - Light Touch LUE Coordination: WFL - gross/fine motor Right Lower Extremity Assessment RLE ROM/Strength/Tone: WFL for tasks  assessed RLE Coordination: WFL - gross/fine motor Left Lower Extremity Assessment LLE ROM/Strength/Tone: WFL for tasks assessed LLE Coordination: WFL - gross/fine motor Trunk Assessment Trunk Assessment: Normal   Balance Balance Balance Assessed: No Static Standing Balance Static Standing - Balance Support: Bilateral upper extremity supported;During functional activity Static Standing - Level of Assistance: 5: Stand by assistance  End of Session PT - End of Session Activity Tolerance: Patient tolerated treatment well Patient left: in chair;with call bell/phone within reach;with family/visitor present Nurse Communication: Mobility status   Shade Rivenbark, Eliseo Gum 08/29/2011, 5:27 PM  08/29/2011  [place], PT [phone] (pager)

## 2011-08-29 NOTE — Progress Notes (Signed)
INFECTIOUS DISEASE PROGRESS NOTE  ID: [name] is a 66 y.o. male with   Principal Problem:  *Pleural effusion Active Problems:  Chronic systolic congestive heart failure  Esophageal stricture  Candidal endocarditis  Subjective: Feels better this afternoon after rest and analgesic.   Abtx:  Anti-infectives     Start     Dose/Rate Route Frequency Ordered Stop   08/27/11 2200   micafungin (MYCAMINE) 150 mg in sodium chloride 0.9 % 100 mL IVPB        150 mg 100 mL/hr over 1 Hours Intravenous Every 24 hours 08/27/11 2031            Medications:  Scheduled:   . albuterol  2 puff Inhalation Q6H  . aspirin EC  81 mg Oral Daily  . calcium carbonate  1 tablet Oral TID WC  . enoxaparin (LOVENOX) injection  40 mg Subcutaneous Q24H  . feeding supplement  237 mL Oral BID BM  . furosemide  40 mg Intravenous STAT  . furosemide  40 mg Oral Daily  . ibuprofen  600 mg Oral QID  . metoprolol  25 mg Oral BID  . micafungin (MYCAMINE) IV  150 mg Intravenous Q24H  . mulitivitamin with minerals  1 tablet Oral Daily  . pantoprazole  40 mg Oral Q1200  . sodium chloride  3 mL Intravenous Q12H  . sodium chloride  3 mL Intravenous Q12H  . vitamin C  250 mg Oral Daily    Objective: Vital signs in last 24 hours: Temp:  [97.5 F (36.4 C)-100.1 F (37.8 C)] 97.6 F (36.4 C) (05/30 1426) Pulse Rate:  [60-125] 91  (05/30 1426) Resp:  [18-38] 18  (05/30 1426) BP: (98-132)/(47-80) 106/71 mmHg (05/30 1426) SpO2:  [96 %-100 %] 99 % (05/30 1426)   General appearance: alert, cooperative and no distress Resp: clear to auscultation bilaterally Chest wall: no tenderness, pacer pocket swollen, firm. Cardio: regular rate and rhythm GI: normal findings: bowel sounds normal and soft, non-tender  Lab Results  Basename 08/29/11 1105 08/28/11 0600 08/27/11 2100  WBC -- 11.4* 12.9*  HGB -- 9.0* 9.1*  HCT -- 28.3* 28.6*  NA 130* 129* --  K 5.0 3.9 --  CL 91* 92* --  CO2 27 26 --  BUN 16  12 --  CREATININE 0.75 0.67 --  GLU -- -- --   Liver Panel  Basename 08/28/11 0600 08/27/11 2100  PROT 7.8 7.9  ALBUMIN 2.1* 2.2*  AST 26 27  ALT 13 15  ALKPHOS 95 97  BILITOT 0.3 0.4  BILIDIR 0.1 --  IBILI 0.2* --   Sedimentation Rate No results found for this basename: ESRSEDRATE in the last 72 hours C-Reactive Protein No results found for this basename: CRP:2 in the last 72 hours  Microbiology: Recent Results (from the past 240 hour(s))  CLOSTRIDIUM DIFFICILE BY PCR     Status: Normal   Collection Time   08/20/11 12:48 PM      Component Value Range Status Comment   C difficile by pcr NEGATIVE  NEGATIVE  Final   CULTURE, BLOOD (ROUTINE X 2)     Status: Normal   Collection Time   08/22/11  2:20 PM      Component Value Range Status Comment   Specimen Description BLOOD RIGHT HAND   Final    Special Requests     Final    Value: BOTTLES DRAWN AEROBIC AND ANAEROBIC AERO 10CC ANA 8CC   Culture  Setup Time [id number]  Final    Culture NO GROWTH 5 DAYS   Final    Report Status 08/28/2011 FINAL   Final   CULTURE, BLOOD (ROUTINE X 2)     Status: Normal   Collection Time   08/22/11  3:15 PM      Component Value Range Status Comment   Specimen Description BLOOD HAND RIGHT   Final    Special Requests BOTTLES DRAWN AEROBIC ONLY 3CC   Final    Culture  Setup Time [id number]   Final    Culture NO GROWTH 5 DAYS   Final    Report Status 08/28/2011 FINAL   Final   MRSA PCR SCREENING     Status: Normal   Collection Time   08/27/11  5:41 PM      Component Value Range Status Comment   MRSA by PCR NEGATIVE  NEGATIVE  Final   CULTURE, BLOOD (ROUTINE X 2)     Status: Normal (Preliminary result)   Collection Time   08/27/11  9:53 PM      Component Value Range Status Comment   Specimen Description BLOOD LEFT FOOT   Final    Special Requests BOTTLES DRAWN AEROBIC AND ANAEROBIC 5CC   Final    Culture  Setup Time [id number]   Final    Culture     Final    Value:        BLOOD CULTURE  RECEIVED NO GROWTH TO DATE CULTURE WILL BE HELD FOR 5 DAYS BEFORE ISSUING A FINAL NEGATIVE REPORT   Report Status PENDING   Incomplete   CULTURE, BLOOD (ROUTINE X 2)     Status: Normal (Preliminary result)   Collection Time   08/27/11  9:59 PM      Component Value Range Status Comment   Specimen Description BLOOD RIGHT FOOT   Final    Special Requests BOTTLES DRAWN AEROBIC AND ANAEROBIC 5CC   Final    Culture  Setup Time [id number]   Final    Culture     Final    Value:        BLOOD CULTURE RECEIVED NO GROWTH TO DATE CULTURE WILL BE HELD FOR 5 DAYS BEFORE ISSUING A FINAL NEGATIVE REPORT   Report Status PENDING   Incomplete   BODY FLUID CULTURE     Status: Normal (Preliminary result)   Collection Time   08/28/11  3:54 PM      Component Value Range Status Comment   Specimen Description PLEURAL FLUID RIGHT   Final    Special Requests SYR   Final    Gram Stain     Final    Value: RARE WBC PRESENT,BOTH PMN AND MONONUCLEAR     NO ORGANISMS SEEN   Culture PENDING   Incomplete    Report Status PENDING   Incomplete   ANAEROBIC CULTURE     Status: Normal (Preliminary result)   Collection Time   08/28/11  3:55 PM      Component Value Range Status Comment   Specimen Description PLEURAL FLUID RIGHT   Final    Special Requests SYR   Final    Gram Stain     Final    Value: RARE WBC PRESENT,BOTH PMN AND MONONUCLEAR     NO ORGANISMS SEEN   Culture     Final    Value: NO ANAEROBES ISOLATED; CULTURE IN PROGRESS FOR 5 DAYS   Report Status PENDING   Incomplete     Studies/Results: Dg Chest 1  View  08/28/2011  *RADIOLOGY REPORT*  Clinical Data: 66 year old male with pulmonary emboli.  Status post thoracentesis.  CHEST - 1 VIEW  Comparison: 08/27/2011 and earlier.  Findings: Stable right PICC line.  Stable cardiac size and mediastinal contours.  No pneumothorax.  Small residual right pleural effusion with superimposed patchy and confluent airspace disease at the right lung base.  Streaky  opacity at the left base. Stable lung apices. Visualized tracheal air column is within normal limits.  IMPRESSION: No pneumothorax following thoracentesis.  Small right pleural effusion and right greater than left airspace disease as the sequelae of pulmonary emboli.  Original Report Authenticated By: Harley Hallmark, M.D.   Ct Angio Chest W/cm &/or Wo Cm  08/27/2011  *RADIOLOGY REPORT*  Clinical Data: 66 year old male with recent diagnosis of acute pulmonary embolus.  Shortness of breath, rapid respiratory rate, chest pain.  CT ANGIOGRAPHY CHEST  Technique:  Multidetector CT imaging of the chest using the standard protocol during bolus administration of intravenous contrast. Multiplanar reconstructed images including MIPs were obtained and reviewed to evaluate the vascular anatomy.  Contrast: OMNIPAQUE IOHEXOL 350 MG/ML SOLN  Comparison: 08/18/2011 and earlier.  Findings: Good contrast bolus timing in the pulmonary arterial tree.  The bilateral pulmonary emboli with bulky volume of right pulmonary artery thrombus again noted.  No significant interval change.  No saddle embolus.  Left straightening of the interventricular cardiac septum on today's images.  Increased layering right pleural effusion.  No pericardial or left pleural effusion.  Stable small mediastinal lymph nodes.  Right PICC line in place.  Stable visualized upper abdominal viscera.  Sequelae median sternotomy. Stable visualized osseous structures.  Right lower lobe airspace disease has not significantly changed. Right middle lobe occasional peribronchovascular nodularity is stable.  Decreased but not resolved left lower lobe superior segment airspace disease.  Left superior anterior chest wall hematoma containing some gas re- identified.  IMPRESSION: 1.  Bilateral pulmonary emboli with bulky right lower lobe thrombus has not significantly changed since 08/18/2011. 2.  Mildly increased right pleural effusion.  Stable extensive right lower lobe  airspace disease.  Stable or minimally improved ventilation elsewhere. 3. Small anterior chest wall hematoma with trace gas re-identified.  Original Report Authenticated By: Harley Hallmark, M.D.   Dg Chest Port 1 View  08/29/2011  *RADIOLOGY REPORT*  Clinical Data: Cough, shortness of breath.  PORTABLE CHEST - 1 VIEW  Comparison: 08/28/2011  Findings: Right PICC line remains in place with the tip in the right atrium, unchanged.  Prior CABG.  Consolidation in the right lower lobe, unchanged.  No focal opacity on the left.  Heart is mildly enlarged.  IMPRESSION: Continued right lower lobe consolidation.  Improving opacities in the left lung.  Mild cardiomegaly.  Original Report Authenticated By: Cyndie Chime, M.D.   US Thoracentesis Asp Pleural Space W/img Guide  08/28/2011  *RADIOLOGY REPORT*  Clinical Data:  Patient with pleuritic chest pain, recent finding of pulmonary embolus, right-sided pleural effusion  ULTRASOUND GUIDED right THORACENTESIS  Comparison:  None  An ultrasound guided thoracentesis was thoroughly discussed with the patient and questions answered.  The benefits, risks, alternatives and complications were also discussed.  The patient understands and wishes to proceed with the procedure.  Written consent was obtained.  Ultrasound was performed to localize and mark an adequate pocket of fluid in the right chest.  The area was then prepped and draped in the normal sterile fashion.  1% Lidocaine was used for local anesthesia.  Under  ultrasound guidance a 19 gauge Yueh catheter was introduced.  Thoracentesis was performed.  The catheter was removed and a dressing applied.  Complications:  The patient had mild vasovagal response post procedure with a slight drop in his blood pressure to 98/72. He recovered well after and had no loss of consciousness. BP was then back to baseline at 119/72  Findings: A total of approximately 240 ml of blood tinged serous fluid was removed. A fluid sample was sent  for laboratory analysis.  IMPRESSION: Successful ultrasound guided right thoracentesis yielding 240 ml of pleural fluid.  Read by Brayton El PA-C  Original Report Authenticated By: Reola Calkins, M.D.     Assessment/Plan: Pulmonary Infarct/septic emboli  Fungemia  Would-  Continue micafungin (Day 18 antifungal therapy)  Await sensi of yeast (sent reference lab in [place])  Await studies from thoracentesis Consider ultrasound,  surgical eval of pacer pocket site (for I and D).        Broaden anbx as indicated from I & D   Johny Sax Infectious Diseases [phone] 08/29/2011, 2:38 PM   LOS: 2 days

## 2011-08-29 NOTE — Progress Notes (Signed)
PT Cancellation Note  Treatment cancelled today due to patient's refusal to participate. Patient states he has no energy. States he could have ran up and down the hallway yesterday, but today he feels weak and SOB. Patient known to me from prior admission and will not stay in bed unless feeling unwell. We will check back tomorrow.  Edwyna Perfect, PT  Pager [phone]  08/29/2011, 9:16 AM

## 2011-08-29 NOTE — Progress Notes (Signed)
INTERNAL MEDICINE DAILY PROGRESS NOTE  [name] MRN: [id number] DOB/AGE: 12/29/1945 66 y.o.  Admit date: 08/27/2011  Subjective: [name] states he felt very well after receiving diagnostic and therapeutic thoracentesis yesterday. His shortness of breath was immediately relieved and he felt very good. However last night, he again developed shortness of breath that was "worse than when I came in." He also complains of being very chilled and exhausted. Denies chest pain though states it does hurt when he takes a deep breath in.  Objective: Vital signs in last 24 hours: Filed Vitals:   08/28/11 2100 08/28/11 2200 08/29/11 0500 08/29/11 0958  BP: 104/69 127/80 112/47 122/76  Pulse: 60 90 105 125  Temp: 98.7 F (37.1 C) 97.5 F (36.4 C) 98 F (36.7 C) 100.1 F (37.8 C)  TempSrc: Oral   Oral  Resp: 18 20 20  38  SpO2: 97% 100% 99% 100%   Weight change:   Intake/Output Summary (Last 24 hours) at 08/29/11 1036 Last data filed at 08/29/11 0900  Gross per 24 hour  Intake    240 ml  Output    200 ml  Net     40 ml   Physical Exam: General: resting in bed, appearing very tired, out of breath simply with conversation Cardiac: Regular tachycardia, no rubs, murmurs or gallops Pulm: Good air movement bilaterally, crackles present in the right lung fields. I can hear air movement bilaterally. Abd: soft, nontender, nondistended, BS present Ext: Cool but otherwise well perfused, no pedal edema Neuro: alert and oriented X3, cranial nerves II-XII grossly intact  Lab Results: Basic Metabolic Panel:  Lab 08/28/11 1478 08/27/11 2100  NA 129* 129*  K 3.9 4.5  CL 92* 93*  CO2 26 27  GLUCOSE 129* 109*  BUN 12 12  CREATININE 0.67 0.71  CALCIUM 9.3 9.1  MG -- --  PHOS -- --   Liver Function Tests:  Lab 08/28/11 0600 08/27/11 2100  AST 26 27  ALT 13 15  ALKPHOS 95 97  BILITOT 0.3 0.4  PROT 7.8 7.9  ALBUMIN 2.1* 2.2*   CBC:  Lab 08/28/11 0600 08/27/11 2100  WBC  11.4* 12.9*  NEUTROABS -- 9.9*  HGB 9.0* 9.1*  HCT 28.3* 28.6*  MCV 80.6 80.1  PLT 245 264   BNP:  Lab 08/27/11 2100  PROBNP 1011.0*   Coagulation:  Lab 08/27/11 2100  LABPROT 16.6*  INR 1.32   Urinalysis:  Lab 08/28/11 0611  COLORURINE YELLOW  LABSPEC 1.031*  PHURINE 6.0  GLUCOSEU NEGATIVE  HGBUR NEGATIVE  BILIRUBINUR NEGATIVE  KETONESUR NEGATIVE  PROTEINUR NEGATIVE  UROBILINOGEN 0.2  NITRITE NEGATIVE  LEUKOCYTESUR NEGATIVE    Micro Results: Recent Results (from the past 240 hour(s))  CLOSTRIDIUM DIFFICILE BY PCR     Status: Normal   Collection Time   08/20/11 12:48 PM      Component Value Range Status Comment   C difficile by pcr NEGATIVE  NEGATIVE  Final   CULTURE, BLOOD (ROUTINE X 2)     Status: Normal   Collection Time   08/22/11  2:20 PM      Component Value Range Status Comment   Specimen Description BLOOD RIGHT HAND   Final    Special Requests     Final    Value: BOTTLES DRAWN AEROBIC AND ANAEROBIC AERO 10CC ANA 8CC   Culture  Setup Time [id number]   Final    Culture NO GROWTH 5 DAYS   Final    Report  Status 08/28/2011 FINAL   Final   CULTURE, BLOOD (ROUTINE X 2)     Status: Normal   Collection Time   08/22/11  3:15 PM      Component Value Range Status Comment   Specimen Description BLOOD HAND RIGHT   Final    Special Requests BOTTLES DRAWN AEROBIC ONLY 3CC   Final    Culture  Setup Time [id number]   Final    Culture NO GROWTH 5 DAYS   Final    Report Status 08/28/2011 FINAL   Final   MRSA PCR SCREENING     Status: Normal   Collection Time   08/27/11  5:41 PM      Component Value Range Status Comment   MRSA by PCR NEGATIVE  NEGATIVE  Final   CULTURE, BLOOD (ROUTINE X 2)     Status: Normal (Preliminary result)   Collection Time   08/27/11  9:53 PM      Component Value Range Status Comment   Specimen Description BLOOD LEFT FOOT   Final    Special Requests BOTTLES DRAWN AEROBIC AND ANAEROBIC 5CC   Final    Culture  Setup Time [id number]    Final    Culture     Final    Value:        BLOOD CULTURE RECEIVED NO GROWTH TO DATE CULTURE WILL BE HELD FOR 5 DAYS BEFORE ISSUING A FINAL NEGATIVE REPORT   Report Status PENDING   Incomplete   CULTURE, BLOOD (ROUTINE X 2)     Status: Normal (Preliminary result)   Collection Time   08/27/11  9:59 PM      Component Value Range Status Comment   Specimen Description BLOOD RIGHT FOOT   Final    Special Requests BOTTLES DRAWN AEROBIC AND ANAEROBIC 5CC   Final    Culture  Setup Time [id number]   Final    Culture     Final    Value:        BLOOD CULTURE RECEIVED NO GROWTH TO DATE CULTURE WILL BE HELD FOR 5 DAYS BEFORE ISSUING A FINAL NEGATIVE REPORT   Report Status PENDING   Incomplete   BODY FLUID CULTURE     Status: Normal (Preliminary result)   Collection Time   08/28/11  3:54 PM      Component Value Range Status Comment   Specimen Description PLEURAL FLUID RIGHT   Final    Special Requests SYR   Final    Gram Stain     Final    Value: RARE WBC PRESENT,BOTH PMN AND MONONUCLEAR     NO ORGANISMS SEEN   Culture PENDING   Incomplete    Report Status PENDING   Incomplete   ANAEROBIC CULTURE     Status: Normal (Preliminary result)   Collection Time   08/28/11  3:55 PM      Component Value Range Status Comment   Specimen Description PLEURAL FLUID RIGHT   Final    Special Requests SYR   Final    Gram Stain     Final    Value: RARE WBC PRESENT,BOTH PMN AND MONONUCLEAR     NO ORGANISMS SEEN   Culture     Final    Value: NO ANAEROBES ISOLATED; CULTURE IN PROGRESS FOR 5 DAYS   Report Status PENDING   Incomplete    Studies/Results: Dg Chest 1 View  08/28/2011  *RADIOLOGY REPORT*  Clinical Data: 66 year old male with pulmonary emboli.  Status post  thoracentesis.  CHEST - 1 VIEW  Comparison: 08/27/2011 and earlier.  Findings: Stable right PICC line.  Stable cardiac size and mediastinal contours.  No pneumothorax.  Small residual right pleural effusion with superimposed patchy and  confluent airspace disease at the right lung base.  Streaky opacity at the left base. Stable lung apices. Visualized tracheal air column is within normal limits.  IMPRESSION: No pneumothorax following thoracentesis.  Small right pleural effusion and right greater than left airspace disease as the sequelae of pulmonary emboli.  Original Report Authenticated By: Harley Hallmark, M.D.   Ct Angio Chest W/cm &/or Wo Cm  08/27/2011  *RADIOLOGY REPORT*  Clinical Data: 66 year old male with recent diagnosis of acute pulmonary embolus.  Shortness of breath, rapid respiratory rate, chest pain.  CT ANGIOGRAPHY CHEST  Technique:  Multidetector CT imaging of the chest using the standard protocol during bolus administration of intravenous contrast. Multiplanar reconstructed images including MIPs were obtained and reviewed to evaluate the vascular anatomy.  Contrast: OMNIPAQUE IOHEXOL 350 MG/ML SOLN  Comparison: 08/18/2011 and earlier.  Findings: Good contrast bolus timing in the pulmonary arterial tree.  The bilateral pulmonary emboli with bulky volume of right pulmonary artery thrombus again noted.  No significant interval change.  No saddle embolus.  Left straightening of the interventricular cardiac septum on today's images.  Increased layering right pleural effusion.  No pericardial or left pleural effusion.  Stable small mediastinal lymph nodes.  Right PICC line in place.  Stable visualized upper abdominal viscera.  Sequelae median sternotomy. Stable visualized osseous structures.  Right lower lobe airspace disease has not significantly changed. Right middle lobe occasional peribronchovascular nodularity is stable.  Decreased but not resolved left lower lobe superior segment airspace disease.  Left superior anterior chest wall hematoma containing some gas re- identified.  IMPRESSION: 1.  Bilateral pulmonary emboli with bulky right lower lobe thrombus has not significantly changed since 08/18/2011. 2.  Mildly  increased right pleural effusion.  Stable extensive right lower lobe airspace disease.  Stable or minimally improved ventilation elsewhere. 3. Small anterior chest wall hematoma with trace gas re-identified.  Original Report Authenticated By: Harley Hallmark, M.D.   Dg Chest Port 1 View  08/29/2011  *RADIOLOGY REPORT*  Clinical Data: Cough, shortness of breath.  PORTABLE CHEST - 1 VIEW  Comparison: 08/28/2011  Findings: Right PICC line remains in place with the tip in the right atrium, unchanged.  Prior CABG.  Consolidation in the right lower lobe, unchanged.  No focal opacity on the left.  Heart is mildly enlarged.  IMPRESSION: Continued right lower lobe consolidation.  Improving opacities in the left lung.  Mild cardiomegaly.  Original Report Authenticated By: Cyndie Chime, M.D.   US Thoracentesis Asp Pleural Space W/img Guide  08/28/2011  *RADIOLOGY REPORT*  Clinical Data:  Patient with pleuritic chest pain, recent finding of pulmonary embolus, right-sided pleural effusion  ULTRASOUND GUIDED right THORACENTESIS  Comparison:  None  An ultrasound guided thoracentesis was thoroughly discussed with the patient and questions answered.  The benefits, risks, alternatives and complications were also discussed.  The patient understands and wishes to proceed with the procedure.  Written consent was obtained.  Ultrasound was performed to localize and mark an adequate pocket of fluid in the right chest.  The area was then prepped and draped in the normal sterile fashion.  1% Lidocaine was used for local anesthesia.  Under ultrasound guidance a 19 gauge Yueh catheter was introduced.  Thoracentesis was performed.  The catheter was  removed and a dressing applied.  Complications:  The patient had mild vasovagal response post procedure with a slight drop in his blood pressure to 98/72. He recovered well after and had no loss of consciousness. BP was then back to baseline at 119/72  Findings: A total of approximately 240 ml  of blood tinged serous fluid was removed. A fluid sample was sent for laboratory analysis.  IMPRESSION: Successful ultrasound guided right thoracentesis yielding 240 ml of pleural fluid.  Read by Brayton El PA-C  Original Report Authenticated By: Reola Calkins, M.D.   Medications: I have reviewed the patient's current medications. Scheduled Meds:    . albuterol  2 puff Inhalation Q6H  . aspirin EC  81 mg Oral Daily  . calcium carbonate  1 tablet Oral TID WC  . enoxaparin (LOVENOX) injection  40 mg Subcutaneous Q24H  . feeding supplement  237 mL Oral BID BM  . furosemide  40 mg Intravenous STAT  . furosemide  40 mg Oral Daily  . ibuprofen  600 mg Oral QID  . metoprolol  25 mg Oral BID  . micafungin (MYCAMINE) IV  150 mg Intravenous Q24H  . mulitivitamin with minerals  1 tablet Oral Daily  . pantoprazole  40 mg Oral Q1200  . sodium chloride  3 mL Intravenous Q12H  . sodium chloride  3 mL Intravenous Q12H  . vitamin C  250 mg Oral Daily   Continuous Infusions:  PRN Meds:.sodium chloride, acetaminophen, clonazePAM, oxyCODONE-acetaminophen, polyethylene glycol, sodium chloride Assessment/Plan: Assessment & Plan by Problem: 66 y/o m with pmh of recent prolonged and complicated hospitalization for fungal infection of the pacer leads leading to septic pulmonary emboli and fungemia causing septic shock is was admitted for chest pain and dyspnea is secondary to pleural effusion. Received diagnostic and therapeutic thoracentesis 08/28/2011. Now with increased dyspnea post procedure day 1.  1. Pleuritic chest pain and sob/pulmonary embolism: CTA of chest with mildly increased right pleural effusion without significant changes from 08/18/2011 with persistent bilateral PE with RLL thrombus and RLL extensive airspace disease, and small chest hematoma with trace gas. Chest pain shortness of breath likely pleuritic in nature secondary to pleural effusion. 08/28/2011 received ultrasound guided  diagnostic and therapeutic thoracentesis to evaluate pleural fluid. Initial tap was blood tinged fluid. This is most consistent with pulmonary infarction. The initial results show a clearly exudative effusion with fluid LDH> serum LDH. Thus far no organism seen on smears or culture. Initial concern today was for pneumothorax versus hemothorax post procedure. Portable chest x-ray obtained today did not show evidence of either pneumothorax or hemothorax. There was not increased consolidation from yesterday that would be consistent with reexpansion pulmonary edema. No new obvious pneumonia. Overall increase shortness of breath likely secondary to combination of low functional reserve, incomplete expansion of right lung post procedure, and possible CHF. We will give an additional dose of furosemide today and start scheduled albuterol nebs to recruit as many alveoli as possible.  -- Follow AFB culture an stain fungal culture and stain, anaerobic culture and conventional fluid culture -- Will treat with oxycodone-acetaminophen PRN and scheduled ibuprofen -- will not anticoagulate at therapeutic doses due to risk of hemorrhage and the emboli are not thromboembolic in nature.  -- will give additional dose of furosemide today 40 mg IV x once -- Will give 2 puffs albuterol QID  2. Persistent Fever with increased leukocytosis- Differential diagnosis in this patient include persistent fungal infection that is gradually resolving, fungal infection resistant to  mycofungin (unlikely), necrotic pulmonary process, parapneumonic effusion versus empyema.  UTI was clean and cultures taken yesterday have not yet resulted. Abscess of the pacer pocket is a possibility, though less likely. Blood cultures negative to date. 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 Will plan on continuing micafungin for now. - monitor for fevers  - Will schedule ibuprofen - Follow blood cultures x2  - Continue mycofungin for total of 6  weeks per recent discharge plans  - will defer additional antibiotics pending blood culture results  - Will defer ultrasound of the pacer pocket until evaluation of pulmonary fluid complete  3. GERD/Candida Esophagitis - stable, will continue with continue micafungin and PPI.   4. 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 Of Note pt has ACEI and ARB allergy.  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 Tachycardia- In setting of fever, anxiety, active infection and chest pain. Will not increase beta blocker at this time as he is are feeling fatigued and may be somewhat decompensated. -- Continue metoprolol at current dose  7. Anxiety- patient did not sleep very well last night. Will continue clonazepam and add trazodone for sleep. Patient is to maintain good sleep hygine today and not nap during the day.   8. Normocytic anemia - stable Hgb from recent hospitalization range 8-9, previously 14 in March 2013. His anemia panel demonstrated: Iron = 21, Sat ratio = 9, TIBC =243, Ferritin = 597, Retic count =1.9, FOBT negative. Peripheral smear revealed mild anemia with normal cell morphology. Findings consistent with Anemia of Chronic Disease given that TIBC is normal (usually elevated in Iron Deficiency Anemia) and ferritin high although could still be a component of acute phase reactant.  -con to monitor CBC   9. Hyponatremia: Mild. Likely secondary to a CHF. -cont to montior   DVT  prophylaxis - lovenox 40       LOS: 2 days   [facility], [name] PGY-I, Internal Medicine Resident  Pager: [phone] (7AM-5PM) 08/29/2011, 10:36 AM

## 2011-08-29 NOTE — Evaluation (Signed)
Occupational Therapy Evaluation Patient Details Name: [name] MRN: [id number] DOB: [birth year] Today's Date: 08/29/2011 Time: 2841-3244 OT Time Calculation (min): 29 min  OT Assessment / Plan / Recommendation Clinical Impression  Pt is a 66 yo O2 dependent male recently discharged from the hospital who presents with a pleural effusion. Pt mobilizing well but limited by poor activity tolerance. Skilled OT recommended to maximize I w/BADLs to mod I level and reinforce energy conservation strategies in prep for safe d/c home with wife.    OT Assessment  Patient needs continued OT Services    Follow Up Recommendations  No OT follow up    Barriers to Discharge      Equipment Recommendations  None recommended by OT    Recommendations for Other Services    Frequency  Min 2X/week    Precautions / Restrictions Precautions Precautions: Fall   Pertinent Vitals/Pain     ADL  Grooming: Performed;Wash/dry hands;Supervision/safety Where Assessed - Grooming: Supported standing Upper Body Bathing: Simulated;Set up Where Assessed - Upper Body Bathing: Unsupported sitting Lower Body Bathing: Simulated;Minimal assistance Where Assessed - Lower Body Bathing: Supported sit to stand Upper Body Dressing: Simulated;Set up Where Assessed - Upper Body Dressing: Unsupported sitting Lower Body Dressing: Performed;Set up Where Assessed - Lower Body Dressing: Unsupported sitting (Pt crossed feet over opposite knee to don/doff socks.) Toilet Transfer: Performed;Supervision/safety Toilet Transfer Method: Sit to Barista: Regular height toilet Toileting - Clothing Manipulation and Hygiene: Simulated;Supervision/safety Where Assessed - Engineer, mining and Hygiene: Sit to stand from 3-in-1 or toilet Equipment Used: Rolling walker Transfers/Ambulation Related to ADLs: Pt ambulated to the bathroom with supervision. Fatigues very quickly on 1 L of O2. ADL  Comments: Educated on energy conservation strategies, provided handout.    OT Diagnosis: Generalized weakness  OT Problem List: Decreased activity tolerance;Cardiopulmonary status limiting activity;Impaired balance (sitting and/or standing);Impaired UE functional use;Decreased safety awareness OT Treatment Interventions: Self-care/ADL training;Therapeutic activities;Energy conservation;DME and/or AE instruction;Patient/family education;Balance training   OT Goals Acute Rehab OT Goals OT Goal Formulation: With patient/family Time For Goal Achievement: 09/12/11 Potential to Achieve Goals: Good ADL Goals Pt Will Perform Grooming: with modified independence;Standing at sink (X 3 tasks to improve standing activity tolerance.) ADL Goal: Grooming - Progress: Goal set today Pt Will Perform Lower Body Bathing: with modified independence;Sit to stand from chair;Sit to stand from bed ADL Goal: Lower Body Bathing - Progress: Goal set today Pt Will Perform Lower Body Dressing: with modified independence;Sit to stand from chair;Sit to stand from bed ADL Goal: Lower Body Dressing - Progress: Goal set today Pt Will Transfer to Toilet: with modified independence;Regular height toilet;Ambulation ADL Goal: Toilet Transfer - Progress: Goal set today Pt Will Perform Toileting - Clothing Manipulation: with modified independence;Standing ADL Goal: Toileting - Clothing Manipulation - Progress: Goal set today Pt Will Perform Toileting - Hygiene: with modified independence;Sit to stand from 3-in-1/toilet ADL Goal: Toileting - Hygiene - Progress: Goal set today Pt Will Perform Tub/Shower Transfer: Tub transfer;with modified independence;Ambulation ADL Goal: Tub/Shower Transfer - Progress: Goal set today Miscellaneous OT Goals Miscellaneous OT Goal #1: Pt will verbalize & demo 3 energy conservation strategies for successful ADL completion. OT Goal: Miscellaneous Goal #1 - Progress: Goal set today  Visit  Information  Last OT Received On: 08/29/11 Assistance Needed: +1    Subjective Data  Subjective: I couldn't have done this this morning. 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   Prior Functioning  Home Living Lives With: Spouse Available  Help at Discharge: Family;Available 24 hours/day Type of Home: House Home Access: Ramped entrance Home Layout: One level Bathroom Shower/Tub: Health visitor: Standard Home Adaptive Equipment: Wheelchair - manual;Walker - rolling;Bedside commode/3-in-1 Prior Function Level of Independence: Independent Driving: Yes Vocation: Works at home Communication Communication: No difficulties Dominant Hand: Right    Cognition  Overall Cognitive Status: Appears within functional limits for tasks assessed/performed Arousal/Alertness: Awake/alert Orientation Level: Appears intact for tasks assessed Behavior During Session: [facility] for tasks performed    Extremity/Trunk Assessment Right Upper Extremity Assessment RUE ROM/Strength/Tone: Within functional levels RUE Sensation: WFL - Light Touch RUE Coordination: WFL - gross/fine motor Left Upper Extremity Assessment LUE ROM/Strength/Tone: Deficits LUE ROM/Strength/Tone Deficits: Due to premorbid status. Pt got his hand caught in a garbage disposal and severed several tendons. Pt has little to no functional [place] of hand. All other joints WFL. Strength WFL LUE Sensation: WFL - Light Touch LUE Coordination: WFL - gross/fine motor   Mobility Bed Mobility Supine to Sit: 6: Modified independent (Device/Increase time) Sitting - Scoot to Edge of Bed: 6: Modified independent (Device/Increase time) Transfers Sit to Stand: 5: Supervision;With upper extremity assist;From bed;From toilet Stand to Sit: 5: Supervision;With upper extremity assist;To chair/3-in-1;To toilet;With armrests Details for Transfer Assistance: min VCs for hand placement, safe technique manipulating RW around bathroom.   Exercise     Balance Balance Balance Assessed: Yes Static Standing Balance Static Standing - Balance Support: Bilateral upper extremity supported;During functional activity Static Standing - Level of Assistance: 5: Stand by assistance  End of Session OT - End of Session Activity Tolerance: Patient limited by fatigue Patient left: in chair;with call bell/phone within reach;with family/visitor present   [name] A OTR/L [phone] 08/29/2011, 2:34 PM

## 2011-08-29 NOTE — Progress Notes (Signed)
Resident Co-sign Daily Note: I have seen the patient and reviewed the daily progress note by Lewie Chamber and discussed the care of the patient with them.  See my note from earlier today for the official record and for documentation of my findings, assessment, and plans.

## 2011-08-29 NOTE — Care Management Note (Addendum)
    Page 1 of 2   09/13/2011     3:20:44 PM   CARE MANAGEMENT NOTE 09/13/2011  Patient:  [name], [name]   Account Number:  [id number]  Date Initiated:  08/29/2011  Documentation initiated by:  GRAVES-BIGELOW,BRENDA  Subjective/Objective Assessment:   Pt admitted with new onset cp- r pleural effusion. S/p thoracentesis. Pt is active with [facility] for RN/ PT services. Pt is a little more weaker now than on admission.     Action/Plan:   PT has been consulted for disposition needs. CM will continue ot monitor for disposiiton needs. Will do a benefits check for IV medicaiton micafungin IV.   Anticipated DC Date:  09/12/2011   Anticipated DC Plan:  HOME W HOME HEALTH SERVICES      DC Planning Services  CM consult      [facility] Choice  Resumption Of Svcs/PTA Provider   Choice offered to / List presented to:  C-1 Patient        HH arranged  HH-1 RN      [facility] agency  [facility]   Status of service:  Completed, signed off Medicare Important Message given?   (If response is "NO", the following Medicare IM given date fields will be blank) Date Medicare IM given:   Date Additional Medicare IM given:    Discharge Disposition:  HOME W HOME HEALTH SERVICES  Per UR Regulation:  Reviewed for med. necessity/level of care/duration of stay  If discussed at Long Length of Stay Meetings, dates discussed:   09/04/2011  09/11/2011    Comments:  09/13/11- 1500- Donn Pierini RN, BSN [phone] Pt will need home IV abx, has PICC- spoke with pt and wife at bedside and confirmed [facility] services resumption with [facility]- as pt had HH services with [facility] PTA-  prescription for IV abx in shadow chart for [facility]- MD to sign in am. Referral sent to [facility] for HH-RN for IV abx via [facility] and spoke with Hilda Lias with [facility] regarding HH needs. Pt will not need HH-PT at discharge per PT notes. Pt will also be fitted with a [employer] per cards prior to discharge. Morrie Sheldon with [employer] aware along with Tresa Endo from the cards  office. Pt is to be fitted either friday afternoon or Sat. morning.  6/12 debbie dowell rn,bsn has 30.00 copay for xarelto. has 4.00 copay for iv diflucan.  6/10 debbie dowell rn,bsn remains on iv antibiotics.  6/7 debbie dowell rn,bsn still not feeling great. wanted to speak w sw about notary. act w [facility] pta. cont to follow.  6/6 debbie dowell rn,bsn transf to critical care. cont iv antibiotics, some v tach.  6/3 debbie dowell rn,bsn [phone] being treated for pneumonia, iv lasix for shortness of breath.   08-30-11 [address], [place] [phone] CM  did speak to pt and did a benefits check co pay cost will be 33% of cost of med for pt. Per wife she had contacted insurance and they stated that for 4 day supply they would have to pay 708.00 and each week after that will be 318.49 for the rest of the 6 weeks. CM did speak to resident and they will discuss with the pt what the plan will be for disposition and med needs.  CM did call [facility] and spoke to liaison and she stated that Iv med caost from there pharmacy will be [phone]. CM will continue to monitor for assistance.

## 2011-08-29 NOTE — Progress Notes (Signed)
UR Completed Momin Misko Graves-Bigelow, RN,BSN [phone]  

## 2011-08-29 NOTE — Progress Notes (Signed)
Internal Medicine Attending  Date: 08/29/2011  Patient name: [name] Medical record number: [id number] Date of birth: July 02, 1945 Age: 66 y.o. Gender: male  I saw and evaluated the patient. I reviewed the resident's note by Dr. Candy Sledge and I agree with the resident's findings and plans as documented in his progress note.  [name] was seen on rounds this morning. He was much more dyspneic than yesterday getting short of breath simply by going from a seated position to a lying position in bed. Examination reveals continued right-sided inspiratory crackles. There were no wheezes or rhonchi. Chest x-ray was without evidence of a hemothorax or a pneumothorax post procedure. He did have evidence of a Hampton's hump and some consolidation without volume loss in the right base. He also complains of fatigue during the day that results in him napping and then the inability to sleep at night. I stressed the importance of trying to stay awake at all costs during the day to get his day-night sleep cycle back in check. I also agree with the housestaff's plan to give a dose of IV Lasix in case there is a mild fluid overload component to his dyspnea and to empirically start albuterol 2 puffs 4 times a day in hopes of treating any subclinical obstruction. Ultimately I think his dyspnea is also partially explained by his large fungal pulmonary embolus. If there's no improvement with basic medical care will consider inquiring about ablation of the fungal mass through some interventional radiology means if it is feasible. In the interim we will continue the micafungin awaiting for sensitivities.

## 2011-08-29 NOTE — Progress Notes (Signed)
Medical Student Daily Progress Note  Subjective: Patient received thoracentesis yesterday draining 240 mL. He felt extremely bad before the procedure and then very well afterwards. He then began feeling gradually worse overnight and into this morning. He feels weak, lethargic, and just overall "worn-out". He refused PT today due to his weakness. He also is having a hard time sleeping at night and is asking for something to help with sleep. Breathing also continues to remain an issue with him as well and he reports still not being able to breathe well with pain in his right side.  Objective: Vital signs in last 24 hours: Filed Vitals:   08/28/11 2100 08/28/11 2200 08/29/11 0500 08/29/11 0958  BP: 104/69 127/80 112/47 122/76  Pulse: 60 90 105 125  Temp: 98.7 F (37.1 C) 97.5 F (36.4 C) 98 F (36.7 C) 100.1 F (37.8 C)  TempSrc: Oral   Oral  Resp: 18 20 20  38  SpO2: 97% 100% 99% 100%   Weight change:   Intake/Output Summary (Last 24 hours) at 08/29/11 1053 Last data filed at 08/29/11 0900  Gross per 24 hour  Intake    240 ml  Output    200 ml  Net     40 ml   Physical Exam: BP 122/76  Pulse 125  Temp(Src) 100.1 F (37.8 C) (Oral)  Resp 38  SpO2 100% General appearance: alert, cooperative and fatigued Head: Normocephalic, without obvious abnormality, atraumatic Neck: no adenopathy, no carotid bruit, no JVD, supple, symmetrical, trachea midline and thyroid not enlarged, symmetric, no tenderness/mass/nodules Back: symmetric, no curvature. ROM normal. No CVA tenderness. Lungs: diminished breath sounds base - right and rales RLL and RML Chest wall: right sided chest wall tenderness Heart: regular rate and rhythm, S1, S2 normal, no murmur, click, rub or gallop Abdomen: soft, non-tender; bowel sounds normal; no masses,  no organomegaly Extremities: extremities normal, atraumatic, no cyanosis or edema Skin: Skin color, texture, turgor normal. No rashes or lesions Lab  Results: Basic Metabolic Panel:  Lab 08/28/11 1610 08/27/11 2100  NA 129* 129*  K 3.9 4.5  CL 92* 93*  CO2 26 27  GLUCOSE 129* 109*  BUN 12 12  CREATININE 0.67 0.71  CALCIUM 9.3 9.1  MG -- --  PHOS -- --   Liver Function Tests:  Lab 08/28/11 0600 08/27/11 2100  AST 26 27  ALT 13 15  ALKPHOS 95 97  BILITOT 0.3 0.4  PROT 7.8 7.9  ALBUMIN 2.1* 2.2*   No results found for this basename: LIPASE:2,AMYLASE:2 in the last 168 hours No results found for this basename: AMMONIA:2 in the last 168 hours CBC:  Lab 08/28/11 0600 08/27/11 2100  WBC 11.4* 12.9*  NEUTROABS -- 9.9*  HGB 9.0* 9.1*  HCT 28.3* 28.6*  MCV 80.6 80.1  PLT 245 264   Cardiac Enzymes: No results found for this basename: CKTOTAL:3,CKMB:3,CKMBINDEX:3,TROPONINI:3 in the last 168 hours BNP:  Lab 08/27/11 2100  PROBNP 1011.0*   D-Dimer: No results found for this basename: DDIMER:2 in the last 168 hours CBG: No results found for this basename: GLUCAP:6 in the last 168 hours Hemoglobin A1C: No results found for this basename: HGBA1C in the last 168 hours Fasting Lipid Panel: No results found for this basename: CHOL,HDL,LDLCALC,TRIG,CHOLHDL,LDLDIRECT in the last 960 hours Thyroid Function Tests: No results found for this basename: TSH,T4TOTAL,FREET4,T3FREE,THYROIDAB in the last 168 hours Coagulation:  Lab 08/27/11 2100  LABPROT 16.6*  INR 1.32   Anemia Panel: No results found for this basename: VITAMINB12,FOLATE,FERRITIN,TIBC,IRON,RETICCTPCT  in the last 168 hours Urine Drug Screen: Drugs of Abuse  No results found for this basename: labopia, cocainscrnur, labbenz, amphetmu, thcu, labbarb    Alcohol Level: No results found for this basename: ETH:2 in the last 168 hours Urinalysis:  Lab 08/28/11 0611  COLORURINE YELLOW  LABSPEC 1.031*  PHURINE 6.0  GLUCOSEU NEGATIVE  HGBUR NEGATIVE  BILIRUBINUR NEGATIVE  KETONESUR NEGATIVE  PROTEINUR NEGATIVE  UROBILINOGEN 0.2  NITRITE NEGATIVE  LEUKOCYTESUR  NEGATIVE    Micro Results: Recent Results (from the past 240 hour(s))  CLOSTRIDIUM DIFFICILE BY PCR     Status: Normal   Collection Time   08/20/11 12:48 PM      Component Value Range Status Comment   C difficile by pcr NEGATIVE  NEGATIVE  Final   CULTURE, BLOOD (ROUTINE X 2)     Status: Normal   Collection Time   08/22/11  2:20 PM      Component Value Range Status Comment   Specimen Description BLOOD RIGHT HAND   Final    Special Requests     Final    Value: BOTTLES DRAWN AEROBIC AND ANAEROBIC AERO 10CC ANA 8CC   Culture  Setup Time [id number]   Final    Culture NO GROWTH 5 DAYS   Final    Report Status 08/28/2011 FINAL   Final   CULTURE, BLOOD (ROUTINE X 2)     Status: Normal   Collection Time   08/22/11  3:15 PM      Component Value Range Status Comment   Specimen Description BLOOD HAND RIGHT   Final    Special Requests BOTTLES DRAWN AEROBIC ONLY 3CC   Final    Culture  Setup Time [id number]   Final    Culture NO GROWTH 5 DAYS   Final    Report Status 08/28/2011 FINAL   Final   MRSA PCR SCREENING     Status: Normal   Collection Time   08/27/11  5:41 PM      Component Value Range Status Comment   MRSA by PCR NEGATIVE  NEGATIVE  Final   CULTURE, BLOOD (ROUTINE X 2)     Status: Normal (Preliminary result)   Collection Time   08/27/11  9:53 PM      Component Value Range Status Comment   Specimen Description BLOOD LEFT FOOT   Final    Special Requests BOTTLES DRAWN AEROBIC AND ANAEROBIC 5CC   Final    Culture  Setup Time [id number]   Final    Culture     Final    Value:        BLOOD CULTURE RECEIVED NO GROWTH TO DATE CULTURE WILL BE HELD FOR 5 DAYS BEFORE ISSUING A FINAL NEGATIVE REPORT   Report Status PENDING   Incomplete   CULTURE, BLOOD (ROUTINE X 2)     Status: Normal (Preliminary result)   Collection Time   08/27/11  9:59 PM      Component Value Range Status Comment   Specimen Description BLOOD RIGHT FOOT   Final    Special Requests BOTTLES DRAWN AEROBIC AND  ANAEROBIC 5CC   Final    Culture  Setup Time [id number]   Final    Culture     Final    Value:        BLOOD CULTURE RECEIVED NO GROWTH TO DATE CULTURE WILL BE HELD FOR 5 DAYS BEFORE ISSUING A FINAL NEGATIVE REPORT   Report Status PENDING   Incomplete   BODY FLUID  CULTURE     Status: Normal (Preliminary result)   Collection Time   08/28/11  3:54 PM      Component Value Range Status Comment   Specimen Description PLEURAL FLUID RIGHT   Final    Special Requests SYR   Final    Gram Stain     Final    Value: RARE WBC PRESENT,BOTH PMN AND MONONUCLEAR     NO ORGANISMS SEEN   Culture PENDING   Incomplete    Report Status PENDING   Incomplete   ANAEROBIC CULTURE     Status: Normal (Preliminary result)   Collection Time   08/28/11  3:55 PM      Component Value Range Status Comment   Specimen Description PLEURAL FLUID RIGHT   Final    Special Requests SYR   Final    Gram Stain     Final    Value: RARE WBC PRESENT,BOTH PMN AND MONONUCLEAR     NO ORGANISMS SEEN   Culture     Final    Value: NO ANAEROBES ISOLATED; CULTURE IN PROGRESS FOR 5 DAYS   Report Status PENDING   Incomplete    Studies/Results: Dg Chest 1 View  08/28/2011  *RADIOLOGY REPORT*  Clinical Data: 66 year old male with pulmonary emboli.  Status post thoracentesis.  CHEST - 1 VIEW  Comparison: 08/27/2011 and earlier.  Findings: Stable right PICC line.  Stable cardiac size and mediastinal contours.  No pneumothorax.  Small residual right pleural effusion with superimposed patchy and confluent airspace disease at the right lung base.  Streaky opacity at the left base. Stable lung apices. Visualized tracheal air column is within normal limits.  IMPRESSION: No pneumothorax following thoracentesis.  Small right pleural effusion and right greater than left airspace disease as the sequelae of pulmonary emboli.  Original Report Authenticated By: Harley Hallmark, M.D.   Ct Angio Chest W/cm &/or Wo Cm  08/27/2011  *RADIOLOGY REPORT*   Clinical Data: 66 year old male with recent diagnosis of acute pulmonary embolus.  Shortness of breath, rapid respiratory rate, chest pain.  CT ANGIOGRAPHY CHEST  Technique:  Multidetector CT imaging of the chest using the standard protocol during bolus administration of intravenous contrast. Multiplanar reconstructed images including MIPs were obtained and reviewed to evaluate the vascular anatomy.  Contrast: OMNIPAQUE IOHEXOL 350 MG/ML SOLN  Comparison: 08/18/2011 and earlier.  Findings: Good contrast bolus timing in the pulmonary arterial tree.  The bilateral pulmonary emboli with bulky volume of right pulmonary artery thrombus again noted.  No significant interval change.  No saddle embolus.  Left straightening of the interventricular cardiac septum on today's images.  Increased layering right pleural effusion.  No pericardial or left pleural effusion.  Stable small mediastinal lymph nodes.  Right PICC line in place.  Stable visualized upper abdominal viscera.  Sequelae median sternotomy. Stable visualized osseous structures.  Right lower lobe airspace disease has not significantly changed. Right middle lobe occasional peribronchovascular nodularity is stable.  Decreased but not resolved left lower lobe superior segment airspace disease.  Left superior anterior chest wall hematoma containing some gas re- identified.  IMPRESSION: 1.  Bilateral pulmonary emboli with bulky right lower lobe thrombus has not significantly changed since 08/18/2011. 2.  Mildly increased right pleural effusion.  Stable extensive right lower lobe airspace disease.  Stable or minimally improved ventilation elsewhere. 3. Small anterior chest wall hematoma with trace gas re-identified.  Original Report Authenticated By: Harley Hallmark, M.D.   Dg Chest [facility]  08/29/2011  *RADIOLOGY REPORT*  Clinical Data: Cough, shortness of breath.  PORTABLE CHEST - 1 VIEW  Comparison: 08/28/2011  Findings: Right PICC line remains in place  with the tip in the right atrium, unchanged.  Prior CABG.  Consolidation in the right lower lobe, unchanged.  No focal opacity on the left.  Heart is mildly enlarged.  IMPRESSION: Continued right lower lobe consolidation.  Improving opacities in the left lung.  Mild cardiomegaly.  Original Report Authenticated By: Cyndie Chime, M.D.   US Thoracentesis Asp Pleural Space W/img Guide  08/28/2011  *RADIOLOGY REPORT*  Clinical Data:  Patient with pleuritic chest pain, recent finding of pulmonary embolus, right-sided pleural effusion  ULTRASOUND GUIDED right THORACENTESIS  Comparison:  None  An ultrasound guided thoracentesis was thoroughly discussed with the patient and questions answered.  The benefits, risks, alternatives and complications were also discussed.  The patient understands and wishes to proceed with the procedure.  Written consent was obtained.  Ultrasound was performed to localize and mark an adequate pocket of fluid in the right chest.  The area was then prepped and draped in the normal sterile fashion.  1% Lidocaine was used for local anesthesia.  Under ultrasound guidance a 19 gauge Yueh catheter was introduced.  Thoracentesis was performed.  The catheter was removed and a dressing applied.  Complications:  The patient had mild vasovagal response post procedure with a slight drop in his blood pressure to 98/72. He recovered well after and had no loss of consciousness. BP was then back to baseline at 119/72  Findings: A total of approximately 240 ml of blood tinged serous fluid was removed. A fluid sample was sent for laboratory analysis.  IMPRESSION: Successful ultrasound guided right thoracentesis yielding 240 ml of pleural fluid.  Read by Brayton El PA-C  Original Report Authenticated By: Reola Calkins, M.D.   Medications:  I have reviewed the patient's current medications. Prior to Admission:  Prescriptions prior to admission  Medication Sig Dispense Refill  . acetaminophen  (TYLENOL) 325 MG tablet Take 325-650 mg by mouth every 4 (four) hours as needed. For pain/fever      . Ascorbic Acid (VITAMIN C) 100 MG tablet Take 100 mg by mouth daily.      [name] aspirin EC 81 MG EC tablet Take 1 tablet (81 mg total) by mouth daily.      . calcium carbonate (TUMS - DOSED IN MG ELEMENTAL CALCIUM) 500 MG chewable tablet Chew 1 tablet by mouth 3 (three) times daily.      . clonazePAM (KLONOPIN) 0.5 MG tablet Take 1 tablet (0.5 mg total) by mouth 2 (two) times daily as needed for anxiety.  30 tablet  1  . esomeprazole (NEXIUM) 40 MG capsule Take 40 mg by mouth 2 (two) times daily.       . famotidine (PEPCID) 10 MG tablet Take 10 mg by mouth 2 (two) times daily.      . feeding supplement (ENSURE COMPLETE) LIQD Take 237 mLs by mouth 2 (two) times daily between meals.      . Flaxseed, Linseed, 1000 MG CAPS Take 1 capsule (1,000 mg total) by mouth daily.      . furosemide (LASIX) 40 MG tablet Take 1 tablet (40 mg total) by mouth daily.  30 tablet  3  . metoprolol (LOPRESSOR) 50 MG tablet Take 0.5 tablets (25 mg total) by mouth 2 (two) times daily.  30 tablet  1  . Multiple Vitamins-Minerals (MULTIVITAMIN) tablet Take 1 tablet  by mouth daily with breakfast.   30 tablet    . oxyCODONE-acetaminophen (PERCOCET) 5-325 MG per tablet Take 1 tablet by mouth every 6 (six) hours as needed for pain. Do not take more than 4000 mg of acetaminophen in 24 hours. Both Tylenol and Percocet contain acetaminophen.  32 tablet  0  . sodium chloride 0.9 % SOLN 100 mL with micafungin 50 MG SOLR 150 mg Inject 150 mg into the vein daily. Dispense quantity sufficient to treat until on 09/26/2011. Patient is to receive Micafungin via PICC line daily. Via home health agency. PROTECT FROM LIGHT - DO NOT SHAKE  34 application  0  . DISCONTD: acetaminophen (TYLENOL) 325 MG tablet Take 1-2 tablets (325-650 mg total) by mouth every 4 (four) hours as needed.      . polyethylene glycol (MIRALAX / GLYCOLAX) packet Take 17 g by  mouth daily as needed.  14 each  11   Anti-infectives     Start     Dose/Rate Route Frequency Ordered Stop   08/27/11 2200   micafungin (MYCAMINE) 150 mg in sodium chloride 0.9 % 100 mL IVPB        150 mg 100 mL/hr over 1 Hours Intravenous Every 24 hours 08/27/11 2031           Scheduled Meds:   . albuterol  2 puff Inhalation Q6H  . aspirin EC  81 mg Oral Daily  . calcium carbonate  1 tablet Oral TID WC  . enoxaparin (LOVENOX) injection  40 mg Subcutaneous Q24H  . feeding supplement  237 mL Oral BID BM  . furosemide  40 mg Intravenous STAT  . furosemide  40 mg Oral Daily  . ibuprofen  600 mg Oral QID  . metoprolol  25 mg Oral BID  . micafungin (MYCAMINE) IV  150 mg Intravenous Q24H  . mulitivitamin with minerals  1 tablet Oral Daily  . pantoprazole  40 mg Oral Q1200  . sodium chloride  3 mL Intravenous Q12H  . sodium chloride  3 mL Intravenous Q12H  . vitamin C  250 mg Oral Daily   Continuous Infusions:  PRN Meds:.sodium chloride, acetaminophen, clonazePAM, oxyCODONE-acetaminophen, polyethylene glycol, sodium chloride Assessment/Plan:  Pleural effusion - Patient is day 1 s/p thoracentesis drainage of his R effusion yesterday (240 cc removed--blood tinged). He felt well shortly after and then progressively felt worse yesterday evening and thru the night. Analysis revealed exudative fluid (LDH pleural/serum ratio = 1.6 and Protein pleural/serum ratio = 0.68, per Light's criteria significant for exudate).  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 His fevers are likely due to gradual pieces of the mass breaking off, with appropriate  immune response causing him to have fevers every couple of days. Question of his old pacer pocket having an abscess is a possibility though not as likely at this point.  - blood cultures x 2 pending: NTD - continue micafungin x 6 weeks (08/15/11>>)  GERD / Esophageal Candidiasis - Stable.  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 His IgM and IgA were increased during his last admission, which could likely be due to his acute illnesses. Hematology consult during previous admission reccommended follow-up after his current infection is completely resolved. - repeat SPEP/UPEP after full resolve of Candida  Tachycardia - Patient continues to remain tachycardic. In light of his decompensation increasing beta blocker would not be useful so will hold off for now. Also, stress response is component of this. -continue metoprolol at current regimen  Anxiety - Patient remains anxious and sleep deprived. His anxiety medicine does work when he takes it. - continue clonazepam   Insomnia - Patient reports not sleeping well at night. He does note that his PRN percocet does make him sleepy so I advised him to try taking his dose later in the evening to help him with sleep. - patient will ask for percocet before bedtime  Normocytic anemia - Resolved. 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 We will continue to monitor his CBC and as needed. - continue to check CBC  Hyponatremia - Na = 129 today. Stable from yesterday and likely 2/2 his CHF and removal of his biV ICD. - will continue to monitor  Disposition - Patient will get extra lasix today and albuterol for relief. Pleural fluid cultures pending    LOS: 2 days   This is a Psychologist, occupational Note.  The care of the patient was discussed  with Dr. Quentin Ore and the assessment and plan formulated with their assistance.  Please see their attached note for official documentation of the daily encounter.  Lewie Chamber 08/29/2011, 10:53 AM

## 2011-08-30 DIAGNOSIS — I2699 Other pulmonary embolism without acute cor pulmonale: Secondary | ICD-10-CM

## 2011-08-30 DIAGNOSIS — E46 Unspecified protein-calorie malnutrition: Secondary | ICD-10-CM | POA: Diagnosis present

## 2011-08-30 DIAGNOSIS — I5022 Chronic systolic (congestive) heart failure: Secondary | ICD-10-CM

## 2011-08-30 DIAGNOSIS — I509 Heart failure, unspecified: Secondary | ICD-10-CM

## 2011-08-30 LAB — CARDIAC PANEL(CRET KIN+CKTOT+MB+TROPI)
Relative Index: INVALID (ref 0.0–2.5)
Total CK: 9 U/L (ref 7–232)
Troponin I: <0.3 ng/mL (ref <0.30)

## 2011-08-30 LAB — CBC
HCT: 28.8 % — ABNORMAL LOW (ref 39.0–52.0)
MCHC: 30.9 g/dL (ref 30.0–36.0)
RDW: 15.7 % — ABNORMAL HIGH (ref 11.5–15.5)
WBC: 8.1 10*3/uL (ref 4.0–10.5)

## 2011-08-30 LAB — BASIC METABOLIC PANEL
CO2: 30 mEq/L (ref 19–32)
Chloride: 92 mEq/L — ABNORMAL LOW (ref 96–112)
Creatinine, Ser: 0.71 mg/dL (ref 0.50–1.35)
GFR calc Af Amer: >90 mL/min (ref >90)
Potassium: 4.3 mEq/L (ref 3.5–5.1)
Sodium: 132 mEq/L — ABNORMAL LOW (ref 135–145)

## 2011-08-30 LAB — DIFFERENTIAL
Basophils Absolute: 0 10*3/uL (ref 0.0–0.1)
Basophils Relative: 1 % (ref 0–1)
Lymphocytes Relative: 18 % (ref 12–46)
Neutro Abs: 5.8 10*3/uL (ref 1.7–7.7)
Neutrophils Relative %: 71 % (ref 43–77)

## 2011-08-30 MED ORDER — FUROSEMIDE 20 MG PO TABS
20.0000 mg | ORAL_TABLET | Freq: Once | ORAL | Status: AC
  Administered 2011-08-30: 20 mg via ORAL
  Filled 2011-08-30: qty 1

## 2011-08-30 MED ORDER — TRAZODONE HCL 50 MG PO TABS
50.0000 mg | ORAL_TABLET | Freq: Every day | ORAL | Status: DC
  Administered 2011-08-30 – 2011-09-10 (×12): 50 mg via ORAL
  Filled 2011-08-30 (×13): qty 1

## 2011-08-30 MED ORDER — FUROSEMIDE 40 MG PO TABS
60.0000 mg | ORAL_TABLET | Freq: Every day | ORAL | Status: DC
  Administered 2011-08-31 – 2011-09-14 (×15): 60 mg via ORAL
  Filled 2011-08-30 (×15): qty 1

## 2011-08-30 MED ORDER — METOPROLOL TARTRATE 50 MG PO TABS
50.0000 mg | ORAL_TABLET | Freq: Two times a day (BID) | ORAL | Status: DC
  Administered 2011-08-30 – 2011-09-07 (×16): 50 mg via ORAL
  Filled 2011-08-30 (×17): qty 1

## 2011-08-30 NOTE — Progress Notes (Addendum)
Occupational Therapy Treatment Patient Details Name: TEL HEVIA MRN: [id number] DOB: 03/01/1946 Today's Date: 08/30/2011 Time: 2841-3244 OT Time Calculation (min): 40 min  OT Assessment / Plan / Recommendation Comments on Treatment Session Pt. participated in grooming tasks (shaving, cleaning dentures) and walked to the bathroom. He was SOB during session on 1 L of O2. His O2 saturation was 88 after shaving.    Follow Up Recommendations  No OT follow up    Barriers to Discharge       Equipment Recommendations       Recommendations for Other Services    Frequency     Plan Discharge plan remains appropriate    Precautions / Restrictions Precautions Precautions: Fall Restrictions Weight Bearing Restrictions: No   Pertinent Vitals/Pain Vitals monitored and stable. O2 saturation dropped to 88% after shaving.    ADL  Grooming: Performed;Shaving;Denture care;Set up Where Assessed - Grooming: Supported standing Upper Body Dressing: Performed;Set up Where Assessed - Upper Body Dressing: Supported sitting Equipment Used: Gait belt;Rolling walker Transfers/Ambulation Related to ADLs: Pt ambulated to the bathroom with supervision. Fatigues very quickly on 1 L of O2. ADL Comments: Educated pt. on energy conservation during session.    OT Diagnosis:    OT Problem List:   OT Treatment Interventions:     OT Goals ADL Goals ADL Goal: Grooming - Progress: Progressing toward goals  Visit Information  Last OT Received On: 08/30/11 Assistance Needed: +1    Subjective Data   "Today has been rough."   Prior Functioning       Cognition  Overall Cognitive Status: Appears within functional limits for tasks assessed/performed Arousal/Alertness: Awake/alert Orientation Level: Appears intact for tasks assessed Behavior During Session: [facility] for tasks performed    Mobility Bed Mobility Bed Mobility: Right Sidelying to Sit;Rolling Right;Supine to Sit;Sitting - Scoot to Edge of  Bed Rolling Right: With rail;6: Modified independent (Device/Increase time) Right Sidelying to Sit: With rails;HOB elevated;6: Modified independent (Device/Increase time) Supine to Sit: HOB elevated;With rails;6: Modified independent (Device/Increase time) Sitting - Scoot to Edge of Bed: 6: Modified independent (Device/Increase time) Transfers Transfers: Sit to Stand;Stand to Sit Sit to Stand: From chair/3-in-1;With armrests;With upper extremity assist;From elevated surface;5: Supervision Stand to Sit: 5: Supervision;With armrests;To chair/3-in-1;To bed;With upper extremity assist   Exercises    Balance    End of Session OT - End of Session Activity Tolerance: Patient limited by fatigue Patient left: in bed;with call bell/phone within reach;with family/visitor present  Pt has 24/7 (A) from wife and wife plans to help with all BADLS.    Jenell Milliner 08/30/2011, 3:22 PM  Lucile Shutters   OTR/L Pager: [phone] Office: [phone] .

## 2011-08-30 NOTE — Progress Notes (Signed)
INTERNAL MEDICINE DAILY PROGRESS NOTE  [name] MRN: [id number] DOB/AGE: 1946/03/20 66 y.o.  Admit date: 08/27/2011  Subjective: [name] states he felt improved this morning when I saw him. However between my rounds and team rounds he stated he felt poorly again with shortness of breath and malaise. He tells me he slept very well last night after he got the trazodone. No fevers or chills last night. Was very happy to work with PT and OT yesterday in the afternoon after he urinated in response to Lasix.  Objective: Vital signs in last 24 hours: Filed Vitals:   08/30/11 0500 08/30/11 0800 08/30/11 0919 08/30/11 1400  BP: 130/81  121/74 107/69  Pulse: 87  122 90  Temp: 98 F (36.7 C)  99.3 F (37.4 C) 97.8 F (36.6 C)  TempSrc: Oral   Oral  Resp: 18   17  Height: 5\' 10"  (1.778 m)     Weight: 181 lb 9.6 oz (82.373 kg)     SpO2: 99% 99%  99%   Weight change:   Intake/Output Summary (Last 24 hours) at 08/30/11 1507 Last data filed at 08/30/11 0800  Gross per 24 hour  Intake    600 ml  Output    625 ml  Net    -25 ml   Physical Exam: General: resting in bed, much more comfortably today. Can hold a conversation without being out of breath Cardiac: Regular tachycardia, no rubs, murmurs or gallops Pulm: Good air movement bilaterally, crackles present in the right lung fields. I can hear air movement bilaterally. Pacemaker pocket is swollen and firm. I can detect no fluctuance, warmth, tenderness, erythema, or purulent drainage. Abd: soft, nontender, nondistended, BS present Ext: Cool but otherwise well perfused, no pedal edema   Lab Results: Basic Metabolic Panel:  Lab 08/30/11 4098 08/29/11 1105  NA 132* 130*  K 4.3 5.0  CL 92* 91*  CO2 30 27  GLUCOSE 96 180*  BUN 20 16  CREATININE 0.71 0.75  CALCIUM 9.5 9.4  MG -- --  PHOS -- --   Liver Function Tests:  Lab 08/28/11 0600 08/27/11 2100  AST 26 27  ALT 13 15  ALKPHOS 95 97  BILITOT 0.3 0.4   PROT 7.8 7.9  ALBUMIN 2.1* 2.2*   CBC:  Lab 08/30/11 0434 08/28/11 0600 08/27/11 2100  WBC 8.1 11.4* --  NEUTROABS 5.8 -- 9.9*  HGB 8.9* 9.0* --  HCT 28.8* 28.3* --  MCV 81.6 80.6 --  PLT 293 245 --   BNP:  Lab 08/27/11 2100  PROBNP 1011.0*   Coagulation:  Lab 08/27/11 2100  LABPROT 16.6*  INR 1.32   Urinalysis:  Lab 08/28/11 0611  COLORURINE YELLOW  LABSPEC 1.031*  PHURINE 6.0  GLUCOSEU NEGATIVE  HGBUR NEGATIVE  BILIRUBINUR NEGATIVE  KETONESUR NEGATIVE  PROTEINUR NEGATIVE  UROBILINOGEN 0.2  NITRITE NEGATIVE  LEUKOCYTESUR NEGATIVE    Micro Results: Recent Results (from the past 240 hour(s))  CULTURE, BLOOD (ROUTINE X 2)     Status: Normal   Collection Time   08/22/11  2:20 PM      Component Value Range Status Comment   Specimen Description BLOOD RIGHT HAND   Final    Special Requests     Final    Value: BOTTLES DRAWN AEROBIC AND ANAEROBIC AERO 10CC ANA 8CC   Culture  Setup Time [id number]   Final    Culture NO GROWTH 5 DAYS   Final    Report Status 08/28/2011  FINAL   Final   CULTURE, BLOOD (ROUTINE X 2)     Status: Normal   Collection Time   08/22/11  3:15 PM      Component Value Range Status Comment   Specimen Description BLOOD HAND RIGHT   Final    Special Requests BOTTLES DRAWN AEROBIC ONLY 3CC   Final    Culture  Setup Time [id number]   Final    Culture NO GROWTH 5 DAYS   Final    Report Status 08/28/2011 FINAL   Final   MRSA PCR SCREENING     Status: Normal   Collection Time   08/27/11  5:41 PM      Component Value Range Status Comment   MRSA by PCR NEGATIVE  NEGATIVE  Final   CULTURE, BLOOD (ROUTINE X 2)     Status: Normal (Preliminary result)   Collection Time   08/27/11  9:53 PM      Component Value Range Status Comment   Specimen Description BLOOD LEFT FOOT   Final    Special Requests BOTTLES DRAWN AEROBIC AND ANAEROBIC 5CC   Final    Culture  Setup Time [id number]   Final    Culture     Final    Value:        BLOOD CULTURE  RECEIVED NO GROWTH TO DATE CULTURE WILL BE HELD FOR 5 DAYS BEFORE ISSUING A FINAL NEGATIVE REPORT   Report Status PENDING   Incomplete   CULTURE, BLOOD (ROUTINE X 2)     Status: Normal (Preliminary result)   Collection Time   08/27/11  9:59 PM      Component Value Range Status Comment   Specimen Description BLOOD RIGHT FOOT   Final    Special Requests BOTTLES DRAWN AEROBIC AND ANAEROBIC 5CC   Final    Culture  Setup Time [id number]   Final    Culture     Final    Value:        BLOOD CULTURE RECEIVED NO GROWTH TO DATE CULTURE WILL BE HELD FOR 5 DAYS BEFORE ISSUING A FINAL NEGATIVE REPORT   Report Status PENDING   Incomplete   BODY FLUID CULTURE     Status: Normal (Preliminary result)   Collection Time   08/28/11  3:54 PM      Component Value Range Status Comment   Specimen Description PLEURAL FLUID RIGHT   Final    Special Requests SYR   Final    Gram Stain     Final    Value: RARE WBC PRESENT,BOTH PMN AND MONONUCLEAR     NO ORGANISMS SEEN   Culture NO GROWTH 1 DAY   Final    Report Status PENDING   Incomplete   FUNGUS CULTURE W SMEAR     Status: Normal (Preliminary result)   Collection Time   08/28/11  3:55 PM      Component Value Range Status Comment   Specimen Description PLEURAL FLUID RIGHT   Final    Special Requests SYR   Final    Fungal Smear NO YEAST OR FUNGAL ELEMENTS SEEN   Final    Culture CULTURE IN PROGRESS FOR FOUR WEEKS   Final    Report Status PENDING   Incomplete   ANAEROBIC CULTURE     Status: Normal (Preliminary result)   Collection Time   08/28/11  3:55 PM      Component Value Range Status Comment   Specimen Description PLEURAL FLUID RIGHT  Final    Special Requests SYR   Final    Gram Stain     Final    Value: RARE WBC PRESENT,BOTH PMN AND MONONUCLEAR     NO ORGANISMS SEEN   Culture     Final    Value: NO ANAEROBES ISOLATED; CULTURE IN PROGRESS FOR 5 DAYS   Report Status PENDING   Incomplete   AFB CULTURE WITH SMEAR     Status: Normal  (Preliminary result)   Collection Time   08/28/11  3:56 PM      Component Value Range Status Comment   Specimen Description PLEURAL FLUID RIGHT   Final    Special Requests SYR   Final    ACID FAST SMEAR NO ACID FAST BACILLI SEEN   Final    Culture     Final    Value: CULTURE WILL BE EXAMINED FOR 6 WEEKS BEFORE ISSUING A FINAL REPORT   Report Status PENDING   Incomplete    Studies/Results: Dg Chest 1 View  08/28/2011  *RADIOLOGY REPORT*  Clinical Data: 66 year old male with pulmonary emboli.  Status post thoracentesis.  CHEST - 1 VIEW  Comparison: 08/27/2011 and earlier.  Findings: Stable right PICC line.  Stable cardiac size and mediastinal contours.  No pneumothorax.  Small residual right pleural effusion with superimposed patchy and confluent airspace disease at the right lung base.  Streaky opacity at the left base. Stable lung apices. Visualized tracheal air column is within normal limits.  IMPRESSION: No pneumothorax following thoracentesis.  Small right pleural effusion and right greater than left airspace disease as the sequelae of pulmonary emboli.  Original Report Authenticated By: Harley Hallmark, M.D.   Dg Chest Port 1 View  08/29/2011  *RADIOLOGY REPORT*  Clinical Data: Cough, shortness of breath.  PORTABLE CHEST - 1 VIEW  Comparison: 08/28/2011  Findings: Right PICC line remains in place with the tip in the right atrium, unchanged.  Prior CABG.  Consolidation in the right lower lobe, unchanged.  No focal opacity on the left.  Heart is mildly enlarged.  IMPRESSION: Continued right lower lobe consolidation.  Improving opacities in the left lung.  Mild cardiomegaly.  Original Report Authenticated By: Cyndie Chime, M.D.   US Thoracentesis Asp Pleural Space W/img Guide  08/28/2011  *RADIOLOGY REPORT*  Clinical Data:  Patient with pleuritic chest pain, recent finding of pulmonary embolus, right-sided pleural effusion  ULTRASOUND GUIDED right THORACENTESIS  Comparison:  None  An  ultrasound guided thoracentesis was thoroughly discussed with the patient and questions answered.  The benefits, risks, alternatives and complications were also discussed.  The patient understands and wishes to proceed with the procedure.  Written consent was obtained.  Ultrasound was performed to localize and mark an adequate pocket of fluid in the right chest.  The area was then prepped and draped in the normal sterile fashion.  1% Lidocaine was used for local anesthesia.  Under ultrasound guidance a 19 gauge Yueh catheter was introduced.  Thoracentesis was performed.  The catheter was removed and a dressing applied.  Complications:  The patient had mild vasovagal response post procedure with a slight drop in his blood pressure to 98/72. He recovered well after and had no loss of consciousness. BP was then back to baseline at 119/72  Findings: A total of approximately 240 ml of blood tinged serous fluid was removed. A fluid sample was sent for laboratory analysis.  IMPRESSION: Successful ultrasound guided right thoracentesis yielding 240 ml of pleural fluid.  Read by Brayton El PA-C  Original Report Authenticated By: Reola Calkins, M.D.   Medications: I have reviewed the patient's current medications. Scheduled Meds:    . albuterol  2 puff Inhalation Q6H  . aspirin EC  81 mg Oral Daily  . calcium carbonate  1 tablet Oral TID WC  . enoxaparin (LOVENOX) injection  40 mg Subcutaneous Q24H  . feeding supplement  237 mL Oral BID BM  . furosemide  40 mg Oral Daily  . ibuprofen  600 mg Oral QID  . metoprolol  25 mg Oral BID  . micafungin (MYCAMINE) IV  150 mg Intravenous Q24H  . mulitivitamin with minerals  1 tablet Oral Daily  . pantoprazole  40 mg Oral Q1200  . sodium chloride  3 mL Intravenous Q12H  . traZODone  50 mg Oral Once  . traZODone  50 mg Oral QHS  . vitamin C  250 mg Oral Daily  . DISCONTD: sodium chloride  3 mL Intravenous Q12H   Continuous Infusions:  PRN Meds:.acetaminophen,  clonazePAM, oxyCODONE-acetaminophen, polyethylene glycol, sodium chloride, DISCONTD: sodium chloride, DISCONTD: sodium chloride Assessment/Plan: Assessment & Plan by Problem: 66 y/o m with pmh of recent prolonged and complicated hospitalization for fungal infection of the pacer leads leading to septic pulmonary emboli and fungemia causing septic shock is was admitted for chest pain and dyspnea is secondary to pleural effusion. Received diagnostic and therapeutic thoracentesis 08/28/2011. He had some dyspnea on the evening of 5/29-5/30 however this resolved with albuterol and furosemide.  1. Pulmonary effusion/pulmonary embolism/pulmonary infarction: CTA of chest showed mildly increased right pleural effusion without significant changes from 08/18/2011 with persistent bilateral PE with RLL thrombus and RLL extensive airspace disease, and small chest hematoma with trace gas. Chest pain shortness of breath likely pleuritic in nature secondary to pleural effusion. 08/28/2011 received ultrasound guided diagnostic and therapeutic thoracentesis to evaluate pleural fluid. Initial tap was blood tinged fluid. This is most consistent with his known pulmonary infarction. The initial results show a clearly exudative effusion with fluid LDH> serum LDH. Thus far no organism seen on smears or culture. Initial concern yesterday was for pneumothorax versus hemothorax post procedure. Portable chest x-ray showed no evidence of either pneumothorax or hemothorax. Nor was there increased consolidation from prior that would be consistent with reexpansion pulmonary edema or pneumonia. Overall increase shortness of breath likely secondary to combination of low functional reserve, incomplete expansion of right lung post procedure, and possible CHF. Treatment have focused on increasing furosemide and scheduled albuterol nebs to recruit as many alveoli as possible.  -- Follow AFB culture an stain fungal culture and stain, anaerobic culture  and conventional fluid culture -- Will treat with oxycodone-acetaminophen PRN and scheduled ibuprofen (he is not be discharged on NSAIDS long term due to cardiac risk) -- will not anticoagulate at therapeutic doses due to risk of hemorrhage and the emboli are not thromboembolic in nature.  -- will increase furosemide today to 60 mg PO daily (will get 20mg  this afternoon)  -- Will continue 2 puffs albuterol QID  2. acute on chronic systolic congestive heart failure due to CAD and ischemic cardiomyopathy - EF 30% with mild LVH and diffuse hypokinesis, proBNP elevated but decreased from prior admission.  Shortness of breath is likely secondary to acute on chronic systolic CHF. He has lost a lot of pulmonary reserve, do to pulmonary infarction. 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 His increased shortness of breath the  night before last and is likely secondary to fluid accumulation. This resolved with increased furosemide. -- increase his furosemide to 60 mg daily by mouth.  - continue secondary prevention with aspirin, beta blocker in addition to daily lasix as noted above. Of Note pt has ACEI and ARB allergy.  -- will give additional dose of furosemide today 20 mg PO to catch up to 60 mg daily dose - has pending referral to cardiac rehab  - new pacemaker to be placed upon resolution of infection  - monitor I/Os   3. Persistent Fever with increased leukocytosis- the most likely cause of persistent fever is his known persistent fungal infection that is gradually resolving. Less likely causes are fungal infection resistant to mycofungin (unlikely), or necrotic pulmonary process.  UTI was clean and cultures taken yesterday have not yet resulted. Abscess of the pacer pocket is a possibility, though less likely as it does not appear clinically to be infected. Blood cultures negative to date. 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 Will plan on continuing  micafungin for now. The patient has done very well over the past couple days. We will continue to monitor for fever, but will expect that occasionally these will arise - monitor for fevers  - Will schedule ibuprofen - Follow blood cultures x2  - Continue mycofungin for total of 6 weeks per recent discharge plans  - will defer additional antibiotics pending blood culture results  - Will defer ultrasound of the pacer pocket until evaluation of pulmonary fluid complete  4. GERD/Candida Esophagitis - stable, will continue with continue micafungin and PPI.   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 Tachycardia- In setting of fever, anxiety, active infection and chest pain. Will not increase beta blocker at this time as he is are feeling fatigued and may be somewhat decompensated. -- will increase metoprolol to 50 mg BID  7. Anxiety and insomnia- Mr. Fluegel slept much better last night with the addition of trazodone. . Will continue clonazepam and trazodone for sleep. Patient is to maintain good sleep hygine today and not nap during the day.   8. Normocytic anemia - stable Hgb from recent hospitalization range 8-9, previously 14 in March 2013. His anemia panel demonstrated: Iron = 21, Sat ratio = 9, TIBC =243, Ferritin = 597, Retic count =1.9, FOBT negative. Peripheral smear revealed mild anemia with normal cell morphology. Findings consistent with Anemia of Chronic Disease given that TIBC is normal (usually elevated in Iron Deficiency Anemia) and ferritin high although could still be a component of acute phase reactant.  -con to monitor CBC   9. Hyponatremia: Mild iand improving with furosemide. Likely secondary to a CHF. -cont to montior   10. 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 We will continue allowing the patient to eat ad lib. and continue strawberry clinical strength Ensure.  DVT prophylaxis - lovenox 40       LOS: 3 days   Jaleesa Cervi PGY-I, Internal Medicine Resident  Pager: [phone] (7AM-5PM) 08/30/2011, 3:07 PM

## 2011-08-30 NOTE — Progress Notes (Signed)
Internal Medicine Attending  Date: 08/30/2011  Patient name: [name] Medical record number: [id number] Date of birth: [birth year] Age: 66 y.o. Gender: male  I saw and evaluated the patient. I reviewed the resident's note by Dr. Candy Sledge and I agree with the resident's findings and plans as documented in his progress note.  [name] was seen on rounds this AM.  Diuresis and initiation of bronchodilators resulted in a marked improvement in his dyspnea yesterday.  Sleep hygiene was also improved yesterday resulting in less daytime napping and much improved nighttime sleeping.  This AM he was slightly more dyspneic again.  I agree with the plan to increase the lasix dose to 60 mg PO QD and consider titrating up the beta blocker therapy given his underlying cardiomyopathy.  He has not tolerated ACEI or ARBs in the past so these are not an option.  If necessary, spironolactone could be added in the future and the ibuprofen discontinued in order to achieve better control of his dyspnea related to his acute on chronic systolic heart failure.  We will also continue the micafungin pending the sensitivities of the Candida albicans grown on the pacemaker wire.  We have initiated the placement process as this may take some extra time with his [facility].

## 2011-08-30 NOTE — Progress Notes (Signed)
Medical Student Daily Progress Note  Subjective: Patient is overall doing better than yesterday. Over the last 48 hours he has fluctuated greatly in his condition. Most recently he has been improving. His breathing is better, less labored, and his strength is improving which has allowed him to get up and walk with PT/OT yesterday and hopeful for again later today. He is resting in bed comfortably upon exam.  Objective: Vital signs in last 24 hours: Filed Vitals:   08/29/11 2100 08/30/11 0500 08/30/11 0800 08/30/11 0919  BP: 127/75 130/81  121/74  Pulse: 96 87  122  Temp: 97.8 F (36.6 C) 98 F (36.7 C)  99.3 F (37.4 C)  TempSrc: Oral Oral    Resp: 18 18    Height:  5\' 10"  (1.778 m)    Weight:  82.373 kg (181 lb 9.6 oz)    SpO2: 98% 99% 99%    Weight change:   Intake/Output Summary (Last 24 hours) at 08/30/11 1251 Last data filed at 08/30/11 0700  Gross per 24 hour  Intake    240 ml  Output    625 ml  Net   -385 ml   Physical Exam: BP 121/74  Pulse 122  Temp(Src) 99.3 F (37.4 C) (Oral)  Resp 18  Ht 5\' 10"  (1.778 m)  Wt 82.373 kg (181 lb 9.6 oz)  BMI 26.06 kg/m2  SpO2 99% General appearance: alert, cooperative and no distress Head: Normocephalic, without obvious abnormality, atraumatic Back: symmetric, no curvature. ROM normal. No CVA tenderness. Lungs: diminished breath sounds RML and rales RLL and RML Chest wall: no tenderness Heart: regular rate and rhythm, S1, S2 normal, no murmur, click, rub or gallop Abdomen: soft, non-tender; bowel sounds normal; no masses,  no organomegaly Extremities: extremities normal, atraumatic, no cyanosis or edema Skin: Skin color, texture, turgor normal. No rashes or lesions Lab Results: Basic Metabolic Panel:  Lab 08/30/11 7829 08/29/11 1105  NA 132* 130*  K 4.3 5.0  CL 92* 91*  CO2 30 27  GLUCOSE 96 180*  BUN 20 16  CREATININE 0.71 0.75  CALCIUM 9.5 9.4  MG -- --  PHOS -- --   Liver Function Tests:  Lab 08/28/11 0600  08/27/11 2100  AST 26 27  ALT 13 15  ALKPHOS 95 97  BILITOT 0.3 0.4  PROT 7.8 7.9  ALBUMIN 2.1* 2.2*    Lab 08/29/11 1105  LIPASE --  AMYLASE 49   No results found for this basename: AMMONIA:2 in the last 168 hours CBC:  Lab 08/30/11 0434 08/28/11 0600 08/27/11 2100  WBC 8.1 11.4* --  NEUTROABS 5.8 -- 9.9*  HGB 8.9* 9.0* --  HCT 28.8* 28.3* --  MCV 81.6 80.6 --  PLT 293 245 --   Cardiac Enzymes:  Lab 08/30/11 0434 08/29/11 2014 08/29/11 1105  CKTOTAL 9 9 16   CKMB 1.3 1.2 1.0  CKMBINDEX -- -- --  TROPONINI <0.30 <0.30 <0.30   BNP:  Lab 08/27/11 2100  PROBNP 1011.0*   D-Dimer: No results found for this basename: DDIMER:2 in the last 168 hours CBG: No results found for this basename: GLUCAP:6 in the last 168 hours Hemoglobin A1C: No results found for this basename: HGBA1C in the last 168 hours Fasting Lipid Panel: No results found for this basename: CHOL,HDL,LDLCALC,TRIG,CHOLHDL,LDLDIRECT in the last 562 hours Thyroid Function Tests: No results found for this basename: TSH,T4TOTAL,FREET4,T3FREE,THYROIDAB in the last 168 hours Coagulation:  Lab 08/27/11 2100  LABPROT 16.6*  INR 1.32   Anemia Panel: No  results found for this basename: VITAMINB12,FOLATE,FERRITIN,TIBC,IRON,RETICCTPCT in the last 168 hours Urine Drug Screen: Drugs of Abuse  No results found for this basename: labopia, cocainscrnur, labbenz, amphetmu, thcu, labbarb    Alcohol Level: No results found for this basename: ETH:2 in the last 168 hours Urinalysis:  Lab 08/28/11 0611  COLORURINE YELLOW  LABSPEC 1.031*  PHURINE 6.0  GLUCOSEU NEGATIVE  HGBUR NEGATIVE  BILIRUBINUR NEGATIVE  KETONESUR NEGATIVE  PROTEINUR NEGATIVE  UROBILINOGEN 0.2  NITRITE NEGATIVE  LEUKOCYTESUR NEGATIVE    Micro Results: Recent Results (from the past 240 hour(s))  CULTURE, BLOOD (ROUTINE X 2)     Status: Normal   Collection Time   08/22/11  2:20 PM      Component Value Range Status Comment   Specimen  Description BLOOD RIGHT HAND   Final    Special Requests     Final    Value: BOTTLES DRAWN AEROBIC AND ANAEROBIC AERO 10CC ANA 8CC   Culture  Setup Time [id number]   Final    Culture NO GROWTH 5 DAYS   Final    Report Status 08/28/2011 FINAL   Final   CULTURE, BLOOD (ROUTINE X 2)     Status: Normal   Collection Time   08/22/11  3:15 PM      Component Value Range Status Comment   Specimen Description BLOOD HAND RIGHT   Final    Special Requests BOTTLES DRAWN AEROBIC ONLY 3CC   Final    Culture  Setup Time [id number]   Final    Culture NO GROWTH 5 DAYS   Final    Report Status 08/28/2011 FINAL   Final   MRSA PCR SCREENING     Status: Normal   Collection Time   08/27/11  5:41 PM      Component Value Range Status Comment   MRSA by PCR NEGATIVE  NEGATIVE  Final   CULTURE, BLOOD (ROUTINE X 2)     Status: Normal (Preliminary result)   Collection Time   08/27/11  9:53 PM      Component Value Range Status Comment   Specimen Description BLOOD LEFT FOOT   Final    Special Requests BOTTLES DRAWN AEROBIC AND ANAEROBIC 5CC   Final    Culture  Setup Time [id number]   Final    Culture     Final    Value:        BLOOD CULTURE RECEIVED NO GROWTH TO DATE CULTURE WILL BE HELD FOR 5 DAYS BEFORE ISSUING A FINAL NEGATIVE REPORT   Report Status PENDING   Incomplete   CULTURE, BLOOD (ROUTINE X 2)     Status: Normal (Preliminary result)   Collection Time   08/27/11  9:59 PM      Component Value Range Status Comment   Specimen Description BLOOD RIGHT FOOT   Final    Special Requests BOTTLES DRAWN AEROBIC AND ANAEROBIC 5CC   Final    Culture  Setup Time [id number]   Final    Culture     Final    Value:        BLOOD CULTURE RECEIVED NO GROWTH TO DATE CULTURE WILL BE HELD FOR 5 DAYS BEFORE ISSUING A FINAL NEGATIVE REPORT   Report Status PENDING   Incomplete   BODY FLUID CULTURE     Status: Normal (Preliminary result)   Collection Time   08/28/11  3:54 PM      Component Value Range Status Comment    Specimen Description PLEURAL FLUID RIGHT  Final    Special Requests SYR   Final    Gram Stain     Final    Value: RARE WBC PRESENT,BOTH PMN AND MONONUCLEAR     NO ORGANISMS SEEN   Culture NO GROWTH 1 DAY   Final    Report Status PENDING   Incomplete   FUNGUS CULTURE W SMEAR     Status: Normal (Preliminary result)   Collection Time   08/28/11  3:55 PM      Component Value Range Status Comment   Specimen Description PLEURAL FLUID RIGHT   Final    Special Requests SYR   Final    Fungal Smear NO YEAST OR FUNGAL ELEMENTS SEEN   Final    Culture CULTURE IN PROGRESS FOR FOUR WEEKS   Final    Report Status PENDING   Incomplete   ANAEROBIC CULTURE     Status: Normal (Preliminary result)   Collection Time   08/28/11  3:55 PM      Component Value Range Status Comment   Specimen Description PLEURAL FLUID RIGHT   Final    Special Requests SYR   Final    Gram Stain     Final    Value: RARE WBC PRESENT,BOTH PMN AND MONONUCLEAR     NO ORGANISMS SEEN   Culture     Final    Value: NO ANAEROBES ISOLATED; CULTURE IN PROGRESS FOR 5 DAYS   Report Status PENDING   Incomplete   AFB CULTURE WITH SMEAR     Status: Normal (Preliminary result)   Collection Time   08/28/11  3:56 PM      Component Value Range Status Comment   Specimen Description PLEURAL FLUID RIGHT   Final    Special Requests SYR   Final    ACID FAST SMEAR NO ACID FAST BACILLI SEEN   Final    Culture     Final    Value: CULTURE WILL BE EXAMINED FOR 6 WEEKS BEFORE ISSUING A FINAL REPORT   Report Status PENDING   Incomplete    Studies/Results: Dg Chest 1 View  08/28/2011  *RADIOLOGY REPORT*  Clinical Data: 66 year old male with pulmonary emboli.  Status post thoracentesis.  CHEST - 1 VIEW  Comparison: 08/27/2011 and earlier.  Findings: Stable right PICC line.  Stable cardiac size and mediastinal contours.  No pneumothorax.  Small residual right pleural effusion with superimposed patchy and confluent airspace disease at  the right lung base.  Streaky opacity at the left base. Stable lung apices. Visualized tracheal air column is within normal limits.  IMPRESSION: No pneumothorax following thoracentesis.  Small right pleural effusion and right greater than left airspace disease as the sequelae of pulmonary emboli.  Original Report Authenticated By: Harley Hallmark, M.D.   Dg Chest Port 1 View  08/29/2011  *RADIOLOGY REPORT*  Clinical Data: Cough, shortness of breath.  PORTABLE CHEST - 1 VIEW  Comparison: 08/28/2011  Findings: Right PICC line remains in place with the tip in the right atrium, unchanged.  Prior CABG.  Consolidation in the right lower lobe, unchanged.  No focal opacity on the left.  Heart is mildly enlarged.  IMPRESSION: Continued right lower lobe consolidation.  Improving opacities in the left lung.  Mild cardiomegaly.  Original Report Authenticated By: Cyndie Chime, M.D.   US Thoracentesis Asp Pleural Space W/img Guide  08/28/2011  *RADIOLOGY REPORT*  Clinical Data:  Patient with pleuritic chest pain, recent finding of pulmonary embolus, right-sided pleural  effusion  ULTRASOUND GUIDED right THORACENTESIS  Comparison:  None  An ultrasound guided thoracentesis was thoroughly discussed with the patient and questions answered.  The benefits, risks, alternatives and complications were also discussed.  The patient understands and wishes to proceed with the procedure.  Written consent was obtained.  Ultrasound was performed to localize and mark an adequate pocket of fluid in the right chest.  The area was then prepped and draped in the normal sterile fashion.  1% Lidocaine was used for local anesthesia.  Under ultrasound guidance a 19 gauge Yueh catheter was introduced.  Thoracentesis was performed.  The catheter was removed and a dressing applied.  Complications:  The patient had mild vasovagal response post procedure with a slight drop in his blood pressure to 98/72. He recovered well after and had no loss of  consciousness. BP was then back to baseline at 119/72  Findings: A total of approximately 240 ml of blood tinged serous fluid was removed. A fluid sample was sent for laboratory analysis.  IMPRESSION: Successful ultrasound guided right thoracentesis yielding 240 ml of pleural fluid.  Read by Brayton El PA-C  Original Report Authenticated By: Reola Calkins, M.D.   Medications:  I have reviewed the patient's current medications. Prior to Admission:  Prescriptions prior to admission  Medication Sig Dispense Refill  . acetaminophen (TYLENOL) 325 MG tablet Take 325-650 mg by mouth every 4 (four) hours as needed. For pain/fever      . Ascorbic Acid (VITAMIN C) 100 MG tablet Take 100 mg by mouth daily.      [name] aspirin EC 81 MG EC tablet Take 1 tablet (81 mg total) by mouth daily.      . calcium carbonate (TUMS - DOSED IN MG ELEMENTAL CALCIUM) 500 MG chewable tablet Chew 1 tablet by mouth 3 (three) times daily.      . clonazePAM (KLONOPIN) 0.5 MG tablet Take 1 tablet (0.5 mg total) by mouth 2 (two) times daily as needed for anxiety.  30 tablet  1  . esomeprazole (NEXIUM) 40 MG capsule Take 40 mg by mouth 2 (two) times daily.       . famotidine (PEPCID) 10 MG tablet Take 10 mg by mouth 2 (two) times daily.      . feeding supplement (ENSURE COMPLETE) LIQD Take 237 mLs by mouth 2 (two) times daily between meals.      . Flaxseed, Linseed, 1000 MG CAPS Take 1 capsule (1,000 mg total) by mouth daily.      . furosemide (LASIX) 40 MG tablet Take 1 tablet (40 mg total) by mouth daily.  30 tablet  3  . metoprolol (LOPRESSOR) 50 MG tablet Take 0.5 tablets (25 mg total) by mouth 2 (two) times daily.  30 tablet  1  . Multiple Vitamins-Minerals (MULTIVITAMIN) tablet Take 1 tablet by mouth daily with breakfast.   30 tablet    . oxyCODONE-acetaminophen (PERCOCET) 5-325 MG per tablet Take 1 tablet by mouth every 6 (six) hours as needed for pain. Do not take more than 4000 mg of acetaminophen in 24 hours. Both  Tylenol and Percocet contain acetaminophen.  32 tablet  0  . sodium chloride 0.9 % SOLN 100 mL with micafungin 50 MG SOLR 150 mg Inject 150 mg into the vein daily. Dispense quantity sufficient to treat until on 09/26/2011. Patient is to receive Micafungin via PICC line daily. Via home health agency. PROTECT FROM LIGHT - DO NOT SHAKE  34 application  0  . DISCONTD: acetaminophen (  TYLENOL) 325 MG tablet Take 1-2 tablets (325-650 mg total) by mouth every 4 (four) hours as needed.      . polyethylene glycol (MIRALAX / GLYCOLAX) packet Take 17 g by mouth daily as needed.  14 each  11   Anti-infectives     Start     Dose/Rate Route Frequency Ordered Stop   08/27/11 2200   micafungin (MYCAMINE) 150 mg in sodium chloride 0.9 % 100 mL IVPB        150 mg 100 mL/hr over 1 Hours Intravenous Every 24 hours 08/27/11 2031           Scheduled Meds:   . albuterol  2 puff Inhalation Q6H  . aspirin EC  81 mg Oral Daily  . calcium carbonate  1 tablet Oral TID WC  . enoxaparin (LOVENOX) injection  40 mg Subcutaneous Q24H  . feeding supplement  237 mL Oral BID BM  . furosemide  40 mg Oral Daily  . ibuprofen  600 mg Oral QID  . metoprolol  25 mg Oral BID  . micafungin (MYCAMINE) IV  150 mg Intravenous Q24H  . mulitivitamin with minerals  1 tablet Oral Daily  . pantoprazole  40 mg Oral Q1200  . sodium chloride  3 mL Intravenous Q12H  . traZODone  50 mg Oral Once  . traZODone  50 mg Oral QHS  . vitamin C  250 mg Oral Daily  . DISCONTD: sodium chloride  3 mL Intravenous Q12H   Continuous Infusions:  PRN Meds:.acetaminophen, clonazePAM, oxyCODONE-acetaminophen, polyethylene glycol, sodium chloride, DISCONTD: sodium chloride, DISCONTD: sodium chloride Assessment/Plan:  Pleural effusion - Patient is day 2 s/p thoracentesis drainage of his R effusion (240 cc removed--blood tinged). He felt well shortly after and then progressively felt worse on day 1 s/p procedure. His condition changes unpredictably daily  plus his underlying HF 2/2 removal of ICD is also a component of his condition. Analysis revealed exudative fluid (LDH pleural/serum ratio = 1.6 and Protein pleural/serum ratio = 0.68, per Light's criteria significant for exudate). CXR 08/29/11 shows RML with wedge shaped coagulation necrosis indicating infarcted lung parenchyma. Left lung shows improved opacities. 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 His fevers are likely due to gradual pieces of the mass breaking off, with appropriate immune response causing him to have fevers every couple of days. Question of his old pacer pocket having an abscess/infection low possibility at this point but has been considered (no warmth, erythema, tenderness, disproportionate swelling).  - blood cultures x 2 pending: NTD  - continue micafungin x 6 weeks (08/15/11>>)   GERD / Esophageal Candidiasis - Stable.  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 His IgM and IgA were increased during his last admission, which could likely be due to his acute  illnesses. Hematology consult during previous admission reccommended follow-up after his current infection is completely resolved.  - repeat SPEP/UPEP after full resolve of Candida   Tachycardia - Patient continues to have episodes of tachycardia. His blood  pressures have ranged 102-130 / 65-81. Also, stress response is component of this.  - increase metoprolol from 25 mg BID to 50 mg BID   Anxiety - Patient remains anxious and sleep deprived. His anxiety medicine does work when he takes it.  - continue clonazepam   Insomnia - Patient reports not sleeping well at night. He does note that his PRN percocet does make him sleepy so I advised him to try taking his dose later in the evening to help him with sleep.  - patient will ask for percocet before bedtime  - continue trazodone 50 mg qhs  Normocytic anemia - Resolved. MCV>80. 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 We will continue to monitor his CBC and as needed.  - continue to check CBC   Hyponatremia - Na = 132 today. Increased/improving from yesterday and likely 2/2 his CHF and removal of his biV ICD.  - will continue to monitor   Disposition - Increasing his lasix and metoprolol today. Social issues remain the concern: e.g. monetary assistance for his micafungin. Out of pocket expense is initially $708, plus $318.49 per week for the remaining 4 weeks of his micafungin therapy. SW and CM are helping out with this and most appreciative for their help and support.   LOS: 3 days   This is a Psychologist, occupational Note.  The care of the patient was discussed with Dr. Quentin Ore and the assessment and plan formulated with their assistance.  Please see their attached note for official documentation of the daily encounter.  Lewie Chamber 08/30/2011, 12:51 PM

## 2011-08-30 NOTE — Progress Notes (Signed)
INFECTIOUS DISEASE PROGRESS NOTE  ID: [name] is a 66 y.o. male with   Principal Problem:  *Pleural effusion Active Problems:  Chronic systolic congestive heart failure  Esophageal stricture  Septic embolism  Candidal endocarditis  Malnutrition  Subjective: Without complaints  Abtx:  Anti-infectives     Start     Dose/Rate Route Frequency Ordered Stop   08/27/11 2200   micafungin (MYCAMINE) 150 mg in sodium chloride 0.9 % 100 mL IVPB        150 mg 100 mL/hr over 1 Hours Intravenous Every 24 hours 08/27/11 2031            Medications:  Scheduled:   . albuterol  2 puff Inhalation Q6H  . aspirin EC  81 mg Oral Daily  . calcium carbonate  1 tablet Oral TID WC  . enoxaparin (LOVENOX) injection  40 mg Subcutaneous Q24H  . feeding supplement  237 mL Oral BID BM  . furosemide  40 mg Oral Daily  . ibuprofen  600 mg Oral QID  . metoprolol  25 mg Oral BID  . micafungin (MYCAMINE) IV  150 mg Intravenous Q24H  . mulitivitamin with minerals  1 tablet Oral Daily  . pantoprazole  40 mg Oral Q1200  . sodium chloride  3 mL Intravenous Q12H  . traZODone  50 mg Oral Once  . traZODone  50 mg Oral QHS  . vitamin C  250 mg Oral Daily  . DISCONTD: sodium chloride  3 mL Intravenous Q12H    Objective: Vital signs in last 24 hours: Temp:  [97.8 F (36.6 C)-99.3 F (37.4 C)] 97.8 F (36.6 C) (05/31 1400) Pulse Rate:  [87-122] 90  (05/31 1400) Resp:  [17-18] 17  (05/31 1400) BP: (107-130)/(69-81) 107/69 mmHg (05/31 1400) SpO2:  [98 %-99 %] 99 % (05/31 1400) Weight:  [82.373 kg (181 lb 9.6 oz)] 82.373 kg (181 lb 9.6 oz) (05/31 0500)   General appearance: alert, cooperative and no distress up out of bed, just finnished shaving with PT  Lab Results  Basename 08/30/11 0434 08/29/11 1105 08/28/11 0600  WBC 8.1 -- 11.4*  HGB 8.9* -- 9.0*  HCT 28.8* -- 28.3*  NA 132* 130* --  K 4.3 5.0 --  CL 92* 91* --  CO2 30 27 --  BUN 20 16 --  CREATININE 0.71 0.75 --  GLU -- --  --   Liver Panel  Basename 08/28/11 0600 08/27/11 2100  PROT 7.8 7.9  ALBUMIN 2.1* 2.2*  AST 26 27  ALT 13 15  ALKPHOS 95 97  BILITOT 0.3 0.4  BILIDIR 0.1 --  IBILI 0.2* --   Sedimentation Rate No results found for this basename: ESRSEDRATE in the last 72 hours C-Reactive Protein No results found for this basename: CRP:2 in the last 72 hours  Microbiology: Recent Results (from the past 240 hour(s))  CULTURE, BLOOD (ROUTINE X 2)     Status: Normal   Collection Time   08/22/11  2:20 PM      Component Value Range Status Comment   Specimen Description BLOOD RIGHT HAND   Final    Special Requests     Final    Value: BOTTLES DRAWN AEROBIC AND ANAEROBIC AERO 10CC ANA 8CC   Culture  Setup Time [id number]   Final    Culture NO GROWTH 5 DAYS   Final    Report Status 08/28/2011 FINAL   Final   CULTURE, BLOOD (ROUTINE X 2)     Status: Normal  Collection Time   08/22/11  3:15 PM      Component Value Range Status Comment   Specimen Description BLOOD HAND RIGHT   Final    Special Requests BOTTLES DRAWN AEROBIC ONLY 3CC   Final    Culture  Setup Time [id number]   Final    Culture NO GROWTH 5 DAYS   Final    Report Status 08/28/2011 FINAL   Final   MRSA PCR SCREENING     Status: Normal   Collection Time   08/27/11  5:41 PM      Component Value Range Status Comment   MRSA by PCR NEGATIVE  NEGATIVE  Final   CULTURE, BLOOD (ROUTINE X 2)     Status: Normal (Preliminary result)   Collection Time   08/27/11  9:53 PM      Component Value Range Status Comment   Specimen Description BLOOD LEFT FOOT   Final    Special Requests BOTTLES DRAWN AEROBIC AND ANAEROBIC 5CC   Final    Culture  Setup Time [id number]   Final    Culture     Final    Value:        BLOOD CULTURE RECEIVED NO GROWTH TO DATE CULTURE WILL BE HELD FOR 5 DAYS BEFORE ISSUING A FINAL NEGATIVE REPORT   Report Status PENDING   Incomplete   CULTURE, BLOOD (ROUTINE X 2)     Status: Normal (Preliminary result)   Collection  Time   08/27/11  9:59 PM      Component Value Range Status Comment   Specimen Description BLOOD RIGHT FOOT   Final    Special Requests BOTTLES DRAWN AEROBIC AND ANAEROBIC 5CC   Final    Culture  Setup Time [id number]   Final    Culture     Final    Value:        BLOOD CULTURE RECEIVED NO GROWTH TO DATE CULTURE WILL BE HELD FOR 5 DAYS BEFORE ISSUING A FINAL NEGATIVE REPORT   Report Status PENDING   Incomplete   BODY FLUID CULTURE     Status: Normal (Preliminary result)   Collection Time   08/28/11  3:54 PM      Component Value Range Status Comment   Specimen Description PLEURAL FLUID RIGHT   Final    Special Requests SYR   Final    Gram Stain     Final    Value: RARE WBC PRESENT,BOTH PMN AND MONONUCLEAR     NO ORGANISMS SEEN   Culture NO GROWTH 1 DAY   Final    Report Status PENDING   Incomplete   FUNGUS CULTURE W SMEAR     Status: Normal (Preliminary result)   Collection Time   08/28/11  3:55 PM      Component Value Range Status Comment   Specimen Description PLEURAL FLUID RIGHT   Final    Special Requests SYR   Final    Fungal Smear NO YEAST OR FUNGAL ELEMENTS SEEN   Final    Culture CULTURE IN PROGRESS FOR FOUR WEEKS   Final    Report Status PENDING   Incomplete   ANAEROBIC CULTURE     Status: Normal (Preliminary result)   Collection Time   08/28/11  3:55 PM      Component Value Range Status Comment   Specimen Description PLEURAL FLUID RIGHT   Final    Special Requests SYR   Final    Gram Stain  Final    Value: RARE WBC PRESENT,BOTH PMN AND MONONUCLEAR     NO ORGANISMS SEEN   Culture     Final    Value: NO ANAEROBES ISOLATED; CULTURE IN PROGRESS FOR 5 DAYS   Report Status PENDING   Incomplete   AFB CULTURE WITH SMEAR     Status: Normal (Preliminary result)   Collection Time   08/28/11  3:56 PM      Component Value Range Status Comment   Specimen Description PLEURAL FLUID RIGHT   Final    Special Requests SYR   Final    ACID FAST SMEAR NO ACID FAST  BACILLI SEEN   Final    Culture     Final    Value: CULTURE WILL BE EXAMINED FOR 6 WEEKS BEFORE ISSUING A FINAL REPORT   Report Status PENDING   Incomplete     Studies/Results: Dg Chest 1 View  08/28/2011  *RADIOLOGY REPORT*  Clinical Data: 66 year old male with pulmonary emboli.  Status post thoracentesis.  CHEST - 1 VIEW  Comparison: 08/27/2011 and earlier.  Findings: Stable right PICC line.  Stable cardiac size and mediastinal contours.  No pneumothorax.  Small residual right pleural effusion with superimposed patchy and confluent airspace disease at the right lung base.  Streaky opacity at the left base. Stable lung apices. Visualized tracheal air column is within normal limits.  IMPRESSION: No pneumothorax following thoracentesis.  Small right pleural effusion and right greater than left airspace disease as the sequelae of pulmonary emboli.  Original Report Authenticated By: Harley Hallmark, M.D.   Dg Chest Port 1 View  08/29/2011  *RADIOLOGY REPORT*  Clinical Data: Cough, shortness of breath.  PORTABLE CHEST - 1 VIEW  Comparison: 08/28/2011  Findings: Right PICC line remains in place with the tip in the right atrium, unchanged.  Prior CABG.  Consolidation in the right lower lobe, unchanged.  No focal opacity on the left.  Heart is mildly enlarged.  IMPRESSION: Continued right lower lobe consolidation.  Improving opacities in the left lung.  Mild cardiomegaly.  Original Report Authenticated By: Cyndie Chime, M.D.   US Thoracentesis Asp Pleural Space W/img Guide  08/28/2011  *RADIOLOGY REPORT*  Clinical Data:  Patient with pleuritic chest pain, recent finding of pulmonary embolus, right-sided pleural effusion  ULTRASOUND GUIDED right THORACENTESIS  Comparison:  None  An ultrasound guided thoracentesis was thoroughly discussed with the patient and questions answered.  The benefits, risks, alternatives and complications were also discussed.  The patient understands and wishes to proceed with the  procedure.  Written consent was obtained.  Ultrasound was performed to localize and mark an adequate pocket of fluid in the right chest.  The area was then prepped and draped in the normal sterile fashion.  1% Lidocaine was used for local anesthesia.  Under ultrasound guidance a 19 gauge Yueh catheter was introduced.  Thoracentesis was performed.  The catheter was removed and a dressing applied.  Complications:  The patient had mild vasovagal response post procedure with a slight drop in his blood pressure to 98/72. He recovered well after and had no loss of consciousness. BP was then back to baseline at 119/72  Findings: A total of approximately 240 ml of blood tinged serous fluid was removed. A fluid sample was sent for laboratory analysis.  IMPRESSION: Successful ultrasound guided right thoracentesis yielding 240 ml of pleural fluid.  Read by Brayton El PA-C  Original Report Authenticated By: Reola Calkins, M.D.  Assessment/Plan: Pulmonary Infarct/septic emboli  Fungemia  Would-  Continue micafungin (Day 19 antifungal therapy)  Await sensi of yeast (sent reference lab in [place])  Await studies from thoracentesis  Watch pacer pocket site. Cont to watch for need for I & D He appears to be improving (has defervesced, ambulating).   Johny Sax Infectious Diseases [phone] 08/30/2011, 2:46 PM   LOS: 3 days

## 2011-08-30 NOTE — Progress Notes (Signed)
OT Discharge Note  Patient is being discharged from OT services secondary to:    Goals met and no further therapy needs identified.  Please see latest Therapy Progress Note for current level of functioning and progress toward goals.  Progress and discharge plan and discussed with patient/caregiver and they    Agree    [name], [name] Brynn   OTR/L Pager: [phone] Office: [phone] .     

## 2011-08-31 ENCOUNTER — Inpatient Hospital Stay (HOSPITAL_COMMUNITY): Payer: Medicare Other

## 2011-08-31 LAB — CBC
HCT: 26.6 % — ABNORMAL LOW (ref 39.0–52.0)
Hemoglobin: 8.2 g/dL — ABNORMAL LOW (ref 13.0–17.0)
MCV: 81.1 fL (ref 78.0–100.0)
MCV: 80.4 fL (ref 78.0–100.0)
Platelets: 327 10*3/uL (ref 150–400)
RBC: 3.28 MIL/uL — ABNORMAL LOW (ref 4.22–5.81)
RBC: 3.47 MIL/uL — ABNORMAL LOW (ref 4.22–5.81)
RDW: 15.8 % — ABNORMAL HIGH (ref 11.5–15.5)
RDW: 15.8 % — ABNORMAL HIGH (ref 11.5–15.5)
WBC: 6.8 10*3/uL (ref 4.0–10.5)
WBC: 8.8 10*3/uL (ref 4.0–10.5)

## 2011-08-31 LAB — DIFFERENTIAL
Basophils Absolute: 0.1 10*3/uL (ref 0.0–0.1)
Eosinophils Relative: 2 % (ref 0–5)
Lymphocytes Relative: 16 % (ref 12–46)
Lymphs Abs: 1.4 10*3/uL (ref 0.7–4.0)
Neutro Abs: 6.5 10*3/uL (ref 1.7–7.7)

## 2011-08-31 LAB — BASIC METABOLIC PANEL
BUN: 16 mg/dL (ref 6–23)
CO2: 30 mEq/L (ref 19–32)
Chloride: 94 mEq/L — ABNORMAL LOW (ref 96–112)
GFR calc Af Amer: >90 mL/min (ref >90)
Potassium: 4.4 mEq/L (ref 3.5–5.1)

## 2011-08-31 LAB — COMPREHENSIVE METABOLIC PANEL
ALT: 21 U/L (ref 0–53)
AST: 36 U/L (ref 0–37)
Alkaline Phosphatase: 155 U/L — ABNORMAL HIGH (ref 39–117)
CO2: 28 mEq/L (ref 19–32)
Chloride: 92 mEq/L — ABNORMAL LOW (ref 96–112)
GFR calc Af Amer: >90 mL/min (ref >90)
GFR calc non Af Amer: >90 mL/min (ref >90)
Glucose, Bld: 158 mg/dL — ABNORMAL HIGH (ref 70–99)
Potassium: 4.4 mEq/L (ref 3.5–5.1)
Sodium: 130 mEq/L — ABNORMAL LOW (ref 135–145)
Total Bilirubin: 0.2 mg/dL — ABNORMAL LOW (ref 0.3–1.2)

## 2011-08-31 LAB — URINALYSIS, ROUTINE W REFLEX MICROSCOPIC
Glucose, UA: NEGATIVE mg/dL
Hgb urine dipstick: NEGATIVE
Ketones, ur: NEGATIVE mg/dL
Leukocytes, UA: NEGATIVE
Protein, ur: NEGATIVE mg/dL
Urobilinogen, UA: 1 mg/dL (ref 0.0–1.0)

## 2011-08-31 MED ORDER — PIPERACILLIN-TAZOBACTAM 3.375 G IVPB
3.3750 g | Freq: Three times a day (TID) | INTRAVENOUS | Status: DC
  Administered 2011-08-31 – 2011-09-04 (×12): 3.375 g via INTRAVENOUS
  Filled 2011-08-31 (×17): qty 50

## 2011-08-31 MED ORDER — IPRATROPIUM BROMIDE 0.02 % IN SOLN
0.5000 mg | RESPIRATORY_TRACT | Status: DC | PRN
  Administered 2011-09-06: 0.5 mg via RESPIRATORY_TRACT
  Filled 2011-08-31 (×2): qty 2.5

## 2011-08-31 MED ORDER — GI COCKTAIL ~~LOC~~
30.0000 mL | Freq: Once | ORAL | Status: AC
  Administered 2011-08-31: 30 mL via ORAL
  Filled 2011-08-31: qty 30

## 2011-08-31 MED ORDER — DOCUSATE SODIUM 100 MG PO CAPS
100.0000 mg | ORAL_CAPSULE | Freq: Two times a day (BID) | ORAL | Status: DC
  Administered 2011-09-01 – 2011-09-14 (×12): 100 mg via ORAL
  Filled 2011-08-31 (×32): qty 1

## 2011-08-31 MED ORDER — VANCOMYCIN HCL IN DEXTROSE 1-5 GM/200ML-% IV SOLN
1000.0000 mg | Freq: Three times a day (TID) | INTRAVENOUS | Status: DC
  Administered 2011-08-31 – 2011-09-02 (×5): 1000 mg via INTRAVENOUS
  Filled 2011-08-31 (×7): qty 200

## 2011-08-31 MED ORDER — LEVALBUTEROL HCL 0.63 MG/3ML IN NEBU
0.6300 mg | INHALATION_SOLUTION | Freq: Four times a day (QID) | RESPIRATORY_TRACT | Status: DC | PRN
  Administered 2011-08-31: 0.63 mg via RESPIRATORY_TRACT
  Filled 2011-08-31 (×2): qty 3

## 2011-08-31 NOTE — Progress Notes (Addendum)
S: Patient feels better now. Less SOB or cough. Comfortably resting in the bed.     Patient mentioned that he has mild epigastric pain after his lunch. No heart burn or indigestion. No radiation.     No chest pain or pressure  O: General: chronic ill appearing      Abd: soft. Epigastric area tenderness noted. No rebound tenderness. No guarding.  A/P  - I have updated to patient and his family the treatment plan - I have reviewed his medications and he is on Ibuprofen 600 mg po 4 times daily  - will hold afternoon ibuprofen dose - will give him one dose of GI cocktail - reviewed all labs from this afternoon.  - low threshold for transferring to SDU/ICU if worsening of SOB or hypotensive.

## 2011-08-31 NOTE — Progress Notes (Addendum)
S: Nurse reports that patient does not feel good. Endorse increased SOB.      O: T 99.3 (T 102.7 at 1141 am), P 114, RR 32, BP 106/68            General: chronic ill appearing.        Neck: No JVD       Lungs:  Bilateral lungs sounds diminished, R>L. A few rales noted on right sided lung. No wheezing or rhonchi noted.        Heart: RRR. Left 3rd -4th sternal boarder 3-4/6 systolic murmur noted with radiation to left 2nd intercostal space.                  No murmur noted on right 2nd intercostal space or Apex. No R/G.       Abd: soft, BS x 4. 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 He has not had temp over 101 since 08/23/11 though he would have intermittent low grade temp.  With his new fever of 102.7 and increased SOB, we will need to evaluate for possible of new infections. - will obtain CBC, CMP, Blood culture, lactic acid, UA and urine culture - will repeat his chest X ray - will call ID once we have chest X ray result. - He meets sepsis criteria now with elevated Temp, tachycardia and Tachypnea and known source of infection. Should his BP drops, low threshold transfer to SDU or ICU.  - discuss with patient and his wife about above treatment plan. They agree with it - above plan was also discussed with resident Dr. Dorthula Rue and attending Dr. Meredith Pel.   Addendum at 3pm. PCXR showed right side consolidation which is slightly worse than prior CXR.  Consolidation could be pleural effusion or PNA, give his new fever today, likely HCAP. I have called Dr. Ninetta Lights and discussed with him.  - will start Vancomycin and Zosyn once blood culture and UA/UCx collected. - I have updated to patient and his wife.

## 2011-08-31 NOTE — Progress Notes (Signed)
Subjective: Patient does not feel well today. Very weak and cold. When he woke up this morning he was feeling better. But then after breakfast just not feeling right. Denies any chest pain, SOB, abdominal pain or trouble with urination.   Objective: Vital signs in last 24 hours: Filed Vitals:   08/30/11 2100 08/30/11 2114 08/31/11 0109 08/31/11 0500  BP: 114/71   129/73  Pulse: 97   107  Temp: 98.5 F (36.9 C)   99.1 F (37.3 C)  TempSrc: Oral   Oral  Resp: 18   18  Height:      Weight:      SpO2: 97% 95% 98% 95%   Weight change:   Intake/Output Summary (Last 24 hours) at 08/31/11 0657 Last data filed at 08/31/11 0500  Gross per 24 hour  Intake    600 ml  Output   1150 ml  Net   -550 ml   Physical Exam: Constitutional: Vital signs reviewed.  Patient is in mild distress and cooperative with exam. Alert and oriented x3.  Neck: Supple,  Cardiovascular: RRR, S1 normal, S2 normal, no MRG, pulses symmetric and intact bilaterally Pulmonary/Chest: CTAB, no wheezes, rales, or rhonchi. Pacemaker pocket is swollen but non tender. No fluctuance, warmth, tenderness, erythema, or purulent drainage Abdominal: Soft. Non-tender, non-distended, bowel sounds are normal, no masses,  Neurological: A&O x3,  no focal motor deficit, sensory intact to light touch bilaterally.  Skin: Warm, dry and intact. No rash, cyanosis, or clubbing.   Lab Results: Basic Metabolic Panel:  Lab 08/31/11 1610 08/30/11 0434  NA 134* 132*  K 4.4 4.3  CL 94* 92*  CO2 30 30  GLUCOSE 92 96  BUN 16 20  CREATININE 0.68 0.71  CALCIUM 9.1 9.5  MG -- --  PHOS -- --   Liver Function Tests:  Lab 08/28/11 0600 08/27/11 2100  AST 26 27  ALT 13 15  ALKPHOS 95 97  BILITOT 0.3 0.4  PROT 7.8 7.9  ALBUMIN 2.1* 2.2*    Lab 08/29/11 1105  LIPASE --  AMYLASE 49   CBC:  Lab 08/31/11 0449 08/30/11 0434 08/27/11 2100  WBC 6.8 8.1 --  NEUTROABS -- 5.8 9.9*  HGB 8.2* 8.9* --  HCT 26.6* 28.8* --  MCV 81.1 81.6 --   PLT 302 293 --   Cardiac Enzymes:  Lab 08/30/11 0434 08/29/11 2014 08/29/11 1105  CKTOTAL 9 9 16   CKMB 1.3 1.2 1.0  CKMBINDEX -- -- --  TROPONINI <0.30 <0.30 <0.30   BNP:  Lab 08/27/11 2100  PROBNP 1011.0*     Micro Results: Recent Results (from the past 240 hour(s))  CULTURE, BLOOD (ROUTINE X 2)     Status: Normal   Collection Time   08/22/11  2:20 PM      Component Value Range Status Comment   Specimen Description BLOOD RIGHT HAND   Final    Special Requests     Final    Value: BOTTLES DRAWN AEROBIC AND ANAEROBIC AERO 10CC ANA 8CC   Culture  Setup Time [id number]   Final    Culture NO GROWTH 5 DAYS   Final    Report Status 08/28/2011 FINAL   Final   CULTURE, BLOOD (ROUTINE X 2)     Status: Normal   Collection Time   08/22/11  3:15 PM      Component Value Range Status Comment   Specimen Description BLOOD HAND RIGHT   Final    Special Requests BOTTLES DRAWN AEROBIC  ONLY [facility]   Final    Culture  Setup Time [id number]   Final    Culture NO GROWTH 5 DAYS   Final    Report Status 08/28/2011 FINAL   Final   MRSA PCR SCREENING     Status: Normal   Collection Time   08/27/11  5:41 PM      Component Value Range Status Comment   MRSA by PCR NEGATIVE  NEGATIVE  Final   CULTURE, BLOOD (ROUTINE X 2)     Status: Normal (Preliminary result)   Collection Time   08/27/11  9:53 PM      Component Value Range Status Comment   Specimen Description BLOOD LEFT FOOT   Final    Special Requests BOTTLES DRAWN AEROBIC AND ANAEROBIC 5CC   Final    Culture  Setup Time [id number]   Final    Culture     Final    Value:        BLOOD CULTURE RECEIVED NO GROWTH TO DATE CULTURE WILL BE HELD FOR 5 DAYS BEFORE ISSUING A FINAL NEGATIVE REPORT   Report Status PENDING   Incomplete   CULTURE, BLOOD (ROUTINE X 2)     Status: Normal (Preliminary result)   Collection Time   08/27/11  9:59 PM      Component Value Range Status Comment   Specimen Description BLOOD RIGHT FOOT   Final    Special Requests  BOTTLES DRAWN AEROBIC AND ANAEROBIC 5CC   Final    Culture  Setup Time [id number]   Final    Culture     Final    Value:        BLOOD CULTURE RECEIVED NO GROWTH TO DATE CULTURE WILL BE HELD FOR 5 DAYS BEFORE ISSUING A FINAL NEGATIVE REPORT   Report Status PENDING   Incomplete   BODY FLUID CULTURE     Status: Normal (Preliminary result)   Collection Time   08/28/11  3:54 PM      Component Value Range Status Comment   Specimen Description PLEURAL FLUID RIGHT   Final    Special Requests SYR   Final    Gram Stain     Final    Value: RARE WBC PRESENT,BOTH PMN AND MONONUCLEAR     NO ORGANISMS SEEN   Culture NO GROWTH 1 DAY   Final    Report Status PENDING   Incomplete   FUNGUS CULTURE W SMEAR     Status: Normal (Preliminary result)   Collection Time   08/28/11  3:55 PM      Component Value Range Status Comment   Specimen Description PLEURAL FLUID RIGHT   Final    Special Requests SYR   Final    Fungal Smear NO YEAST OR FUNGAL ELEMENTS SEEN   Final    Culture CULTURE IN PROGRESS FOR FOUR WEEKS   Final    Report Status PENDING   Incomplete   ANAEROBIC CULTURE     Status: Normal (Preliminary result)   Collection Time   08/28/11  3:55 PM      Component Value Range Status Comment   Specimen Description PLEURAL FLUID RIGHT   Final    Special Requests SYR   Final    Gram Stain     Final    Value: RARE WBC PRESENT,BOTH PMN AND MONONUCLEAR     NO ORGANISMS SEEN   Culture     Final    Value: NO ANAEROBES ISOLATED; CULTURE IN  PROGRESS FOR 5 DAYS   Report Status PENDING   Incomplete   AFB CULTURE WITH SMEAR     Status: Normal (Preliminary result)   Collection Time   08/28/11  3:56 PM      Component Value Range Status Comment   Specimen Description PLEURAL FLUID RIGHT   Final    Special Requests SYR   Final    ACID FAST SMEAR NO ACID FAST BACILLI SEEN   Final    Culture     Final    Value: CULTURE WILL BE EXAMINED FOR 6 WEEKS BEFORE ISSUING A FINAL REPORT   Report Status  PENDING   Incomplete    Studies/Results: Dg Chest Port 1 View  08/29/2011  *RADIOLOGY REPORT*  Clinical Data: Cough, shortness of breath.  PORTABLE CHEST - 1 VIEW  Comparison: 08/28/2011  Findings: Right PICC line remains in place with the tip in the right atrium, unchanged.  Prior CABG.  Consolidation in the right lower lobe, unchanged.  No focal opacity on the left.  Heart is mildly enlarged.  IMPRESSION: Continued right lower lobe consolidation.  Improving opacities in the left lung.  Mild cardiomegaly.  Original Report Authenticated By: Cyndie Chime, M.D.   Medications: I have reviewed the patient's current medications. Scheduled Meds:   . albuterol  2 puff Inhalation Q6H  . aspirin EC  81 mg Oral Daily  . calcium carbonate  1 tablet Oral TID WC  . enoxaparin (LOVENOX) injection  40 mg Subcutaneous Q24H  . feeding supplement  237 mL Oral BID BM  . furosemide  20 mg Oral Once  . furosemide  60 mg Oral Daily  . ibuprofen  600 mg Oral QID  . metoprolol  50 mg Oral BID  . micafungin (MYCAMINE) IV  150 mg Intravenous Q24H  . mulitivitamin with minerals  1 tablet Oral Daily  . pantoprazole  40 mg Oral Q1200  . sodium chloride  3 mL Intravenous Q12H  . traZODone  50 mg Oral QHS  . vitamin C  250 mg Oral Daily  . DISCONTD: furosemide  40 mg Oral Daily  . DISCONTD: metoprolol  25 mg Oral BID  . DISCONTD: sodium chloride  3 mL Intravenous Q12H   Continuous Infusions:  PRN Meds:.acetaminophen, clonazePAM, oxyCODONE-acetaminophen, polyethylene glycol, sodium chloride, DISCONTD: sodium chloride, DISCONTD: sodium chloride Assessment/Plan:  Assessment & Plan by Problem: 66 y/o m with pmh of recent prolonged and complicated hospitalization for fungal infection of the pacer leads leading to septic pulmonary emboli and fungemia causing septic shock is was admitted for chest pain and dyspnea is secondary to pleural effusion. Received diagnostic and therapeutic thoracentesis 08/28/2011. He had some  dyspnea on the evening of 5/29-5/30 however this resolved with albuterol and furosemide.  1. Pulmonary effusion/pulmonary embolism/pulmonary infarction: CTA of chest showed mildly increased right pleural effusion without significant changes from 08/18/2011 with persistent bilateral PE with RLL thrombus and RLL extensive airspace disease, and small chest hematoma with trace gas. Chest pain shortness of breath likely pleuritic in nature secondary to pleural effusion. 08/28/2011 received ultrasound guided diagnostic and therapeutic thoracentesis to evaluate pleural fluid. Initial tap was blood tinged fluid. This is most consistent with his known pulmonary infarction. The initial results show a clearly exudative effusion with fluid LDH> serum LDH. Thus far no organism seen on smears or culture. Initial concern yesterday was for pneumothorax versus hemothorax post procedure. Portable chest x-ray showed no evidence of either pneumothorax or hemothorax. Nor was there increased consolidation  from prior that would be consistent with reexpansion pulmonary edema or pneumonia. Overall increase shortness of breath likely secondary to combination of low functional reserve, incomplete expansion of right lung post procedure, and possible CHF. Treatment have focused on increasing furosemide and scheduled albuterol nebs to recruit as many alveoli as possible.  -- Follow AFB culture an stain fungal culture and stain, anaerobic culture and conventional fluid culture -- Will treat with oxycodone-acetaminophen PRN and scheduled ibuprofen (he is not be discharged on NSAIDS long term due to cardiac risk) -- will not anticoagulate at therapeutic doses due to risk of hemorrhage and the emboli are not thromboembolic in nature.  -- Continue lasix 60 mg PO daily  -- Will continue 2 puffs albuterol QID  2. Acute on chronic systolic congestive heart failure due to CAD and ischemic cardiomyopathy - EF 30% with mild LVH and diffuse  hypokinesis, proBNP elevated but decreased from prior admission. Shortness of breath is likely secondary to acute on chronic systolic CHF. He has lost a lot of pulmonary reserve, do to pulmonary infarction. 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 His increased shortness of breath the night before last and is likely secondary to fluid accumulation. This resolved with increased furosemide.Increase furosemide to 60 mg as well as Metoprolol to 50 mg bid yesterday . - continue secondary prevention with aspirin, beta blocker in addition to daily lasix as noted above. Of Note pt has ACEI and ARB allergy.  - has pending referral to cardiac rehab  - new pacemaker to be placed upon resolution of infection  - monitor I/Os   3. Persistent Fever with increased leukocytosis- Improved. the most likely cause of persistent fever is his known persistent fungal infection that is gradually resolving. Less likely causes are fungal infection resistant to mycofungin (unlikely), or necrotic pulmonary process. UTI was clean and cultures taken yesterday have not yet resulted. Abscess of the pacer pocket is a possibility, though less likely as it does not appear clinically to be infected. Blood cultures negative to date. 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 Will plan on continuing micafungin for now. The patient has done very well over the past couple days. We will continue to monitor for fever, but will expect that occasionally these will arise - monitor for fevers  - Will schedule ibuprofen - Follow blood cultures x2  - Continue mycofungin for total of 6 weeks per recent discharge plans  - will defer additional antibiotics pending blood culture results  - Will defer ultrasound of the pacer pocket until evaluation of pulmonary fluid complete  4. GERD/Candida Esophagitis - stable, will continue with continue micafungin and PPI.   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 Tachycardia- In setting of fever, anxiety, active infection and chest pain. Will not increase beta blocker at this time as he is are feeling fatigued and may be somewhat decompensated. Metoprolol to 50 mg BID  7. Anxiety and insomnia- [name] slept much better last night with the addition of trazodone. . Will continue clonazepam and trazodone for sleep. Patient is to maintain good sleep hygine today and not nap during the day.   8. Normocytic anemia - stable Hgb from recent hospitalization range 8-9, previously 14 in March 2013. His anemia panel demonstrated: Iron = 21, Sat ratio = 9, TIBC =243, Ferritin = 597, Retic count =1.9, FOBT  negative. Peripheral smear revealed mild anemia with normal cell morphology. Findings consistent with Anemia of Chronic Disease given that TIBC is normal (usually elevated in Iron Deficiency Anemia) and ferritin high although could still be a component of acute phase reactant.  -con to monitor CBC   9. Hyponatremia: Mild and improving with furosemide. Likely secondary to a CHF. -cont to montior   10. 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 We will continue allowing the patient to eat ad lib. and continue strawberry clinical strength Ensure.  DVT prophylaxis - lovenox 40    LOS: 4 days   Verdell Dykman 08/31/2011, 6:57 AM

## 2011-08-31 NOTE — Progress Notes (Addendum)
INFECTIOUS DISEASE PROGRESS NOTE  ID: [name] is a 66 y.o. male with   Principal Problem:  *Pleural effusion Active Problems:  Chronic systolic congestive heart failure  Esophageal stricture  Septic embolism  Candidal endocarditis  Malnutrition  Subjective: Without complaints- no diarrhea (pt in bathroom), no cough. Wife anxious that pt is anxious, has less energy.   Abtx:  Anti-infectives     Start     Dose/Rate Route Frequency Ordered Stop   08/31/11 1600   vancomycin (VANCOCIN) IVPB 1000 mg/200 mL premix        1,000 mg 200 mL/hr over 60 Minutes Intravenous Every 8 hours 08/31/11 1527     08/31/11 1600  piperacillin-tazobactam (ZOSYN) IVPB 3.375 g       3.375 g 12.5 mL/hr over 240 Minutes Intravenous 3 times per day 08/31/11 1527     08/27/11 2200   micafungin (MYCAMINE) 150 mg in sodium chloride 0.9 % 100 mL IVPB        150 mg 100 mL/hr over 1 Hours Intravenous Every 24 hours 08/27/11 2031            Medications:  Scheduled:   . albuterol  2 puff Inhalation Q6H  . aspirin EC  81 mg Oral Daily  . calcium carbonate  1 tablet Oral TID WC  . docusate sodium  100 mg Oral BID  . enoxaparin (LOVENOX) injection  40 mg Subcutaneous Q24H  . feeding supplement  237 mL Oral BID BM  . furosemide  20 mg Oral Once  . furosemide  60 mg Oral Daily  . ibuprofen  600 mg Oral QID  . metoprolol  50 mg Oral BID  . micafungin (MYCAMINE) IV  150 mg Intravenous Q24H  . mulitivitamin with minerals  1 tablet Oral Daily  . pantoprazole  40 mg Oral Q1200  . piperacillin-tazobactam (ZOSYN)  IV  3.375 g Intravenous Q8H  . sodium chloride  3 mL Intravenous Q12H  . traZODone  50 mg Oral QHS  . vancomycin  1,000 mg Intravenous Q8H  . vitamin C  250 mg Oral Daily  . DISCONTD: furosemide  40 mg Oral Daily  . DISCONTD: metoprolol  25 mg Oral BID    Objective: Vital signs in last 24 hours: Temp:  [98.5 F (36.9 C)-102.7 F (39.3 C)] 98.5 F (36.9 C) (06/01 1430) Pulse  Rate:  [91-128] 91  (06/01 1430) Resp:  [18-32] 20  (06/01 1430) BP: (106-142)/(68-79) 142/75 mmHg (06/01 1430) SpO2:  [95 %-99 %] 96 % (06/01 1430)   General appearance: alert, cooperative and no distress Resp: clear to auscultation bilaterally Cardio: regular rate and rhythm GI: normal findings: bowel sounds normal and soft, non-tender Extremities: RUE PIC is clean, non-tender, no d/c.   Lab Results  [facility] 08/31/11 0449 08/30/11 0434  WBC 6.8 8.1  HGB 8.2* 8.9*  HCT 26.6* 28.8*  NA 134* 132*  K 4.4 4.3  CL 94* 92*  CO2 30 30  BUN 16 20  CREATININE 0.68 0.71  GLU -- --   Liver Panel No results found for this basename: PROT:2,ALBUMIN:2,AST:2,ALT:2,ALKPHOS:2,BILITOT:2,BILIDIR:2,IBILI:2 in the last 72 hours Sedimentation Rate No results found for this basename: ESRSEDRATE in the last 72 hours C-Reactive Protein No results found for this basename: CRP:2 in the last 72 hours  Microbiology: Recent Results (from the past 240 hour(s))  CULTURE, BLOOD (ROUTINE X 2)     Status: Normal   Collection Time   08/22/11  2:20 PM  Component Value Range Status Comment   Specimen Description BLOOD RIGHT HAND   Final    Special Requests     Final    Value: BOTTLES DRAWN AEROBIC AND ANAEROBIC AERO 10CC ANA 8CC   Culture  Setup Time [id number]   Final    Culture NO GROWTH 5 DAYS   Final    Report Status 08/28/2011 FINAL   Final   CULTURE, BLOOD (ROUTINE X 2)     Status: Normal   Collection Time   08/22/11  3:15 PM      Component Value Range Status Comment   Specimen Description BLOOD HAND RIGHT   Final    Special Requests BOTTLES DRAWN AEROBIC ONLY 3CC   Final    Culture  Setup Time [id number]   Final    Culture NO GROWTH 5 DAYS   Final    Report Status 08/28/2011 FINAL   Final   MRSA PCR SCREENING     Status: Normal   Collection Time   08/27/11  5:41 PM      Component Value Range Status Comment   MRSA by PCR NEGATIVE  NEGATIVE  Final   CULTURE, BLOOD (ROUTINE X 2)      Status: Normal (Preliminary result)   Collection Time   08/27/11  9:53 PM      Component Value Range Status Comment   Specimen Description BLOOD LEFT FOOT   Final    Special Requests BOTTLES DRAWN AEROBIC AND ANAEROBIC 5CC   Final    Culture  Setup Time [id number]   Final    Culture     Final    Value:        BLOOD CULTURE RECEIVED NO GROWTH TO DATE CULTURE WILL BE HELD FOR 5 DAYS BEFORE ISSUING A FINAL NEGATIVE REPORT   Report Status PENDING   Incomplete   CULTURE, BLOOD (ROUTINE X 2)     Status: Normal (Preliminary result)   Collection Time   08/27/11  9:59 PM      Component Value Range Status Comment   Specimen Description BLOOD RIGHT FOOT   Final    Special Requests BOTTLES DRAWN AEROBIC AND ANAEROBIC 5CC   Final    Culture  Setup Time [id number]   Final    Culture     Final    Value:        BLOOD CULTURE RECEIVED NO GROWTH TO DATE CULTURE WILL BE HELD FOR 5 DAYS BEFORE ISSUING A FINAL NEGATIVE REPORT   Report Status PENDING   Incomplete   BODY FLUID CULTURE     Status: Normal (Preliminary result)   Collection Time   08/28/11  3:54 PM      Component Value Range Status Comment   Specimen Description PLEURAL FLUID RIGHT   Final    Special Requests SYR   Final    Gram Stain     Final    Value: RARE WBC PRESENT,BOTH PMN AND MONONUCLEAR     NO ORGANISMS SEEN   Culture NO GROWTH 2 DAYS   Final    Report Status PENDING   Incomplete   FUNGUS CULTURE W SMEAR     Status: Normal (Preliminary result)   Collection Time   08/28/11  3:55 PM      Component Value Range Status Comment   Specimen Description PLEURAL FLUID RIGHT   Final    Special Requests SYR   Final    Fungal Smear NO YEAST OR FUNGAL ELEMENTS SEEN  Final    Culture CULTURE IN PROGRESS FOR FOUR WEEKS   Final    Report Status PENDING   Incomplete   ANAEROBIC CULTURE     Status: Normal (Preliminary result)   Collection Time   08/28/11  3:55 PM      Component Value Range Status Comment   Specimen Description  PLEURAL FLUID RIGHT   Final    Special Requests SYR   Final    Gram Stain     Final    Value: RARE WBC PRESENT,BOTH PMN AND MONONUCLEAR     NO ORGANISMS SEEN   Culture     Final    Value: NO ANAEROBES ISOLATED; CULTURE IN PROGRESS FOR 5 DAYS   Report Status PENDING   Incomplete   AFB CULTURE WITH SMEAR     Status: Normal (Preliminary result)   Collection Time   08/28/11  3:56 PM      Component Value Range Status Comment   Specimen Description PLEURAL FLUID RIGHT   Final    Special Requests SYR   Final    ACID FAST SMEAR NO ACID FAST BACILLI SEEN   Final    Culture     Final    Value: CULTURE WILL BE EXAMINED FOR 6 WEEKS BEFORE ISSUING A FINAL REPORT   Report Status PENDING   Incomplete     Studies/Results: Dg Chest Port 1 View  08/31/2011  *RADIOLOGY REPORT*  Clinical Data: Follow-up.  CHF.  PORTABLE CHEST - 1 VIEW  Comparison: 08/29/2011  Findings: Continued opacity in the right lower lung, likely pneumonia.  Heart is mildly enlarged.  Prior CABG.  No confluent opacity on the left.  Right PICC line remains in place with the tip in the right atrium.  Possible small right effusion.  IMPRESSION: Continued right lower lobe consolidation concerning for pneumonia. Possible small right effusion.  Original Report Authenticated By: Cyndie Chime, M.D.     Assessment/Plan: Pulmonary Infarct/septic emboli  Fungemia  Pacer site inflamation Would-  Continue micafungin (Day 19 antifungal therapy)  Day 1 vanco/zosyn Await sensi of yeast (sent reference lab in [place])  I reviewed his CXRs , not tremendous difference over the last 2. BCx being repeated.  No objection to treating him as HCAP but suspect that this is ongoing lung inflammation/necrosis from fungal PE,  Need to watch his former pacer site very closely, low threshold to I & D.  Consider repeat CT of his chest if fever continues  Pt wife very concerned- "could he die from this?" states pt is requesting feeding tube so he will  not die. I suggested that as long as he can take orals it is better for him to eat.  ? Ensure supplements [name] [phone] 08/31/2011, 3:50 PM   LOS: 4 days

## 2011-08-31 NOTE — Progress Notes (Signed)
Internal Medicine Attending  Date: 08/31/2011  Patient name: [name] Medical record number: [id number] Date of birth: [birth year] Age: 66 y.o. Gender: male  I saw and evaluated the patient, and discussed his care with housestaff.  Patient reports feeling more short of breath today and complains of "aching all over".  Exam is notable for bibasilar crackles; regular rhythm; soft nontender abdomen; no lower extremity edema.  Patient was febrile to 102.7 this morning.  The plan is to check a chest x-ray, culture blood and urine; would discuss with ID given his known fungal infection and the complexity of his current medical issues.
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
## 2011-09-01 ENCOUNTER — Inpatient Hospital Stay (HOSPITAL_COMMUNITY): Payer: Medicare Other

## 2011-09-01 LAB — CARDIAC PANEL(CRET KIN+CKTOT+MB+TROPI)
CK, MB: 1.1 ng/mL (ref 0.3–4.0)
Total CK: <7 U/L — ABNORMAL LOW (ref 7–232)
Troponin I: <0.3 ng/mL

## 2011-09-01 LAB — CBC
MCH: 24.6 pg — ABNORMAL LOW (ref 26.0–34.0)
MCHC: 30.8 g/dL (ref 30.0–36.0)
MCV: 79.9 fL (ref 78.0–100.0)
Platelets: 304 10*3/uL (ref 150–400)
RBC: 3.33 MIL/uL — ABNORMAL LOW (ref 4.22–5.81)
RDW: 15.7 % — ABNORMAL HIGH (ref 11.5–15.5)

## 2011-09-01 LAB — BASIC METABOLIC PANEL
BUN: 16 mg/dL (ref 6–23)
CO2: 31 mEq/L (ref 19–32)
GFR calc Af Amer: >90 mL/min (ref >90)
Glucose, Bld: 108 mg/dL — ABNORMAL HIGH (ref 70–99)
Sodium: 132 mEq/L — ABNORMAL LOW (ref 135–145)

## 2011-09-01 LAB — BODY FLUID CULTURE: Culture: NO GROWTH

## 2011-09-01 LAB — PRO B NATRIURETIC PEPTIDE: Pro B Natriuretic peptide (BNP): 973.1 pg/mL — ABNORMAL HIGH (ref 0–125)

## 2011-09-01 MED ORDER — NITROGLYCERIN 0.4 MG SL SUBL
SUBLINGUAL_TABLET | SUBLINGUAL | Status: AC
  Filled 2011-09-01: qty 25

## 2011-09-01 MED ORDER — SPIRONOLACTONE 25 MG PO TABS
25.0000 mg | ORAL_TABLET | Freq: Every day | ORAL | Status: DC
  Administered 2011-09-01: 25 mg via ORAL
  Filled 2011-09-01: qty 1

## 2011-09-01 MED ORDER — IOHEXOL 300 MG/ML  SOLN
80.0000 mL | Freq: Once | INTRAMUSCULAR | Status: AC | PRN
  Administered 2011-09-01: 80 mL via INTRAVENOUS

## 2011-09-01 MED ORDER — FUROSEMIDE 10 MG/ML IJ SOLN
40.0000 mg | Freq: Once | INTRAMUSCULAR | Status: AC
  Administered 2011-09-01: 40 mg via INTRAVENOUS
  Filled 2011-09-01: qty 4

## 2011-09-01 NOTE — Progress Notes (Signed)
Called to assist with care of patient c/o left sided chest pain. Patient appears very alert and responsive. Skin is warm and dry. Heart sound regular. Lungs clear anterior but slightly tachycepnic. States that chest pain is non-radiating and tender to palpate. History was reviewed with unit RN. ECG was performed and compared to previous ECG's. No acute changes were noted. Patient c/o bo being cold. Temperature axillary is 99.7. Patient was given Tylenol prior to my arrival. Unit RN was advised to call primary care physician for re-evaluation. No RRT interventions needed at present. Patient appears to need pain control.

## 2011-09-01 NOTE — Progress Notes (Signed)
Night Float Progress Note  S: Called to pt bedside for episode of cough and L sided CP.  CP is constant pressure in L side, worse with cough, different from other pains this admission.  Started in the night, was given NTG x 2 with partial relief.  O: Filed Vitals:   09/01/11 0240  BP: 122/73  Pulse: 104  Temp: 99.7 F (37.6 C)  Resp: 26   Gen: in mild distress, breathing shallowly CV: no JVD appreciated, tachycardic, reg rate, 3/6 HSM at L lower sternal border Chest: shallow inspirations, tachypneic, clear anteriorly, coughing intermittently Ext: no edema  A/P: Seen earlier today by cross cover for fevers and "not feeling well" (see note for details).  Now called for L sided CP, cough, and SOB.  Patient feels poorly but at the time of interview is starting to settle down and breath more comfortably.  Vitals stable but slightly more tachypneic and slightly worse sats than earlier in the day.  Ddx includes cardiac CP, CHF related cough/SOB, or worsening baseline disease (fungal sepsis/embolic dz).  EKG shows LBBB and is relatively unchanged.  Weight is down overall, pt is euvolemic for the day.  Has had a few episodes over the past admissions where he improved with lasix, and his Cr/BUN are wnl at the moment. - stat cardiac enzymes x 3 - stat proBNP to help trend heart function - lasix 40mg  IV x 1 - consider abg if no improvement - transfer to SDU 2900/2600 for closer monitoring

## 2011-09-01 NOTE — Progress Notes (Signed)
Subjective: Overnight events: patient had left sided chest pain and increased shortness of breath so cardiac enzymes EKG were checked as per Dr. Danice Goltz note and he was transferred to SDU. Extra dose of 40mg  of IV lasix given.  Patient complains of very mild chest pain 1/10, much improved from 5/10 overnight. He still complains of some shortness of breath but it seems to have improved after giving 40mg  IV lasix.  Objective: Vital signs in last 24 hours: Filed Vitals:   09/01/11 0600 09/01/11 0700 09/01/11 0730 09/01/11 0909  BP: 113/69 124/65 105/62   Pulse: 98 69 98   Temp:   98.9 F (37.2 C)   TempSrc:   Oral   Resp: 26 30 27    Height:      Weight:      SpO2: 98% 97% 97% 100%   Weight change:   Intake/Output Summary (Last 24 hours) at 09/01/11 1116 Last data filed at 09/01/11 0600  Gross per 24 hour  Intake    773 ml  Output   1300 ml  Net   -527 ml   Physical Exam: Constitutional: Vital signs reviewed.  Patient is in mild distress and cooperative with exam. Alert and oriented x3.  Cardiovascular: RRR, S1 normal, S2 normal, no MRG, pulses symmetric and intact bilaterally Pulmonary/Chest: expiratory wheeze present diffusely. Pacemaker pocket is swollen but non tender. No fluctuance, warmth, tenderness, erythema, or purulent drainage Abdominal: Soft. Non-tender, non-distended, bowel sounds are normal, no masses,  Neurological: A&O x3,  no focal motor deficit, sensory intact to light touch bilaterally.  Skin: Warm, dry and intact. No rash, cyanosis, or clubbing.   Lab Results: Basic Metabolic Panel:  Lab 09/01/11 4401 08/31/11 1631  NA 132* 130*  K 4.2 4.4  CL 91* 92*  CO2 31 28  GLUCOSE 108* 158*  BUN 16 18  CREATININE 0.75 0.79  CALCIUM 9.2 9.1  MG -- --  PHOS -- --   Liver Function Tests:  Lab 08/31/11 1631 08/28/11 0600  AST 36 26  ALT 21 13  ALKPHOS 155* 95  BILITOT 0.2* 0.3  PROT 7.8 7.8  ALBUMIN 2.0* 2.1*    Lab 08/29/11 1105  LIPASE --  AMYLASE  49   CBC:  Lab 09/01/11 0500 08/31/11 1631 08/30/11 0434  WBC 9.5 8.8 --  NEUTROABS -- 6.5 5.8  HGB 8.2* 8.6* --  HCT 26.6* 27.9* --  MCV 79.9 80.4 --  PLT 304 327 --   Cardiac Enzymes:  Lab 09/01/11 0500 08/30/11 0434 08/29/11 2014  CKTOTAL <7* 9 9  CKMB 1.1 1.3 1.2  CKMBINDEX -- -- --  TROPONINI <0.30 <0.30 <0.30   BNP:  Lab 09/01/11 0500 08/27/11 2100  PROBNP 973.1* 1011.0*     Micro Results: Recent Results (from the past 240 hour(s))  CULTURE, BLOOD (ROUTINE X 2)     Status: Normal   Collection Time   08/22/11  2:20 PM      Component Value Range Status Comment   Specimen Description BLOOD RIGHT HAND   Final    Special Requests     Final    Value: BOTTLES DRAWN AEROBIC AND ANAEROBIC AERO 10CC ANA 8CC   Culture  Setup Time [id number]   Final    Culture NO GROWTH 5 DAYS   Final    Report Status 08/28/2011 FINAL   Final   CULTURE, BLOOD (ROUTINE X 2)     Status: Normal   Collection Time   08/22/11  3:15 PM  Component Value Range Status Comment   Specimen Description BLOOD HAND RIGHT   Final    Special Requests BOTTLES DRAWN AEROBIC ONLY 3CC   Final    Culture  Setup Time [id number]   Final    Culture NO GROWTH 5 DAYS   Final    Report Status 08/28/2011 FINAL   Final   MRSA PCR SCREENING     Status: Normal   Collection Time   08/27/11  5:41 PM      Component Value Range Status Comment   MRSA by PCR NEGATIVE  NEGATIVE  Final   CULTURE, BLOOD (ROUTINE X 2)     Status: Normal (Preliminary result)   Collection Time   08/27/11  9:53 PM      Component Value Range Status Comment   Specimen Description BLOOD LEFT FOOT   Final    Special Requests BOTTLES DRAWN AEROBIC AND ANAEROBIC 5CC   Final    Culture  Setup Time [id number]   Final    Culture     Final    Value:        BLOOD CULTURE RECEIVED NO GROWTH TO DATE CULTURE WILL BE HELD FOR 5 DAYS BEFORE ISSUING A FINAL NEGATIVE REPORT   Report Status PENDING   Incomplete   CULTURE, BLOOD (ROUTINE X 2)      Status: Normal (Preliminary result)   Collection Time   08/27/11  9:59 PM      Component Value Range Status Comment   Specimen Description BLOOD RIGHT FOOT   Final    Special Requests BOTTLES DRAWN AEROBIC AND ANAEROBIC 5CC   Final    Culture  Setup Time [id number]   Final    Culture     Final    Value:        BLOOD CULTURE RECEIVED NO GROWTH TO DATE CULTURE WILL BE HELD FOR 5 DAYS BEFORE ISSUING A FINAL NEGATIVE REPORT   Report Status PENDING   Incomplete   BODY FLUID CULTURE     Status: Normal (Preliminary result)   Collection Time   08/28/11  3:54 PM      Component Value Range Status Comment   Specimen Description PLEURAL FLUID RIGHT   Final    Special Requests SYR   Final    Gram Stain     Final    Value: RARE WBC PRESENT,BOTH PMN AND MONONUCLEAR     NO ORGANISMS SEEN   Culture NO GROWTH 2 DAYS   Final    Report Status PENDING   Incomplete   FUNGUS CULTURE W SMEAR     Status: Normal (Preliminary result)   Collection Time   08/28/11  3:55 PM      Component Value Range Status Comment   Specimen Description PLEURAL FLUID RIGHT   Final    Special Requests SYR   Final    Fungal Smear NO YEAST OR FUNGAL ELEMENTS SEEN   Final    Culture CULTURE IN PROGRESS FOR FOUR WEEKS   Final    Report Status PENDING   Incomplete   ANAEROBIC CULTURE     Status: Normal (Preliminary result)   Collection Time   08/28/11  3:55 PM      Component Value Range Status Comment   Specimen Description PLEURAL FLUID RIGHT   Final    Special Requests SYR   Final    Gram Stain     Final    Value: RARE WBC PRESENT,BOTH PMN AND MONONUCLEAR  NO ORGANISMS SEEN   Culture     Final    Value: NO ANAEROBES ISOLATED; CULTURE IN PROGRESS FOR 5 DAYS   Report Status PENDING   Incomplete   AFB CULTURE WITH SMEAR     Status: Normal (Preliminary result)   Collection Time   08/28/11  3:56 PM      Component Value Range Status Comment   Specimen Description PLEURAL FLUID RIGHT   Final    Special Requests  SYR   Final    ACID FAST SMEAR NO ACID FAST BACILLI SEEN   Final    Culture     Final    Value: CULTURE WILL BE EXAMINED FOR 6 WEEKS BEFORE ISSUING A FINAL REPORT   Report Status PENDING   Incomplete   CULTURE, BLOOD (ROUTINE X 2)     Status: Normal (Preliminary result)   Collection Time   08/31/11  4:07 PM      Component Value Range Status Comment   Specimen Description BLOOD RIGHT HAND   Final    Special Requests BOTTLES DRAWN AEROBIC AND ANAEROBIC 8CC   Final    Culture  Setup Time [id number]   Final    Culture     Final    Value:        BLOOD CULTURE RECEIVED NO GROWTH TO DATE CULTURE WILL BE HELD FOR 5 DAYS BEFORE ISSUING A FINAL NEGATIVE REPORT   Report Status PENDING   Incomplete   CULTURE, BLOOD (ROUTINE X 2)     Status: Normal (Preliminary result)   Collection Time   08/31/11  4:25 PM      Component Value Range Status Comment   Specimen Description BLOOD RIGHT FOOT   Final    Special Requests BOTTLES DRAWN AEROBIC AND ANAEROBIC 10CC   Final    Culture  Setup Time [id number]   Final    Culture     Final    Value:        BLOOD CULTURE RECEIVED NO GROWTH TO DATE CULTURE WILL BE HELD FOR 5 DAYS BEFORE ISSUING A FINAL NEGATIVE REPORT   Report Status PENDING   Incomplete    Studies/Results: Dg Chest 2 View  09/01/2011  *RADIOLOGY REPORT*  Clinical Data: Shortness of breath.  CHEST - 2 VIEW  Comparison: 08/31/2011  Findings: Confluent airspace opacity in the right lower lobe again noted, with patchy bilateral opacities diffusely.  Opacities have increased since prior study.  Possible small right effusion.  Right PICC line is unchanged with the tip in the right atrium.  Prior CABG. Heart is borderline in size.  IMPRESSION: Increasing patchy bilateral airspace opacities, most confluent in the right lower lobe.  Findings concerning for pneumonia.  Suspect small right effusion.  Original Report Authenticated By: Cyndie Chime, M.D.   Dg Chest Bilateral Decubitus  09/01/2011   *RADIOLOGY REPORT*  Clinical Data: Shortness of breath.  Pleural effusions, status post thoracentesis.  CHEST - BILATERAL DECUBITUS VIEW  Comparison: Chest x-ray earlier today and 08/31/2011  Findings: There is a small to moderate layering right pleural effusion noted on the decubitus view.  IMPRESSION: Small to moderate layering right effusion.  Original Report Authenticated By: Cyndie Chime, M.D.   Dg Chest Port 1 View  08/31/2011  *RADIOLOGY REPORT*  Clinical Data: Follow-up.  CHF.  PORTABLE CHEST - 1 VIEW  Comparison: 08/29/2011  Findings: Continued opacity in the right lower lung, likely pneumonia.  Heart is mildly enlarged.  Prior CABG.  No  confluent opacity on the left.  Right PICC line remains in place with the tip in the right atrium.  Possible small right effusion.  IMPRESSION: Continued right lower lobe consolidation concerning for pneumonia. Possible small right effusion.  Original Report Authenticated By: Cyndie Chime, M.D.   Medications: I have reviewed the patient's current medications. Scheduled Meds:    . albuterol  2 puff Inhalation Q6H  . aspirin EC  81 mg Oral Daily  . calcium carbonate  1 tablet Oral TID WC  . docusate sodium  100 mg Oral BID  . enoxaparin (LOVENOX) injection  40 mg Subcutaneous Q24H  . feeding supplement  237 mL Oral BID BM  . furosemide  40 mg Intravenous Once  . furosemide  60 mg Oral Daily  . gi cocktail  30 mL Oral Once  . metoprolol  50 mg Oral BID  . micafungin (MYCAMINE) IV  150 mg Intravenous Q24H  . mulitivitamin with minerals  1 tablet Oral Daily  . nitroGLYCERIN      . pantoprazole  40 mg Oral Q1200  . piperacillin-tazobactam (ZOSYN)  IV  3.375 g Intravenous Q8H  . sodium chloride  3 mL Intravenous Q12H  . spironolactone  25 mg Oral Daily  . traZODone  50 mg Oral QHS  . vancomycin  1,000 mg Intravenous Q8H  . vitamin C  250 mg Oral Daily  . DISCONTD: ibuprofen  600 mg Oral QID   Continuous Infusions:  PRN Meds:.acetaminophen,  clonazePAM, ipratropium, levalbuterol, oxyCODONE-acetaminophen, polyethylene glycol, sodium chloride Assessment/Plan:  Assessment & Plan by Problem: 66 y/o m with pmh of recent prolonged and complicated hospitalization for fungal infection of the pacer leads leading to septic pulmonary emboli and fungemia causing septic shock is was admitted for chest pain and dyspnea is secondary to pleural effusion. Received diagnostic and therapeutic thoracentesis 08/28/2011. He had some dyspnea on the evening of 5/29-5/30 however this resolved with albuterol and furosemide.  1. Pulmonary effusion/pulmonary embolism/pulmonary infarction: CTA of chest showed mildly increased right pleural effusion without significant changes from 08/18/2011 with persistent bilateral PE with RLL thrombus and RLL extensive airspace disease, and small chest hematoma with trace gas. Chest pain shortness of breath likely pleuritic in nature secondary to pleural effusion. 08/28/2011 received ultrasound guided diagnostic and therapeutic thoracentesis to evaluate pleural fluid. Initial tap was blood tinged fluid. This is most consistent with his known pulmonary infarction. The initial results show a clearly exudative effusion with fluid LDH> serum LDH. Thus far no organism seen on smears or culture. Initial concern yesterday was for pneumothorax versus hemothorax post procedure. Portable chest x-ray showed no evidence of either pneumothorax or hemothorax. Nor was there increased consolidation from prior that would be consistent with reexpansion pulmonary edema or pneumonia. Overall increase shortness of breath likely secondary to combination of low functional reserve, incomplete expansion of right lung post procedure, and possible CHF. Treatment have focused on increasing furosemide and scheduled albuterol nebs to recruit as many alveoli as possible.  -- Follow AFB culture an stain fungal culture and stain, anaerobic culture and conventional fluid  culture -- Will treat with oxycodone-acetaminophen PRN (he is not be discharged on NSAIDS long term due to cardiac risk) -- will not anticoagulate at therapeutic doses due to risk of hemorrhage and the emboli are not thromboembolic in nature.  -- Continue lasix 60 mg PO daily  -- Will continue 2 puffs albuterol QID  2. 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 Shortness of breath is likely secondary to acute on chronic systolic CHF. He has lost a lot of pulmonary reserve, do to pulmonary infarction. 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 His increased shortness of breath the night before last and is likely secondary to fluid accumulation. This resolved with increased furosemide.Increase furosemide to 60 mg as well as Metoprolol to 50 mg bid yesterday . - continue secondary prevention with aspirin, beta blocker in addition to daily lasix as noted above. Of Note pt has ACEI and ARB allergy.  - has pending referral to cardiac rehab  - new pacemaker to be placed upon resolution of infection  - monitor I/Os  -started spironolactone 50mg  -would prefer to remove condom catheter as soon as possible, however patient does not feel that he is strong enough to use urinal currently  3. Persistent Fever with increased leukocytosis- Improved. the most likely cause of persistent fever is his known persistent fungal infection that is gradually resolving. Less likely causes are fungal infection resistant to mycofungin (unlikely), or necrotic pulmonary process. UTI was clean and cultures taken yesterday have not yet resulted. Abscess of the pacer pocket is a possibility, though less likely as it does not appear clinically to be infected. Blood cultures negative to date. Fungal cultures 5/16 are positive for candida albicans. Will  plan on continuing micafungin for now. We will continue to monitor for fever, but will expect that occasionally these will arise - monitor for fevers  - Follow blood cultures x2 from 5/28 - Continue mycofungin for total of 6 weeks per recent discharge plans  - Will defer ultrasound of the pacer pocket until evaluation of pulmonary fluid complete  4. GERD/Candida Esophagitis - stable, will continue with continue micafungin and PPI.   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 Tachycardia- In setting of fever, anxiety, active infection and chest pain. Will not increase beta blocker at this time as he is are feeling fatigued and may be somewhat decompensated. Metoprolol to 50 mg BID  7. Anxiety and insomnia- Mr. Proby slept much better last night with the addition of trazodone. . Will continue clonazepam and trazodone for sleep. Patient is to maintain good sleep hygine today and not nap during the day.   8. Normocytic anemia - stable Hgb from recent hospitalization range 8-9, previously 14 in March 2013. His anemia panel demonstrated: Iron = 21, Sat ratio = 9, TIBC =243, Ferritin = 597, Retic count =1.9, FOBT negative. Peripheral smear revealed mild anemia with normal cell morphology. Findings consistent with Anemia of Chronic Disease given that TIBC is normal (usually elevated in Iron Deficiency Anemia) and ferritin high although could still be a component of acute phase reactant.  -con to monitor CBC   9. Hyponatremia: Mild and improving with furosemide. Likely secondary to CHF. -cont to montior   10. 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 We will continue allowing the patient to eat ad lib dysphagia 1 diet. and continue strawberry clinical strength  Ensure.  11. Chest pain- patient complains of pleuritic chest pain overnight. -cardiac enzymes negative, EKG with no acute ischemic changes -pain has improved significantly -stopped ibuprofen for any possible gastritis  component of pain, patient on PPI  12. Possible Healthcare Associated pneumonia -WBC count rose slightly to 9.5, spiked fever of 102.7 yesterday morning -was started on vancomycin & zosyn after discussion with ID -repeat decubitus chest X-ray and 2-view show small right sided effusion and developing opacity concerning for pneumonia vs. necrosis due to pulmonary septic emboli -will repeat CT with contrast to assess for progression of necrosis vs. new pneumonia -will keep patient in SDU today and continue antibiotic treatment, await blood cultures  DVT prophylaxis - lovenox 40    LOS: 5 days   Margorie John 09/01/2011, 11:16 AM

## 2011-09-01 NOTE — Progress Notes (Addendum)
INFECTIOUS DISEASE PROGRESS NOTE  ID: [name] is a 66 y.o. male with   Principal Problem:  *Pleural effusion Active Problems:  Chronic systolic congestive heart failure  Esophageal stricture  Septic embolism  Candidal endocarditis  Malnutrition  Subjective: Transferred to step down for CP/SOB this AM. States his pain is down to a "1" this am.   Abtx:  Anti-infectives     Start     Dose/Rate Route Frequency Ordered Stop   08/31/11 1600   vancomycin (VANCOCIN) IVPB 1000 mg/200 mL premix        1,000 mg 200 mL/hr over 60 Minutes Intravenous Every 8 hours 08/31/11 1527     08/31/11 1600  piperacillin-tazobactam (ZOSYN) IVPB 3.375 g       3.375 g 12.5 mL/hr over 240 Minutes Intravenous 3 times per day 08/31/11 1527     08/27/11 2200   micafungin (MYCAMINE) 150 mg in sodium chloride 0.9 % 100 mL IVPB        150 mg 100 mL/hr over 1 Hours Intravenous Every 24 hours 08/27/11 2031            Medications:  Scheduled:   . albuterol  2 puff Inhalation Q6H  . aspirin EC  81 mg Oral Daily  . calcium carbonate  1 tablet Oral TID WC  . docusate sodium  100 mg Oral BID  . enoxaparin (LOVENOX) injection  40 mg Subcutaneous Q24H  . feeding supplement  237 mL Oral BID BM  . furosemide  40 mg Intravenous Once  . furosemide  60 mg Oral Daily  . gi cocktail  30 mL Oral Once  . metoprolol  50 mg Oral BID  . micafungin (MYCAMINE) IV  150 mg Intravenous Q24H  . mulitivitamin with minerals  1 tablet Oral Daily  . nitroGLYCERIN      . pantoprazole  40 mg Oral Q1200  . piperacillin-tazobactam (ZOSYN)  IV  3.375 g Intravenous Q8H  . sodium chloride  3 mL Intravenous Q12H  . spironolactone  25 mg Oral Daily  . traZODone  50 mg Oral QHS  . vancomycin  1,000 mg Intravenous Q8H  . vitamin C  250 mg Oral Daily  . DISCONTD: ibuprofen  600 mg Oral QID    Objective: Vital signs in last 24 hours: Temp:  [98 F (36.7 C)-102.7 F (39.3 C)] 98.9 F (37.2 C) (06/02 0730) Pulse Rate:   [69-128] 98  (06/02 0730) Resp:  [18-32] 27  (06/02 0730) BP: (105-173)/(49-80) 105/62 mmHg (06/02 0730) SpO2:  [94 %-100 %] 100 % (06/02 0909) Weight:  [81.7 kg (180 lb 1.9 oz)] 81.7 kg (180 lb 1.9 oz) (06/02 0515)   General appearance: alert, cooperative and moderate distress Resp: rhonchi anterior - right Cardio: regular rate and rhythm GI: normal findings: bowel sounds normal and soft, non-tender Extremities: edema none and RUE PIC is clean, non-tender, no d/c.   Lab Results  Basename 09/01/11 0500 08/31/11 1631  WBC 9.5 8.8  HGB 8.2* 8.6*  HCT 26.6* 27.9*  NA 132* 130*  K 4.2 4.4  CL 91* 92*  CO2 31 28  BUN 16 18  CREATININE 0.75 0.79  GLU -- --   Liver Panel  Basename 08/31/11 1631  PROT 7.8  ALBUMIN 2.0*  AST 36  ALT 21  ALKPHOS 155*  BILITOT 0.2*  BILIDIR --  IBILI --   Sedimentation Rate No results found for this basename: ESRSEDRATE in the last 72 hours C-Reactive Protein No results found  for this basename: CRP:2 in the last 72 hours  Microbiology: Recent Results (from the past 240 hour(s))  CULTURE, BLOOD (ROUTINE X 2)     Status: Normal   Collection Time   08/22/11  2:20 PM      Component Value Range Status Comment   Specimen Description BLOOD RIGHT HAND   Final    Special Requests     Final    Value: BOTTLES DRAWN AEROBIC AND ANAEROBIC AERO 10CC ANA 8CC   Culture  Setup Time [id number]   Final    Culture NO GROWTH 5 DAYS   Final    Report Status 08/28/2011 FINAL   Final   CULTURE, BLOOD (ROUTINE X 2)     Status: Normal   Collection Time   08/22/11  3:15 PM      Component Value Range Status Comment   Specimen Description BLOOD HAND RIGHT   Final    Special Requests BOTTLES DRAWN AEROBIC ONLY 3CC   Final    Culture  Setup Time [id number]   Final    Culture NO GROWTH 5 DAYS   Final    Report Status 08/28/2011 FINAL   Final   MRSA PCR SCREENING     Status: Normal   Collection Time   08/27/11  5:41 PM      Component Value Range Status  Comment   MRSA by PCR NEGATIVE  NEGATIVE  Final   CULTURE, BLOOD (ROUTINE X 2)     Status: Normal (Preliminary result)   Collection Time   08/27/11  9:53 PM      Component Value Range Status Comment   Specimen Description BLOOD LEFT FOOT   Final    Special Requests BOTTLES DRAWN AEROBIC AND ANAEROBIC 5CC   Final    Culture  Setup Time [id number]   Final    Culture     Final    Value:        BLOOD CULTURE RECEIVED NO GROWTH TO DATE CULTURE WILL BE HELD FOR 5 DAYS BEFORE ISSUING A FINAL NEGATIVE REPORT   Report Status PENDING   Incomplete   CULTURE, BLOOD (ROUTINE X 2)     Status: Normal (Preliminary result)   Collection Time   08/27/11  9:59 PM      Component Value Range Status Comment   Specimen Description BLOOD RIGHT FOOT   Final    Special Requests BOTTLES DRAWN AEROBIC AND ANAEROBIC 5CC   Final    Culture  Setup Time [id number]   Final    Culture     Final    Value:        BLOOD CULTURE RECEIVED NO GROWTH TO DATE CULTURE WILL BE HELD FOR 5 DAYS BEFORE ISSUING A FINAL NEGATIVE REPORT   Report Status PENDING   Incomplete   BODY FLUID CULTURE     Status: Normal (Preliminary result)   Collection Time   08/28/11  3:54 PM      Component Value Range Status Comment   Specimen Description PLEURAL FLUID RIGHT   Final    Special Requests SYR   Final    Gram Stain     Final    Value: RARE WBC PRESENT,BOTH PMN AND MONONUCLEAR     NO ORGANISMS SEEN   Culture NO GROWTH 2 DAYS   Final    Report Status PENDING   Incomplete   FUNGUS CULTURE W SMEAR     Status: Normal (Preliminary result)   Collection Time   08/28/11  3:55 PM      Component Value Range Status Comment   Specimen Description PLEURAL FLUID RIGHT   Final    Special Requests SYR   Final    Fungal Smear NO YEAST OR FUNGAL ELEMENTS SEEN   Final    Culture CULTURE IN PROGRESS FOR FOUR WEEKS   Final    Report Status PENDING   Incomplete   ANAEROBIC CULTURE     Status: Normal (Preliminary result)   Collection Time    08/28/11  3:55 PM      Component Value Range Status Comment   Specimen Description PLEURAL FLUID RIGHT   Final    Special Requests SYR   Final    Gram Stain     Final    Value: RARE WBC PRESENT,BOTH PMN AND MONONUCLEAR     NO ORGANISMS SEEN   Culture     Final    Value: NO ANAEROBES ISOLATED; CULTURE IN PROGRESS FOR 5 DAYS   Report Status PENDING   Incomplete   AFB CULTURE WITH SMEAR     Status: Normal (Preliminary result)   Collection Time   08/28/11  3:56 PM      Component Value Range Status Comment   Specimen Description PLEURAL FLUID RIGHT   Final    Special Requests SYR   Final    ACID FAST SMEAR NO ACID FAST BACILLI SEEN   Final    Culture     Final    Value: CULTURE WILL BE EXAMINED FOR 6 WEEKS BEFORE ISSUING A FINAL REPORT   Report Status PENDING   Incomplete   CULTURE, BLOOD (ROUTINE X 2)     Status: Normal (Preliminary result)   Collection Time   08/31/11  4:07 PM      Component Value Range Status Comment   Specimen Description BLOOD RIGHT HAND   Final    Special Requests BOTTLES DRAWN AEROBIC AND ANAEROBIC 8CC   Final    Culture  Setup Time [id number]   Final    Culture     Final    Value:        BLOOD CULTURE RECEIVED NO GROWTH TO DATE CULTURE WILL BE HELD FOR 5 DAYS BEFORE ISSUING A FINAL NEGATIVE REPORT   Report Status PENDING   Incomplete   CULTURE, BLOOD (ROUTINE X 2)     Status: Normal (Preliminary result)   Collection Time   08/31/11  4:25 PM      Component Value Range Status Comment   Specimen Description BLOOD RIGHT FOOT   Final    Special Requests BOTTLES DRAWN AEROBIC AND ANAEROBIC 10CC   Final    Culture  Setup Time [id number]   Final    Culture     Final    Value:        BLOOD CULTURE RECEIVED NO GROWTH TO DATE CULTURE WILL BE HELD FOR 5 DAYS BEFORE ISSUING A FINAL NEGATIVE REPORT   Report Status PENDING   Incomplete     Studies/Results: Dg Chest 2 View  09/01/2011  *RADIOLOGY REPORT*  Clinical Data: Shortness of breath.  CHEST - 2 VIEW   Comparison: 08/31/2011  Findings: Confluent airspace opacity in the right lower lobe again noted, with patchy bilateral opacities diffusely.  Opacities have increased since prior study.  Possible small right effusion.  Right PICC line is unchanged with the tip in the right atrium.  Prior CABG. Heart is borderline in size.  IMPRESSION: Increasing patchy bilateral airspace opacities,  most confluent in the right lower lobe.  Findings concerning for pneumonia.  Suspect small right effusion.  Original Report Authenticated By: Cyndie Chime, M.D.   Dg Chest Bilateral Decubitus  09/01/2011  *RADIOLOGY REPORT*  Clinical Data: Shortness of breath.  Pleural effusions, status post thoracentesis.  CHEST - BILATERAL DECUBITUS VIEW  Comparison: Chest x-ray earlier today and 08/31/2011  Findings: There is a small to moderate layering right pleural effusion noted on the decubitus view.  IMPRESSION: Small to moderate layering right effusion.  Original Report Authenticated By: Cyndie Chime, M.D.   Dg Chest Port 1 View  08/31/2011  *RADIOLOGY REPORT*  Clinical Data: Follow-up.  CHF.  PORTABLE CHEST - 1 VIEW  Comparison: 08/29/2011  Findings: Continued opacity in the right lower lung, likely pneumonia.  Heart is mildly enlarged.  Prior CABG.  No confluent opacity on the left.  Right PICC line remains in place with the tip in the right atrium.  Possible small right effusion.  IMPRESSION: Continued right lower lobe consolidation concerning for pneumonia. Possible small right effusion.  Original Report Authenticated By: Cyndie Chime, M.D.     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  Need to watch his former pacer site very closely, low threshold to I & D.   CXR increasing opacities Cardiac markers (-) so far, BNP up.  BCx 6-1 (-) so far Explained to pt and family  [name] Infectious Diseases [phone] 09/01/2011, 10:54 AM   LOS: 5 days

## 2011-09-01 NOTE — Progress Notes (Signed)
Pt c/o left side chest pain 10/10 with partial relief after 2 sl ntg - 3rd declined bp 122/73.  EKG with no acute changes noted.  Pt tachypneic with resp 26-28 at rest and HR 100-110, Temp 99.7.  MD notified with new orders. 40 IV lasix given and condom cath applied, awaiting lab draw.  Report called to [facility] on 2900.  Will transport with oxygen and monitor.  Wife at bedside and both patient and wife understand.

## 2011-09-02 DIAGNOSIS — J852 Abscess of lung without pneumonia: Secondary | ICD-10-CM

## 2011-09-02 DIAGNOSIS — I2690 Septic pulmonary embolism without acute cor pulmonale: Secondary | ICD-10-CM

## 2011-09-02 LAB — ANAEROBIC CULTURE

## 2011-09-02 LAB — DIFFERENTIAL
Basophils Absolute: 0 10*3/uL (ref 0.0–0.1)
Basophils Relative: 0 % (ref 0–1)
Eosinophils Absolute: 0.2 10*3/uL (ref 0.0–0.7)
Eosinophils Relative: 2 % (ref 0–5)
Monocytes Absolute: 0.9 10*3/uL (ref 0.1–1.0)
Neutro Abs: 7.3 10*3/uL (ref 1.7–7.7)

## 2011-09-02 LAB — BASIC METABOLIC PANEL
BUN: 13 mg/dL (ref 6–23)
Calcium: 9.1 mg/dL (ref 8.4–10.5)
Creatinine, Ser: 0.76 mg/dL (ref 0.50–1.35)
GFR calc Af Amer: >90 mL/min (ref >90)
GFR calc non Af Amer: >90 mL/min (ref >90)

## 2011-09-02 LAB — CBC
HCT: 25.4 % — ABNORMAL LOW (ref 39.0–52.0)
MCH: 24.9 pg — ABNORMAL LOW (ref 26.0–34.0)
MCHC: 31.5 g/dL (ref 30.0–36.0)
MCV: 79.1 fL (ref 78.0–100.0)
RDW: 15.6 % — ABNORMAL HIGH (ref 11.5–15.5)

## 2011-09-02 LAB — VANCOMYCIN, TROUGH: Vancomycin Tr: 21.7 ug/mL — ABNORMAL HIGH (ref 10.0–20.0)

## 2011-09-02 MED ORDER — FLUCONAZOLE IN SODIUM CHLORIDE 400-0.9 MG/200ML-% IV SOLN
400.0000 mg | INTRAVENOUS | Status: DC
  Administered 2011-09-02 – 2011-09-14 (×13): 400 mg via INTRAVENOUS
  Filled 2011-09-02 (×13): qty 200

## 2011-09-02 MED ORDER — VANCOMYCIN HCL 1000 MG IV SOLR
1250.0000 mg | Freq: Two times a day (BID) | INTRAVENOUS | Status: DC
  Administered 2011-09-02 – 2011-09-03 (×2): 1250 mg via INTRAVENOUS
  Filled 2011-09-02 (×3): qty 1250

## 2011-09-02 NOTE — Progress Notes (Signed)
Physical Therapy Treatment Patient Details Name: [name] MRN: [id number] DOB: 1946-03-26 Today's Date: 09/02/2011 Time: 4098-1191 (Incr. time secondary to propaq set up) PT Time Calculation (min): 25 min  PT Assessment / Plan / Recommendation Comments on Treatment Session  Patient with 10/10 chest pain over weekend requiring transfer to the step-down unit for closer monitoring. He presents today with mobility at a similar level to initial evaluation so we will continue to follow to ensure that patient can continue to improve his functional capacity and activity tolerance.     Follow Up Recommendations  Supervision for mobility/OOB;No PT follow up    Barriers to Discharge  None      Equipment Recommendations  None recommended by PT    Recommendations for Other Services  None  Frequency Min 3X/week   Plan Discharge plan remains appropriate;Frequency remains appropriate    Precautions / Restrictions Precautions Precautions: Fall   Pertinent Vitals/Pain SpO2 95% on 3 liters with gait and HR 118 max. "lung pain" with gait, abolished with rest.    Mobility  Bed Mobility Supine to Sit: 6: Modified independent (Device/Increase time) Sitting - Scoot to Edge of Bed: 6: Modified independent (Device/Increase time) Sit to Supine: 6: Modified independent (Device/Increase time) Transfers Sit to Stand: 5: Supervision;With upper extremity assist;From bed Stand to Sit: 5: Supervision;With upper extremity assist;To bed Details for Transfer Assistance: For safety only and verbal cues to recall hand placement to and from walker. Ambulation/Gait Ambulation/Gait Assistance: 5: Supervision Ambulation Distance (Feet): 150 Feet Assistive device: Rolling walker Ambulation/Gait Assistance Details: Improved safety with device and distance maintained from walker Gait Pattern: Step-through pattern;Decreased stride length     PT Goals Acute Rehab PT Goals PT Goal: Sit to Stand - Progress:  Progressing toward goal PT Goal: Stand to Sit - Progress: Progressing toward goal PT Goal: Stand - Progress: Progressing toward goal PT Goal: Ambulate - Progress: Progressing toward goal  Visit Information  Last PT Received On: 09/02/11 Assistance Needed: +1    Subjective Data  Subjective: Patient reports shortness of breath so variable and states if I had come 1.5 hours ago he would not have been able to walk Patient Stated Goal: Back to 3700   Cognition  Overall Cognitive Status: Appears within functional limits for tasks assessed/performed Arousal/Alertness: Awake/alert Orientation Level: Appears intact for tasks assessed Behavior During Session: [facility] for tasks performed    Balance  Static Standing Balance Static Standing - Balance Support: Bilateral upper extremity supported Static Standing - Level of Assistance: 6: Modified independent (Device/Increase time)  End of Session PT - End of Session Equipment Utilized During Treatment: Gait belt Activity Tolerance: Patient tolerated treatment well;Patient limited by fatigue Patient left: in bed;with call bell/phone within reach;with family/visitor present Nurse Communication: Mobility status    Edwyna Perfect, PT  Pager [phone]  09/02/2011, 11:47 AM

## 2011-09-02 NOTE — Progress Notes (Signed)
INFECTIOUS DISEASE PROGRESS NOTE  ID: [name] is a 66 y.o. male with   Principal Problem:  *Candidemia Active Problems:  Chronic systolic congestive heart failure  Normocytic anemia  Esophageal stricture  Thrombocytopenia  Septic embolism  Candidal endocarditis  Fever  Pleural effusion  Malnutrition  Subjective: Fever in last 24h. Ha episode of CP and SPB this AM, relieved with percocet.   Abtx:  Anti-infectives     Start     Dose/Rate Route Frequency Ordered Stop   09/02/11 1400   fluconazole (DIFLUCAN) IVPB 400 mg        400 mg 200 mL/hr over 60 Minutes Intravenous Every 24 hours 09/02/11 1300     09/02/11 1200   vancomycin (VANCOCIN) 1,250 mg in sodium chloride 0.9 % 250 mL IVPB        1,250 mg 166.7 mL/hr over 90 Minutes Intravenous Every 12 hours 09/02/11 1001     08/31/11 1600   vancomycin (VANCOCIN) IVPB 1000 mg/200 mL premix  Status:  Discontinued        1,000 mg 200 mL/hr over 60 Minutes Intravenous Every 8 hours 08/31/11 1527 09/02/11 1001   08/31/11 1600  piperacillin-tazobactam (ZOSYN) IVPB 3.375 g       3.375 g 12.5 mL/hr over 240 Minutes Intravenous 3 times per day 08/31/11 1527     08/27/11 2200   micafungin (MYCAMINE) 150 mg in sodium chloride 0.9 % 100 mL IVPB  Status:  Discontinued        150 mg 100 mL/hr over 1 Hours Intravenous Every 24 hours 08/27/11 2031 09/02/11 1300          Medications:  Scheduled:   . albuterol  2 puff Inhalation Q6H  . aspirin EC  81 mg Oral Daily  . calcium carbonate  1 tablet Oral TID WC  . docusate sodium  100 mg Oral BID  . enoxaparin (LOVENOX) injection  40 mg Subcutaneous Q24H  . feeding supplement  237 mL Oral BID BM  . fluconazole (DIFLUCAN) IV  400 mg Intravenous Q24H  . furosemide  60 mg Oral Daily  . metoprolol  50 mg Oral BID  . mulitivitamin with minerals  1 tablet Oral Daily  . pantoprazole  40 mg Oral Q1200  . piperacillin-tazobactam (ZOSYN)  IV  3.375 g Intravenous Q8H  . sodium  chloride  3 mL Intravenous Q12H  . traZODone  50 mg Oral QHS  . vancomycin  1,250 mg Intravenous Q12H  . vitamin C  250 mg Oral Daily  . DISCONTD: micafungin (MYCAMINE) IV  150 mg Intravenous Q24H  . DISCONTD: vancomycin  1,000 mg Intravenous Q8H    Objective: Vital signs in last 24 hours: Temp:  [97.2 F (36.2 C)-99.1 F (37.3 C)] 99.1 F (37.3 C) (06/03 1121) Pulse Rate:  [90-118] 102  (06/03 1121) Resp:  [20-35] 20  (06/03 1200) BP: (116-129)/(62-86) 119/62 mmHg (06/03 1200) SpO2:  [93 %-100 %] 97 % (06/03 1437)   Resp: rhonchi anterior - right Cardio: regular rate and rhythm GI: normal findings: bowel sounds normal and soft, non-tender  Lab Results  Basename 09/02/11 0437 09/01/11 0500  WBC 9.8 9.5  HGB 8.0* 8.2*  HCT 25.4* 26.6*  NA 129* 132*  K 4.0 4.2  CL 89* 91*  CO2 33* 31  BUN 13 16  CREATININE 0.76 0.75  GLU -- --   Liver Panel  Basename 08/31/11 1631  PROT 7.8  ALBUMIN 2.0*  AST 36  ALT 21  ALKPHOS  155*  BILITOT 0.2*  BILIDIR --  IBILI --   Sedimentation Rate No results found for this basename: ESRSEDRATE in the last 72 hours C-Reactive Protein No results found for this basename: CRP:2 in the last 72 hours  Microbiology: Recent Results (from the past 240 hour(s))  MRSA PCR SCREENING     Status: Normal   Collection Time   08/27/11  5:41 PM      Component Value Range Status Comment   MRSA by PCR NEGATIVE  NEGATIVE  Final   CULTURE, BLOOD (ROUTINE X 2)     Status: Normal (Preliminary result)   Collection Time   08/27/11  9:53 PM      Component Value Range Status Comment   Specimen Description BLOOD LEFT FOOT   Final    Special Requests BOTTLES DRAWN AEROBIC AND ANAEROBIC 5CC   Final    Culture  Setup Time [id number]   Final    Culture     Final    Value:        BLOOD CULTURE RECEIVED NO GROWTH TO DATE CULTURE WILL BE HELD FOR 5 DAYS BEFORE ISSUING A FINAL NEGATIVE REPORT   Report Status PENDING   Incomplete   CULTURE, BLOOD (ROUTINE X  2)     Status: Normal (Preliminary result)   Collection Time   08/27/11  9:59 PM      Component Value Range Status Comment   Specimen Description BLOOD RIGHT FOOT   Final    Special Requests BOTTLES DRAWN AEROBIC AND ANAEROBIC 5CC   Final    Culture  Setup Time [id number]   Final    Culture     Final    Value:        BLOOD CULTURE RECEIVED NO GROWTH TO DATE CULTURE WILL BE HELD FOR 5 DAYS BEFORE ISSUING A FINAL NEGATIVE REPORT   Report Status PENDING   Incomplete   BODY FLUID CULTURE     Status: Normal   Collection Time   08/28/11  3:54 PM      Component Value Range Status Comment   Specimen Description PLEURAL FLUID RIGHT   Final    Special Requests SYR   Final    Gram Stain     Final    Value: RARE WBC PRESENT,BOTH PMN AND MONONUCLEAR     NO ORGANISMS SEEN   Culture NO GROWTH 3 DAYS   Final    Report Status 09/01/2011 FINAL   Final   FUNGUS CULTURE W SMEAR     Status: Normal (Preliminary result)   Collection Time   08/28/11  3:55 PM      Component Value Range Status Comment   Specimen Description PLEURAL FLUID RIGHT   Final    Special Requests SYR   Final    Fungal Smear NO YEAST OR FUNGAL ELEMENTS SEEN   Final    Culture CULTURE IN PROGRESS FOR FOUR WEEKS   Final    Report Status PENDING   Incomplete   ANAEROBIC CULTURE     Status: Normal   Collection Time   08/28/11  3:55 PM      Component Value Range Status Comment   Specimen Description PLEURAL FLUID RIGHT   Final    Special Requests SYR   Final    Gram Stain     Final    Value: RARE WBC PRESENT,BOTH PMN AND MONONUCLEAR     NO ORGANISMS SEEN   Culture NO ANAEROBES ISOLATED   Final  Report Status 09/02/2011 FINAL   Final   AFB CULTURE WITH SMEAR     Status: Normal (Preliminary result)   Collection Time   08/28/11  3:56 PM      Component Value Range Status Comment   Specimen Description PLEURAL FLUID RIGHT   Final    Special Requests SYR   Final    ACID FAST SMEAR NO ACID FAST BACILLI SEEN   Final     Culture     Final    Value: CULTURE WILL BE EXAMINED FOR 6 WEEKS BEFORE ISSUING A FINAL REPORT   Report Status PENDING   Incomplete   URINE CULTURE     Status: Normal (Preliminary result)   Collection Time   08/31/11  3:57 PM      Component Value Range Status Comment   Specimen Description URINE, RANDOM   Final    Special Requests NONE   Final    Culture  Setup Time [id number]   Final    Colony Count 85,000 COLONIES/ML   Final    Culture GRAM NEGATIVE RODS   Final    Report Status PENDING   Incomplete   CULTURE, BLOOD (ROUTINE X 2)     Status: Normal (Preliminary result)   Collection Time   08/31/11  4:07 PM      Component Value Range Status Comment   Specimen Description BLOOD RIGHT HAND   Final    Special Requests BOTTLES DRAWN AEROBIC AND ANAEROBIC 8CC   Final    Culture  Setup Time [id number]   Final    Culture     Final    Value:        BLOOD CULTURE RECEIVED NO GROWTH TO DATE CULTURE WILL BE HELD FOR 5 DAYS BEFORE ISSUING A FINAL NEGATIVE REPORT   Report Status PENDING   Incomplete   CULTURE, BLOOD (ROUTINE X 2)     Status: Normal (Preliminary result)   Collection Time   08/31/11  4:25 PM      Component Value Range Status Comment   Specimen Description BLOOD RIGHT FOOT   Final    Special Requests BOTTLES DRAWN AEROBIC AND ANAEROBIC 10CC   Final    Culture  Setup Time [id number]   Final    Culture     Final    Value:        BLOOD CULTURE RECEIVED NO GROWTH TO DATE CULTURE WILL BE HELD FOR 5 DAYS BEFORE ISSUING A FINAL NEGATIVE REPORT   Report Status PENDING   Incomplete     Studies/Results: Dg Chest 2 View  09/01/2011  *RADIOLOGY REPORT*  Clinical Data: Shortness of breath.  CHEST - 2 VIEW  Comparison: 08/31/2011  Findings: Confluent airspace opacity in the right lower lobe again noted, with patchy bilateral opacities diffusely.  Opacities have increased since prior study.  Possible small right effusion.  Right PICC line is unchanged with the tip in the right atrium.   Prior CABG. Heart is borderline in size.  IMPRESSION: Increasing patchy bilateral airspace opacities, most confluent in the right lower lobe.  Findings concerning for pneumonia.  Suspect small right effusion.  Original Report Authenticated By: Cyndie Chime, M.D.   Ct Chest W Contrast  09/01/2011  *RADIOLOGY REPORT*  Clinical Data: Follow-up progression of necrotic area.  CT CHEST WITH CONTRAST  Technique:  Multidetector CT imaging of the chest was performed following the standard protocol during bolus administration of intravenous contrast.  Contrast:   80 ml Omnipaque 300  IV.  Comparison: Chest CT 08/18/2011  Findings: Again noted is the large central right pulmonary embolus with extension into the right lower lobe and right middle lobe pulmonary arteries, unchanged.  Embolus and occlusion of left upper lobe pulmonary arterial branches also again noted.  Extensive airspace disease throughout the right lower lobe.  Moderate right pleural effusion, possibly partially loculated.  Airspace disease in the right lower lobe has improved slightly since prior study. Patchy areas of airspace disease in the left lung, particularly the left lower lobe are unchanged.  Increasing wedge-shaped ground- glass opacity in the left upper lobe anteriorly, possibly infarct.  Small scattered mediastinal lymph nodes, none pathologically enlarged.  No hilar or axillary adenopathy.  Visualized thyroid and chest wall soft tissues unremarkable. Imaging into the upper abdomen shows no acute findings.  IMPRESSION: Large central right pulmonary embolus again noted, unchanged. Slight improvement in airspace disease within the right lower lobe. Right effusion, partially loculated, stable.  Left lung pulmonary emboli and ground-glass opacities again noted. New small wedge-shaped ground-glass opacity in the anterior left upper lobe, likely related to the emboli and pulmonary infarct.  Original Report Authenticated By: Cyndie Chime, M.D.   Dg  Chest Bilateral Decubitus  09/01/2011  *RADIOLOGY REPORT*  Clinical Data: Shortness of breath.  Pleural effusions, status post thoracentesis.  CHEST - BILATERAL DECUBITUS VIEW  Comparison: Chest x-ray earlier today and 08/31/2011  Findings: There is a small to moderate layering right pleural effusion noted on the decubitus view.  IMPRESSION: Small to moderate layering right effusion.  Original Report Authenticated By: Cyndie Chime, M.D.     Assessment/Plan: Pulmonary Infarct/septic emboli  Fungemia  Pacer site inflamation  Would-  Change micafungin to diflucan (Day 20 antifungal therapy)  Day 2 vanco/zosyn  His yeast is sensitive to diflucan ( i have not been able to see this document, they are faxing to me), changed to IV diflucan. 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  CVTS to eval.  Need to watch his former pacer site very closely, low threshold to I & D. Seems to be smaller today, less firm. No erythema.  Cardiac markers (-), BNP coming down.  BCx 6-1 (-) so far  Explained to family   Johny Sax Infectious Diseases [phone] 09/02/2011, 3:33 PM   LOS: 6 days

## 2011-09-02 NOTE — Progress Notes (Signed)
ANTIBIOTIC CONSULT NOTE - FOLLOW UP  Pharmacy Consult for Vancomycin + Zosyn Indication: suspected HCAP  Allergies  Allergen Reactions  . Avapro (Irbesartan)     unknown  . Codeine     unknown  . Crestor (Rosuvastatin Calcium)     unknown  . Lipitor (Atorvastatin Calcium)     unknown  . Lisinopril Cough  . Morphine     unknown  . Zocor (Simvastatin - High Dose)     unknown   Vital Signs: Temp: 98.6 F (37 C) (06/03 0332) Temp src: Oral (06/03 0332) BP: 117/86 mmHg (06/03 0730) Pulse Rate: 100  (06/03 0332) Intake/Output from previous day: 06/02 0701 - 06/03 0700 In: 470 [P.O.:120; IV Piggyback:350] Out: 2000 [Urine:2000] Intake/Output from this shift: Total I/O In: 200 [P.O.:200] Out: -   Labs:  Basename 09/02/11 0437 09/01/11 0500 08/31/11 1631  WBC 9.8 9.5 8.8  HGB 8.0* 8.2* 8.6*  PLT 322 304 327  LABCREA -- -- --  CREATININE 0.76 0.75 0.79   Estimated Creatinine Clearance: 95.1 ml/min (by C-G formula based on Cr of 0.76).  Basename 09/02/11 0854  VANCOTROUGH 21.7*  VANCOPEAK --  Drue Dun --  GENTTROUGH --  GENTPEAK --  GENTRANDOM --  Adele Dan --  Nolen Mu --  TOBRARND --  AMIKACINPEAK --  AMIKACINTROU --  AMIKACIN --    Assessment: Vanc 6/1>> Zosyn 6/1>> Micafungin (through 6/27 for 6 weeks total)>>  5/29 pleural fluid cx - negative, no anaerobes, fungal culture pending 5/28 blood cx - NGTD 6/1 BCx x2 - NGTD 6/1 UCx - 85k GNR  65yom continues on antibiotics for suspected HCAP, which may be ongoing inflammation/necrosis from recent fungal PE.  Vancomycin trough is slightly supratherapeutic on 1g q8 (drawn ~1 hour late so true trough likely higher). Renal function has remained stable with good UOP on po lasix.  Zosyn/Micafungin doses appropriate.  Goal of Therapy:  Vancomycin trough level 15-20 mcg/ml  Plan:  1) Change Vancomycin to 1250mg  q12 (next dose due @ 1200 today) 2) Continue Zosyn 3.375g q8 3) Obtain another Vancomycin  trough at steady state 4) Continue to follow renal function, cultures, clinical status  [name] 09/02/2011,10:10 AM

## 2011-09-02 NOTE — Progress Notes (Signed)
Nutrition Consult/Follow-up  Patient states his appetite is still poor. Initial assessment completed 5/29. PO intake at 20-90% per flowsheet records. Ensure Complete ordered -- drinking 2-3 per day. MVI PO daily.  Diet Order:  Heart Healthy  Meds: Scheduled Meds:   . albuterol  2 puff Inhalation Q6H  . aspirin EC  81 mg Oral Daily  . calcium carbonate  1 tablet Oral TID WC  . docusate sodium  100 mg Oral BID  . enoxaparin (LOVENOX) injection  40 mg Subcutaneous Q24H  . feeding supplement  237 mL Oral BID BM  . furosemide  60 mg Oral Daily  . metoprolol  50 mg Oral BID  . micafungin (MYCAMINE) IV  150 mg Intravenous Q24H  . mulitivitamin with minerals  1 tablet Oral Daily  . nitroGLYCERIN      . pantoprazole  40 mg Oral Q1200  . piperacillin-tazobactam (ZOSYN)  IV  3.375 g Intravenous Q8H  . sodium chloride  3 mL Intravenous Q12H  . traZODone  50 mg Oral QHS  . vancomycin  1,250 mg Intravenous Q12H  . vitamin C  250 mg Oral Daily  . DISCONTD: spironolactone  25 mg Oral Daily  . DISCONTD: vancomycin  1,000 mg Intravenous Q8H   Continuous Infusions:  PRN Meds:.acetaminophen, clonazePAM, iohexol, ipratropium, levalbuterol, oxyCODONE-acetaminophen, polyethylene glycol, sodium chloride  Labs:  CMP     Component Value Date/Time   NA 129* 09/02/2011 0437   K 4.0 09/02/2011 0437   CL 89* 09/02/2011 0437   CO2 33* 09/02/2011 0437   GLUCOSE 102* 09/02/2011 0437   BUN 13 09/02/2011 0437   CREATININE 0.76 09/02/2011 0437   CREATININE 1.10 07/29/2011 1711   CALCIUM 9.1 09/02/2011 0437   PROT 7.8 08/31/2011 1631   ALBUMIN 2.0* 08/31/2011 1631   AST 36 08/31/2011 1631   ALT 21 08/31/2011 1631   ALKPHOS 155* 08/31/2011 1631   BILITOT 0.2* 08/31/2011 1631   GFRNONAA >90 09/02/2011 0437   GFRAA >90 09/02/2011 0437     Intake/Output Summary (Last 24 hours) at 09/02/11 1206 Last data filed at 09/02/11 1155  Gross per 24 hour  Intake    870 ml  Output   2000 ml  Net  -1130 ml    Weight Status:  81.7 kg (6/2)  -- stable  Re-estimated needs:  2000-2200 kcals, 100-110 gm protein  Nutrition Dx:  Unintended Weight Loss, ongoing  Goal:  Oral intake to meet >90% of estimated nutrition needs, unmet Monitor: PO & supplemental intake, weight, labs, I/O's  Intervention:    Continue Ensure Complete BID & MVI daily  RD to follow for nutrition care plan  Alger Memos Pager #:  [phone]

## 2011-09-02 NOTE — Progress Notes (Addendum)
Medical Student Daily Progress Note  Subjective: Patient reports he is feeling better this morning, especially compared to when he was transferred. CP on transfer was 10/10 and after NTG x 2 was down to 5/10. Currently he reports CP as 1/10. He has a foley and is diuresing well. His appetite is still rather poor and he says he feels full after just 1 bite of food, however he tries to eat a little more after the feeling of satiety. He did have a temperature yesterday morning (101.2) however has remained afebrile since. He does still note CP on deep inspiration and as a result takes shorter, shallow breaths. He notes that he wishes he could get up to walk as he was doing previously before transfer and does note that he feels much better than Saturday. He is resting in bed comfortably on exam. His wife is bedside.   Objective: Vital signs in last 24 hours: Filed Vitals:   09/02/11 0800 09/02/11 0818 09/02/11 1100 09/02/11 1121  BP:      Pulse: 109  118 102  Temp: 97.8 F (36.6 C)   99.1 F (37.3 C)  TempSrc: Oral   Oral  Resp: 28   25  Height:      Weight:      SpO2: 98% 96%  96%   Weight change:   Intake/Output Summary (Last 24 hours) at 09/02/11 1124 Last data filed at 09/02/11 0900  Gross per 24 hour  Intake    670 ml  Output   2000 ml  Net  -1330 ml   Physical Exam: BP 117/86  Pulse 102  Temp(Src) 99.1 F (37.3 C) (Oral)  Resp 25  Ht 5\' 10"  (1.778 m)  Wt 81.7 kg (180 lb 1.9 oz)  BMI 25.84 kg/m2  SpO2 96% General appearance: alert, cooperative and no distress Head: Normocephalic, without obvious abnormality, atraumatic Neck: no adenopathy, no carotid bruit, no JVD, supple, symmetrical, trachea midline and thyroid not enlarged, symmetric, no tenderness/mass/nodules Back: symmetric, no curvature. ROM normal. No CVA tenderness. Lungs: rales base - right Chest wall: no tenderness Heart: regular rate and rhythm, S1, S2 normal, no murmur, click, rub or gallop Abdomen: soft,  non-tender; bowel sounds normal; no masses,  no organomegaly Extremities: extremities normal, atraumatic, no cyanosis or edema Skin: Skin color, texture, turgor normal. No rashes or lesions Lab Results: Basic Metabolic Panel:  Lab 09/02/11 1610 09/01/11 0500  NA 129* 132*  K 4.0 4.2  CL 89* 91*  CO2 33* 31  GLUCOSE 102* 108*  BUN 13 16  CREATININE 0.76 0.75  CALCIUM 9.1 9.2  MG -- --  PHOS -- --   Liver Function Tests:  Lab 08/31/11 1631 08/28/11 0600  AST 36 26  ALT 21 13  ALKPHOS 155* 95  BILITOT 0.2* 0.3  PROT 7.8 7.8  ALBUMIN 2.0* 2.1*    Lab 08/29/11 1105  LIPASE --  AMYLASE 49   No results found for this basename: AMMONIA:2 in the last 168 hours CBC:  Lab 09/02/11 0437 09/01/11 0500 08/31/11 1631  WBC 9.8 9.5 --  NEUTROABS 7.3 -- 6.5  HGB 8.0* 8.2* --  HCT 25.4* 26.6* --  MCV 79.1 79.9 --  PLT 322 304 --   Cardiac Enzymes:  Lab 09/01/11 0500 08/30/11 0434 08/29/11 2014  CKTOTAL <7* 9 9  CKMB 1.1 1.3 1.2  CKMBINDEX -- -- --  TROPONINI <0.30 <0.30 <0.30   BNP:  Lab 09/01/11 0500 08/27/11 2100  PROBNP 973.1* 1011.0*  D-Dimer: No results found for this basename: DDIMER:2 in the last 168 hours CBG: No results found for this basename: GLUCAP:6 in the last 168 hours Hemoglobin A1C: No results found for this basename: HGBA1C in the last 168 hours Fasting Lipid Panel: No results found for this basename: CHOL,HDL,LDLCALC,TRIG,CHOLHDL,LDLDIRECT in the last 841 hours Thyroid Function Tests: No results found for this basename: TSH,T4TOTAL,FREET4,T3FREE,THYROIDAB in the last 168 hours Coagulation:  Lab 08/27/11 2100  LABPROT 16.6*  INR 1.32   Anemia Panel: No results found for this basename: VITAMINB12,FOLATE,FERRITIN,TIBC,IRON,RETICCTPCT in the last 168 hours Urine Drug Screen: Drugs of Abuse  No results found for this basename: labopia,  cocainscrnur,  labbenz,  amphetmu,  thcu,  labbarb    Alcohol Level: No results found for this basename:  ETH:2 in the last 168 hours Urinalysis:  Lab 08/31/11 1557 08/28/11 0611  COLORURINE YELLOW YELLOW  LABSPEC 1.020 1.031*  PHURINE 7.0 6.0  GLUCOSEU NEGATIVE NEGATIVE  HGBUR NEGATIVE NEGATIVE  BILIRUBINUR NEGATIVE NEGATIVE  KETONESUR NEGATIVE NEGATIVE  PROTEINUR NEGATIVE NEGATIVE  UROBILINOGEN 1.0 0.2  NITRITE NEGATIVE NEGATIVE  LEUKOCYTESUR NEGATIVE NEGATIVE    Micro Results: Recent Results (from the past 240 hour(s))  MRSA PCR SCREENING     Status: Normal   Collection Time   08/27/11  5:41 PM      Component Value Range Status Comment   MRSA by PCR NEGATIVE  NEGATIVE  Final   CULTURE, BLOOD (ROUTINE X 2)     Status: Normal (Preliminary result)   Collection Time   08/27/11  9:53 PM      Component Value Range Status Comment   Specimen Description BLOOD LEFT FOOT   Final    Special Requests BOTTLES DRAWN AEROBIC AND ANAEROBIC 5CC   Final    Culture  Setup Time [id number]   Final    Culture     Final    Value:        BLOOD CULTURE RECEIVED NO GROWTH TO DATE CULTURE WILL BE HELD FOR 5 DAYS BEFORE ISSUING A FINAL NEGATIVE REPORT   Report Status PENDING   Incomplete   CULTURE, BLOOD (ROUTINE X 2)     Status: Normal (Preliminary result)   Collection Time   08/27/11  9:59 PM      Component Value Range Status Comment   Specimen Description BLOOD RIGHT FOOT   Final    Special Requests BOTTLES DRAWN AEROBIC AND ANAEROBIC 5CC   Final    Culture  Setup Time [id number]   Final    Culture     Final    Value:        BLOOD CULTURE RECEIVED NO GROWTH TO DATE CULTURE WILL BE HELD FOR 5 DAYS BEFORE ISSUING A FINAL NEGATIVE REPORT   Report Status PENDING   Incomplete   BODY FLUID CULTURE     Status: Normal   Collection Time   08/28/11  3:54 PM      Component Value Range Status Comment   Specimen Description PLEURAL FLUID RIGHT   Final    Special Requests SYR   Final    Gram Stain     Final    Value: RARE WBC PRESENT,BOTH PMN AND MONONUCLEAR     NO ORGANISMS SEEN   Culture NO  GROWTH 3 DAYS   Final    Report Status 09/01/2011 FINAL   Final   FUNGUS CULTURE W SMEAR     Status: Normal (Preliminary result)   Collection Time   08/28/11  3:55  PM      Component Value Range Status Comment   Specimen Description PLEURAL FLUID RIGHT   Final    Special Requests SYR   Final    Fungal Smear NO YEAST OR FUNGAL ELEMENTS SEEN   Final    Culture CULTURE IN PROGRESS FOR FOUR WEEKS   Final    Report Status PENDING   Incomplete   ANAEROBIC CULTURE     Status: Normal   Collection Time   08/28/11  3:55 PM      Component Value Range Status Comment   Specimen Description PLEURAL FLUID RIGHT   Final    Special Requests SYR   Final    Gram Stain     Final    Value: RARE WBC PRESENT,BOTH PMN AND MONONUCLEAR     NO ORGANISMS SEEN   Culture NO ANAEROBES ISOLATED   Final    Report Status 09/02/2011 FINAL   Final   AFB CULTURE WITH SMEAR     Status: Normal (Preliminary result)   Collection Time   08/28/11  3:56 PM      Component Value Range Status Comment   Specimen Description PLEURAL FLUID RIGHT   Final    Special Requests SYR   Final    ACID FAST SMEAR NO ACID FAST BACILLI SEEN   Final    Culture     Final    Value: CULTURE WILL BE EXAMINED FOR 6 WEEKS BEFORE ISSUING A FINAL REPORT   Report Status PENDING   Incomplete   URINE CULTURE     Status: Normal (Preliminary result)   Collection Time   08/31/11  3:57 PM      Component Value Range Status Comment   Specimen Description URINE, RANDOM   Final    Special Requests NONE   Final    Culture  Setup Time [id number]   Final    Colony Count 85,000 COLONIES/ML   Final    Culture GRAM NEGATIVE RODS   Final    Report Status PENDING   Incomplete   CULTURE, BLOOD (ROUTINE X 2)     Status: Normal (Preliminary result)   Collection Time   08/31/11  4:07 PM      Component Value Range Status Comment   Specimen Description BLOOD RIGHT HAND   Final    Special Requests BOTTLES DRAWN AEROBIC AND ANAEROBIC 8CC   Final    Culture   Setup Time [id number]   Final    Culture     Final    Value:        BLOOD CULTURE RECEIVED NO GROWTH TO DATE CULTURE WILL BE HELD FOR 5 DAYS BEFORE ISSUING A FINAL NEGATIVE REPORT   Report Status PENDING   Incomplete   CULTURE, BLOOD (ROUTINE X 2)     Status: Normal (Preliminary result)   Collection Time   08/31/11  4:25 PM      Component Value Range Status Comment   Specimen Description BLOOD RIGHT FOOT   Final    Special Requests BOTTLES DRAWN AEROBIC AND ANAEROBIC 10CC   Final    Culture  Setup Time [id number]   Final    Culture     Final    Value:        BLOOD CULTURE RECEIVED NO GROWTH TO DATE CULTURE WILL BE HELD FOR 5 DAYS BEFORE ISSUING A FINAL NEGATIVE REPORT   Report Status PENDING   Incomplete    Studies/Results: Dg Chest 2 View  09/01/2011  *RADIOLOGY REPORT*  Clinical Data: Shortness of breath.  CHEST - 2 VIEW  Comparison: 08/31/2011  Findings: Confluent airspace opacity in the right lower lobe again noted, with patchy bilateral opacities diffusely.  Opacities have increased since prior study.  Possible small right effusion.  Right PICC line is unchanged with the tip in the right atrium.  Prior CABG. Heart is borderline in size.  IMPRESSION: Increasing patchy bilateral airspace opacities, most confluent in the right lower lobe.  Findings concerning for pneumonia.  Suspect small right effusion.  Original Report Authenticated By: Cyndie Chime, M.D.   Ct Chest W Contrast  09/01/2011  *RADIOLOGY REPORT*  Clinical Data: Follow-up progression of necrotic area.  CT CHEST WITH CONTRAST  Technique:  Multidetector CT imaging of the chest was performed following the standard protocol during bolus administration of intravenous contrast.  Contrast:   80 ml Omnipaque 300 IV.  Comparison: Chest CT 08/18/2011  Findings: Again noted is the large central right pulmonary embolus with extension into the right lower lobe and right middle lobe pulmonary arteries, unchanged.  Embolus and occlusion of  left upper lobe pulmonary arterial branches also again noted.  Extensive airspace disease throughout the right lower lobe.  Moderate right pleural effusion, possibly partially loculated.  Airspace disease in the right lower lobe has improved slightly since prior study. Patchy areas of airspace disease in the left lung, particularly the left lower lobe are unchanged.  Increasing wedge-shaped ground- glass opacity in the left upper lobe anteriorly, possibly infarct.  Small scattered mediastinal lymph nodes, none pathologically enlarged.  No hilar or axillary adenopathy.  Visualized thyroid and chest wall soft tissues unremarkable. Imaging into the upper abdomen shows no acute findings.  IMPRESSION: Large central right pulmonary embolus again noted, unchanged. Slight improvement in airspace disease within the right lower lobe. Right effusion, partially loculated, stable.  Left lung pulmonary emboli and ground-glass opacities again noted. New small wedge-shaped ground-glass opacity in the anterior left upper lobe, likely related to the emboli and pulmonary infarct.  Original Report Authenticated By: Cyndie Chime, M.D.   Dg Chest Bilateral Decubitus  09/01/2011  *RADIOLOGY REPORT*  Clinical Data: Shortness of breath.  Pleural effusions, status post thoracentesis.  CHEST - BILATERAL DECUBITUS VIEW  Comparison: Chest x-ray earlier today and 08/31/2011  Findings: There is a small to moderate layering right pleural effusion noted on the decubitus view.  IMPRESSION: Small to moderate layering right effusion.  Original Report Authenticated By: Cyndie Chime, M.D.   Dg Chest Port 1 View  08/31/2011  *RADIOLOGY REPORT*  Clinical Data: Follow-up.  CHF.  PORTABLE CHEST - 1 VIEW  Comparison: 08/29/2011  Findings: Continued opacity in the right lower lung, likely pneumonia.  Heart is mildly enlarged.  Prior CABG.  No confluent opacity on the left.  Right PICC line remains in place with the tip in the right atrium.  Possible  small right effusion.  IMPRESSION: Continued right lower lobe consolidation concerning for pneumonia. Possible small right effusion.  Original Report Authenticated By: Cyndie Chime, M.D.   Medications:  I have reviewed the patient's current medications. Prior to Admission:  Prescriptions prior to admission  Medication Sig Dispense Refill  . acetaminophen (TYLENOL) 325 MG tablet Take 325-650 mg by mouth every 4 (four) hours as needed. For pain/fever      . Ascorbic Acid (VITAMIN C) 100 MG tablet Take 100 mg by mouth daily.      [name] aspirin EC 81 MG EC tablet Take  1 tablet (81 mg total) by mouth daily.      . calcium carbonate (TUMS - DOSED IN MG ELEMENTAL CALCIUM) 500 MG chewable tablet Chew 1 tablet by mouth 3 (three) times daily.      . clonazePAM (KLONOPIN) 0.5 MG tablet Take 1 tablet (0.5 mg total) by mouth 2 (two) times daily as needed for anxiety.  30 tablet  1  . esomeprazole (NEXIUM) 40 MG capsule Take 40 mg by mouth 2 (two) times daily.       . famotidine (PEPCID) 10 MG tablet Take 10 mg by mouth 2 (two) times daily.      . feeding supplement (ENSURE COMPLETE) LIQD Take 237 mLs by mouth 2 (two) times daily between meals.      . Flaxseed, Linseed, 1000 MG CAPS Take 1 capsule (1,000 mg total) by mouth daily.      . furosemide (LASIX) 40 MG tablet Take 1 tablet (40 mg total) by mouth daily.  30 tablet  3  . metoprolol (LOPRESSOR) 50 MG tablet Take 0.5 tablets (25 mg total) by mouth 2 (two) times daily.  30 tablet  1  . Multiple Vitamins-Minerals (MULTIVITAMIN) tablet Take 1 tablet by mouth daily with breakfast.   30 tablet    . oxyCODONE-acetaminophen (PERCOCET) 5-325 MG per tablet Take 1 tablet by mouth every 6 (six) hours as needed for pain. Do not take more than 4000 mg of acetaminophen in 24 hours. Both Tylenol and Percocet contain acetaminophen.  32 tablet  0  . sodium chloride 0.9 % SOLN 100 mL with micafungin 50 MG SOLR 150 mg Inject 150 mg into the vein daily. Dispense quantity  sufficient to treat until on 09/26/2011. Patient is to receive Micafungin via PICC line daily. Via home health agency. PROTECT FROM LIGHT - DO NOT SHAKE  34 application  0  . DISCONTD: acetaminophen (TYLENOL) 325 MG tablet Take 1-2 tablets (325-650 mg total) by mouth every 4 (four) hours as needed.      . polyethylene glycol (MIRALAX / GLYCOLAX) packet Take 17 g by mouth daily as needed.  14 each  11   Anti-infectives     Start     Dose/Rate Route Frequency Ordered Stop   09/02/11 1200   vancomycin (VANCOCIN) 1,250 mg in sodium chloride 0.9 % 250 mL IVPB        1,250 mg 166.7 mL/hr over 90 Minutes Intravenous Every 12 hours 09/02/11 1001     08/31/11 1600   vancomycin (VANCOCIN) IVPB 1000 mg/200 mL premix  Status:  Discontinued        1,000 mg 200 mL/hr over 60 Minutes Intravenous Every 8 hours 08/31/11 1527 09/02/11 1001   08/31/11 1600  piperacillin-tazobactam (ZOSYN) IVPB 3.375 g       3.375 g 12.5 mL/hr over 240 Minutes Intravenous 3 times per day 08/31/11 1527     08/27/11 2200   micafungin (MYCAMINE) 150 mg in sodium chloride 0.9 % 100 mL IVPB        150 mg 100 mL/hr over 1 Hours Intravenous Every 24 hours 08/27/11 2031           Scheduled Meds:    . albuterol  2 puff Inhalation Q6H  . aspirin EC  81 mg Oral Daily  . calcium carbonate  1 tablet Oral TID WC  . docusate sodium  100 mg Oral BID  . enoxaparin (LOVENOX) injection  40 mg Subcutaneous Q24H  . feeding supplement  237 mL Oral BID BM  .  furosemide  60 mg Oral Daily  . metoprolol  50 mg Oral BID  . micafungin (MYCAMINE) IV  150 mg Intravenous Q24H  . mulitivitamin with minerals  1 tablet Oral Daily  . nitroGLYCERIN      . pantoprazole  40 mg Oral Q1200  . piperacillin-tazobactam (ZOSYN)  IV  3.375 g Intravenous Q8H  . sodium chloride  3 mL Intravenous Q12H  . traZODone  50 mg Oral QHS  . vancomycin  1,250 mg Intravenous Q12H  . vitamin C  250 mg Oral Daily  . DISCONTD: spironolactone  25 mg Oral Daily  .  DISCONTD: vancomycin  1,000 mg Intravenous Q8H   Continuous Infusions:  PRN Meds:.acetaminophen, clonazePAM, iohexol, ipratropium, levalbuterol, oxyCODONE-acetaminophen, polyethylene glycol, sodium chloride Assessment/Plan:  Pleural effusion / pulmonary embolism / pulmonary infarction - Patient is s/p thoracentesis (08/28/11) for R effusion. Most recent CT chest (09/01/11) shows unchanged central right PA Candidal embolus (extension to RML, RLL), and embolus/occlusion of LUL pulmonary artery branches. Moderate right pleural effusion present (possibly loculated). Wedge shaped infarcts noted in RML and LUL.  - continue percocet 5/325 q6prn and tylenol 325-650mg  prn for pain - continue albuterol 2 puffs QID for added bronchodilation - continue atrovent q4prn / xopenex q6prn nebs  - continue lasix 60 mg daily - Pleural fluid AFB cx, fungal stain and cx, anerobic culture: NTD  Acute on chronic systolic CHF 2/2 CAD and ischemic cardiomyopathy - 2D echo (08/23/11) shows EF = 30% with diffuse hypokinesis, mild LVH, and moderately dilated LA. 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  - continue ASA 81 mg daily - continue metoprolol 50 BID - continue lasix 60 daily (has foley)  Questionable HAP - Patient spiked fever and had relative leukocytosis on 08/31/11 with RLL consolidation seen on CXR as well. Concerning for pneumonia and/or small effusion - continue vanc and zosyn (08/31/11>>) - blood cultures x 2 : NTD  Fever - Patient had Candidal embolus (from atrial lead on former ICD) to R pulmonary artery on 08/15/11. Fevers likely due to gradual pieces of the mass breaking off, with appropriate immune response causing him to have fevers every couple of days. Question of his old pacer pocket having an abscess/infection low possibility at this point but has been considered (no warmth, erythema, tenderness, disproportionate swelling).  - blood cultures x 2 pending: NTD  - continue  micafungin x 6 weeks (08/15/11>>)  - Candida sensitivities are back from reference lab: sensitive to both micafungin and fluconazole - will discuss with ID as to antifungal regimen  GERD / Esophageal Candidiasis - Stable. He does note some minor discomfort with swallowing, however he also states that the dilation on 08/13/11 was not a complete dilation that he is used to. - continue micafungin  - continue Protonix 40mg  daily  Tachycardia - Patient continues to have episodes of tachycardia (90's -110's). Stress response and anxiety likely a component of this.  - metoprolol 50mg  BID  Anxiety - Patient remains anxious and sleep deprived. His anxiety medicine does work when he takes it.  - continue clonazepam   Insomnia - Patient reports not sleeping well at night. He does note that his PRN percocet does make him sleepy so I advised him to try taking his dose later in the evening to help him with sleep.  - patient will ask for percocet before bedtime  - continue trazodone 50 mg qhs   Normocytic anemia - Resolved. MCV 79.1. On his  last admission his anemia panel showed AOCD that seems to have resolved, continues to resolve. We will continue to monitor his CBC and as needed.  - continue to check CBC   Hyponatremia - Na = 129 today. Likely 2/2 his CHF and removal of his biV ICD.  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 His IgM and IgA were increased during his last admission, which could likely be due to his acute illnesses. Hematology consult during previous admission reccommended follow-up after his current infection is completely resolved.  - repeat SPEP/UPEP after full resolve of Candida   Disposition - Patient is brittle. He ambulates at times but gets tired fast (pulmonary component). Will d/w ID today regarding antifungal change. He may also be able to transfer, will d/w Dr. Meredith Pel.   LOS: 6 days   This is a Psychologist, occupational Note.  The care of the patient was  discussed with Dr. Margorie John and the assessment and plan formulated with their assistance.  Please see their attached note for official documentation of the daily encounter.  Lewie Chamber 09/02/2011, 11:24 AM   Resident Addendum to Medical Student Note   I have seen and examined the patient, and agree with the the medical student assessment and plan outlined above. Please see my brief note below for additional details.  HPI: Patient is sleeping comfortably, does not awaken during physical exam.  OBJECTIVE: VS: Reviewed  Meds: Reviewed  Labs: Reviewed  Imaging: Reviewed   Physical Exam: General: Vital signs reviewed and noted. Middle aged male, with audible breathing and subcostal retractions.  Lungs:  Mildly increased respiratory effort. Clear to auscultation BL without crackles or wheezes.  Heart: RRR. S1 and S2 normal without gallop, murmur, or rubs.  Abdomen:  BS normoactive. Soft, Nondistended, non-tender.   Extremities: No pretibial edema.     ASSESSMENT/ PLAN:  Patient is feeling better in terms of his respiratory status. Chest CT shows increased area of infarction. Will get LE dopplers and consulted cardiothoracic surgery for possible partial lobectomy of necrotic tissue. Patient has been afebrile. Changed micafungin to fluconazole based on culture data. Physical therapy had not recommended SNF placement. Ordered repeat 2D Echo given potential valvular spread of candidemia.  Length of Stay: 6   Margorie John, M.D. [facility], Internal Medicine Resident 09/02/2011, 4:45 PM

## 2011-09-02 NOTE — Progress Notes (Signed)
Clinical Social Work-CSW received word from tx team- pt is not able to afford IV anti-fungal medication at home  ($318.00 per week) CSW will contact insurance to identify if they are able to authorize SNF although as of Friday pt had not pt/ot needs. CSW spoke with med assist program who relayed that they do not manage or assist with any IV medications.  CSW contacted Pharmacologist and they do not offer assistance to pt-they offer reimbursement to health system once system enrolls and once pt has completed full course-this is assuming pt meets criteria which is doubtful as pt is insured.  CSW discussed case with Chiropodist and CM on telemetry and has now discussed with CM on 2900-  CSW will continue to work with CM to identify any support we can offer to pt in order to acquire medications Jodean Lima, [phone]
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
## 2011-09-03 DIAGNOSIS — I82409 Acute embolism and thrombosis of unspecified deep veins of unspecified lower extremity: Secondary | ICD-10-CM | POA: Diagnosis not present

## 2011-09-03 DIAGNOSIS — I2690 Septic pulmonary embolism without acute cor pulmonale: Secondary | ICD-10-CM

## 2011-09-03 LAB — URINE CULTURE: Colony Count: 85000

## 2011-09-03 LAB — CULTURE, BLOOD (ROUTINE X 2)
Culture  Setup Time: 201305290354
Culture: NO GROWTH

## 2011-09-03 LAB — BASIC METABOLIC PANEL
Calcium: 8.9 mg/dL (ref 8.4–10.5)
GFR calc Af Amer: >90 mL/min (ref >90)
GFR calc non Af Amer: 89 mL/min — ABNORMAL LOW (ref >90)
Potassium: 4.2 mEq/L (ref 3.5–5.1)
Sodium: 131 mEq/L — ABNORMAL LOW (ref 135–145)

## 2011-09-03 LAB — CBC
MCH: 24.6 pg — ABNORMAL LOW (ref 26.0–34.0)
MCHC: 31 g/dL (ref 30.0–36.0)
RDW: 15.8 % — ABNORMAL HIGH (ref 11.5–15.5)

## 2011-09-03 MED ORDER — SODIUM CHLORIDE 0.9 % IV SOLN
INTRAVENOUS | Status: DC
Start: 2011-09-03 — End: 2011-09-13
  Administered 2011-09-03: 10 mL/h via INTRAVENOUS
  Administered 2011-09-05 – 2011-09-13 (×2): 20 mL/h via INTRAVENOUS

## 2011-09-03 MED ORDER — OXYCODONE-ACETAMINOPHEN 5-325 MG PO TABS
1.0000 | ORAL_TABLET | Freq: Once | ORAL | Status: AC
Start: 2011-09-03 — End: 2011-09-03
  Administered 2011-09-03: 1 via ORAL

## 2011-09-03 MED ORDER — ALPRAZOLAM 0.25 MG PO TABS
0.2500 mg | ORAL_TABLET | Freq: Once | ORAL | Status: AC
  Administered 2011-09-03: 0.25 mg via ORAL
  Filled 2011-09-03: qty 1

## 2011-09-03 MED ORDER — OXYCODONE-ACETAMINOPHEN 5-325 MG PO TABS
1.0000 | ORAL_TABLET | Freq: Four times a day (QID) | ORAL | Status: DC | PRN
  Administered 2011-09-03 – 2011-09-06 (×6): 1 via ORAL
  Administered 2011-09-07: 2 via ORAL
  Administered 2011-09-08 (×2): 1 via ORAL
  Administered 2011-09-09: 2 via ORAL
  Administered 2011-09-10 – 2011-09-13 (×3): 1 via ORAL
  Filled 2011-09-03: qty 1
  Filled 2011-09-03: qty 2
  Filled 2011-09-03: qty 1
  Filled 2011-09-03: qty 2
  Filled 2011-09-03: qty 1
  Filled 2011-09-03: qty 2
  Filled 2011-09-03 (×2): qty 1
  Filled 2011-09-03: qty 2
  Filled 2011-09-03: qty 1
  Filled 2011-09-03: qty 2
  Filled 2011-09-03 (×3): qty 1
  Filled 2011-09-03 (×2): qty 2
  Filled 2011-09-03 (×2): qty 1

## 2011-09-03 MED ORDER — MORPHINE SULFATE 2 MG/ML IJ SOLN: 1.0000 mg | INTRAMUSCULAR | Status: DC | PRN

## 2011-09-03 NOTE — Progress Notes (Signed)
Medical Student Daily Progress Note  Subjective: Patient had increased HR (103) and RR (24-32) overnight. He was given a dose of xanax 0.25mg  that allowed him to fall back asleep. He woke up confused however this resolved after he was able to wake up some more and collect this thoughts. His appetite is a little better this morning and he is able to eat almost all his breakfast. He is voiding on his own and is having BMs with no straining. He is resting in bed comfortably on exam.  Objective: Vital signs in last 24 hours: Filed Vitals:   09/03/11 0349 09/03/11 0353 09/03/11 0554 09/03/11 0800  BP: 109/51   128/62  Pulse:  103  105  Temp: 99.4 F (37.4 C) 99.4 F (37.4 C)  99.5 F (37.5 C)  TempSrc: Oral Oral  Oral  Resp:  30  34  Height:      Weight:   82.555 kg (182 lb)   SpO2:  96%  100%   Weight change:   Intake/Output Summary (Last 24 hours) at 09/03/11 0834 Last data filed at 09/03/11 0514  Gross per 24 hour  Intake   1820 ml  Output   1501 ml  Net    319 ml   Physical Exam: BP 128/62  Pulse 105  Temp(Src) 99.5 F (37.5 C) (Oral)  Resp 34  Ht 5\' 10"  (1.778 m)  Wt 82.555 kg (182 lb)  BMI 26.11 kg/m2  SpO2 100% General appearance: alert, cooperative and no distress Head: Normocephalic, without obvious abnormality, atraumatic Neck: no adenopathy, no carotid bruit, no JVD, supple, symmetrical, trachea midline and thyroid not enlarged, symmetric, no tenderness/mass/nodules Back: symmetric, no curvature. ROM normal. No CVA tenderness. Lungs: rales apex - left and RLL Chest wall: no tenderness Heart: regular rate and rhythm, S1, S2 normal, no murmur, click, rub or gallop Abdomen: soft, non-tender; bowel sounds normal; no masses,  no organomegaly Extremities: extremities normal, atraumatic, no cyanosis or edema Skin: Skin color, texture, turgor normal. No rashes or lesions Lab Results: Basic Metabolic Panel:  Lab 09/03/11 0454 09/02/11 0437  NA 131* 129*  K 4.2 4.0    CL 93* 89*  CO2 30 33*  GLUCOSE 106* 102*  BUN 15 13  CREATININE 0.85 0.76  CALCIUM 8.9 9.1  MG -- --  PHOS -- --   Liver Function Tests:  Lab 08/31/11 1631 08/28/11 0600  AST 36 26  ALT 21 13  ALKPHOS 155* 95  BILITOT 0.2* 0.3  PROT 7.8 7.8  ALBUMIN 2.0* 2.1*    Lab 08/29/11 1105  LIPASE --  AMYLASE 49   No results found for this basename: AMMONIA:2 in the last 168 hours CBC:  Lab 09/03/11 0500 09/02/11 0437 08/31/11 1631  WBC 9.9 9.8 --  NEUTROABS -- 7.3 6.5  HGB 7.6* 8.0* --  HCT 24.5* 25.4* --  MCV 79.3 79.1 --  PLT 315 322 --   Cardiac Enzymes:  Lab 09/01/11 0500 08/30/11 0434 08/29/11 2014  CKTOTAL <7* 9 9  CKMB 1.1 1.3 1.2  CKMBINDEX -- -- --  TROPONINI <0.30 <0.30 <0.30   BNP:  Lab 09/01/11 0500 08/27/11 2100  PROBNP 973.1* 1011.0*   D-Dimer: No results found for this basename: DDIMER:2 in the last 168 hours CBG: No results found for this basename: GLUCAP:6 in the last 168 hours Hemoglobin A1C: No results found for this basename: HGBA1C in the last 168 hours Fasting Lipid Panel: No results found for this basename: CHOL,HDL,LDLCALC,TRIG,CHOLHDL,LDLDIRECT in the last  168 hours Thyroid Function Tests: No results found for this basename: TSH,T4TOTAL,FREET4,T3FREE,THYROIDAB in the last 168 hours Coagulation:  Lab 08/27/11 2100  LABPROT 16.6*  INR 1.32   Anemia Panel: No results found for this basename: VITAMINB12,FOLATE,FERRITIN,TIBC,IRON,RETICCTPCT in the last 168 hours Urine Drug Screen: Drugs of Abuse  No results found for this basename: labopia, cocainscrnur, labbenz, amphetmu, thcu, labbarb    Alcohol Level: No results found for this basename: ETH:2 in the last 168 hours Urinalysis:  Lab 08/31/11 1557 08/28/11 0611  COLORURINE YELLOW YELLOW  LABSPEC 1.020 1.031*  PHURINE 7.0 6.0  GLUCOSEU NEGATIVE NEGATIVE  HGBUR NEGATIVE NEGATIVE  BILIRUBINUR NEGATIVE NEGATIVE  KETONESUR NEGATIVE NEGATIVE  PROTEINUR NEGATIVE NEGATIVE   UROBILINOGEN 1.0 0.2  NITRITE NEGATIVE NEGATIVE  LEUKOCYTESUR NEGATIVE NEGATIVE    Micro Results: Recent Results (from the past 240 hour(s))  MRSA PCR SCREENING     Status: Normal   Collection Time   08/27/11  5:41 PM      Component Value Range Status Comment   MRSA by PCR NEGATIVE  NEGATIVE  Final   CULTURE, BLOOD (ROUTINE X 2)     Status: Normal (Preliminary result)   Collection Time   08/27/11  9:53 PM      Component Value Range Status Comment   Specimen Description BLOOD LEFT FOOT   Final    Special Requests BOTTLES DRAWN AEROBIC AND ANAEROBIC 5CC   Final    Culture  Setup Time [id number]   Final    Culture     Final    Value:        BLOOD CULTURE RECEIVED NO GROWTH TO DATE CULTURE WILL BE HELD FOR 5 DAYS BEFORE ISSUING A FINAL NEGATIVE REPORT   Report Status PENDING   Incomplete   CULTURE, BLOOD (ROUTINE X 2)     Status: Normal (Preliminary result)   Collection Time   08/27/11  9:59 PM      Component Value Range Status Comment   Specimen Description BLOOD RIGHT FOOT   Final    Special Requests BOTTLES DRAWN AEROBIC AND ANAEROBIC 5CC   Final    Culture  Setup Time [id number]   Final    Culture     Final    Value:        BLOOD CULTURE RECEIVED NO GROWTH TO DATE CULTURE WILL BE HELD FOR 5 DAYS BEFORE ISSUING A FINAL NEGATIVE REPORT   Report Status PENDING   Incomplete   BODY FLUID CULTURE     Status: Normal   Collection Time   08/28/11  3:54 PM      Component Value Range Status Comment   Specimen Description PLEURAL FLUID RIGHT   Final    Special Requests SYR   Final    Gram Stain     Final    Value: RARE WBC PRESENT,BOTH PMN AND MONONUCLEAR     NO ORGANISMS SEEN   Culture NO GROWTH 3 DAYS   Final    Report Status 09/01/2011 FINAL   Final   FUNGUS CULTURE W SMEAR     Status: Normal (Preliminary result)   Collection Time   08/28/11  3:55 PM      Component Value Range Status Comment   Specimen Description PLEURAL FLUID RIGHT   Final    Special Requests SYR    Final    Fungal Smear NO YEAST OR FUNGAL ELEMENTS SEEN   Final    Culture CULTURE IN PROGRESS FOR FOUR WEEKS   Final  Report Status PENDING   Incomplete   ANAEROBIC CULTURE     Status: Normal   Collection Time   08/28/11  3:55 PM      Component Value Range Status Comment   Specimen Description PLEURAL FLUID RIGHT   Final    Special Requests SYR   Final    Gram Stain     Final    Value: RARE WBC PRESENT,BOTH PMN AND MONONUCLEAR     NO ORGANISMS SEEN   Culture NO ANAEROBES ISOLATED   Final    Report Status 09/02/2011 FINAL   Final   AFB CULTURE WITH SMEAR     Status: Normal (Preliminary result)   Collection Time   08/28/11  3:56 PM      Component Value Range Status Comment   Specimen Description PLEURAL FLUID RIGHT   Final    Special Requests SYR   Final    ACID FAST SMEAR NO ACID FAST BACILLI SEEN   Final    Culture     Final    Value: CULTURE WILL BE EXAMINED FOR 6 WEEKS BEFORE ISSUING A FINAL REPORT   Report Status PENDING   Incomplete   URINE CULTURE     Status: Normal   Collection Time   08/31/11  3:57 PM      Component Value Range Status Comment   Specimen Description URINE, RANDOM   Final    Special Requests NONE   Final    Culture  Setup Time [id number]   Final    Colony Count 85,000 COLONIES/ML   Final    Culture KLEBSIELLA PNEUMONIAE   Final    Report Status 09/03/2011 FINAL   Final    Organism ID, Bacteria KLEBSIELLA PNEUMONIAE   Final   CULTURE, BLOOD (ROUTINE X 2)     Status: Normal (Preliminary result)   Collection Time   08/31/11  4:07 PM      Component Value Range Status Comment   Specimen Description BLOOD RIGHT HAND   Final    Special Requests BOTTLES DRAWN AEROBIC AND ANAEROBIC 8CC   Final    Culture  Setup Time [id number]   Final    Culture     Final    Value:        BLOOD CULTURE RECEIVED NO GROWTH TO DATE CULTURE WILL BE HELD FOR 5 DAYS BEFORE ISSUING A FINAL NEGATIVE REPORT   Report Status PENDING   Incomplete   CULTURE, BLOOD (ROUTINE X 2)      Status: Normal (Preliminary result)   Collection Time   08/31/11  4:25 PM      Component Value Range Status Comment   Specimen Description BLOOD RIGHT FOOT   Final    Special Requests BOTTLES DRAWN AEROBIC AND ANAEROBIC 10CC   Final    Culture  Setup Time [id number]   Final    Culture     Final    Value:        BLOOD CULTURE RECEIVED NO GROWTH TO DATE CULTURE WILL BE HELD FOR 5 DAYS BEFORE ISSUING A FINAL NEGATIVE REPORT   Report Status PENDING   Incomplete    Studies/Results: Dg Chest 2 View  09/01/2011  *RADIOLOGY REPORT*  Clinical Data: Shortness of breath.  CHEST - 2 VIEW  Comparison: 08/31/2011  Findings: Confluent airspace opacity in the right lower lobe again noted, with patchy bilateral opacities diffusely.  Opacities have increased since prior study.  Possible small right effusion.  Right PICC line is  unchanged with the tip in the right atrium.  Prior CABG. Heart is borderline in size.  IMPRESSION: Increasing patchy bilateral airspace opacities, most confluent in the right lower lobe.  Findings concerning for pneumonia.  Suspect small right effusion.  Original Report Authenticated By: Cyndie Chime, M.D.   Ct Chest W Contrast  09/01/2011  *RADIOLOGY REPORT*  Clinical Data: Follow-up progression of necrotic area.  CT CHEST WITH CONTRAST  Technique:  Multidetector CT imaging of the chest was performed following the standard protocol during bolus administration of intravenous contrast.  Contrast:   80 ml Omnipaque 300 IV.  Comparison: Chest CT 08/18/2011  Findings: Again noted is the large central right pulmonary embolus with extension into the right lower lobe and right middle lobe pulmonary arteries, unchanged.  Embolus and occlusion of left upper lobe pulmonary arterial branches also again noted.  Extensive airspace disease throughout the right lower lobe.  Moderate right pleural effusion, possibly partially loculated.  Airspace disease in the right lower lobe has improved slightly since  prior study. Patchy areas of airspace disease in the left lung, particularly the left lower lobe are unchanged.  Increasing wedge-shaped ground- glass opacity in the left upper lobe anteriorly, possibly infarct.  Small scattered mediastinal lymph nodes, none pathologically enlarged.  No hilar or axillary adenopathy.  Visualized thyroid and chest wall soft tissues unremarkable. Imaging into the upper abdomen shows no acute findings.  IMPRESSION: Large central right pulmonary embolus again noted, unchanged. Slight improvement in airspace disease within the right lower lobe. Right effusion, partially loculated, stable.  Left lung pulmonary emboli and ground-glass opacities again noted. New small wedge-shaped ground-glass opacity in the anterior left upper lobe, likely related to the emboli and pulmonary infarct.  Original Report Authenticated By: Cyndie Chime, M.D.   Dg Chest Bilateral Decubitus  09/01/2011  *RADIOLOGY REPORT*  Clinical Data: Shortness of breath.  Pleural effusions, status post thoracentesis.  CHEST - BILATERAL DECUBITUS VIEW  Comparison: Chest x-ray earlier today and 08/31/2011  Findings: There is a small to moderate layering right pleural effusion noted on the decubitus view.  IMPRESSION: Small to moderate layering right effusion.  Original Report Authenticated By: Cyndie Chime, M.D.   Medications:  I have reviewed the patient's current medications. Anti-infectives     Start     Dose/Rate Route Frequency Ordered Stop   09/02/11 1400   fluconazole (DIFLUCAN) IVPB 400 mg        400 mg 200 mL/hr over 60 Minutes Intravenous Every 24 hours 09/02/11 1300     09/02/11 1200   vancomycin (VANCOCIN) 1,250 mg in sodium chloride 0.9 % 250 mL IVPB        1,250 mg 166.7 mL/hr over 90 Minutes Intravenous Every 12 hours 09/02/11 1001     08/31/11 1600   vancomycin (VANCOCIN) IVPB 1000 mg/200 mL premix  Status:  Discontinued        1,000 mg 200 mL/hr over 60 Minutes Intravenous Every 8 hours  08/31/11 1527 09/02/11 1001   08/31/11 1600  piperacillin-tazobactam (ZOSYN) IVPB 3.375 g       3.375 g 12.5 mL/hr over 240 Minutes Intravenous 3 times per day 08/31/11 1527     08/27/11 2200   micafungin (MYCAMINE) 150 mg in sodium chloride 0.9 % 100 mL IVPB  Status:  Discontinued        150 mg 100 mL/hr over 1 Hours Intravenous Every 24 hours 08/27/11 2031 09/02/11 1300         Scheduled  Meds:   . albuterol  2 puff Inhalation Q6H  . ALPRAZolam  0.25 mg Oral Once  . aspirin EC  81 mg Oral Daily  . calcium carbonate  1 tablet Oral TID WC  . docusate sodium  100 mg Oral BID  . enoxaparin (LOVENOX) injection  40 mg Subcutaneous Q24H  . feeding supplement  237 mL Oral BID BM  . fluconazole (DIFLUCAN) IV  400 mg Intravenous Q24H  . furosemide  60 mg Oral Daily  . metoprolol  50 mg Oral BID  . mulitivitamin with minerals  1 tablet Oral Daily  . pantoprazole  40 mg Oral Q1200  . piperacillin-tazobactam (ZOSYN)  IV  3.375 g Intravenous Q8H  . sodium chloride  3 mL Intravenous Q12H  . traZODone  50 mg Oral QHS  . vancomycin  1,250 mg Intravenous Q12H  . vitamin C  250 mg Oral Daily  . DISCONTD: micafungin (MYCAMINE) IV  150 mg Intravenous Q24H  . DISCONTD: vancomycin  1,000 mg Intravenous Q8H   Continuous Infusions:  PRN Meds:.acetaminophen, clonazePAM, ipratropium, levalbuterol, oxyCODONE-acetaminophen, polyethylene glycol, sodium chloride Assessment/Plan:  Pleural effusion / pulmonary embolism / pulmonary infarction - Patient is s/p thoracentesis (08/28/11) for R effusion. Most recent CT chest (09/01/11) shows unchanged central right PA Candidal embolus (extension to RML, RLL), and embolus/occlusion of LUL pulmonary artery branches. Moderate right pleural effusion present (possibly loculated). Wedge shaped infarcts noted in RML and LUL.  - continue percocet 5/325 q6prn and tylenol 325-650mg  prn for pain  - continue albuterol 2 puffs QID for added bronchodilation  - continue atrovent  q4prn / xopenex q6prn nebs  - continue lasix 60 mg daily  - Pleural fluid AFB cx, fungal stain and cx, anerobic culture: NTD   Acute on chronic systolic CHF 2/2 CAD and ischemic cardiomyopathy - 2D echo (08/23/11) shows EF = 30% with diffuse hypokinesis, mild LVH, and moderately dilated LA. 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  - continue ASA 81 mg daily  - continue metoprolol 50 BID  - continue lasix 60 daily--foley removed on 09/02/11 - will obtain repeat TEE tomorrow 09/04/11  Questionable HAP - Patient spiked fever and had relative leukocytosis on 08/31/11 with RLL consolidation seen on CXR as well. Concerning for pneumonia and/or small effusion.   - d/c vanc (08/31/11>>09/03/11) - blood cultures x 2 : NTD   Bacteriuria - Patient's urine culture revealed Klebsiella pneumoniae. 85,000 col/mL. - continue zosyn (08/31/11>>)  Fever - Patient had Candidal embolus (from atrial lead on former ICD) to R pulmonary artery on 08/15/11. Fevers likely due to gradual pieces of the mass breaking off, with appropriate immune response causing him to have fevers every couple of days. Question of his old pacer pocket having an abscess/infection low possibility at this point but has been considered (no warmth, erythema, tenderness, disproportionate swelling).  - Appreciate ID recommendations and consult in the management of this patient - blood cultures x 2 pending: NTD  - change micafungin to IV fluconazole 400 mg daily to finish 6 week course (08/15/11>>)  - Candida sensitivities are back from reference lab: sensitive to both micafungin and fluconazole    GERD / Esophageal Candidiasis - Stable. He does note some minor discomfort with swallowing, however he also states that the dilation on 08/13/11 was not a complete dilation that he is used to.  - continue fluconazole  - continue Protonix 40mg  daily   Tachycardia - Patient continues to have episodes  of tachycardia (90's -110's).  Stress response and anxiety likely a component of this.  - metoprolol 50mg  BID   Anxiety - Patient remains anxious and sleep deprived. His anxiety medicine does work when he takes it.  - continue clonazepam   Insomnia - Patient reports not sleeping well at night. He does note that his PRN percocet does make him sleepy so I advised him to try taking his dose later in the evening to help him with sleep.  - patient will ask for percocet before bedtime  - continue trazodone 50 mg qhs   Normocytic anemia - Hgb 7.6 today. MCV 79.3. 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 Iron = 21, Sat ratio = 9, TIBC =243, Ferritin = 597, Retic count =1.9, FOBT negative. Smear revealed mild anemia with normal morphology. Side effect from micafungin is decreased Hgb (3-10%). - will check FOBT - continue to check CBC   Hyponatremia - Na = 131 today. Likely 2/2 his CHF and removal of his biV ICD.  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 His IgM and IgA were increased during his last admission, which could likely be due to his acute illnesses. Hematology consult during previous admission reccommended follow-up after his current infection is completely resolved.  - repeat SPEP/UPEP after full resolve of Candida   Disposition - Patient is brittle. He ambulates at times but gets tired fast (pulmonary component). He will transfer back to a regular floor today. Will plan for TEE tomorrow   LOS: 7 days   This is a Psychologist, occupational Note.  The care of the patient was discussed with Dr. Margorie John and the assessment and plan formulated with their assistance.  Please see their attached note for official documentation of the daily encounter.  [name] 09/03/2011, 8:34 AM   Resident Addendum to Medical Student Note   I have seen and examined the patient, and agree with the the medical student assessment and plan outlined above. Please see my brief note below for additional  details.  HPI: Patient is feeling better than previously. He is still short of breath and is taking shallow breaths.   OBJECTIVE: VS: Reviewed  Meds: Reviewed  Labs: Reviewed  Imaging: Reviewed   Physical Exam: General: Vital signs reviewed and noted. Well-developed, well-nourished, in no acute distress; alert, appropriate and cooperative throughout examination.  Lungs:  Normal respiratory effort. Dry sounding crackles in Right Middle lung field.  Heart: RRR. S1 and S2 normal without gallop, murmur, or rubs.  Abdomen:  BS normoactive. Soft, Nondistended, non-tender.  No masses or organomegaly.  Extremities: No pretibial edema.     ASSESSMENT/ PLAN:  Transferred patient to telemetry given his continued tachycardia and tachypnea. Patient's LE Doppler were not performed. Will call to reorder these tomorrow. Patient's confusion was likely due to xanax, will avoid giving xanax overnight. Trazodone seems to be working reasonably well for sleep. Patient was letting his pain get out of control. Encouraged him to take pain meds when experiencing moderate pain. Avoiding NSAIDs since patient's stool guiaic was maroon appearing but hemoccult negative. Will monitor CBC tomorrow and coags.   Length of Stay: 7   Margorie John, M.D. [facility], Internal Medicine Resident 09/03/2011, 5:14 PM

## 2011-09-03 NOTE — Progress Notes (Signed)
INFECTIOUS DISEASE PROGRESS NOTE  ID: [name] is a 66 y.o. male with   Principal Problem:  *Candidemia Active Problems:  Chronic systolic congestive heart failure  Normocytic anemia  Esophageal stricture  Thrombocytopenia  Septic embolism  Candidal endocarditis  Fever  Pleural effusion  Malnutrition  Subjective: Without complaints, finished breakfast and ensure.  No SOB, no CP.   Abtx:  Anti-infectives     Start     Dose/Rate Route Frequency Ordered Stop   09/02/11 1400   fluconazole (DIFLUCAN) IVPB 400 mg        400 mg 200 mL/hr over 60 Minutes Intravenous Every 24 hours 09/02/11 1300     09/02/11 1200   vancomycin (VANCOCIN) 1,250 mg in sodium chloride 0.9 % 250 mL IVPB        1,250 mg 166.7 mL/hr over 90 Minutes Intravenous Every 12 hours 09/02/11 1001     08/31/11 1600   vancomycin (VANCOCIN) IVPB 1000 mg/200 mL premix  Status:  Discontinued        1,000 mg 200 mL/hr over 60 Minutes Intravenous Every 8 hours 08/31/11 1527 09/02/11 1001   08/31/11 1600  piperacillin-tazobactam (ZOSYN) IVPB 3.375 g       3.375 g 12.5 mL/hr over 240 Minutes Intravenous 3 times per day 08/31/11 1527     08/27/11 2200   micafungin (MYCAMINE) 150 mg in sodium chloride 0.9 % 100 mL IVPB  Status:  Discontinued        150 mg 100 mL/hr over 1 Hours Intravenous Every 24 hours 08/27/11 2031 09/02/11 1300          Medications:  Scheduled:   . albuterol  2 puff Inhalation Q6H  . ALPRAZolam  0.25 mg Oral Once  . aspirin EC  81 mg Oral Daily  . calcium carbonate  1 tablet Oral TID WC  . docusate sodium  100 mg Oral BID  . enoxaparin (LOVENOX) injection  40 mg Subcutaneous Q24H  . feeding supplement  237 mL Oral BID BM  . fluconazole (DIFLUCAN) IV  400 mg Intravenous Q24H  . furosemide  60 mg Oral Daily  . metoprolol  50 mg Oral BID  . mulitivitamin with minerals  1 tablet Oral Daily  . pantoprazole  40 mg Oral Q1200  . piperacillin-tazobactam (ZOSYN)  IV  3.375 g  Intravenous Q8H  . sodium chloride  3 mL Intravenous Q12H  . traZODone  50 mg Oral QHS  . vancomycin  1,250 mg Intravenous Q12H  . vitamin C  250 mg Oral Daily  . DISCONTD: micafungin (MYCAMINE) IV  150 mg Intravenous Q24H  . DISCONTD: vancomycin  1,000 mg Intravenous Q8H    Objective: Vital signs in last 24 hours: Temp:  [98.3 F (36.8 C)-99.6 F (37.6 C)] 99.5 F (37.5 C) (06/04 0800) Pulse Rate:  [85-118] 105  (06/04 0800) Resp:  [16-34] 34  (06/04 0800) BP: (109-130)/(51-75) 128/62 mmHg (06/04 0800) SpO2:  [90 %-100 %] 100 % (06/04 0841) Weight:  [82.555 kg (182 lb)] 82.555 kg (182 lb) (06/04 0554)   General appearance: alert, cooperative and no distress Resp: clear to auscultation bilaterally Cardio: regular rate and rhythm GI: normal findings: bowel sounds normal and soft, non-tender Chest wall- incision clean, mild tenderness, swelling slightly decreased   Lab Results  Basename 09/03/11 0500 09/02/11 0437  WBC 9.9 9.8  HGB 7.6* 8.0*  HCT 24.5* 25.4*  NA 131* 129*  K 4.2 4.0  CL 93* 89*  CO2 30 33*  BUN 15 13  CREATININE 0.85 0.76  GLU -- --   Liver Panel  Basename 08/31/11 1631  PROT 7.8  ALBUMIN 2.0*  AST 36  ALT 21  ALKPHOS 155*  BILITOT 0.2*  BILIDIR --  IBILI --   Sedimentation Rate No results found for this basename: ESRSEDRATE in the last 72 hours C-Reactive Protein No results found for this basename: CRP:2 in the last 72 hours  Microbiology: Recent Results (from the past 240 hour(s))  MRSA PCR SCREENING     Status: Normal   Collection Time   08/27/11  5:41 PM      Component Value Range Status Comment   MRSA by PCR NEGATIVE  NEGATIVE  Final   CULTURE, BLOOD (ROUTINE X 2)     Status: Normal (Preliminary result)   Collection Time   08/27/11  9:53 PM      Component Value Range Status Comment   Specimen Description BLOOD LEFT FOOT   Final    Special Requests BOTTLES DRAWN AEROBIC AND ANAEROBIC 5CC   Final    Culture  Setup Time  [id number]   Final    Culture     Final    Value:        BLOOD CULTURE RECEIVED NO GROWTH TO DATE CULTURE WILL BE HELD FOR 5 DAYS BEFORE ISSUING A FINAL NEGATIVE REPORT   Report Status PENDING   Incomplete   CULTURE, BLOOD (ROUTINE X 2)     Status: Normal (Preliminary result)   Collection Time   08/27/11  9:59 PM      Component Value Range Status Comment   Specimen Description BLOOD RIGHT FOOT   Final    Special Requests BOTTLES DRAWN AEROBIC AND ANAEROBIC 5CC   Final    Culture  Setup Time [id number]   Final    Culture     Final    Value:        BLOOD CULTURE RECEIVED NO GROWTH TO DATE CULTURE WILL BE HELD FOR 5 DAYS BEFORE ISSUING A FINAL NEGATIVE REPORT   Report Status PENDING   Incomplete   BODY FLUID CULTURE     Status: Normal   Collection Time   08/28/11  3:54 PM      Component Value Range Status Comment   Specimen Description PLEURAL FLUID RIGHT   Final    Special Requests SYR   Final    Gram Stain     Final    Value: RARE WBC PRESENT,BOTH PMN AND MONONUCLEAR     NO ORGANISMS SEEN   Culture NO GROWTH 3 DAYS   Final    Report Status 09/01/2011 FINAL   Final   FUNGUS CULTURE W SMEAR     Status: Normal (Preliminary result)   Collection Time   08/28/11  3:55 PM      Component Value Range Status Comment   Specimen Description PLEURAL FLUID RIGHT   Final    Special Requests SYR   Final    Fungal Smear NO YEAST OR FUNGAL ELEMENTS SEEN   Final    Culture CULTURE IN PROGRESS FOR FOUR WEEKS   Final    Report Status PENDING   Incomplete   ANAEROBIC CULTURE     Status: Normal   Collection Time   08/28/11  3:55 PM      Component Value Range Status Comment   Specimen Description PLEURAL FLUID RIGHT   Final    Special Requests SYR   Final  Gram Stain     Final    Value: RARE WBC PRESENT,BOTH PMN AND MONONUCLEAR     NO ORGANISMS SEEN   Culture NO ANAEROBES ISOLATED   Final    Report Status 09/02/2011 FINAL   Final   AFB CULTURE WITH SMEAR     Status: Normal  (Preliminary result)   Collection Time   08/28/11  3:56 PM      Component Value Range Status Comment   Specimen Description PLEURAL FLUID RIGHT   Final    Special Requests SYR   Final    ACID FAST SMEAR NO ACID FAST BACILLI SEEN   Final    Culture     Final    Value: CULTURE WILL BE EXAMINED FOR 6 WEEKS BEFORE ISSUING A FINAL REPORT   Report Status PENDING   Incomplete   URINE CULTURE     Status: Normal   Collection Time   08/31/11  3:57 PM      Component Value Range Status Comment   Specimen Description URINE, RANDOM   Final    Special Requests NONE   Final    Culture  Setup Time [id number]   Final    Colony Count 85,000 COLONIES/ML   Final    Culture KLEBSIELLA PNEUMONIAE   Final    Report Status 09/03/2011 FINAL   Final    Organism ID, Bacteria KLEBSIELLA PNEUMONIAE   Final   CULTURE, BLOOD (ROUTINE X 2)     Status: Normal (Preliminary result)   Collection Time   08/31/11  4:07 PM      Component Value Range Status Comment   Specimen Description BLOOD RIGHT HAND   Final    Special Requests BOTTLES DRAWN AEROBIC AND ANAEROBIC 8CC   Final    Culture  Setup Time [id number]   Final    Culture     Final    Value:        BLOOD CULTURE RECEIVED NO GROWTH TO DATE CULTURE WILL BE HELD FOR 5 DAYS BEFORE ISSUING A FINAL NEGATIVE REPORT   Report Status PENDING   Incomplete   CULTURE, BLOOD (ROUTINE X 2)     Status: Normal (Preliminary result)   Collection Time   08/31/11  4:25 PM      Component Value Range Status Comment   Specimen Description BLOOD RIGHT FOOT   Final    Special Requests BOTTLES DRAWN AEROBIC AND ANAEROBIC 10CC   Final    Culture  Setup Time [id number]   Final    Culture     Final    Value:        BLOOD CULTURE RECEIVED NO GROWTH TO DATE CULTURE WILL BE HELD FOR 5 DAYS BEFORE ISSUING A FINAL NEGATIVE REPORT   Report Status PENDING   Incomplete     Studies/Results: Ct Chest W Contrast  09/01/2011  *RADIOLOGY REPORT*  Clinical Data: Follow-up progression of  necrotic area.  CT CHEST WITH CONTRAST  Technique:  Multidetector CT imaging of the chest was performed following the standard protocol during bolus administration of intravenous contrast.  Contrast:   80 ml Omnipaque 300 IV.  Comparison: Chest CT 08/18/2011  Findings: Again noted is the large central right pulmonary embolus with extension into the right lower lobe and right middle lobe pulmonary arteries, unchanged.  Embolus and occlusion of left upper lobe pulmonary arterial branches also again noted.  Extensive airspace disease throughout the right lower lobe.  Moderate right pleural effusion, possibly partially loculated.  Airspace disease in the right lower lobe has improved slightly since prior study. Patchy areas of airspace disease in the left lung, particularly the left lower lobe are unchanged.  Increasing wedge-shaped ground- glass opacity in the left upper lobe anteriorly, possibly infarct.  Small scattered mediastinal lymph nodes, none pathologically enlarged.  No hilar or axillary adenopathy.  Visualized thyroid and chest wall soft tissues unremarkable. Imaging into the upper abdomen shows no acute findings.  IMPRESSION: Large central right pulmonary embolus again noted, unchanged. Slight improvement in airspace disease within the right lower lobe. Right effusion, partially loculated, stable.  Left lung pulmonary emboli and ground-glass opacities again noted. New small wedge-shaped ground-glass opacity in the anterior left upper lobe, likely related to the emboli and pulmonary infarct.  Original Report Authenticated By: Cyndie Chime, M.D.     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  Appreciate CVTS  eval.  Need to watch his former pacer site very closely, low threshold to I & D. Seems to be smaller today, less firm. No erythema.  BCx 6-1 (-) so far  Making some progress Start to think about stopping vanco...  [name] Infectious Diseases [phone] 09/03/2011, 8:51 AM   LOS: 7 days

## 2011-09-03 NOTE — Progress Notes (Signed)
Monitor tech Britta Mccreedy)  informed nurse that the patient had 20  Beats of V tach. Nurse checked in on patient, and patient was asymptomatic. Harmon Pier

## 2011-09-03 NOTE — Progress Notes (Signed)
Patient anxious, with HR 103-105 and RR 24-32. Called and spoke with Dr. Milbert Coulter. New order received for Xanax 0.25mg  (one time dose). Will carry out order and continue to monitor patient.

## 2011-09-03 NOTE — Progress Notes (Signed)
Clinical Social Work-CSW notes change in d/c plan to reflect change in medication/needs- Signing off at this time- Available for re-consult PRN- Jodean Lima, [phone]

## 2011-09-03 NOTE — Progress Notes (Signed)
*  Preliminary Results* Bilateral lower extremity venous duplex completed. Right lower extremity is positive for deep vein thrombosis in the distal femoral vein. No evidence of left lower extremity deep vein thrombosis.  09/03/2011 4:38 PM  Elpidio Galea, RDMS,RDCS

## 2011-09-04 ENCOUNTER — Inpatient Hospital Stay (HOSPITAL_COMMUNITY): Payer: Medicare Other

## 2011-09-04 ENCOUNTER — Encounter (HOSPITAL_COMMUNITY)
Admission: AD | Disposition: A | Discharge status: Home / Self Care | Payer: Self-pay | Source: Ambulatory Visit | Attending: Internal Medicine

## 2011-09-04 ENCOUNTER — Inpatient Hospital Stay: Payer: Medicare Other | Admitting: Internal Medicine

## 2011-09-04 DIAGNOSIS — I2690 Septic pulmonary embolism without acute cor pulmonale: Secondary | ICD-10-CM

## 2011-09-04 LAB — CBC
MCH: 24.8 pg — ABNORMAL LOW (ref 26.0–34.0)
MCV: 79.9 fL (ref 78.0–100.0)
Platelets: 361 10*3/uL (ref 150–400)
RBC: 3.23 MIL/uL — ABNORMAL LOW (ref 4.22–5.81)
RDW: 15.7 % — ABNORMAL HIGH (ref 11.5–15.5)
WBC: 10.5 10*3/uL (ref 4.0–10.5)

## 2011-09-04 LAB — BASIC METABOLIC PANEL
CO2: 29 mEq/L (ref 19–32)
CO2: 30 mEq/L (ref 19–32)
Calcium: 9.3 mg/dL (ref 8.4–10.5)
Calcium: 9.1 mg/dL (ref 8.4–10.5)
Creatinine, Ser: 0.87 mg/dL (ref 0.50–1.35)
Creatinine, Ser: 0.85 mg/dL (ref 0.50–1.35)
GFR calc Af Amer: >90 mL/min (ref >90)
GFR calc Af Amer: >90 mL/min (ref >90)
GFR calc non Af Amer: 89 mL/min — ABNORMAL LOW (ref >90)
Sodium: 130 mEq/L — ABNORMAL LOW (ref 135–145)
Sodium: 128 mEq/L — ABNORMAL LOW (ref 135–145)

## 2011-09-04 LAB — MRSA PCR SCREENING: MRSA by PCR: NEGATIVE

## 2011-09-04 LAB — PROTIME-INR
INR: 1.18 (ref 0.00–1.49)
Prothrombin Time: 15.3 seconds — ABNORMAL HIGH (ref 11.6–15.2)

## 2011-09-04 LAB — CARDIAC PANEL(CRET KIN+CKTOT+MB+TROPI)
Relative Index: INVALID (ref 0.0–2.5)
Total CK: 12 U/L (ref 7–232)

## 2011-09-04 LAB — MAGNESIUM: Magnesium: 2.2 mg/dL (ref 1.5–2.5)

## 2011-09-04 SURGERY — ECHOCARDIOGRAM, TRANSESOPHAGEAL
Anesthesia: Moderate Sedation

## 2011-09-04 MED ORDER — ENOXAPARIN SODIUM 40 MG/0.4ML ~~LOC~~ SOLN
40.0000 mg | SUBCUTANEOUS | Status: DC
  Administered 2011-09-04: 40 mg via SUBCUTANEOUS
  Filled 2011-09-04 (×2): qty 0.4

## 2011-09-04 MED ORDER — CIPROFLOXACIN HCL 500 MG PO TABS
500.0000 mg | ORAL_TABLET | Freq: Two times a day (BID) | ORAL | Status: DC
  Administered 2011-09-04 – 2011-09-07 (×6): 500 mg via ORAL
  Filled 2011-09-04 (×10): qty 1

## 2011-09-04 NOTE — Progress Notes (Signed)
PT Cancellation Note  Treatment cancelled today due to medical issues with patient which prohibited therapy.  Patient with new diagnosis of DVT.  MD deciding upon IVC filter vs. Anti-coagulation meds. Will check back tomorrow.  Thanks.  INGOLD,Michol Emory 09/04/2011, 1:32 PM  [facility] [phone] (pager)

## 2011-09-04 NOTE — Progress Notes (Addendum)
S: nurse reports that patient had 47 beats of V. tach at 4:30pm.  Blood pressure was stable with SBP 100's     When evaluating patient, he states that he felt really bad today.  And he did feel" funny" and palpitation when V. tach happened.     Monitor Strips reviewed.  O: general: Chronic ill feeling      Lungs: Bilateral lungs sounds diminished, R>L. A few rales noted on right sided lung. No wheezing or rhonchi noted.       Heart: RRR. Left 3rd -4th sternal boarder 2-3/6 systolic murmur noted with radiation to left 2nd intercostal space.                   No murmur noted on right 2nd intercostal space or Apex. No R/G.       Abd:   soft, BS x 4. nontender       Ext: no edema  A/P - patient had symptomatic nonsustained 47 beats VT for 10 secs in the setting of ischemic cardiomyopathy with EF 30%. His ICD lead was removed due to infection on this admission.  -Nonsustained/sustained VT will likely increased the risk of sudden cardiac death in a patient with ischemic cardiomyopathy, especially given his complicated hospital course and co mobilities.   Plan: - stat EKG>>no obvious ischemic changes.  - CE x 3 - stat BMP and Mg>> - transfer him to 2900 now - defibrillator pads applied on patient - patient will need defibrillation if he has sustained VT (> 30 secs) - I have discussed above plan with Dr. Dorthula Rue and Dr. Graciela Husbands [facility] cardiology).

## 2011-09-04 NOTE — Progress Notes (Signed)
Medical Student Daily Progress Note  Subjective: Patient does not feel too great this morning. He slept until about 4 am and then could not fall back asleep. He is not in pain but he is lethargic and complains of a wet cough productive of clear sputum at times, however he usually swallows it back down most of the time. He has been NPO since midnight and is beginning to get hungry, however current agenda (IVC filter) for today is still pending and will be resolved asap. He has been having regular BM's every other day, urinating well with no problems and denies any chills, night sweats, rigors. He is resting in bed with his wife bedside.  Objective: Vital signs in last 24 hours: Filed Vitals:   09/03/11 2112 09/03/11 2114 09/04/11 0426 09/04/11 0900  BP:  119/74 131/67   Pulse:  99 99   Temp:   98.9 F (37.2 C)   TempSrc:   Oral   Resp:   16   Height:      Weight:      SpO2: 96%  94% 95%   Weight change:   Intake/Output Summary (Last 24 hours) at 09/04/11 1030 Last data filed at 09/04/11 0530  Gross per 24 hour  Intake    753 ml  Output   1375 ml  Net   -622 ml   Physical Exam: BP 131/67  Pulse 99  Temp(Src) 98.9 F (37.2 C) (Oral)  Resp 16  Ht 5\' 10"  (1.778 m)  Wt 82.555 kg (182 lb)  BMI 26.11 kg/m2  SpO2 95% General appearance: cooperative, fatigued and no distress Neck: no adenopathy, no carotid bruit, no JVD, supple, symmetrical, trachea midline and thyroid not enlarged, symmetric, no tenderness/mass/nodules Back: symmetric, no curvature. ROM normal. No CVA tenderness. Lungs: diminished breath sounds RLL and coarse breath sounds RML Chest wall: no tenderness Heart: regular rate and rhythm, S1, S2 normal, no murmur, click, rub or gallop Abdomen: soft, non-tender; bowel sounds normal; no masses,  no organomegaly Extremities: extremities normal, atraumatic, no cyanosis or edema Skin: Skin color, texture, turgor normal. No rashes or lesions Lab Results: Basic Metabolic  Panel:  Lab 09/04/11 0500 09/03/11 0500  NA 130* 131*  K 4.3 4.2  CL 93* 93*  CO2 29 30  GLUCOSE 111* 106*  BUN 15 15  CREATININE 0.87 0.85  CALCIUM 9.3 8.9  MG -- --  PHOS -- --   Liver Function Tests:  Lab 08/31/11 1631  AST 36  ALT 21  ALKPHOS 155*  BILITOT 0.2*  PROT 7.8  ALBUMIN 2.0*    Lab 08/29/11 1105  LIPASE --  AMYLASE 49   No results found for this basename: AMMONIA:2 in the last 168 hours CBC:  Lab 09/04/11 0500 09/03/11 0500 09/02/11 0437 08/31/11 1631  WBC 10.5 9.9 -- --  NEUTROABS -- -- 7.3 6.5  HGB 8.0* 7.6* -- --  HCT 25.8* 24.5* -- --  MCV 79.9 79.3 -- --  PLT 361 315 -- --   Cardiac Enzymes:  Lab 09/01/11 0500 08/30/11 0434 08/29/11 2014  CKTOTAL <7* 9 9  CKMB 1.1 1.3 1.2  CKMBINDEX -- -- --  TROPONINI <0.30 <0.30 <0.30   BNP:  Lab 09/01/11 0500  PROBNP 973.1*   D-Dimer: No results found for this basename: DDIMER:2 in the last 168 hours CBG: No results found for this basename: GLUCAP:6 in the last 168 hours Hemoglobin A1C: No results found for this basename: HGBA1C in the last 168 hours Fasting Lipid  Panel: No results found for this basename: CHOL,HDL,LDLCALC,TRIG,CHOLHDL,LDLDIRECT in the last 409 hours Thyroid Function Tests: No results found for this basename: TSH,T4TOTAL,FREET4,T3FREE,THYROIDAB in the last 168 hours Coagulation:  Lab 09/04/11 0500  LABPROT 15.3*  INR 1.18   Anemia Panel: No results found for this basename: VITAMINB12,FOLATE,FERRITIN,TIBC,IRON,RETICCTPCT in the last 168 hours Urine Drug Screen: Drugs of Abuse  No results found for this basename: labopia, cocainscrnur, labbenz, amphetmu, thcu, labbarb    Alcohol Level: No results found for this basename: ETH:2 in the last 168 hours Urinalysis:  Lab 08/31/11 1557  COLORURINE YELLOW  LABSPEC 1.020  PHURINE 7.0  GLUCOSEU NEGATIVE  HGBUR NEGATIVE  BILIRUBINUR NEGATIVE  KETONESUR NEGATIVE  PROTEINUR NEGATIVE  UROBILINOGEN 1.0  NITRITE NEGATIVE    LEUKOCYTESUR NEGATIVE    Micro Results: Recent Results (from the past 240 hour(s))  MRSA PCR SCREENING     Status: Normal   Collection Time   08/27/11  5:41 PM      Component Value Range Status Comment   MRSA by PCR NEGATIVE  NEGATIVE  Final   CULTURE, BLOOD (ROUTINE X 2)     Status: Normal   Collection Time   08/27/11  9:53 PM      Component Value Range Status Comment   Specimen Description BLOOD LEFT FOOT   Final    Special Requests BOTTLES DRAWN AEROBIC AND ANAEROBIC 5CC   Final    Culture  Setup Time [id number]   Final    Culture NO GROWTH 5 DAYS   Final    Report Status 09/03/2011 FINAL   Final   CULTURE, BLOOD (ROUTINE X 2)     Status: Normal   Collection Time   08/27/11  9:59 PM      Component Value Range Status Comment   Specimen Description BLOOD RIGHT FOOT   Final    Special Requests BOTTLES DRAWN AEROBIC AND ANAEROBIC 5CC   Final    Culture  Setup Time [id number]   Final    Culture NO GROWTH 5 DAYS   Final    Report Status 09/03/2011 FINAL   Final   BODY FLUID CULTURE     Status: Normal   Collection Time   08/28/11  3:54 PM      Component Value Range Status Comment   Specimen Description PLEURAL FLUID RIGHT   Final    Special Requests SYR   Final    Gram Stain     Final    Value: RARE WBC PRESENT,BOTH PMN AND MONONUCLEAR     NO ORGANISMS SEEN   Culture NO GROWTH 3 DAYS   Final    Report Status 09/01/2011 FINAL   Final   FUNGUS CULTURE W SMEAR     Status: Normal (Preliminary result)   Collection Time   08/28/11  3:55 PM      Component Value Range Status Comment   Specimen Description PLEURAL FLUID RIGHT   Final    Special Requests SYR   Final    Fungal Smear NO YEAST OR FUNGAL ELEMENTS SEEN   Final    Culture CULTURE IN PROGRESS FOR FOUR WEEKS   Final    Report Status PENDING   Incomplete   ANAEROBIC CULTURE     Status: Normal   Collection Time   08/28/11  3:55 PM      Component Value Range Status Comment   Specimen Description PLEURAL FLUID  RIGHT   Final    Special Requests SYR   Final  Gram Stain     Final    Value: RARE WBC PRESENT,BOTH PMN AND MONONUCLEAR     NO ORGANISMS SEEN   Culture NO ANAEROBES ISOLATED   Final    Report Status 09/02/2011 FINAL   Final   AFB CULTURE WITH SMEAR     Status: Normal (Preliminary result)   Collection Time   08/28/11  3:56 PM      Component Value Range Status Comment   Specimen Description PLEURAL FLUID RIGHT   Final    Special Requests SYR   Final    ACID FAST SMEAR NO ACID FAST BACILLI SEEN   Final    Culture     Final    Value: CULTURE WILL BE EXAMINED FOR 6 WEEKS BEFORE ISSUING A FINAL REPORT   Report Status PENDING   Incomplete   URINE CULTURE     Status: Normal   Collection Time   08/31/11  3:57 PM      Component Value Range Status Comment   Specimen Description URINE, RANDOM   Final    Special Requests NONE   Final    Culture  Setup Time [id number]   Final    Colony Count 85,000 COLONIES/ML   Final    Culture KLEBSIELLA PNEUMONIAE   Final    Report Status 09/03/2011 FINAL   Final    Organism ID, Bacteria KLEBSIELLA PNEUMONIAE   Final   CULTURE, BLOOD (ROUTINE X 2)     Status: Normal (Preliminary result)   Collection Time   08/31/11  4:07 PM      Component Value Range Status Comment   Specimen Description BLOOD RIGHT HAND   Final    Special Requests BOTTLES DRAWN AEROBIC AND ANAEROBIC 8CC   Final    Culture  Setup Time [id number]   Final    Culture     Final    Value:        BLOOD CULTURE RECEIVED NO GROWTH TO DATE CULTURE WILL BE HELD FOR 5 DAYS BEFORE ISSUING A FINAL NEGATIVE REPORT   Report Status PENDING   Incomplete   CULTURE, BLOOD (ROUTINE X 2)     Status: Normal (Preliminary result)   Collection Time   08/31/11  4:25 PM      Component Value Range Status Comment   Specimen Description BLOOD RIGHT FOOT   Final    Special Requests BOTTLES DRAWN AEROBIC AND ANAEROBIC 10CC   Final    Culture  Setup Time [id number]   Final    Culture     Final    Value:         BLOOD CULTURE RECEIVED NO GROWTH TO DATE CULTURE WILL BE HELD FOR 5 DAYS BEFORE ISSUING A FINAL NEGATIVE REPORT   Report Status PENDING   Incomplete    Studies/Results: No results found. Medications:  I have reviewed the patient's current medications. Anti-infectives     Start     Dose/Rate Route Frequency Ordered Stop   09/02/11 1400   fluconazole (DIFLUCAN) IVPB 400 mg        400 mg 200 mL/hr over 60 Minutes Intravenous Every 24 hours 09/02/11 1300     09/02/11 1200   vancomycin (VANCOCIN) 1,250 mg in sodium chloride 0.9 % 250 mL IVPB  Status:  Discontinued        1,250 mg 166.7 mL/hr over 90 Minutes Intravenous Every 12 hours 09/02/11 1001 09/03/11 0947   08/31/11 1600   vancomycin (VANCOCIN) IVPB 1000 mg/200 mL  premix  Status:  Discontinued        1,000 mg 200 mL/hr over 60 Minutes Intravenous Every 8 hours 08/31/11 1527 09/02/11 1001   08/31/11 1600  piperacillin-tazobactam (ZOSYN) IVPB 3.375 g       3.375 g 12.5 mL/hr over 240 Minutes Intravenous 3 times per day 08/31/11 1527     08/27/11 2200   micafungin (MYCAMINE) 150 mg in sodium chloride 0.9 % 100 mL IVPB  Status:  Discontinued        150 mg 100 mL/hr over 1 Hours Intravenous Every 24 hours 08/27/11 2031 09/02/11 1300         Scheduled Meds:   . albuterol  2 puff Inhalation Q6H  . aspirin EC  81 mg Oral Daily  . calcium carbonate  1 tablet Oral TID WC  . docusate sodium  100 mg Oral BID  . enoxaparin (LOVENOX) injection  40 mg Subcutaneous Q24H  . feeding supplement  237 mL Oral BID BM  . fluconazole (DIFLUCAN) IV  400 mg Intravenous Q24H  . furosemide  60 mg Oral Daily  . metoprolol  50 mg Oral BID  . mulitivitamin with minerals  1 tablet Oral Daily  . oxyCODONE-acetaminophen  1 tablet Oral Once  . pantoprazole  40 mg Oral Q1200  . piperacillin-tazobactam (ZOSYN)  IV  3.375 g Intravenous Q8H  . sodium chloride  3 mL Intravenous Q12H  . traZODone  50 mg Oral QHS  . vitamin C  250 mg Oral Daily  .  DISCONTD: enoxaparin (LOVENOX) injection  40 mg Subcutaneous Q24H   Continuous Infusions:   . sodium chloride 10 mL/hr (09/03/11 1000)   PRN Meds:.acetaminophen, clonazePAM, ipratropium, levalbuterol, oxyCODONE-acetaminophen, polyethylene glycol, sodium chloride, DISCONTD:  morphine injection, DISCONTD: oxyCODONE-acetaminophen Assessment/Plan:  Pleural effusion / pulmonary embolism / pulmonary infarction - Patient is s/p thoracentesis (08/28/11) for R effusion. Most recent CT chest (09/01/11) shows unchanged central right PA Candidal embolus (extension to RML, RLL), and embolus/occlusion of LUL pulmonary artery branches. Moderate right pleural effusion present (possibly loculated). Wedge shaped infarcts noted in RML and LUL.  - continue percocet 5/325 q6prn and tylenol 325-650mg  prn for pain  - continue albuterol 2 puffs QID for added bronchodilation  - continue atrovent q4prn / xopenex q6prn nebs  - continue lasix 60 mg daily  - Pleural fluid AFB cx, fungal stain and cx, anerobic culture: NTD   Acute on chronic systolic CHF 2/2 CAD and ischemic cardiomyopathy - 2D echo (08/23/11) shows EF = 30% with diffuse hypokinesis, mild LVH, and moderately dilated LA. 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  - continue ASA 81 mg daily  - continue metoprolol 50 BID  - continue lasix 60 daily--foley removed on 09/02/11  - TEE cancelled for now-- discussed with Dr. Elease Hashimoto and Dr. Ninetta Lights; evidence of endocarditis vs. new organic heart disease very low at this point - will obtain an MRI for valve visualization   Questionable HAP - Patient spiked fever and had relative leukocytosis on 08/31/11 with RLL consolidation seen on CXR as well. Concerning for pneumonia and/or small effusion. - d/c vanc (08/31/11>>09/03/11)  - blood cultures x 2 : NTD   Bacteriuria - Patient's urine culture revealed Klebsiella pneumoniae. 85,000 col/mL.  - continue zosyn (08/31/11>>)  Fever - Patient had  Candidal embolus (from atrial lead on former ICD) to R pulmonary artery on 08/15/11. Fevers likely due to gradual pieces of the mass breaking off, with appropriate immune  response causing him to have fevers every couple of days. Question of his old pacer pocket having an abscess/infection low possibility at this point but has been considered (no warmth, erythema, tenderness, disproportionate swelling).  - Appreciate ID recommendations and consult in the management of this patient  - blood cultures x 2 pending: NTD  - change micafungin (08/15/11>>09/01/11) to IV fluconazole 400 mg daily to finish 6 week course (08/15/11>>)  - Candida sensitivities are back from reference lab: sensitive to both micafungin and fluconazole   Distal Femoral Vein thrombosis - Patient had LE doppler on 09/03/11 that showed DVT in right femoral vein distally. He has been on Lovenox since 08/29/11 and was put on SCD's on admission 08/27/11 prior to beginning Lovenox. Although LE doppler revealed DVT, question of how long patient may have had the clot remains. He was discharged 5/24 and readmitted 5/28 with minimal activity while home. While inpatient last admit and this admission he has received Lovenox for DVT ppx. - continue Lovenox for DVT ppx for now - may need IVC filter, will consult with multi-disciplinary teams - Coumadin contraindicated 2/2 poor outcome in limited sudies  GERD / Esophageal Candidiasis - Stable. He does note some minor discomfort with swallowing, however he also states that the dilation on 08/13/11 was not a complete dilation that he is used to.  - continue fluconazole  - continue Protonix 40mg  daily   Tachycardia - Patient continues to have episodes of tachycardia (90's -110's). SOB, Stress response, and anxiety contributing components of this. His pain management (left sided and due to his infarcted lung) is well under control with his percocet, however he sometimes waits until the pain is unbearable before  asking for medication. He has been informed to try asking for pain management when the pain first begins. - metoprolol 50mg  BID  - continue percocet  Anxiety - Patient remains anxious and sleep deprived. His anxiety medicine does work when he takes it.  - continue clonazepam 0.5 mg BID PRN  Insomnia - Patient reports not sleeping well at night. He does note that his PRN percocet does make him sleepy so I advised him to try taking his dose later in the evening to help him with sleep.  - patient will ask for percocet before bedtime  - continue trazodone 50 mg qhs   Normocytic anemia - Hgb 8 today. MCV 79.9. This is slightly up from yesterday. 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 Iron = 21, Sat ratio = 9, TIBC =243, Ferritin = 597, Retic count =1.9, FOBT negative. Smear revealed mild anemia with normal morphology. Side effect of suppression of RBC production from micafungin should be resolving Hgb (3-10%).  - FOBT negative (09/03/11) - continue to check CBC  - micafungin has been discontinued x 2 days now  Hyponatremia - Na = 130 today. Likely 2/2 his CHF and removal of his biV ICD.  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 His IgM and IgA were increased during his last admission, which could likely be due to his acute illnesses. Hematology consult during previous admission reccommended follow-up after his current infection is completely resolved.  - repeat SPEP/UPEP after full resolution of Candidal infection  Disposition - TEE cancelled for today. Given discovery of his DVT will hold off on SCDs and Coumadin.   LOS: 8 days   This is a Psychologist, occupational Note.  The care of the patient was discussed with Dr. Margorie John and  the assessment and plan formulated with their assistance.  Please see their attached note for official documentation of the daily encounter.  Lewie Chamber 09/04/2011, 10:30 AM   Resident Addendum to Medical Student Note   I have  seen and examined the patient, and agree with the the medical student assessment and plan outlined above. Please see my brief note below for additional details.  HPI: Patient was feeling fairly short of breath and was anxious and pale appearing when I saw him.   OBJECTIVE: VS: Reviewed  Meds: Reviewed  Labs: Reviewed  Imaging: Reviewed   Physical Exam: General: Vital signs reviewed and noted. Elderly male with shallow breathing.  Lungs:  Increased respiratory effort. Dry crackles present on right middle lung field.  Heart: Tachycardic. S1 and S2 normal without gallop, murmur, or rubs.  Abdomen:  BS normoactive. Soft, Nondistended, non-tender.    Extremities: No pretibial edema.     ASSESSMENT/ PLAN: Concern for candidal valvular lesion- Spoke to Dr. Elease Hashimoto and Dr. Ninetta Lights of infectious disease about possibility of repeat TEE. Dr. Elease Hashimoto recommended cardiac MRI instead due to patient's significant comorbidities and difficulty of previous TEE due to esophageal stricture.  DVT- spoke to Dr. Ninetta Lights and Dr. 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 IVC filter in patient with poor cardiopulmonary reserve. Will get MRA head and rule out mycotic emboli/aneurysm before proceeding with judicious anticoagulation with lovenox.  Dyspnea- patient's poor systolic function and removal of ICD may be contributing to his increasing dyspnea. Will consider chest X-ray and extra lasix dose for dyspnea overnight.  Length of Stay: 8   Margorie John, M.D. [facility], Internal Medicine Resident 09/04/2011, 5:56 PM

## 2011-09-04 NOTE — Progress Notes (Signed)
INFECTIOUS DISEASE PROGRESS NOTE  ID: [name] is a 66 y.o. male with  Principal Problem:  *Candidemia Active Problems:  Chronic systolic congestive heart failure  Normocytic anemia  Esophageal stricture  Thrombocytopenia  Septic embolism  Candidal endocarditis  Fever  Pleural effusion  Malnutrition  Subjective: Feels poorly, c/o headache.  Abtx:  Anti-infectives     Start     Dose/Rate Route Frequency Ordered Stop   09/02/11 1400   fluconazole (DIFLUCAN) IVPB 400 mg        400 mg 200 mL/hr over 60 Minutes Intravenous Every 24 hours 09/02/11 1300     09/02/11 1200   vancomycin (VANCOCIN) 1,250 mg in sodium chloride 0.9 % 250 mL IVPB  Status:  Discontinued        1,250 mg 166.7 mL/hr over 90 Minutes Intravenous Every 12 hours 09/02/11 1001 09/03/11 0947   08/31/11 1600   vancomycin (VANCOCIN) IVPB 1000 mg/200 mL premix  Status:  Discontinued        1,000 mg 200 mL/hr over 60 Minutes Intravenous Every 8 hours 08/31/11 1527 09/02/11 1001   08/31/11 1600  piperacillin-tazobactam (ZOSYN) IVPB 3.375 g       3.375 g 12.5 mL/hr over 240 Minutes Intravenous 3 times per day 08/31/11 1527     08/27/11 2200   micafungin (MYCAMINE) 150 mg in sodium chloride 0.9 % 100 mL IVPB  Status:  Discontinued        150 mg 100 mL/hr over 1 Hours Intravenous Every 24 hours 08/27/11 2031 09/02/11 1300          Medications:  Scheduled:   . albuterol  2 puff Inhalation Q6H  . aspirin EC  81 mg Oral Daily  . calcium carbonate  1 tablet Oral TID WC  . docusate sodium  100 mg Oral BID  . feeding supplement  237 mL Oral BID BM  . fluconazole (DIFLUCAN) IV  400 mg Intravenous Q24H  . furosemide  60 mg Oral Daily  . metoprolol  50 mg Oral BID  . mulitivitamin with minerals  1 tablet Oral Daily  . oxyCODONE-acetaminophen  1 tablet Oral Once  . pantoprazole  40 mg Oral Q1200  . piperacillin-tazobactam (ZOSYN)  IV  3.375 g Intravenous Q8H  . sodium chloride  3 mL Intravenous Q12H    . traZODone  50 mg Oral QHS  . vitamin C  250 mg Oral Daily  . DISCONTD: enoxaparin (LOVENOX) injection  40 mg Subcutaneous Q24H  . DISCONTD: vancomycin  1,250 mg Intravenous Q12H    Objective: Vital signs in last 24 hours: Temp:  [98.9 F (37.2 C)-99.6 F (37.6 C)] 98.9 F (37.2 C) (06/05 0426) Pulse Rate:  [82-118] 99  (06/05 0426) Resp:  [14-30] 16  (06/05 0426) BP: (109-131)/(63-74) 131/67 mmHg (06/05 0426) SpO2:  [94 %-97 %] 94 % (06/05 0426)   General appearance: alert, cooperative and mild distress Resp: clear to auscultation bilaterally Cardio: regular rate and rhythm GI: normal findings: bowel sounds normal and soft, non-tender  Lab Results  Basename 09/04/11 0500 09/03/11 0500  WBC 10.5 9.9  HGB 8.0* 7.6*  HCT 25.8* 24.5*  NA 130* 131*  K 4.3 4.2  CL 93* 93*  CO2 29 30  BUN 15 15  CREATININE 0.87 0.85  GLU -- --   Liver Panel No results found for this basename: PROT:2,ALBUMIN:2,AST:2,ALT:2,ALKPHOS:2,BILITOT:2,BILIDIR:2,IBILI:2 in the last 72 hours Sedimentation Rate No results found for this basename: ESRSEDRATE in the last 72 hours C-Reactive Protein No  results found for this basename: CRP:2 in the last 72 hours  Microbiology: Recent Results (from the past 240 hour(s))  MRSA PCR SCREENING     Status: Normal   Collection Time   08/27/11  5:41 PM      Component Value Range Status Comment   MRSA by PCR NEGATIVE  NEGATIVE  Final   CULTURE, BLOOD (ROUTINE X 2)     Status: Normal   Collection Time   08/27/11  9:53 PM      Component Value Range Status Comment   Specimen Description BLOOD LEFT FOOT   Final    Special Requests BOTTLES DRAWN AEROBIC AND ANAEROBIC 5CC   Final    Culture  Setup Time [id number]   Final    Culture NO GROWTH 5 DAYS   Final    Report Status 09/03/2011 FINAL   Final   CULTURE, BLOOD (ROUTINE X 2)     Status: Normal   Collection Time   08/27/11  9:59 PM      Component Value Range Status Comment   Specimen Description BLOOD  RIGHT FOOT   Final    Special Requests BOTTLES DRAWN AEROBIC AND ANAEROBIC 5CC   Final    Culture  Setup Time [id number]   Final    Culture NO GROWTH 5 DAYS   Final    Report Status 09/03/2011 FINAL   Final   BODY FLUID CULTURE     Status: Normal   Collection Time   08/28/11  3:54 PM      Component Value Range Status Comment   Specimen Description PLEURAL FLUID RIGHT   Final    Special Requests SYR   Final    Gram Stain     Final    Value: RARE WBC PRESENT,BOTH PMN AND MONONUCLEAR     NO ORGANISMS SEEN   Culture NO GROWTH 3 DAYS   Final    Report Status 09/01/2011 FINAL   Final   FUNGUS CULTURE W SMEAR     Status: Normal (Preliminary result)   Collection Time   08/28/11  3:55 PM      Component Value Range Status Comment   Specimen Description PLEURAL FLUID RIGHT   Final    Special Requests SYR   Final    Fungal Smear NO YEAST OR FUNGAL ELEMENTS SEEN   Final    Culture CULTURE IN PROGRESS FOR FOUR WEEKS   Final    Report Status PENDING   Incomplete   ANAEROBIC CULTURE     Status: Normal   Collection Time   08/28/11  3:55 PM      Component Value Range Status Comment   Specimen Description PLEURAL FLUID RIGHT   Final    Special Requests SYR   Final    Gram Stain     Final    Value: RARE WBC PRESENT,BOTH PMN AND MONONUCLEAR     NO ORGANISMS SEEN   Culture NO ANAEROBES ISOLATED   Final    Report Status 09/02/2011 FINAL   Final   AFB CULTURE WITH SMEAR     Status: Normal (Preliminary result)   Collection Time   08/28/11  3:56 PM      Component Value Range Status Comment   Specimen Description PLEURAL FLUID RIGHT   Final    Special Requests SYR   Final    ACID FAST SMEAR NO ACID FAST BACILLI SEEN   Final    Culture     Final  Value: CULTURE WILL BE EXAMINED FOR 6 WEEKS BEFORE ISSUING A FINAL REPORT   Report Status PENDING   Incomplete   URINE CULTURE     Status: Normal   Collection Time   08/31/11  3:57 PM      Component Value Range Status Comment    Specimen Description URINE, RANDOM   Final    Special Requests NONE   Final    Culture  Setup Time [id number]   Final    Colony Count 85,000 COLONIES/ML   Final    Culture KLEBSIELLA PNEUMONIAE   Final    Report Status 09/03/2011 FINAL   Final    Organism ID, Bacteria KLEBSIELLA PNEUMONIAE   Final   CULTURE, BLOOD (ROUTINE X 2)     Status: Normal (Preliminary result)   Collection Time   08/31/11  4:07 PM      Component Value Range Status Comment   Specimen Description BLOOD RIGHT HAND   Final    Special Requests BOTTLES DRAWN AEROBIC AND ANAEROBIC 8CC   Final    Culture  Setup Time [id number]   Final    Culture     Final    Value:        BLOOD CULTURE RECEIVED NO GROWTH TO DATE CULTURE WILL BE HELD FOR 5 DAYS BEFORE ISSUING A FINAL NEGATIVE REPORT   Report Status PENDING   Incomplete   CULTURE, BLOOD (ROUTINE X 2)     Status: Normal (Preliminary result)   Collection Time   08/31/11  4:25 PM      Component Value Range Status Comment   Specimen Description BLOOD RIGHT FOOT   Final    Special Requests BOTTLES DRAWN AEROBIC AND ANAEROBIC 10CC   Final    Culture  Setup Time [id number]   Final    Culture     Final    Value:        BLOOD CULTURE RECEIVED NO GROWTH TO DATE CULTURE WILL BE HELD FOR 5 DAYS BEFORE ISSUING A FINAL NEGATIVE REPORT   Report Status PENDING   Incomplete     Studies/Results: No results found.   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  Need to watch his former pacer site very closely, low threshold to I & D. Seems to be smaller today, less firm. No erythema.  BCx 6-1 (-) so far  Difficulty is that he now has DVT and needs anti-caogualtion. Historically  patients have done poorly when anti-coagulated with endocarditis. Is he far enough out now that he could have anti-coagulation? Not clear...Marland KitchenMarland KitchenMarland Kitchen Awaiting repeat TEE  Johny Sax Infectious Diseases [phone] 09/04/2011, 8:45 AM   LOS: 8 days

## 2011-09-04 NOTE — Progress Notes (Signed)
Internal Medicine Attending  Date: 09/04/2011  Patient name: [name] Medical record number: [id number] Date of birth: 05-15-1945 Age: 66 y.o. Gender: male  We have discussed the best management of patient's DVT with cardiothoracic surgery, who favored anticoagulation over an IVC filter; we also discussed at length with Dr. Ninetta Lights, and the consensus is that if a stat MRI/MRA of the brain shows no evidence of septic embolism or mycotic aneurysm, then anticoagulation would be the best option.  We discussed the situation with Dr. Benard Rink in radiology, who will do a stat MRI/MRA of the brain, and if this is negative then the plan will be to carefully anticoagulate without a heparin bolus.  If anti-coagulation is contraindicated by the MRI results, then an IVC filter would be the only option to protect from pulmonary embolism.

## 2011-09-04 NOTE — Progress Notes (Signed)
Internal Medicine Attending  Date: 09/04/2011  Patient name: [name] Medical record number: [id number] Date of birth: 1945-05-16 Age: 66 y.o. Gender: male  I saw and evaluated the patient, and discussed his care with house staff.  He reports some cough this morning, otherwise respiratory status is stable. He has no leg pain.  Exam is notable for basilar crackles, right greater than left; regular rhythm; soft nontender abdomen; no lower extremity edema.  Lower extremity venous duplex study showed deep vein thrombosis in the right distal femoral vein.  Options for treating the right lower extremity DVT include anti-coagulation or an IVC filter. Given his candidal endocarditis with septic pulmonary emboli, anticoagulation may carry increased risk.  Would discuss ASAP with cardiothoracic surgery which option they would recommend.

## 2011-09-05 ENCOUNTER — Encounter (HOSPITAL_COMMUNITY): Payer: Self-pay | Admitting: Physician Assistant

## 2011-09-05 DIAGNOSIS — I2510 Atherosclerotic heart disease of native coronary artery without angina pectoris: Secondary | ICD-10-CM

## 2011-09-05 DIAGNOSIS — I472 Ventricular tachycardia, unspecified: Secondary | ICD-10-CM | POA: Diagnosis not present

## 2011-09-05 DIAGNOSIS — I501 Left ventricular failure: Secondary | ICD-10-CM

## 2011-09-05 LAB — CBC
HCT: 25.3 % — ABNORMAL LOW (ref 39.0–52.0)
HCT: 24.5 % — ABNORMAL LOW (ref 39.0–52.0)
Hemoglobin: 7.6 g/dL — ABNORMAL LOW (ref 13.0–17.0)
MCHC: 30.4 g/dL (ref 30.0–36.0)
MCV: 79.8 fL (ref 78.0–100.0)
MCV: 79.8 fL (ref 78.0–100.0)
Platelets: 424 10*3/uL — ABNORMAL HIGH (ref 150–400)
RDW: 15.8 % — ABNORMAL HIGH (ref 11.5–15.5)
WBC: 11.4 10*3/uL — ABNORMAL HIGH (ref 4.0–10.5)
WBC: 11 10*3/uL — ABNORMAL HIGH (ref 4.0–10.5)

## 2011-09-05 LAB — CARDIAC PANEL(CRET KIN+CKTOT+MB+TROPI)
CK, MB: 1.2 ng/mL (ref 0.3–4.0)
CK, MB: 1 ng/mL (ref 0.3–4.0)
Relative Index: INVALID (ref 0.0–2.5)
Troponin I: <0.3 ng/mL (ref <0.30)
Troponin I: <0.3 ng/mL (ref <0.30)

## 2011-09-05 LAB — BASIC METABOLIC PANEL
BUN: 15 mg/dL (ref 6–23)
Calcium: 9.3 mg/dL (ref 8.4–10.5)
Chloride: 89 mEq/L — ABNORMAL LOW (ref 96–112)
Creatinine, Ser: 0.82 mg/dL (ref 0.50–1.35)
GFR calc Af Amer: >90 mL/min (ref >90)
GFR calc non Af Amer: >90 mL/min (ref >90)

## 2011-09-05 MED ORDER — GADOBENATE DIMEGLUMINE 529 MG/ML IV SOLN: 17.0000 mL | Freq: Once | INTRAVENOUS | Status: DC

## 2011-09-05 MED ORDER — LEVALBUTEROL HCL 0.63 MG/3ML IN NEBU
0.6300 mg | INHALATION_SOLUTION | RESPIRATORY_TRACT | Status: DC | PRN
  Administered 2011-09-06: 0.63 mg via RESPIRATORY_TRACT
  Filled 2011-09-05: qty 3

## 2011-09-05 MED ORDER — HEPARIN (PORCINE) IN NACL 100-0.45 UNIT/ML-% IJ SOLN
2700.0000 [IU]/h | INTRAMUSCULAR | Status: DC
  Administered 2011-09-05: 1050 [IU]/h via INTRAVENOUS
  Administered 2011-09-05: 800 [IU]/h via INTRAVENOUS
  Administered 2011-09-06: 1300 [IU]/h via INTRAVENOUS
  Administered 2011-09-06: 1600 [IU]/h via INTRAVENOUS
  Administered 2011-09-07: 2000 [IU]/h via INTRAVENOUS
  Administered 2011-09-07: 2500 [IU]/h via INTRAVENOUS
  Administered 2011-09-08 – 2011-09-12 (×10): 2700 [IU]/h via INTRAVENOUS
  Filled 2011-09-05 (×25): qty 250

## 2011-09-05 MED ORDER — ALTEPLASE 2 MG IJ SOLR
2.0000 mg | Freq: Once | INTRAMUSCULAR | Status: AC
  Administered 2011-09-05: 2 mg
  Filled 2011-09-05: qty 2

## 2011-09-05 MED ORDER — CLONAZEPAM 1 MG PO TABS
1.0000 mg | ORAL_TABLET | Freq: Two times a day (BID) | ORAL | Status: DC | PRN
  Administered 2011-09-05: 1 mg via ORAL
  Filled 2011-09-05: qty 1

## 2011-09-05 MED ORDER — LEVALBUTEROL HCL 0.63 MG/3ML IN NEBU
0.6300 mg | INHALATION_SOLUTION | Freq: Four times a day (QID) | RESPIRATORY_TRACT | Status: DC
  Administered 2011-09-05 – 2011-09-11 (×23): 0.63 mg via RESPIRATORY_TRACT
  Filled 2011-09-05 (×29): qty 3
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
## 2011-09-05 NOTE — Progress Notes (Signed)
Medical Student Daily Progress Note  Subjective: Patient slept mediocre last night. His PICC was clotted so much of the night was spent unclotting with TPA, starting a new line, and then discontinuing the new line. He is sleepy this morning and still tired from the events from yesterday. He had a 47 beat run of VTACH over 10 seconds on tele and was transferred to 2900 yesterday. CE's and EKG were negative for ischemic events. He is otherwise in no different state than usual. He is sitting up in bed on exam.  Objective: Vital signs in last 24 hours: Filed Vitals:   09/05/11 0400 09/05/11 0500 09/05/11 0600 09/05/11 0751  BP: 120/63 108/56 121/65   Pulse: 87 85 92   Temp: 98.3 F (36.8 C)     TempSrc: Oral     Resp: 29 25 26    Height:      Weight:      SpO2: 98% 98% 98% 97%   Weight change:   Intake/Output Summary (Last 24 hours) at 09/05/11 0820 Last data filed at 09/05/11 0600  Gross per 24 hour  Intake    400 ml  Output   1075 ml  Net   -675 ml   Physical Exam: BP 121/65  Pulse 92  Temp(Src) 98.3 F (36.8 C) (Oral)  Resp 26  Ht 5\' 10"  (1.778 m)  Wt 82.555 kg (182 lb)  BMI 26.11 kg/m2  SpO2 97% General appearance: cooperative, fatigued and no distress Head: Normocephalic, without obvious abnormality, atraumatic Neck: no adenopathy, no carotid bruit, no JVD, supple, symmetrical, trachea midline and thyroid not enlarged, symmetric, no tenderness/mass/nodules Back: symmetric, no curvature. ROM normal. No CVA tenderness. Lungs: rales RML and "dry" Chest wall: no tenderness Heart: regular rate and rhythm, S1, S2 normal, no murmur, click, rub or gallop Abdomen: soft, non-tender; bowel sounds normal; no masses,  no organomegaly Extremities: extremities normal, atraumatic, no cyanosis or edema Skin: Skin color, texture, turgor normal. No rashes or lesions or old pacer pocket on left upper chest still bandaged Lab Results: Basic Metabolic Panel:  Lab 09/05/11 1610 09/04/11  1800  NA 128* 128*  K 4.3 4.2  CL 89* 90*  CO2 30 30  GLUCOSE 112* 147*  BUN 15 16  CREATININE 0.82 0.85  CALCIUM 9.3 9.1  MG -- 2.2  PHOS -- --   Liver Function Tests:  Lab 08/31/11 1631  AST 36  ALT 21  ALKPHOS 155*  BILITOT 0.2*  PROT 7.8  ALBUMIN 2.0*    Lab 08/29/11 1105  LIPASE --  AMYLASE 49   No results found for this basename: AMMONIA:2 in the last 168 hours CBC:  Lab 09/05/11 0435 09/04/11 0500 09/02/11 0437 08/31/11 1631  WBC 11.4* 10.5 -- --  NEUTROABS -- -- 7.3 6.5  HGB 7.7* 8.0* -- --  HCT 25.3* 25.8* -- --  MCV 79.8 79.9 -- --  PLT 424* 361 -- --   Cardiac Enzymes:  Lab 09/05/11 0211 09/04/11 1946 09/01/11 0500  CKTOTAL 10 12 <7*  CKMB 1.0 1.1 1.1  CKMBINDEX -- -- --  TROPONINI <0.30 <0.30 <0.30   BNP:  Lab 09/01/11 0500  PROBNP 973.1*   D-Dimer: No results found for this basename: DDIMER:2 in the last 168 hours CBG: No results found for this basename: GLUCAP:6 in the last 168 hours Hemoglobin A1C: No results found for this basename: HGBA1C in the last 168 hours Fasting Lipid Panel: No results found for this basename: CHOL,HDL,LDLCALC,TRIG,CHOLHDL,LDLDIRECT in the last 960 hours  Thyroid Function Tests: No results found for this basename: TSH,T4TOTAL,FREET4,T3FREE,THYROIDAB in the last 168 hours Coagulation:  Lab 09/04/11 0500  LABPROT 15.3*  INR 1.18   Anemia Panel: No results found for this basename: VITAMINB12,FOLATE,FERRITIN,TIBC,IRON,RETICCTPCT in the last 168 hours Urine Drug Screen: Drugs of Abuse  No results found for this basename: labopia, cocainscrnur, labbenz, amphetmu, thcu, labbarb    Alcohol Level: No results found for this basename: ETH:2 in the last 168 hours Urinalysis:  Lab 08/31/11 1557  COLORURINE YELLOW  LABSPEC 1.020  PHURINE 7.0  GLUCOSEU NEGATIVE  HGBUR NEGATIVE  BILIRUBINUR NEGATIVE  KETONESUR NEGATIVE  PROTEINUR NEGATIVE  UROBILINOGEN 1.0  NITRITE NEGATIVE  LEUKOCYTESUR NEGATIVE     Micro Results: Recent Results (from the past 240 hour(s))  MRSA PCR SCREENING     Status: Normal   Collection Time   08/27/11  5:41 PM      Component Value Range Status Comment   MRSA by PCR NEGATIVE  NEGATIVE  Final   CULTURE, BLOOD (ROUTINE X 2)     Status: Normal   Collection Time   08/27/11  9:53 PM      Component Value Range Status Comment   Specimen Description BLOOD LEFT FOOT   Final    Special Requests BOTTLES DRAWN AEROBIC AND ANAEROBIC 5CC   Final    Culture  Setup Time [id number]   Final    Culture NO GROWTH 5 DAYS   Final    Report Status 09/03/2011 FINAL   Final   CULTURE, BLOOD (ROUTINE X 2)     Status: Normal   Collection Time   08/27/11  9:59 PM      Component Value Range Status Comment   Specimen Description BLOOD RIGHT FOOT   Final    Special Requests BOTTLES DRAWN AEROBIC AND ANAEROBIC 5CC   Final    Culture  Setup Time [id number]   Final    Culture NO GROWTH 5 DAYS   Final    Report Status 09/03/2011 FINAL   Final   BODY FLUID CULTURE     Status: Normal   Collection Time   08/28/11  3:54 PM      Component Value Range Status Comment   Specimen Description PLEURAL FLUID RIGHT   Final    Special Requests SYR   Final    Gram Stain     Final    Value: RARE WBC PRESENT,BOTH PMN AND MONONUCLEAR     NO ORGANISMS SEEN   Culture NO GROWTH 3 DAYS   Final    Report Status 09/01/2011 FINAL   Final   FUNGUS CULTURE W SMEAR     Status: Normal (Preliminary result)   Collection Time   08/28/11  3:55 PM      Component Value Range Status Comment   Specimen Description PLEURAL FLUID RIGHT   Final    Special Requests SYR   Final    Fungal Smear NO YEAST OR FUNGAL ELEMENTS SEEN   Final    Culture CULTURE IN PROGRESS FOR FOUR WEEKS   Final    Report Status PENDING   Incomplete   ANAEROBIC CULTURE     Status: Normal   Collection Time   08/28/11  3:55 PM      Component Value Range Status Comment   Specimen Description PLEURAL FLUID RIGHT   Final    Special  Requests SYR   Final    Gram Stain     Final    Value:  RARE WBC PRESENT,BOTH PMN AND MONONUCLEAR     NO ORGANISMS SEEN   Culture NO ANAEROBES ISOLATED   Final    Report Status 09/02/2011 FINAL   Final   AFB CULTURE WITH SMEAR     Status: Normal (Preliminary result)   Collection Time   08/28/11  3:56 PM      Component Value Range Status Comment   Specimen Description PLEURAL FLUID RIGHT   Final    Special Requests SYR   Final    ACID FAST SMEAR NO ACID FAST BACILLI SEEN   Final    Culture     Final    Value: CULTURE WILL BE EXAMINED FOR 6 WEEKS BEFORE ISSUING A FINAL REPORT   Report Status PENDING   Incomplete   URINE CULTURE     Status: Normal   Collection Time   08/31/11  3:57 PM      Component Value Range Status Comment   Specimen Description URINE, RANDOM   Final    Special Requests NONE   Final    Culture  Setup Time [id number]   Final    Colony Count 85,000 COLONIES/ML   Final    Culture KLEBSIELLA PNEUMONIAE   Final    Report Status 09/03/2011 FINAL   Final    Organism ID, Bacteria KLEBSIELLA PNEUMONIAE   Final   CULTURE, BLOOD (ROUTINE X 2)     Status: Normal (Preliminary result)   Collection Time   08/31/11  4:07 PM      Component Value Range Status Comment   Specimen Description BLOOD RIGHT HAND   Final    Special Requests BOTTLES DRAWN AEROBIC AND ANAEROBIC 8CC   Final    Culture  Setup Time [id number]   Final    Culture     Final    Value:        BLOOD CULTURE RECEIVED NO GROWTH TO DATE CULTURE WILL BE HELD FOR 5 DAYS BEFORE ISSUING A FINAL NEGATIVE REPORT   Report Status PENDING   Incomplete   CULTURE, BLOOD (ROUTINE X 2)     Status: Normal (Preliminary result)   Collection Time   08/31/11  4:25 PM      Component Value Range Status Comment   Specimen Description BLOOD RIGHT FOOT   Final    Special Requests BOTTLES DRAWN AEROBIC AND ANAEROBIC 10CC   Final    Culture  Setup Time [id number]   Final    Culture     Final    Value:        BLOOD CULTURE  RECEIVED NO GROWTH TO DATE CULTURE WILL BE HELD FOR 5 DAYS BEFORE ISSUING A FINAL NEGATIVE REPORT   Report Status PENDING   Incomplete   MRSA PCR SCREENING     Status: Normal   Collection Time   09/04/11  8:30 PM      Component Value Range Status Comment   MRSA by PCR NEGATIVE  NEGATIVE  Final    Studies/Results: Mr Shirlee Latch Wo Contrast  09/05/2011  *RADIOLOGY REPORT*  Clinical Data:  History of infected cardiac device removed 1 month ago.  Question of infarcts / septic emboli or mycotic aneurysm.  MRI HEAD WITH AND WITHOUT CONTRAST MRA HEAD WITHOUT CONTRAST  Technique: Multiplanar, multiecho pulse sequences of the brain and surrounding structures were obtained according to standard protocol with and without intravenous contrast.  Angiographic images of the Circle of Willis were obtained using MRA technique without intravenous contrast.  Contrast: 17  ml MultiHance.  Comparisonnone.  MRI HEAD  Findings: Motion degraded exam.  No acute infarct.  No intracranial hemorrhage.  Small remote right corona radiata infarct.  Mild nonspecific white matter type changes may reflect result of small vessel disease.  No intracranial mass or abnormal enhancement.  Heterogeneous bone marrow may reflect result of anemia.  Partial opacification inferior aspect of the left mastoid air cells without obstructing lesions seen in the region of the posterior- superior nasopharynx contributing to left sided eustachian tube dysfunction.  Minimal to mild paranasal sinus mucosal thickening.  IMPRESSION: Motion degraded exam.  No acute infarct.  Remote small infarct posterior right corona radiata with scattered nonspecific white matter type changes which may reflect result of small vessel disease.  Mild global atrophy without hydrocephalus.  No abnormal intracranial enhancing lesion/mass.  Heterogeneous bone marrow may reflect result of underlying anemia.  Left mastoid air cell partial opacification and minimal to mild paranasal sinus  mucosal thickening  MRA HEAD  Findings: Small bulge superior margin proximal A1 segment left anterior cerebral artery.  This is not a typical position for a saccular aneurysm or mycotic aneurysm.  Small vessel may arise from this structure.  Stability can be confirmed on follow-up.  Middle cerebral artery and A2 segment anterior cerebral artery branch vessel irregularity without discrete aneurysm.  Anterior circulation without medium or large size vessel significant stenosis or occlusion.  Full extent of the left PICA not included on present examination. Nonvisualization right PICA with large right AICA. Nonvisualization left AICA.  Mild narrowing distal right vertebral artery.  No high-grade stenosis of the basilar artery.  Superior cerebellar artery and posterior cerebral artery mild branch vessel irregularity without discrete aneurysm.  IMPRESSION: Small bulge superior margin proximal A1 segment left anterior cerebral artery.  This is not a typical position for a saccular aneurysm or mycotic aneurysm.  Small vessel may arise from this structure.  Stability can be confirmed on follow-up.  Please see above discussion.  Original Report Authenticated By: Fuller Canada, M.D.   Mr Laqueta Jean Wo Contrast  09/05/2011  *RADIOLOGY REPORT*  Clinical Data:  History of infected cardiac device removed 1 month ago.  Question of infarcts / septic emboli or mycotic aneurysm.  MRI HEAD WITH AND WITHOUT CONTRAST MRA HEAD WITHOUT CONTRAST  Technique: Multiplanar, multiecho pulse sequences of the brain and surrounding structures were obtained according to standard protocol with and without intravenous contrast.  Angiographic images of the Circle of Willis were obtained using MRA technique without intravenous contrast.  Contrast: 17 ml MultiHance.  Comparisonnone.  MRI HEAD  Findings: Motion degraded exam.  No acute infarct.  No intracranial hemorrhage.  Small remote right corona radiata infarct.  Mild nonspecific white matter type  changes may reflect result of small vessel disease.  No intracranial mass or abnormal enhancement.  Heterogeneous bone marrow may reflect result of anemia.  Partial opacification inferior aspect of the left mastoid air cells without obstructing lesions seen in the region of the posterior- superior nasopharynx contributing to left sided eustachian tube dysfunction.  Minimal to mild paranasal sinus mucosal thickening.  IMPRESSION: Motion degraded exam.  No acute infarct.  Remote small infarct posterior right corona radiata with scattered nonspecific white matter type changes which may reflect result of small vessel disease.  Mild global atrophy without hydrocephalus.  No abnormal intracranial enhancing lesion/mass.  Heterogeneous bone marrow may reflect result of underlying anemia.  Left mastoid air cell partial opacification and minimal to mild paranasal sinus mucosal thickening  MRA HEAD  Findings: Small bulge superior margin proximal A1 segment left anterior cerebral artery.  This is not a typical position for a saccular aneurysm or mycotic aneurysm.  Small vessel may arise from this structure.  Stability can be confirmed on follow-up.  Middle cerebral artery and A2 segment anterior cerebral artery branch vessel irregularity without discrete aneurysm.  Anterior circulation without medium or large size vessel significant stenosis or occlusion.  Full extent of the left PICA not included on present examination. Nonvisualization right PICA with large right AICA. Nonvisualization left AICA.  Mild narrowing distal right vertebral artery.  No high-grade stenosis of the basilar artery.  Superior cerebellar artery and posterior cerebral artery mild branch vessel irregularity without discrete aneurysm.  IMPRESSION: Small bulge superior margin proximal A1 segment left anterior cerebral artery.  This is not a typical position for a saccular aneurysm or mycotic aneurysm.  Small vessel may arise from this structure.  Stability  can be confirmed on follow-up.  Please see above discussion.  Original Report Authenticated By: Fuller Canada, M.D.   Medications:  I have reviewed the patient's current medications. Prior to Admission:  Prescriptions prior to admission  Medication Sig Dispense Refill  . acetaminophen (TYLENOL) 325 MG tablet Take 325-650 mg by mouth every 4 (four) hours as needed. For pain/fever      . Ascorbic Acid (VITAMIN C) 100 MG tablet Take 100 mg by mouth daily.      [name] aspirin EC 81 MG EC tablet Take 1 tablet (81 mg total) by mouth daily.      . calcium carbonate (TUMS - DOSED IN MG ELEMENTAL CALCIUM) 500 MG chewable tablet Chew 1 tablet by mouth 3 (three) times daily.      . clonazePAM (KLONOPIN) 0.5 MG tablet Take 1 tablet (0.5 mg total) by mouth 2 (two) times daily as needed for anxiety.  30 tablet  1  . esomeprazole (NEXIUM) 40 MG capsule Take 40 mg by mouth 2 (two) times daily.       . famotidine (PEPCID) 10 MG tablet Take 10 mg by mouth 2 (two) times daily.      . feeding supplement (ENSURE COMPLETE) LIQD Take 237 mLs by mouth 2 (two) times daily between meals.      . Flaxseed, Linseed, 1000 MG CAPS Take 1 capsule (1,000 mg total) by mouth daily.      . furosemide (LASIX) 40 MG tablet Take 1 tablet (40 mg total) by mouth daily.  30 tablet  3  . metoprolol (LOPRESSOR) 50 MG tablet Take 0.5 tablets (25 mg total) by mouth 2 (two) times daily.  30 tablet  1  . Multiple Vitamins-Minerals (MULTIVITAMIN) tablet Take 1 tablet by mouth daily with breakfast.   30 tablet    . oxyCODONE-acetaminophen (PERCOCET) 5-325 MG per tablet Take 1 tablet by mouth every 6 (six) hours as needed for pain. Do not take more than 4000 mg of acetaminophen in 24 hours. Both Tylenol and Percocet contain acetaminophen.  32 tablet  0  . sodium chloride 0.9 % SOLN 100 mL with micafungin 50 MG SOLR 150 mg Inject 150 mg into the vein daily. Dispense quantity sufficient to treat until on 09/26/2011. Patient is to receive Micafungin  via PICC line daily. Via home health agency. PROTECT FROM LIGHT - DO NOT SHAKE  34 application  0  . DISCONTD: acetaminophen (TYLENOL) 325 MG tablet Take 1-2 tablets (325-650 mg total) by mouth every 4 (four) hours as needed.      [name]  polyethylene glycol (MIRALAX / GLYCOLAX) packet Take 17 g by mouth daily as needed.  14 each  11   Anti-infectives     Start     Dose/Rate Route Frequency Ordered Stop   09/04/11 2000   ciprofloxacin (CIPRO) tablet 500 mg        500 mg Oral 2 times daily 09/04/11 1410     09/02/11 1400   fluconazole (DIFLUCAN) IVPB 400 mg        400 mg 200 mL/hr over 60 Minutes Intravenous Every 24 hours 09/02/11 1300     09/02/11 1200   vancomycin (VANCOCIN) 1,250 mg in sodium chloride 0.9 % 250 mL IVPB  Status:  Discontinued        1,250 mg 166.7 mL/hr over 90 Minutes Intravenous Every 12 hours 09/02/11 1001 09/03/11 0947   08/31/11 1600   vancomycin (VANCOCIN) IVPB 1000 mg/200 mL premix  Status:  Discontinued        1,000 mg 200 mL/hr over 60 Minutes Intravenous Every 8 hours 08/31/11 1527 09/02/11 1001   08/31/11 1600   piperacillin-tazobactam (ZOSYN) IVPB 3.375 g  Status:  Discontinued        3.375 g 12.5 mL/hr over 240 Minutes Intravenous 3 times per day 08/31/11 1527 09/04/11 1410   08/27/11 2200   micafungin (MYCAMINE) 150 mg in sodium chloride 0.9 % 100 mL IVPB  Status:  Discontinued        150 mg 100 mL/hr over 1 Hours Intravenous Every 24 hours 08/27/11 2031 09/02/11 1300         Scheduled Meds:   . albuterol  2 puff Inhalation Q6H  . alteplase  2 mg Intracatheter Once  . aspirin EC  81 mg Oral Daily  . calcium carbonate  1 tablet Oral TID WC  . ciprofloxacin  500 mg Oral BID  . docusate sodium  100 mg Oral BID  . enoxaparin (LOVENOX) injection  40 mg Subcutaneous Q24H  . feeding supplement  237 mL Oral BID BM  . fluconazole (DIFLUCAN) IV  400 mg Intravenous Q24H  . furosemide  60 mg Oral Daily  . gadobenate dimeglumine  17 mL Intravenous Once  .  metoprolol  50 mg Oral BID  . multivitamin with minerals  1 tablet Oral Daily  . pantoprazole  40 mg Oral Q1200  . sodium chloride  3 mL Intravenous Q12H  . traZODone  50 mg Oral QHS  . vitamin C  250 mg Oral Daily  . DISCONTD: piperacillin-tazobactam (ZOSYN)  IV  3.375 g Intravenous Q8H   Continuous Infusions:   . sodium chloride 20 mL/hr (09/05/11 0433)   PRN Meds:.acetaminophen, clonazePAM, ipratropium, levalbuterol, oxyCODONE-acetaminophen, polyethylene glycol, sodium chloride Assessment/Plan:  Pleural effusion / pulmonary embolism / pulmonary infarction - Patient is s/p thoracentesis (08/28/11) for R effusion. Most recent CT chest (09/01/11) shows unchanged central right PA Candidal embolus (extension to RML, RLL), and embolus/occlusion of LUL pulmonary artery branches. Moderate right pleural effusion present (possibly loculated). Wedge shaped infarcts noted in RML and LUL.  - continue percocet 5/325 q6prn and tylenol 325-650mg  prn for pain  - continue albuterol 2 puffs QID for added bronchodilation  - continue atrovent q4prn / xopenex q6prn nebs  - continue lasix 60 mg daily  - Pleural fluid AFB cx, fungal stain and cx, anerobic culture: NTD  Acute on chronic systolic CHF 2/2 CAD and ischemic cardiomyopathy - 2D echo (08/23/11) shows EF = 30% with diffuse hypokinesis, mild LVH, and moderately dilated LA. 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  - cardiology consult obtained today - continue ASA 81 mg daily  - continue metoprolol 50 BID  - continue lasix 60 daily--foley removed on 09/02/11  - MRI heart a possibility in the future for f/u of Candidal endocarditis to assess abscess vs. new vegetations  Questionable HAP - Patient spiked fever and had relative leukocytosis on 08/31/11 with RLL consolidation seen on CXR as well. Concerning for pneumonia and/or small effusion.  - d/c vanc (08/31/11>>09/03/11)  - blood cultures x 2 : NTD   Bacteriuria -  Patient's urine culture revealed Klebsiella pneumoniae. 85,000 col/mL. Today he has some leukocytosis (11.4). - d/c zosyn (08/31/11>>09/04/11) - start cipro x 2 days for remainder of course (09/04/11>>)  Fever - Patient had Candidal embolus (from atrial lead on former ICD) to R pulmonary artery on 08/15/11. Fevers likely due to gradual pieces of the mass breaking off, with appropriate immune response causing him to have fevers every couple of days. Question of his old pacer pocket having an abscess/infection low possibility at this point but has been considered (no warmth, erythema, tenderness, disproportionate swelling).  - Appreciate ID recommendations and consult in the management of this patient  - blood cultures x 2 pending: NTD  - change micafungin (08/15/11>>09/01/11) to IV fluconazole 400 mg daily to finish 6 week course (08/15/11>>)  - Candida sensitivities are back from reference lab: sensitive to both micafungin and fluconazole   Distal Femoral Vein thrombosis - Patient had LE doppler on 09/03/11 that showed DVT in right femoral vein distally. He has been on Lovenox since 08/29/11 and was put on SCD's on admission 08/27/11 prior to beginning Lovenox. Although LE doppler revealed DVT, question of how long patient may have had the clot remains. He was discharged 5/24 and readmitted 5/28 with minimal activity while home. While inpatient last admit and this admission he has received Lovenox for DVT ppx. After consultation yesterday, best course of action is to MRI/MRA brain and if no mycotic aneurysms or septic emboli seen, then to begin anticoagulation. If present, then IVC filter is the only feasible option left. - MRI/MRA head on 09/04/11: see read. Notable small bulge proximal A1 segment of left ACA (not a typical place for mycotic aneurysm however) - will d/c Lovenox ppx patient has been on - will begin heparin per pharmacy, no bolus, with target lower end of therapeutic range - if patient does not bleed  with heparin he would be a good candidate for transition to Coumadin for appropriate length of time 2/2 DVT, if he does begin to bleed with heparin, he will require reversal and imminent IVC filter  GERD / Esophageal Candidiasis - Stable. He does note some minor discomfort with swallowing, however he also states that the dilation on 08/13/11 was not a complete dilation that he is used to.  - continue fluconazole  - continue Protonix 40mg  daily   Tachycardia - Patient continues to have episodes of tachycardia (90's -110's). SOB, Stress response, and anxiety contributing components of this. His pain management (left sided and due to his infarcted lung) is well under control with his percocet, however he sometimes waits until the pain is unbearable before asking for medication. He has been informed to try asking for pain management when the pain first begins.  - metoprolol 50mg  BID  - continue percocet   Anxiety - Patient remains anxious and sleep deprived. His anxiety medicine does work when he takes it.  -  continue clonazepam 0.5 mg BID PRN   Insomnia - Patient reports not sleeping well at night. He does note that his PRN percocet does make him sleepy so I advised him to try taking his dose later in the evening to help him with sleep.  - patient will ask for percocet before bedtime  - continue trazodone 50 mg qhs   Normocytic anemia - Hgb 7.7 today. MCV 79.8. His hemoglobin has trended down recently but seems to be stabilizing around this range for now. 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 Iron = 21, Sat ratio = 9, TIBC =243, Ferritin = 597, Retic count =1.9, FOBT negative. Smear revealed mild anemia with normal morphology. Side effect of micafungin can be suppression of RBC production (3-10%).  - this is being monitored very closely with intervention of PRBC soon if necessary - FOBT negative (09/03/11), will recheck with next stool - continue to check CBC  - micafungin has been  discontinued x 3 days now   Hyponatremia - Na = 128 today. Likely 2/2 his CHF and removal of his biV ICD.  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 His IgM and IgA were increased during his last admission, which could likely be due to his acute illnesses. Hematology consult during previous admission reccommended follow-up after his current infection is completely resolved.  - repeat SPEP/UPEP after full resolution of Candidal infection   Disposition - Cardio consult today for assistance with management of his HF. He is rather jaded today from yesterday's events. Possible heart MRI tomorrow.   LOS: 9 days   This is a Psychologist, occupational Note.  The care of the patient was discussed with Dr. Margorie John and the assessment and plan formulated with their assistance.  Please see their attached note for official documentation of the daily encounter.  Lewie Chamber 09/05/2011, 8:20 AM  Resident Addendum to Medical Student Note   I have seen and examined the patient, and agree with the the medical student assessment and plan outlined above. Please see my brief note below for additional details.  HPI: Patient is feeling fine today, is exhausted after difficulties with lines last night.   OBJECTIVE: VS: Reviewed  Meds: Reviewed  Labs: Reviewed  Imaging: Reviewed   Physical Exam: General: Vital signs reviewed and noted. Well-developed, well-nourished, in no acute distress; alert, appropriate and cooperative throughout examination.  Lungs:  Normal respiratory effort. Crackles on right middle lung field  Heart: RRR. S1 and S2 normal without gallop, murmur, or rubs.  Abdomen:  BS normoactive. Soft, Nondistended, non-tender.  No masses or organomegaly.  Extremities: No pretibial edema.     ASSESSMENT/ PLAN:  Concern for candidal valvular lesion- Will hold off of cardiac MRI since low suspicion per cardiology consultation  DVT- spoke to Dr. Ninetta Lights and Dr. 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 IVC filter in patient with poor cardiopulmonary reserve -MRA head and rule out mycotic emboli/aneurysm before proceeding with judicious anticoagulation with lovenox -began anticoagulation with low dose heparin with no bolus since it will be quickly reversible -will monitor CBC and fecal occult blood for GI bleeding -will only consider IVC filter if have to stop anticoagulation due to bleeding  Dyspnea- patient's dyspnea seems to be mainly due to feeling of anxiety -appreciate cardiology input in evaluating his fluid status  Length of Stay: 9   Margorie John, M.D. [place],  Internal Medicine Resident 09/05/2011, 4:24 PM

## 2011-09-05 NOTE — Progress Notes (Signed)
PT Cancellation Note  Treatment cancelled today due to medical issues with patient which prohibited therapy: pt with new DVT and to begin Heparin today at 1330. Noted has been on Lovenox and pt is a high risk for bleeding with transition to Heparin being done slowly.  Will resume PT/activity once therapeutic on Heparin.  [name] 09/05/2011, 11:32 AM Pager [phone]

## 2011-09-05 NOTE — Consult Note (Signed)
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 Data:  Patient with pleuritic chest pain, recent finding of pulmonary embolus, right-sided pleural effusion  ULTRASOUND GUIDED right THORACENTESIS  Comparison:  None  An ultrasound guided thoracentesis was thoroughly discussed with the patient and questions answered.  The benefits, risks, alternatives and complications were also discussed.  The patient understands and wishes to proceed with the procedure.  Written consent was obtained.  Ultrasound was performed to localize and mark an adequate pocket of fluid in the right chest.  The area was then prepped and draped in the normal sterile fashion.  1% Lidocaine was used for local anesthesia.  Under ultrasound guidance a 19 gauge Yueh catheter was introduced.  Thoracentesis was performed.  The catheter was removed and a dressing applied.  Complications:  The patient had mild vasovagal response post procedure with a slight drop in his blood pressure to 98/72. He recovered well after and had no loss of consciousness. BP was then back to baseline at 119/72  Findings: A total of approximately 240 ml of blood tinged serous fluid was removed. A fluid sample was sent for laboratory analysis.  IMPRESSION: Successful ultrasound guided right thoracentesis yielding 240 ml of  pleural fluid.  Read by Brayton El PA-C  Original Report Authenticated By: Reola Calkins, M.D.   EKG: sinus tach 114bpm LBBB  Physical Exam: Blood pressure 117/69, pulse 109, temperature 98.3 F (36.8 C), temperature source Oral, resp. rate 28, height 5\' 10"  (1.778 m), weight 182 lb (82.555 kg), SpO2 99.00%. General: Well developed middle aged WM in no acute distress laying approximately 20 degrees in bed. Head: Normocephalic, atraumatic, sclera non-icteric, no xanthomas, nares are without discharge.  Neck: Negative for carotid bruits. JVD not elevated. Lungs: Coarse bilaterally, LLL wheeze. Decreased BS on the right side. No rhonchi. Breathing is unlabored. Heart: Reg rhythm but mildly tachycardic with S1 S2. No murmurs, rubs, or gallops appreciated. Abdomen: Soft, non-tender, non-distended with normoactive bowel sounds. No hepatomegaly. No rebound/guarding. No obvious abdominal masses. Msk:  Strength and tone appear normal for age. Extremities: No clubbing or cyanosis. No edema.  Distal pedal pulses are 2+ and equal bilaterally. Pacemaker site RUE without erythema or swelling or tenderness. Neuro: Alert and oriented X 3. Moves all extremities spontaneously. Psych:  Responds to questions appropriately with a normal affect.   Assessment and Plan:   1. Fevers/weight loss with candidal esophagitis/endocarditis diagnosed 07/2011 - on course of antifungal therapy per ID. Micafungin was changed to fluconazole this admission (cx was reportedly sensitive to both). S/p Bi-V ICD extraction. Unclear why he developed fungal infection to begin with- Dr. Elease Hashimoto has previously suggested ?instrumentation from his esophageal dilatations. He also has a gammopathy of unclear significance.  2. Septic pulmonary emboli/pulmonary infarct following BiV ICD extraction - on fluconazole. No role for lobectomy per TCTS. 3. Acute R DVT diagnosed this admission - heparin has been initiated, there is still a question  of whether coumadin will be initiated vs IVC filter. 4. 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 Not acutely volume overloaded. Continue current management. Historically the patient has reported an intolerance to ACEI with cough, less clear intolerance to ARB - Dr. Elease Hashimoto reports hypotension in the past with fatigue with ARB, would consider initiation of low-dose ARB pending BPs (SBP currently 105 so will hold off for right now). 5. CAD s/p CABG - continue ASA, BB. No statin due to intolerance. CP is pleuritic in nature and likely related to pulm issues. 6. NSVT - most recent Mg, K were okay. Continue BB. Likely related to his ICM. Continue to monitor. Will have to ensure clearing of infection before considering re-implantation of ICD at some point. 7. Hyponatremia -?related to lung disease. Follow. 8. Anemia/thrombocytopenia - FOBT negative, being monitored by primary team. Follow closely especially in light of heparin.   We also discussed the case with Dr. Elease Hashimoto who saw the patient socially this admission and knows the patient well - yesterday he did not feel repeat TEE was warranted & he is also not sure of the utility of a cardiac MRI (intracardiac abscess was being considered per discussion with ID but there has been no evidence of heart  block, abnormal cardiac enzymes so this is felt less likely).  See below for comprehensive thoughts after discussion with Dr. Antoine Poche.  Signed, Dayna Dunn PA-C 09/05/2011, 11:03 AM   History and all data above reviewed.  Patient examined.  I agree with the findings as above.  Complicated history.  We are asked to follow for CHF management.  Also there was a question of needing TEE.The patient exam reveals COR:RRR, no rub  ,  Lungs: Decreased BS left greater than right, no crackles  ,  Abd: Positive bowel sounds, no rebound no guarding, Ext No edema  .  All available labs, radiology testing, previous records reviewed.  Agree with documented assessment and plan. Discussed with Dr. Elease Hashimoto who has been aware of recent events.  At this time no indication for TEE.  If, in the future there is a question of abscess or focal cardiac source of infection other than valvular CT or MRI might be more helpful particularly given his esophageal problems.  As far as the CHF goes he seems to be fairly euvolemic at this point.  Continue with current therapy and we will follow.  Fayrene Fearing Tamario Heal  2:26 PM  09/05/2011

## 2011-09-05 NOTE — Progress Notes (Signed)
INFECTIOUS DISEASE PROGRESS NOTE  ID: [name] is a 66 y.o. male with   Principal Problem:  *Candidemia Active Problems:  Chronic systolic congestive heart failure  Normocytic anemia  Esophageal stricture  Thrombocytopenia  Septic embolism  Candidal endocarditis  Fever  Pleural effusion  Malnutrition  V-tach  Pulmonary embolism, septic  Subjective: C/o feeling warm  Abtx:  Anti-infectives     Start     Dose/Rate Route Frequency Ordered Stop   09/04/11 2000   ciprofloxacin (CIPRO) tablet 500 mg        500 mg Oral 2 times daily 09/04/11 1410     09/02/11 1400   fluconazole (DIFLUCAN) IVPB 400 mg        400 mg 200 mL/hr over 60 Minutes Intravenous Every 24 hours 09/02/11 1300     09/02/11 1200   vancomycin (VANCOCIN) 1,250 mg in sodium chloride 0.9 % 250 mL IVPB  Status:  Discontinued        1,250 mg 166.7 mL/hr over 90 Minutes Intravenous Every 12 hours 09/02/11 1001 09/03/11 0947   08/31/11 1600   vancomycin (VANCOCIN) IVPB 1000 mg/200 mL premix  Status:  Discontinued        1,000 mg 200 mL/hr over 60 Minutes Intravenous Every 8 hours 08/31/11 1527 09/02/11 1001   08/31/11 1600   piperacillin-tazobactam (ZOSYN) IVPB 3.375 g  Status:  Discontinued        3.375 g 12.5 mL/hr over 240 Minutes Intravenous 3 times per day 08/31/11 1527 09/04/11 1410   08/27/11 2200   micafungin (MYCAMINE) 150 mg in sodium chloride 0.9 % 100 mL IVPB  Status:  Discontinued        150 mg 100 mL/hr over 1 Hours Intravenous Every 24 hours 08/27/11 2031 09/02/11 1300          Medications:  Scheduled:   . alteplase  2 mg Intracatheter Once  . aspirin EC  81 mg Oral Daily  . ciprofloxacin  500 mg Oral BID  . docusate sodium  100 mg Oral BID  . feeding supplement  237 mL Oral BID BM  . fluconazole (DIFLUCAN) IV  400 mg Intravenous Q24H  . furosemide  60 mg Oral Daily  . levalbuterol  0.63 mg Nebulization Q6H  . metoprolol  50 mg Oral BID  . pantoprazole  40 mg Oral Q1200  .  sodium chloride  3 mL Intravenous Q12H  . traZODone  50 mg Oral QHS  . DISCONTD: albuterol  2 puff Inhalation Q6H  . DISCONTD: calcium carbonate  1 tablet Oral TID WC  . DISCONTD: enoxaparin (LOVENOX) injection  40 mg Subcutaneous Q24H  . DISCONTD: gadobenate dimeglumine  17 mL Intravenous Once  . DISCONTD: multivitamin with minerals  1 tablet Oral Daily  . DISCONTD: vitamin C  250 mg Oral Daily    Objective: Vital signs in last 24 hours: Temp:  [98.3 F (36.8 C)-99.6 F (37.6 C)] 98.8 F (37.1 C) (06/06 1200) Pulse Rate:  [76-120] 104  (06/06 1400) Resp:  [24-35] 31  (06/06 1400) BP: (94-134)/(52-73) 111/53 mmHg (06/06 1400) SpO2:  [89 %-100 %] 98 % (06/06 1400)   General appearance: alert, cooperative and moderate distress Resp: clear to auscultation bilaterally Chest wall: no tenderness, LUE wound more fluctuant. no skin breakdown, less bruising.  Cardio: tachycardia GI: normal findings: bowel sounds normal and soft, non-tender  Lab Results  Basename 09/05/11 0435 09/04/11 1800 09/04/11 0500  WBC 11.4* -- 10.5  HGB 7.7* -- 8.0*  HCT 25.3* -- 25.8*  NA 128* 128* --  K 4.3 4.2 --  CL 89* 90* --  CO2 30 30 --  BUN 15 16 --  CREATININE 0.82 0.85 --  GLU -- -- --   Liver Panel No results found for this basename: PROT:2,ALBUMIN:2,AST:2,ALT:2,ALKPHOS:2,BILITOT:2,BILIDIR:2,IBILI:2 in the last 72 hours Sedimentation Rate No results found for this basename: ESRSEDRATE in the last 72 hours C-Reactive Protein No results found for this basename: CRP:2 in the last 72 hours  Microbiology: Recent Results (from the past 240 hour(s))  MRSA PCR SCREENING     Status: Normal   Collection Time   08/27/11  5:41 PM      Component Value Range Status Comment   MRSA by PCR NEGATIVE  NEGATIVE  Final   CULTURE, BLOOD (ROUTINE X 2)     Status: Normal   Collection Time   08/27/11  9:53 PM      Component Value Range Status Comment   Specimen Description BLOOD LEFT FOOT   Final     Special Requests BOTTLES DRAWN AEROBIC AND ANAEROBIC 5CC   Final    Culture  Setup Time [id number]   Final    Culture NO GROWTH 5 DAYS   Final    Report Status 09/03/2011 FINAL   Final   CULTURE, BLOOD (ROUTINE X 2)     Status: Normal   Collection Time   08/27/11  9:59 PM      Component Value Range Status Comment   Specimen Description BLOOD RIGHT FOOT   Final    Special Requests BOTTLES DRAWN AEROBIC AND ANAEROBIC 5CC   Final    Culture  Setup Time [id number]   Final    Culture NO GROWTH 5 DAYS   Final    Report Status 09/03/2011 FINAL   Final   BODY FLUID CULTURE     Status: Normal   Collection Time   08/28/11  3:54 PM      Component Value Range Status Comment   Specimen Description PLEURAL FLUID RIGHT   Final    Special Requests SYR   Final    Gram Stain     Final    Value: RARE WBC PRESENT,BOTH PMN AND MONONUCLEAR     NO ORGANISMS SEEN   Culture NO GROWTH 3 DAYS   Final    Report Status 09/01/2011 FINAL   Final   FUNGUS CULTURE W SMEAR     Status: Normal (Preliminary result)   Collection Time   08/28/11  3:55 PM      Component Value Range Status Comment   Specimen Description PLEURAL FLUID RIGHT   Final    Special Requests SYR   Final    Fungal Smear NO YEAST OR FUNGAL ELEMENTS SEEN   Final    Culture CULTURE IN PROGRESS FOR FOUR WEEKS   Final    Report Status PENDING   Incomplete   ANAEROBIC CULTURE     Status: Normal   Collection Time   08/28/11  3:55 PM      Component Value Range Status Comment   Specimen Description PLEURAL FLUID RIGHT   Final    Special Requests SYR   Final    Gram Stain     Final    Value: RARE WBC PRESENT,BOTH PMN AND MONONUCLEAR     NO ORGANISMS SEEN   Culture NO ANAEROBES ISOLATED   Final    Report Status 09/02/2011 FINAL   Final   AFB CULTURE WITH  SMEAR     Status: Normal (Preliminary result)   Collection Time   08/28/11  3:56 PM      Component Value Range Status Comment   Specimen Description PLEURAL FLUID RIGHT   Final     Special Requests SYR   Final    ACID FAST SMEAR NO ACID FAST BACILLI SEEN   Final    Culture     Final    Value: CULTURE WILL BE EXAMINED FOR 6 WEEKS BEFORE ISSUING A FINAL REPORT   Report Status PENDING   Incomplete   URINE CULTURE     Status: Normal   Collection Time   08/31/11  3:57 PM      Component Value Range Status Comment   Specimen Description URINE, RANDOM   Final    Special Requests NONE   Final    Culture  Setup Time [id number]   Final    Colony Count 85,000 COLONIES/ML   Final    Culture KLEBSIELLA PNEUMONIAE   Final    Report Status 09/03/2011 FINAL   Final    Organism ID, Bacteria KLEBSIELLA PNEUMONIAE   Final   CULTURE, BLOOD (ROUTINE X 2)     Status: Normal (Preliminary result)   Collection Time   08/31/11  4:07 PM      Component Value Range Status Comment   Specimen Description BLOOD RIGHT HAND   Final    Special Requests BOTTLES DRAWN AEROBIC AND ANAEROBIC 8CC   Final    Culture  Setup Time [id number]   Final    Culture     Final    Value:        BLOOD CULTURE RECEIVED NO GROWTH TO DATE CULTURE WILL BE HELD FOR 5 DAYS BEFORE ISSUING A FINAL NEGATIVE REPORT   Report Status PENDING   Incomplete   CULTURE, BLOOD (ROUTINE X 2)     Status: Normal (Preliminary result)   Collection Time   08/31/11  4:25 PM      Component Value Range Status Comment   Specimen Description BLOOD RIGHT FOOT   Final    Special Requests BOTTLES DRAWN AEROBIC AND ANAEROBIC 10CC   Final    Culture  Setup Time [id number]   Final    Culture     Final    Value:        BLOOD CULTURE RECEIVED NO GROWTH TO DATE CULTURE WILL BE HELD FOR 5 DAYS BEFORE ISSUING A FINAL NEGATIVE REPORT   Report Status PENDING   Incomplete   MRSA PCR SCREENING     Status: Normal   Collection Time   09/04/11  8:30 PM      Component Value Range Status Comment   MRSA by PCR NEGATIVE  NEGATIVE  Final     Studies/Results: Mr Shirlee Latch Wo Contrast  09/05/2011  *RADIOLOGY REPORT*  Clinical Data:  History of infected  cardiac device removed 1 month ago.  Question of infarcts / septic emboli or mycotic aneurysm.  MRI HEAD WITH AND WITHOUT CONTRAST MRA HEAD WITHOUT CONTRAST  Technique: Multiplanar, multiecho pulse sequences of the brain and surrounding structures were obtained according to standard protocol with and without intravenous contrast.  Angiographic images of the Circle of Willis were obtained using MRA technique without intravenous contrast.  Contrast: 17 ml MultiHance.  Comparisonnone.  MRI HEAD  Findings: Motion degraded exam.  No acute infarct.  No intracranial hemorrhage.  Small remote right corona radiata infarct.  Mild nonspecific white matter type changes may  reflect result of small vessel disease.  No intracranial mass or abnormal enhancement.  Heterogeneous bone marrow may reflect result of anemia.  Partial opacification inferior aspect of the left mastoid air cells without obstructing lesions seen in the region of the posterior- superior nasopharynx contributing to left sided eustachian tube dysfunction.  Minimal to mild paranasal sinus mucosal thickening.  IMPRESSION: Motion degraded exam.  No acute infarct.  Remote small infarct posterior right corona radiata with scattered nonspecific white matter type changes which may reflect result of small vessel disease.  Mild global atrophy without hydrocephalus.  No abnormal intracranial enhancing lesion/mass.  Heterogeneous bone marrow may reflect result of underlying anemia.  Left mastoid air cell partial opacification and minimal to mild paranasal sinus mucosal thickening  MRA HEAD  Findings: Small bulge superior margin proximal A1 segment left anterior cerebral artery.  This is not a typical position for a saccular aneurysm or mycotic aneurysm.  Small vessel may arise from this structure.  Stability can be confirmed on follow-up.  Middle cerebral artery and A2 segment anterior cerebral artery branch vessel irregularity without discrete aneurysm.  Anterior  circulation without medium or large size vessel significant stenosis or occlusion.  Full extent of the left PICA not included on present examination. Nonvisualization right PICA with large right AICA. Nonvisualization left AICA.  Mild narrowing distal right vertebral artery.  No high-grade stenosis of the basilar artery.  Superior cerebellar artery and posterior cerebral artery mild branch vessel irregularity without discrete aneurysm.  IMPRESSION: Small bulge superior margin proximal A1 segment left anterior cerebral artery.  This is not a typical position for a saccular aneurysm or mycotic aneurysm.  Small vessel may arise from this structure.  Stability can be confirmed on follow-up.  Please see above discussion.  Original Report Authenticated By: Fuller Canada, M.D.   Mr Laqueta Jean Wo Contrast  09/05/2011  *RADIOLOGY REPORT*  Clinical Data:  History of infected cardiac device removed 1 month ago.  Question of infarcts / septic emboli or mycotic aneurysm.  MRI HEAD WITH AND WITHOUT CONTRAST MRA HEAD WITHOUT CONTRAST  Technique: Multiplanar, multiecho pulse sequences of the brain and surrounding structures were obtained according to standard protocol with and without intravenous contrast.  Angiographic images of the Circle of Willis were obtained using MRA technique without intravenous contrast.  Contrast: 17 ml MultiHance.  Comparisonnone.  MRI HEAD  Findings: Motion degraded exam.  No acute infarct.  No intracranial hemorrhage.  Small remote right corona radiata infarct.  Mild nonspecific white matter type changes may reflect result of small vessel disease.  No intracranial mass or abnormal enhancement.  Heterogeneous bone marrow may reflect result of anemia.  Partial opacification inferior aspect of the left mastoid air cells without obstructing lesions seen in the region of the posterior- superior nasopharynx contributing to left sided eustachian tube dysfunction.  Minimal to mild paranasal sinus mucosal  thickening.  IMPRESSION: Motion degraded exam.  No acute infarct.  Remote small infarct posterior right corona radiata with scattered nonspecific white matter type changes which may reflect result of small vessel disease.  Mild global atrophy without hydrocephalus.  No abnormal intracranial enhancing lesion/mass.  Heterogeneous bone marrow may reflect result of underlying anemia.  Left mastoid air cell partial opacification and minimal to mild paranasal sinus mucosal thickening  MRA HEAD  Findings: Small bulge superior margin proximal A1 segment left anterior cerebral artery.  This is not a typical position for a saccular aneurysm or mycotic aneurysm.  Small vessel may arise  from this structure.  Stability can be confirmed on follow-up.  Middle cerebral artery and A2 segment anterior cerebral artery branch vessel irregularity without discrete aneurysm.  Anterior circulation without medium or large size vessel significant stenosis or occlusion.  Full extent of the left PICA not included on present examination. Nonvisualization right PICA with large right AICA. Nonvisualization left AICA.  Mild narrowing distal right vertebral artery.  No high-grade stenosis of the basilar artery.  Superior cerebellar artery and posterior cerebral artery mild branch vessel irregularity without discrete aneurysm.  IMPRESSION: Small bulge superior margin proximal A1 segment left anterior cerebral artery.  This is not a typical position for a saccular aneurysm or mycotic aneurysm.  Small vessel may arise from this structure.  Stability can be confirmed on follow-up.  Please see above discussion.  Original Report Authenticated By: Fuller Canada, M.D.     Assessment/Plan: Pulmonary Infarct/septic emboli  Candidemia  K pneumo UTI (6-1)  Prev Pacer Pocket site inflamation  DVT RLE  Would-  Continue diflucan (Day 23 antifungal therapy)  Day 5 anbx (zosyn ---> cipro) Now on heparin. Control VT Consider stop cipro at day 10 My  great appreciation to the B [facility].   Johny Sax Infectious Diseases [phone] 09/05/2011, 2:37 PM   LOS: 9 days

## 2011-09-05 NOTE — Progress Notes (Signed)
ANTICOAGULATION CONSULT NOTE - Follow Up Consult  Pharmacy Consult for Heparin Indication: Septic emobli to lung  Allergies  Allergen Reactions  . Avapro (Irbesartan)     unknown  . Codeine     unknown  . Crestor (Rosuvastatin Calcium)     unknown  . Lipitor (Atorvastatin Calcium)     unknown  . Lisinopril Cough  . Morphine     unknown  . Zocor (Simvastatin - High Dose)     unknown    Patient Measurements: Height: 5\' 10"  (177.8 cm) Weight: 182 lb (82.555 kg) IBW/kg (Calculated) : 73  Heparin Dosing Weight: 82.6 kg  Vital Signs: Temp: 98.8 F (37.1 C) (06/06 1200) Temp src: Oral (06/06 1200) BP: 92/50 mmHg (06/06 1700) Pulse Rate: 100  (06/06 1800)  Labs:  Basename 09/05/11 1919 09/05/11 1830 09/05/11 1620 09/05/11 1335 09/05/11 0435 09/05/11 0211 09/04/11 1800 09/04/11 0500  HGB -- -- 7.6* -- 7.7* -- -- --  HCT -- -- 24.5* -- 25.3* -- -- 25.8*  PLT -- -- 400 -- 424* -- -- 361  APTT -- -- -- -- -- -- -- 37  LABPROT -- -- -- -- -- -- -- 15.3*  INR -- -- -- -- -- -- -- 1.18  HEPARINUNFRC <0.10* -- -- -- -- -- -- --  CREATININE -- -- -- -- 0.82 -- 0.85 0.87  CKTOTAL -- 14 -- 11 -- 10 -- --  CKMB -- 1.0 -- 1.2 -- 1.0 -- --  TROPONINI -- <0.30 -- <0.30 -- <0.30 -- --    Estimated Creatinine Clearance: 92.7 ml/min (by C-G formula based on Cr of 0.82).   Assessment: 66 y.o. M on heparin for septic lung emboli as a results of candidal endocarditis/HCAP with a SUBtherapeutic heparin level this evening (HL <0.1, goal 49f 0.3-0.5) He also has history of recent fungal PE (5/19) and new RLE DVT this admission (6/4). Per Dr. Moshe Cipro note on 6/5, patients with endocarditis do poorly with anti-coagulation. Given his high risk for bleeding, we will not bolus and will target a lower heparin level goal of 0.3-0.5 units/ml. Hgb/Hct/Plt stable since starting heparin. No bleeding noted at this time  Goal of Therapy:  Heparin level 0.3-0.5 units/ml Monitor platelets by  anticoagulation protocol: Yes   Plan:  1. Increase heparin drip rate to 1050 units/hr (10.5 ml/hr) 2. Will continue to monitor for any signs/symptoms of bleeding and will follow up with heparin level in 6 hours   Georgina Pillion, PharmD, BCPS Clinical Pharmacist Pager: [phone] 09/05/2011 8:18 PM

## 2011-09-05 NOTE — Progress Notes (Signed)
Internal Medicine Attending  Date: 09/05/2011  Patient name: [name] Medical record number: [id number] Date of birth: [birth year] Age: 66 y.o. Gender: male  I saw and evaluated the patient, and discussed his care on A.M. Rounds with housestaff.  He was transferred into the 2900 unit overnight because of runs of nonsustained ventricular tachycardia.  He currently has no new complaints, and denies chest pain, palpitations, or worsening shortness of breath.  Exam is notable for bibasilar crackles; regular rhythm; soft nontender abdomen, bowel sounds present; no edema.  Labs are notable for hemoglobin of 7.7, platelets 424, sodium 128, potassium 4.3, and creatinine 0.82.  We reviewed his MRI/MRA of the brain with radiologist, who did not feel there is any evidence of septic embolism or mycotic aneurysm.  Plan is monitor in the ICU setting; careful heparinization without bolus for treatment of his acute DVT, with a target heparin level in the lower end of the therapeutic range; follow hemoglobin closely and fecal occult blood (which has been negative) for any evidence of GI bleeding on anticoagulation; transfuse if indicated; continue fluconazole for Candidal endocarditis; cardiology consult regarding his nonsustained ventricular tachycardia.

## 2011-09-06 ENCOUNTER — Inpatient Hospital Stay (HOSPITAL_COMMUNITY): Payer: Medicare Other

## 2011-09-06 DIAGNOSIS — K222 Esophageal obstruction: Secondary | ICD-10-CM

## 2011-09-06 DIAGNOSIS — B376 Candidal endocarditis: Secondary | ICD-10-CM

## 2011-09-06 LAB — CBC
HCT: 23.8 % — ABNORMAL LOW (ref 39.0–52.0)
MCH: 25.1 pg — ABNORMAL LOW (ref 26.0–34.0)
MCV: 80.7 fL (ref 78.0–100.0)
MCV: 79.8 fL (ref 78.0–100.0)
Platelets: 421 10*3/uL — ABNORMAL HIGH (ref 150–400)
RBC: 2.95 MIL/uL — ABNORMAL LOW (ref 4.22–5.81)
RDW: 16.2 % — ABNORMAL HIGH (ref 11.5–15.5)
WBC: 9 10*3/uL (ref 4.0–10.5)
WBC: 12.2 10*3/uL — ABNORMAL HIGH (ref 4.0–10.5)

## 2011-09-06 LAB — CULTURE, BLOOD (ROUTINE X 2)
Culture  Setup Time: 201306012012
Culture: NO GROWTH

## 2011-09-06 LAB — OCCULT BLOOD X 1 CARD TO LAB, STOOL: Fecal Occult Bld: NEGATIVE

## 2011-09-06 LAB — BASIC METABOLIC PANEL
BUN: 14 mg/dL (ref 6–23)
CO2: 29 mEq/L (ref 19–32)
Chloride: 95 mEq/L — ABNORMAL LOW (ref 96–112)
Creatinine, Ser: 0.71 mg/dL (ref 0.50–1.35)
Glucose, Bld: 106 mg/dL — ABNORMAL HIGH (ref 70–99)

## 2011-09-06 LAB — HEPARIN LEVEL (UNFRACTIONATED)
Heparin Unfractionated: <0.1 IU/mL — ABNORMAL LOW (ref 0.30–0.70)
Heparin Unfractionated: <0.1 IU/mL — ABNORMAL LOW (ref 0.30–0.70)

## 2011-09-06 LAB — CARDIAC PANEL(CRET KIN+CKTOT+MB+TROPI)
CK, MB: 1.2 ng/mL (ref 0.3–4.0)
Relative Index: INVALID (ref 0.0–2.5)
Total CK: 8 U/L (ref 7–232)
Troponin I: <0.3 ng/mL (ref <0.30)

## 2011-09-06 MED ORDER — CLONAZEPAM 0.5 MG PO TABS
0.5000 mg | ORAL_TABLET | Freq: Two times a day (BID) | ORAL | Status: DC | PRN
  Administered 2011-09-08 (×2): 0.5 mg via ORAL
  Filled 2011-09-06 (×2): qty 1

## 2011-09-06 NOTE — Progress Notes (Signed)
Pt became tachypnic and coughing; oxygen saturation sustained in the mid 90s.  RT paged - and breathing treatment given; will continue to monitor and update as needed

## 2011-09-06 NOTE — Progress Notes (Signed)
ANTICOAGULATION CONSULT NOTE - Follow Up Consult  Pharmacy Consult for heparin Indication: septic emboli to lung  Labs:  Basename 09/06/11 1250 09/06/11 0450 09/06/11 0410 09/05/11 2322 09/05/11 1919 09/05/11 1830 09/05/11 1620 09/05/11 0435 09/04/11 1800 09/04/11 0500  HGB -- -- 7.4* -- -- -- 7.6* -- -- --  HCT -- -- 23.8* -- -- -- 24.5* 25.3* -- --  PLT -- -- 395 -- -- -- 400 424* -- --  APTT -- -- -- -- -- -- -- -- -- 37  LABPROT -- -- -- -- -- -- -- -- -- 15.3*  INR -- -- -- -- -- -- -- -- -- 1.18  HEPARINUNFRC <0.10* <0.10* -- -- <0.10* -- -- -- -- --  CREATININE -- -- 0.71 -- -- -- -- 0.82 0.85 --  CKTOTAL -- -- 8 8 -- 14 -- -- -- --  CKMB -- -- 1.2 1.2 -- 1.0 -- -- -- --  TROPONINI -- -- <0.30 <0.30 -- <0.30 -- -- -- --   Assessment: 63 YOM recently discharged from [facility] after being 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 Patient re-admitted 08/27/11 with fevers, coughing and chest pain. .   Anticoagulation: recent fungal PE (5/19), new RLE DVT (6/4) - per Dr. Ninetta Lights, patients due poorly on A/C with endocarditis - questioning whether we are far enough removed to be safe - now with septic emboli to the lung and to start heparin gtt (orders to target lowest goal, no bolus due to high risk of bleeding), INR 1.18, fecal occult (-). Heparin level remains undetectable despite rate increase.   Goal of Therapy:  Heparin level 0.3-0.5 units/ml   Plan:  Increase heparin to 1600 units/hr and recheck level in 6 hrs.  Pasty Spillers PharmD BCPS 09/06/2011,2:13 PM

## 2011-09-06 NOTE — Progress Notes (Signed)
Internal Medicine Attending  Date: 09/06/2011  Patient name: [name] Medical record number: [id number] Date of birth: May 01, 1945 Age: 66 y.o. Gender: male  I saw and evaluated the patient. I reviewed the resident's note by Dr. Gilford Rile and I agree with the resident's findings and plans as documented in his note, with the following additional comments.  The patient remains afebrile on IV fluconazole for candidal endocarditis.  He was out of bed to chair today with the assistance of physical therapy.

## 2011-09-06 NOTE — Progress Notes (Signed)
Physical Therapy Treatment Patient Details Name: [name] MRN: [id number] DOB: 03-Feb-1946 Today's Date: 09/06/2011 Time: 4098-1191 PT Time Calculation (min): 32 min  PT Assessment / Plan / Recommendation Comments on Treatment Session  Pt reports leg "jerking" is new (pt demonstrating asterixis with resisted leg exercises--legs suddently "give way"). ? due to metabolic problem vs medicine side-effect? Pt currently limited by fatigue and leg weakness. Noted pt not yet therapeutic on heparin (for DVT), however has been running >24 hrs and felt the risk of inactivity was greater than the risk of activity, therefore proceeded with PT.    Follow Up Recommendations  Home health PT;Supervision for mobility/OOB    Barriers to Discharge        Equipment Recommendations  None recommended by PT    Recommendations for Other Services OT consult  Frequency Min 3X/week   Plan Discharge plan needs to be updated;Frequency remains appropriate    Precautions / Restrictions Precautions Precautions: Fall   Pertinent Vitals/Pain Denied pain; HR 100-120 with activity; SaO2 93-96% on RA; RR 20-30    Mobility  Bed Mobility Bed Mobility: Rolling Right;Right Sidelying to Sit;Sitting - Scoot to [place] of Bed Rolling Right: 6: Modified independent (Device/Increase time) Right Sidelying to Sit: HOB elevated;4: Min guard Sitting - Scoot to Edge of Bed: 6: Modified independent (Device/Increase time) Details for Bed Mobility Assistance: Pt nearly needed assist to come to sitting (incr effort, time);  Transfers Transfers: Sit to Stand;Stand to Sit Sit to Stand: 4: Min guard;With upper extremity assist;From bed Stand to Sit: 5: Supervision;With upper extremity assist;With armrests;To chair/3-in-1 Details for Transfer Assistance: Pt reported near fall earlier today when standing to use urinal, therefore minguard assist provided for safety; Ambulation/Gait Ambulation/Gait Assistance: 4: Min assist;Other  (comment) (+1 for IV/monitors) Ambulation Distance (Feet): 10 Feet Assistive device: Rolling walker Ambulation/Gait Assistance Details: Cues for safe use of RW (keeping close to body); assist to manage lines; Gait Pattern: Step-through pattern;Decreased stride length    Exercises General Exercises - Lower Extremity Long Arc Quad: AROM;Strengthening;Both;10 reps;Seated;Other (comment) (moderate resistance given) Other Exercises Other Exercises: Pt provided a handout and reviewed ankle pumps, heelslides, briding, SLR, LAQ, seated marching, and chair pushups; pt instructed not to perform standing exercises except with therapy.   PT Diagnosis:    PT Problem List:   PT Treatment Interventions:     PT Goals Acute Rehab PT Goals PT Goal Formulation: With patient Time For Goal Achievement: 09/20/11 Potential to Achieve Goals: Good Pt will go Sit to Stand: with supervision;with upper extremity assist PT Goal: Sit to Stand - Progress: Goal set today Pt will go Stand to Sit: with supervision;with upper extremity assist PT Goal: Stand to Sit - Progress: Goal set today Pt will Transfer Bed to Chair/Chair to Bed: with supervision PT Transfer Goal: Bed to Chair/Chair to Bed - Progress: Goal set today Pt will Stand: 1 - 2 min;with unilateral upper extremity support;with supervision PT Goal: Stand - Progress: Goal set today Pt will Ambulate: 51 - 150 feet;with supervision;with least restrictive assistive device PT Goal: Ambulate - Progress: Goal set today Pt will Perform Home Exercise Program: Independently PT Goal: Perform Home Exercise Program - Progress: Goal set today  Visit Information  Last PT Received On: 09/06/11 Assistance Needed: +1    Subjective Data  Subjective: "Where have you been.Marland KitchenMarland KitchenI've been wanting to get up or exercise these legs!"   Cognition  Overall Cognitive Status: Appears within functional limits for tasks assessed/performed Arousal/Alertness: Awake/alert Orientation  Level: Appears  intact for tasks assessed Behavior During Session: [facility] for tasks performed    Balance     End of Session PT - End of Session Equipment Utilized During Treatment: Gait belt Activity Tolerance: Patient limited by fatigue Patient left: in chair;with call bell/phone within reach;with family/visitor present Nurse Communication: Mobility status;Other (comment) (knee buckling with near fall with wife earlier)    Lemarcus Baggerly 09/06/2011, 4:28 PM

## 2011-09-06 NOTE — Progress Notes (Signed)
ANTICOAGULATION CONSULT NOTE - Follow Up Consult  Pharmacy Consult for heparin Indication: septic emboli to lung  Labs:  Basename 09/06/11 0450 09/06/11 0410 09/05/11 2322 09/05/11 1919 09/05/11 1830 09/05/11 1620 09/05/11 0435 09/04/11 1800 09/04/11 0500  HGB -- 7.4* -- -- -- 7.6* -- -- --  HCT -- 23.8* -- -- -- 24.5* 25.3* -- --  PLT -- 395 -- -- -- 400 424* -- --  APTT -- -- -- -- -- -- -- -- 37  LABPROT -- -- -- -- -- -- -- -- 15.3*  INR -- -- -- -- -- -- -- -- 1.18  HEPARINUNFRC <0.10* -- -- <0.10* -- -- -- -- --  CREATININE -- 0.71 -- -- -- -- 0.82 0.85 --  CKTOTAL -- 8 8 -- 14 -- -- -- --  CKMB -- 1.2 1.2 -- 1.0 -- -- -- --  TROPONINI -- <0.30 <0.30 -- <0.30 -- -- -- --   Assessment: 67yo male remains undetectable on heparin after rate increase.  Goal of Therapy:  Heparin level 0.3-0.5 units/ml   Plan:  Will increase heparin gtt by 3 units/kg/hr to 1300 units/hr and check level in 6hr.  Colleen Can PharmD BCPS 09/06/2011,6:56 AM

## 2011-09-06 NOTE — Progress Notes (Signed)
PROGRESS NOTE  Subjective:   Pt is feeling better.  Had some NSVT and was transferred back to the CCU.  No other VT since then.  He is feeling well.   Objective:    Vital Signs:   Temp:  [98.1 F (36.7 C)-98.9 F (37.2 C)] 98.2 F (36.8 C) (06/07 0400) Pulse Rate:  [76-112] 102  (06/07 0600) Resp:  [20-32] 23  (06/07 0600) BP: (91-129)/(50-69) 113/63 mmHg (06/07 0600) SpO2:  [89 %-100 %] 93 % (06/07 0600)  Last BM Date: 09/02/11   24-hour weight change: Weight change:   Weight trends: Filed Weights   08/30/11 0500 09/01/11 0515 09/03/11 0554  Weight: 181 lb 9.6 oz (82.373 kg) 180 lb 1.9 oz (81.7 kg) 182 lb (82.555 kg)    Intake/Output:  06/06 0701 - 06/07 0700 In: 1083.6 [P.O.:480; I.V.:603.6] Out: 1200 [Urine:1200]     Physical Exam: BP 113/63  Pulse 102  Temp(Src) 98.2 F (36.8 C) (Oral)  Resp 23  Ht 5\' 10"  (1.778 m)  Wt 182 lb (82.555 kg)  BMI 26.11 kg/m2  SpO2 93%  General: Vital signs reviewed and noted. Well-developed, well-nourished, in no acute distress; alert, appropriate and cooperative .  Head: Normocephalic, atraumatic.  Eyes: conjunctivae/corneas clear. PERRL, EOM's intact.   Throat: normal  Neck: Supple. Normal carotids. No JVD  Lungs:  Clear bilaterally to auscultation  Heart: Regular rate,  With normal  S1 S2. No murmurs, gallops or rubs  Abdomen:  Soft, non-tender, non-distended with normoactive bowel sounds. No hepatomegaly. No rebound/guarding. No abdominal masses.  Extremities: Distal pedal pulses are 2+ .  No edema.    Neurologic: A&O X3, CN II - XII are grossly intact. Motor strength is 5/5 in the all 4 extremities.  Psych: Responds to questions appropriately with normal affect.    Labs: BMET:  Basename 09/06/11 0410 09/05/11 0435 09/04/11 1800  NA 133* 128* --  K 4.3 4.3 --  CL 95* 89* --  CO2 29 30 --  GLUCOSE 106* 112* --  BUN 14 15 --  CREATININE 0.71 0.82 --  CALCIUM 8.8 9.3 --  MG -- -- 2.2  PHOS -- -- --     Liver function tests: No results found for this basename: AST:2,ALT:2,ALKPHOS:2,BILITOT:2,PROT:2,ALBUMIN:2 in the last 72 hours No results found for this basename: LIPASE:2,AMYLASE:2 in the last 72 hours  CBC:  Basename 09/06/11 0410 09/05/11 1620  WBC 9.0 11.0*  NEUTROABS -- --  HGB 7.4* 7.6*  HCT 23.8* 24.5*  MCV 80.7 79.8  PLT 395 400    Cardiac Enzymes:  Basename 09/06/11 0410 09/05/11 2322 09/05/11 1830 09/05/11 1335  CKTOTAL 8 8 14 11   CKMB 1.2 1.2 1.0 1.2  TROPONINI <0.30 <0.30 <0.30 <0.30    Coagulation Studies:  Basename 09/04/11 0500  LABPROT 15.3*  INR 1.18    Other:    Tele:  Sinus tachy, LBBB  Medications:    Infusions:    . sodium chloride 10 mL/hr at 09/06/11 0400  . heparin 1,050 Units/hr (09/05/11 2033)    Scheduled Medications:    . aspirin EC  81 mg Oral Daily  . ciprofloxacin  500 mg Oral BID  . docusate sodium  100 mg Oral BID  . feeding supplement  237 mL Oral BID BM  . fluconazole (DIFLUCAN) IV  400 mg Intravenous Q24H  . furosemide  60 mg Oral Daily  . levalbuterol  0.63 mg Nebulization Q6H  . metoprolol  50 mg Oral BID  . pantoprazole  40 mg Oral Q1200  . sodium chloride  3 mL Intravenous Q12H  . traZODone  50 mg Oral QHS  . DISCONTD: albuterol  2 puff Inhalation Q6H  . DISCONTD: calcium carbonate  1 tablet Oral TID WC  . DISCONTD: enoxaparin (LOVENOX) injection  40 mg Subcutaneous Q24H  . DISCONTD: gadobenate dimeglumine  17 mL Intravenous Once  . DISCONTD: multivitamin with minerals  1 tablet Oral Daily  . DISCONTD: vitamin C  250 mg Oral Daily    Assessment/ Plan:    1.  CHF: / LBBB-   Echo showed.  Left ventricle: The cavity size was moderately dilated. Wall thickness was increased in a pattern of mild LVH. The estimated ejection fraction was 30%. Diffuse hypokinesis. Septal bounce. - Aortic valve: There was no stenosis. - Mitral valve: Mild regurgitation. - Left atrium: The atrium was mildly to moderately  dilated. - Right ventricle: The cavity size was normal. Systolic function was normal. - Right atrium: There is a catheter in the right atrium. The ICD has been removed. - Tricuspid valve: Peak RV-RA gradient: 33mm Hg (S). - Systemic veins: IVC was not visualized.  Continue metoprolol. Cont. Lasix.  He had a cough with ACE inhibitor.  He has had hypotension with ARB.  Will try Losartan 25 mg a day.  Will need to have his ICD / BI V pacer replaced as soon as he is clear from an ID standponint  2. 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  I think it will take a long time to clear this.  I do not find any evidence of recurrent cardiac evolvement.    3. DVT:  Agree with heparin.  I'm not sure that he needs an IVC filter if he can stay on anticoagulation.     Disposition: plans per Int. Med team.   Length of Stay: 10  Vesta Mixer, Montez Hageman., MD, [facility] ([place]) 09/06/2011, 7:34 AM Office [phone] Pager [phone]

## 2011-09-06 NOTE — Progress Notes (Signed)
INFECTIOUS DISEASE PROGRESS NOTE  ID: [name] is a 66 y.o. male with   Principal Problem:  *Candidemia Active Problems:  Chronic systolic congestive heart failure  Normocytic anemia  Esophageal stricture  Thrombocytopenia  Septic embolism  Candidal endocarditis  Fever  Pleural effusion  Malnutrition  V-tach  Pulmonary embolism, septic  Subjective: Without complaints  Abtx:  Anti-infectives     Start     Dose/Rate Route Frequency Ordered Stop   09/04/11 2000   ciprofloxacin (CIPRO) tablet 500 mg        500 mg Oral 2 times daily 09/04/11 1410     09/02/11 1400   fluconazole (DIFLUCAN) IVPB 400 mg        400 mg 200 mL/hr over 60 Minutes Intravenous Every 24 hours 09/02/11 1300     09/02/11 1200   vancomycin (VANCOCIN) 1,250 mg in sodium chloride 0.9 % 250 mL IVPB  Status:  Discontinued        1,250 mg 166.7 mL/hr over 90 Minutes Intravenous Every 12 hours 09/02/11 1001 09/03/11 0947   08/31/11 1600   vancomycin (VANCOCIN) IVPB 1000 mg/200 mL premix  Status:  Discontinued        1,000 mg 200 mL/hr over 60 Minutes Intravenous Every 8 hours 08/31/11 1527 09/02/11 1001   08/31/11 1600   piperacillin-tazobactam (ZOSYN) IVPB 3.375 g  Status:  Discontinued        3.375 g 12.5 mL/hr over 240 Minutes Intravenous 3 times per day 08/31/11 1527 09/04/11 1410   08/27/11 2200   micafungin (MYCAMINE) 150 mg in sodium chloride 0.9 % 100 mL IVPB  Status:  Discontinued        150 mg 100 mL/hr over 1 Hours Intravenous Every 24 hours 08/27/11 2031 09/02/11 1300          Medications:  Scheduled:   . aspirin EC  81 mg Oral Daily  . ciprofloxacin  500 mg Oral BID  . docusate sodium  100 mg Oral BID  . feeding supplement  237 mL Oral BID BM  . fluconazole (DIFLUCAN) IV  400 mg Intravenous Q24H  . furosemide  60 mg Oral Daily  . levalbuterol  0.63 mg Nebulization Q6H  . metoprolol  50 mg Oral BID  . pantoprazole  40 mg Oral Q1200  . sodium chloride  3 mL Intravenous  Q12H  . traZODone  50 mg Oral QHS    Objective: Vital signs in last 24 hours: Temp:  [98.1 F (36.7 C)-98.9 F (37.2 C)] 98.9 F (37.2 C) (06/07 1200) Pulse Rate:  [79-115] 92  (06/07 1200) Resp:  [19-32] 21  (06/07 1200) BP: (91-129)/(50-68) 110/67 mmHg (06/07 1200) SpO2:  [93 %-100 %] 96 % (06/07 1200) FiO2 (%):  [28 %] 28 % (06/07 0854)   General appearance: alert, cooperative and mild distress Resp: clear to auscultation bilaterally Cardio: regular rate and rhythm GI: normal findings: bowel sounds normal and soft, non-tender  Lab Results  Basename 09/06/11 0410 09/05/11 1620 09/05/11 0435  WBC 9.0 11.0* --  HGB 7.4* 7.6* --  HCT 23.8* 24.5* --  NA 133* -- 128*  K 4.3 -- 4.3  CL 95* -- 89*  CO2 29 -- 30  BUN 14 -- 15  CREATININE 0.71 -- 0.82  GLU -- -- --   Liver Panel No results found for this basename: PROT:2,ALBUMIN:2,AST:2,ALT:2,ALKPHOS:2,BILITOT:2,BILIDIR:2,IBILI:2 in the last 72 hours Sedimentation Rate No results found for this basename: ESRSEDRATE in the last 72 hours C-Reactive Protein No results  found for this basename: CRP:2 in the last 72 hours  Microbiology: Recent Results (from the past 240 hour(s))  MRSA PCR SCREENING     Status: Normal   Collection Time   08/27/11  5:41 PM      Component Value Range Status Comment   MRSA by PCR NEGATIVE  NEGATIVE  Final   CULTURE, BLOOD (ROUTINE X 2)     Status: Normal   Collection Time   08/27/11  9:53 PM      Component Value Range Status Comment   Specimen Description BLOOD LEFT FOOT   Final    Special Requests BOTTLES DRAWN AEROBIC AND ANAEROBIC 5CC   Final    Culture  Setup Time [id number]   Final    Culture NO GROWTH 5 DAYS   Final    Report Status 09/03/2011 FINAL   Final   CULTURE, BLOOD (ROUTINE X 2)     Status: Normal   Collection Time   08/27/11  9:59 PM      Component Value Range Status Comment   Specimen Description BLOOD RIGHT FOOT   Final    Special Requests BOTTLES DRAWN AEROBIC AND  ANAEROBIC 5CC   Final    Culture  Setup Time [id number]   Final    Culture NO GROWTH 5 DAYS   Final    Report Status 09/03/2011 FINAL   Final   BODY FLUID CULTURE     Status: Normal   Collection Time   08/28/11  3:54 PM      Component Value Range Status Comment   Specimen Description PLEURAL FLUID RIGHT   Final    Special Requests SYR   Final    Gram Stain     Final    Value: RARE WBC PRESENT,BOTH PMN AND MONONUCLEAR     NO ORGANISMS SEEN   Culture NO GROWTH 3 DAYS   Final    Report Status 09/01/2011 FINAL   Final   FUNGUS CULTURE W SMEAR     Status: Normal (Preliminary result)   Collection Time   08/28/11  3:55 PM      Component Value Range Status Comment   Specimen Description PLEURAL FLUID RIGHT   Final    Special Requests SYR   Final    Fungal Smear NO YEAST OR FUNGAL ELEMENTS SEEN   Final    Culture CULTURE IN PROGRESS FOR FOUR WEEKS   Final    Report Status PENDING   Incomplete   ANAEROBIC CULTURE     Status: Normal   Collection Time   08/28/11  3:55 PM      Component Value Range Status Comment   Specimen Description PLEURAL FLUID RIGHT   Final    Special Requests SYR   Final    Gram Stain     Final    Value: RARE WBC PRESENT,BOTH PMN AND MONONUCLEAR     NO ORGANISMS SEEN   Culture NO ANAEROBES ISOLATED   Final    Report Status 09/02/2011 FINAL   Final   AFB CULTURE WITH SMEAR     Status: Normal (Preliminary result)   Collection Time   08/28/11  3:56 PM      Component Value Range Status Comment   Specimen Description PLEURAL FLUID RIGHT   Final    Special Requests SYR   Final    ACID FAST SMEAR NO ACID FAST BACILLI SEEN   Final    Culture     Final  Value: CULTURE WILL BE EXAMINED FOR 6 WEEKS BEFORE ISSUING A FINAL REPORT   Report Status PENDING   Incomplete   URINE CULTURE     Status: Normal   Collection Time   08/31/11  3:57 PM      Component Value Range Status Comment   Specimen Description URINE, RANDOM   Final    Special Requests NONE    Final    Culture  Setup Time [id number]   Final    Colony Count 85,000 COLONIES/ML   Final    Culture KLEBSIELLA PNEUMONIAE   Final    Report Status 09/03/2011 FINAL   Final    Organism ID, Bacteria KLEBSIELLA PNEUMONIAE   Final   CULTURE, BLOOD (ROUTINE X 2)     Status: Normal   Collection Time   08/31/11  4:07 PM      Component Value Range Status Comment   Specimen Description BLOOD RIGHT HAND   Final    Special Requests BOTTLES DRAWN AEROBIC AND ANAEROBIC 8CC   Final    Culture  Setup Time [id number]   Final    Culture NO GROWTH 5 DAYS   Final    Report Status 09/06/2011 FINAL   Final   CULTURE, BLOOD (ROUTINE X 2)     Status: Normal   Collection Time   08/31/11  4:25 PM      Component Value Range Status Comment   Specimen Description BLOOD RIGHT FOOT   Final    Special Requests BOTTLES DRAWN AEROBIC AND ANAEROBIC 10CC   Final    Culture  Setup Time [id number]   Final    Culture NO GROWTH 5 DAYS   Final    Report Status 09/06/2011 FINAL   Final   MRSA PCR SCREENING     Status: Normal   Collection Time   09/04/11  8:30 PM      Component Value Range Status Comment   MRSA by PCR NEGATIVE  NEGATIVE  Final     Studies/Results: Mr Shirlee Latch Wo Contrast  09/05/2011  *RADIOLOGY REPORT*  Clinical Data:  History of infected cardiac device removed 1 month ago.  Question of infarcts / septic emboli or mycotic aneurysm.  MRI HEAD WITH AND WITHOUT CONTRAST MRA HEAD WITHOUT CONTRAST  Technique: Multiplanar, multiecho pulse sequences of the brain and surrounding structures were obtained according to standard protocol with and without intravenous contrast.  Angiographic images of the Circle of Willis were obtained using MRA technique without intravenous contrast.  Contrast: 17 ml MultiHance.  Comparisonnone.  MRI HEAD  Findings: Motion degraded exam.  No acute infarct.  No intracranial hemorrhage.  Small remote right corona radiata infarct.  Mild nonspecific white matter type changes may reflect  result of small vessel disease.  No intracranial mass or abnormal enhancement.  Heterogeneous bone marrow may reflect result of anemia.  Partial opacification inferior aspect of the left mastoid air cells without obstructing lesions seen in the region of the posterior- superior nasopharynx contributing to left sided eustachian tube dysfunction.  Minimal to mild paranasal sinus mucosal thickening.  IMPRESSION: Motion degraded exam.  No acute infarct.  Remote small infarct posterior right corona radiata with scattered nonspecific white matter type changes which may reflect result of small vessel disease.  Mild global atrophy without hydrocephalus.  No abnormal intracranial enhancing lesion/mass.  Heterogeneous bone marrow may reflect result of underlying anemia.  Left mastoid air cell partial opacification and minimal to mild paranasal sinus mucosal thickening  MRA HEAD  Findings: Small bulge superior margin proximal A1 segment left anterior cerebral artery.  This is not a typical position for a saccular aneurysm or mycotic aneurysm.  Small vessel may arise from this structure.  Stability can be confirmed on follow-up.  Middle cerebral artery and A2 segment anterior cerebral artery branch vessel irregularity without discrete aneurysm.  Anterior circulation without medium or large size vessel significant stenosis or occlusion.  Full extent of the left PICA not included on present examination. Nonvisualization right PICA with large right AICA. Nonvisualization left AICA.  Mild narrowing distal right vertebral artery.  No high-grade stenosis of the basilar artery.  Superior cerebellar artery and posterior cerebral artery mild branch vessel irregularity without discrete aneurysm.  IMPRESSION: Small bulge superior margin proximal A1 segment left anterior cerebral artery.  This is not a typical position for a saccular aneurysm or mycotic aneurysm.  Small vessel may arise from this structure.  Stability can be confirmed on  follow-up.  Please see above discussion.  Original Report Authenticated By: Fuller Canada, M.D.   Mr Laqueta Jean Wo Contrast  09/05/2011  *RADIOLOGY REPORT*  Clinical Data:  History of infected cardiac device removed 1 month ago.  Question of infarcts / septic emboli or mycotic aneurysm.  MRI HEAD WITH AND WITHOUT CONTRAST MRA HEAD WITHOUT CONTRAST  Technique: Multiplanar, multiecho pulse sequences of the brain and surrounding structures were obtained according to standard protocol with and without intravenous contrast.  Angiographic images of the Circle of Willis were obtained using MRA technique without intravenous contrast.  Contrast: 17 ml MultiHance.  Comparisonnone.  MRI HEAD  Findings: Motion degraded exam.  No acute infarct.  No intracranial hemorrhage.  Small remote right corona radiata infarct.  Mild nonspecific white matter type changes may reflect result of small vessel disease.  No intracranial mass or abnormal enhancement.  Heterogeneous bone marrow may reflect result of anemia.  Partial opacification inferior aspect of the left mastoid air cells without obstructing lesions seen in the region of the posterior- superior nasopharynx contributing to left sided eustachian tube dysfunction.  Minimal to mild paranasal sinus mucosal thickening.  IMPRESSION: Motion degraded exam.  No acute infarct.  Remote small infarct posterior right corona radiata with scattered nonspecific white matter type changes which may reflect result of small vessel disease.  Mild global atrophy without hydrocephalus.  No abnormal intracranial enhancing lesion/mass.  Heterogeneous bone marrow may reflect result of underlying anemia.  Left mastoid air cell partial opacification and minimal to mild paranasal sinus mucosal thickening  MRA HEAD  Findings: Small bulge superior margin proximal A1 segment left anterior cerebral artery.  This is not a typical position for a saccular aneurysm or mycotic aneurysm.  Small vessel may arise from  this structure.  Stability can be confirmed on follow-up.  Middle cerebral artery and A2 segment anterior cerebral artery branch vessel irregularity without discrete aneurysm.  Anterior circulation without medium or large size vessel significant stenosis or occlusion.  Full extent of the left PICA not included on present examination. Nonvisualization right PICA with large right AICA. Nonvisualization left AICA.  Mild narrowing distal right vertebral artery.  No high-grade stenosis of the basilar artery.  Superior cerebellar artery and posterior cerebral artery mild branch vessel irregularity without discrete aneurysm.  IMPRESSION: Small bulge superior margin proximal A1 segment left anterior cerebral artery.  This is not a typical position for a saccular aneurysm or mycotic aneurysm.  Small vessel may arise from this structure.  Stability can be confirmed on follow-up.  Please see above discussion.  Original Report Authenticated By: Fuller Canada, M.D.   Dg Chest Port 1 View  09/06/2011  *RADIOLOGY REPORT*  Clinical Data: Evaluate PICC line placement  PORTABLE CHEST - 1 VIEW  Comparison: 09/01/2011  Findings:  Right arm PICC line tip is in the SVC.  There is mild cardiac enlargement and mild interstitial edema. Partially loculated right pleural effusion is again noted.  Airspace opacities noted within the left midlung, similar to previous exam.  IMPRESSION:  1.  No change and pulmonary edema and pleural effusion. 2.  Persistent left midlung airspace opacity.  Original Report Authenticated By: Rosealee Albee, M.D.     Assessment/Plan: Pulmonary Infarct/septic emboli  Candidemia  K pneumo UTI (6-1)  Prev Pacer Pocket site inflamation  DVT RLE  Would-  Continue diflucan (Day 24 antifungal therapy)  Day 6 anbx (zosyn ---> cipro)  Now on heparin.  Control VT  Consider stop cipro at day 10  My great appreciation to the B SVC.  He continues to be tenuous....   [name] Infectious  Diseases [phone] 09/06/2011, 1:08 PM   LOS: 10 days

## 2011-09-06 NOTE — Progress Notes (Signed)
Medical Student Daily Progress Note  Subjective: Patient is still rather tired from yesterday. He slept okay last night and did not have any new episodes of VT. He does still continue to have tachycardia and tachypnea throughout the day and night. He reports that his cough has been improving and he feels it "loosening up." He has been drowsy throughout the day today which he attributes to the increased clonazepam dose. Otherwise, he denies any fevers, chills, night sweats, N/V/D. He has also not had a BM in about 4 days now. He is resting in bed upon exam with his wife bedside.  Objective: Vital signs in last 24 hours: Filed Vitals:   09/06/11 1000 09/06/11 1100 09/06/11 1200 09/06/11 1441  BP: 117/64 118/63 110/67   Pulse: 115 95 92   Temp:   98.9 F (37.2 C)   TempSrc:      Resp: 30 24 21    Height:      Weight:      SpO2: 98% 94% 96% 97%   Weight change:   Intake/Output Summary (Last 24 hours) at 09/06/11 1545 Last data filed at 09/06/11 0700  Gross per 24 hour  Intake 446.63 ml  Output    575 ml  Net -128.37 ml   Physical Exam: BP 112/69  Pulse 105  Temp(Src) 98.9 F (37.2 C) (Oral)  Resp 30  Ht 5\' 10"  (1.778 m)  Wt 82.555 kg (182 lb)  BMI 26.11 kg/m2  SpO2 94% General appearance: cooperative, fatigued and no distress Head: Normocephalic, without obvious abnormality, atraumatic Neck: no adenopathy, no carotid bruit, no JVD, supple, symmetrical, trachea midline and thyroid not enlarged, symmetric, no tenderness/mass/nodules Back: symmetric, no curvature. ROM normal. No CVA tenderness. Lungs: diminished breath sounds RLL and rales RML and "dry" Chest wall: no tenderness Heart: regular rate and rhythm, S1, S2 normal, no murmur, click, rub or gallop Abdomen: soft, non-tender; bowel sounds normal; no masses,  no organomegaly Extremities: extremities normal, atraumatic, no cyanosis or edema Skin: Skin color, texture, turgor normal. No rashes or lesions Lab Results: Basic  Metabolic Panel:  Lab 09/06/11 1914 09/05/11 0435 09/04/11 1800  NA 133* 128* --  K 4.3 4.3 --  CL 95* 89* --  CO2 29 30 --  GLUCOSE 106* 112* --  BUN 14 15 --  CREATININE 0.71 0.82 --  CALCIUM 8.8 9.3 --  MG -- -- 2.2  PHOS -- -- --   Liver Function Tests:  Lab 08/31/11 1631  AST 36  ALT 21  ALKPHOS 155*  BILITOT 0.2*  PROT 7.8  ALBUMIN 2.0*   No results found for this basename: LIPASE:2,AMYLASE:2 in the last 168 hours No results found for this basename: AMMONIA:2 in the last 168 hours CBC:  Lab 09/06/11 0410 09/05/11 1620 09/02/11 0437 08/31/11 1631  WBC 9.0 11.0* -- --  NEUTROABS -- -- 7.3 6.5  HGB 7.4* 7.6* -- --  HCT 23.8* 24.5* -- --  MCV 80.7 79.8 -- --  PLT 395 400 -- --   Cardiac Enzymes:  Lab 09/06/11 0410 09/05/11 2322 09/05/11 1830  CKTOTAL 8 8 14   CKMB 1.2 1.2 1.0  CKMBINDEX -- -- --  TROPONINI <0.30 <0.30 <0.30   BNP:  Lab 09/01/11 0500  PROBNP 973.1*   D-Dimer: No results found for this basename: DDIMER:2 in the last 168 hours CBG: No results found for this basename: GLUCAP:6 in the last 168 hours Hemoglobin A1C: No results found for this basename: HGBA1C in the last 168 hours Fasting  Lipid Panel: No results found for this basename: CHOL,HDL,LDLCALC,TRIG,CHOLHDL,LDLDIRECT in the last 119 hours Thyroid Function Tests: No results found for this basename: TSH,T4TOTAL,FREET4,T3FREE,THYROIDAB in the last 168 hours Coagulation:  Lab 09/04/11 0500  LABPROT 15.3*  INR 1.18   Anemia Panel: No results found for this basename: VITAMINB12,FOLATE,FERRITIN,TIBC,IRON,RETICCTPCT in the last 168 hours Urine Drug Screen: Drugs of Abuse  No results found for this basename: labopia, cocainscrnur, labbenz, amphetmu, thcu, labbarb    Alcohol Level: No results found for this basename: ETH:2 in the last 168 hours Urinalysis:  Lab 08/31/11 1557  COLORURINE YELLOW  LABSPEC 1.020  PHURINE 7.0  GLUCOSEU NEGATIVE  HGBUR NEGATIVE  BILIRUBINUR NEGATIVE   KETONESUR NEGATIVE  PROTEINUR NEGATIVE  UROBILINOGEN 1.0  NITRITE NEGATIVE  LEUKOCYTESUR NEGATIVE    Micro Results: Recent Results (from the past 240 hour(s))  MRSA PCR SCREENING     Status: Normal   Collection Time   08/27/11  5:41 PM      Component Value Range Status Comment   MRSA by PCR NEGATIVE  NEGATIVE  Final   CULTURE, BLOOD (ROUTINE X 2)     Status: Normal   Collection Time   08/27/11  9:53 PM      Component Value Range Status Comment   Specimen Description BLOOD LEFT FOOT   Final    Special Requests BOTTLES DRAWN AEROBIC AND ANAEROBIC 5CC   Final    Culture  Setup Time [id number]   Final    Culture NO GROWTH 5 DAYS   Final    Report Status 09/03/2011 FINAL   Final   CULTURE, BLOOD (ROUTINE X 2)     Status: Normal   Collection Time   08/27/11  9:59 PM      Component Value Range Status Comment   Specimen Description BLOOD RIGHT FOOT   Final    Special Requests BOTTLES DRAWN AEROBIC AND ANAEROBIC 5CC   Final    Culture  Setup Time [id number]   Final    Culture NO GROWTH 5 DAYS   Final    Report Status 09/03/2011 FINAL   Final   BODY FLUID CULTURE     Status: Normal   Collection Time   08/28/11  3:54 PM      Component Value Range Status Comment   Specimen Description PLEURAL FLUID RIGHT   Final    Special Requests SYR   Final    Gram Stain     Final    Value: RARE WBC PRESENT,BOTH PMN AND MONONUCLEAR     NO ORGANISMS SEEN   Culture NO GROWTH 3 DAYS   Final    Report Status 09/01/2011 FINAL   Final   FUNGUS CULTURE W SMEAR     Status: Normal (Preliminary result)   Collection Time   08/28/11  3:55 PM      Component Value Range Status Comment   Specimen Description PLEURAL FLUID RIGHT   Final    Special Requests SYR   Final    Fungal Smear NO YEAST OR FUNGAL ELEMENTS SEEN   Final    Culture CULTURE IN PROGRESS FOR FOUR WEEKS   Final    Report Status PENDING   Incomplete   ANAEROBIC CULTURE     Status: Normal   Collection Time   08/28/11  3:55 PM       Component Value Range Status Comment   Specimen Description PLEURAL FLUID RIGHT   Final    Special Requests SYR   Final  Gram Stain     Final    Value: RARE WBC PRESENT,BOTH PMN AND MONONUCLEAR     NO ORGANISMS SEEN   Culture NO ANAEROBES ISOLATED   Final    Report Status 09/02/2011 FINAL   Final   AFB CULTURE WITH SMEAR     Status: Normal (Preliminary result)   Collection Time   08/28/11  3:56 PM      Component Value Range Status Comment   Specimen Description PLEURAL FLUID RIGHT   Final    Special Requests SYR   Final    ACID FAST SMEAR NO ACID FAST BACILLI SEEN   Final    Culture     Final    Value: CULTURE WILL BE EXAMINED FOR 6 WEEKS BEFORE ISSUING A FINAL REPORT   Report Status PENDING   Incomplete   URINE CULTURE     Status: Normal   Collection Time   08/31/11  3:57 PM      Component Value Range Status Comment   Specimen Description URINE, RANDOM   Final    Special Requests NONE   Final    Culture  Setup Time [id number]   Final    Colony Count 85,000 COLONIES/ML   Final    Culture KLEBSIELLA PNEUMONIAE   Final    Report Status 09/03/2011 FINAL   Final    Organism ID, Bacteria KLEBSIELLA PNEUMONIAE   Final   CULTURE, BLOOD (ROUTINE X 2)     Status: Normal   Collection Time   08/31/11  4:07 PM      Component Value Range Status Comment   Specimen Description BLOOD RIGHT HAND   Final    Special Requests BOTTLES DRAWN AEROBIC AND ANAEROBIC 8CC   Final    Culture  Setup Time [id number]   Final    Culture NO GROWTH 5 DAYS   Final    Report Status 09/06/2011 FINAL   Final   CULTURE, BLOOD (ROUTINE X 2)     Status: Normal   Collection Time   08/31/11  4:25 PM      Component Value Range Status Comment   Specimen Description BLOOD RIGHT FOOT   Final    Special Requests BOTTLES DRAWN AEROBIC AND ANAEROBIC 10CC   Final    Culture  Setup Time [id number]   Final    Culture NO GROWTH 5 DAYS   Final    Report Status 09/06/2011 FINAL   Final   MRSA PCR SCREENING      Status: Normal   Collection Time   09/04/11  8:30 PM      Component Value Range Status Comment   MRSA by PCR NEGATIVE  NEGATIVE  Final    Studies/Results: Mr Shirlee Latch Wo Contrast  09/05/2011  *RADIOLOGY REPORT*  Clinical Data:  History of infected cardiac device removed 1 month ago.  Question of infarcts / septic emboli or mycotic aneurysm.  MRI HEAD WITH AND WITHOUT CONTRAST MRA HEAD WITHOUT CONTRAST  Technique: Multiplanar, multiecho pulse sequences of the brain and surrounding structures were obtained according to standard protocol with and without intravenous contrast.  Angiographic images of the Circle of Willis were obtained using MRA technique without intravenous contrast.  Contrast: 17 ml MultiHance.  Comparisonnone.  MRI HEAD  Findings: Motion degraded exam.  No acute infarct.  No intracranial hemorrhage.  Small remote right corona radiata infarct.  Mild nonspecific white matter type changes may reflect result of small vessel disease.  No intracranial mass or abnormal  enhancement.  Heterogeneous bone marrow may reflect result of anemia.  Partial opacification inferior aspect of the left mastoid air cells without obstructing lesions seen in the region of the posterior- superior nasopharynx contributing to left sided eustachian tube dysfunction.  Minimal to mild paranasal sinus mucosal thickening.  IMPRESSION: Motion degraded exam.  No acute infarct.  Remote small infarct posterior right corona radiata with scattered nonspecific white matter type changes which may reflect result of small vessel disease.  Mild global atrophy without hydrocephalus.  No abnormal intracranial enhancing lesion/mass.  Heterogeneous bone marrow may reflect result of underlying anemia.  Left mastoid air cell partial opacification and minimal to mild paranasal sinus mucosal thickening  MRA HEAD  Findings: Small bulge superior margin proximal A1 segment left anterior cerebral artery.  This is not a typical position for a saccular  aneurysm or mycotic aneurysm.  Small vessel may arise from this structure.  Stability can be confirmed on follow-up.  Middle cerebral artery and A2 segment anterior cerebral artery branch vessel irregularity without discrete aneurysm.  Anterior circulation without medium or large size vessel significant stenosis or occlusion.  Full extent of the left PICA not included on present examination. Nonvisualization right PICA with large right AICA. Nonvisualization left AICA.  Mild narrowing distal right vertebral artery.  No high-grade stenosis of the basilar artery.  Superior cerebellar artery and posterior cerebral artery mild branch vessel irregularity without discrete aneurysm.  IMPRESSION: Small bulge superior margin proximal A1 segment left anterior cerebral artery.  This is not a typical position for a saccular aneurysm or mycotic aneurysm.  Small vessel may arise from this structure.  Stability can be confirmed on follow-up.  Please see above discussion.  Original Report Authenticated By: Fuller Canada, M.D.   Mr Laqueta Jean Wo Contrast  09/05/2011  *RADIOLOGY REPORT*  Clinical Data:  History of infected cardiac device removed 1 month ago.  Question of infarcts / septic emboli or mycotic aneurysm.  MRI HEAD WITH AND WITHOUT CONTRAST MRA HEAD WITHOUT CONTRAST  Technique: Multiplanar, multiecho pulse sequences of the brain and surrounding structures were obtained according to standard protocol with and without intravenous contrast.  Angiographic images of the Circle of Willis were obtained using MRA technique without intravenous contrast.  Contrast: 17 ml MultiHance.  Comparisonnone.  MRI HEAD  Findings: Motion degraded exam.  No acute infarct.  No intracranial hemorrhage.  Small remote right corona radiata infarct.  Mild nonspecific white matter type changes may reflect result of small vessel disease.  No intracranial mass or abnormal enhancement.  Heterogeneous bone marrow may reflect result of anemia.  Partial  opacification inferior aspect of the left mastoid air cells without obstructing lesions seen in the region of the posterior- superior nasopharynx contributing to left sided eustachian tube dysfunction.  Minimal to mild paranasal sinus mucosal thickening.  IMPRESSION: Motion degraded exam.  No acute infarct.  Remote small infarct posterior right corona radiata with scattered nonspecific white matter type changes which may reflect result of small vessel disease.  Mild global atrophy without hydrocephalus.  No abnormal intracranial enhancing lesion/mass.  Heterogeneous bone marrow may reflect result of underlying anemia.  Left mastoid air cell partial opacification and minimal to mild paranasal sinus mucosal thickening  MRA HEAD  Findings: Small bulge superior margin proximal A1 segment left anterior cerebral artery.  This is not a typical position for a saccular aneurysm or mycotic aneurysm.  Small vessel may arise from this structure.  Stability can be confirmed on follow-up.  Middle  cerebral artery and A2 segment anterior cerebral artery branch vessel irregularity without discrete aneurysm.  Anterior circulation without medium or large size vessel significant stenosis or occlusion.  Full extent of the left PICA not included on present examination. Nonvisualization right PICA with large right AICA. Nonvisualization left AICA.  Mild narrowing distal right vertebral artery.  No high-grade stenosis of the basilar artery.  Superior cerebellar artery and posterior cerebral artery mild branch vessel irregularity without discrete aneurysm.  IMPRESSION: Small bulge superior margin proximal A1 segment left anterior cerebral artery.  This is not a typical position for a saccular aneurysm or mycotic aneurysm.  Small vessel may arise from this structure.  Stability can be confirmed on follow-up.  Please see above discussion.  Original Report Authenticated By: Fuller Canada, M.D.   Dg Chest Port 1 View  09/06/2011  *RADIOLOGY  REPORT*  Clinical Data: Evaluate PICC line placement  PORTABLE CHEST - 1 VIEW  Comparison: 09/01/2011  Findings:  Right arm PICC line tip is in the SVC.  There is mild cardiac enlargement and mild interstitial edema. Partially loculated right pleural effusion is again noted.  Airspace opacities noted within the left midlung, similar to previous exam.  IMPRESSION:  1.  No change and pulmonary edema and pleural effusion. 2.  Persistent left midlung airspace opacity.  Original Report Authenticated By: Rosealee Albee, M.D.   Medications:  I have reviewed the patient's current medications. Prior to Admission:  Prescriptions prior to admission  Medication Sig Dispense Refill  . acetaminophen (TYLENOL) 325 MG tablet Take 325-650 mg by mouth every 4 (four) hours as needed. For pain/fever      . Ascorbic Acid (VITAMIN C) 100 MG tablet Take 100 mg by mouth daily.      [name] aspirin EC 81 MG EC tablet Take 1 tablet (81 mg total) by mouth daily.      . calcium carbonate (TUMS - DOSED IN MG ELEMENTAL CALCIUM) 500 MG chewable tablet Chew 1 tablet by mouth 3 (three) times daily.      . clonazePAM (KLONOPIN) 0.5 MG tablet Take 1 tablet (0.5 mg total) by mouth 2 (two) times daily as needed for anxiety.  30 tablet  1  . esomeprazole (NEXIUM) 40 MG capsule Take 40 mg by mouth 2 (two) times daily.       . famotidine (PEPCID) 10 MG tablet Take 10 mg by mouth 2 (two) times daily.      . feeding supplement (ENSURE COMPLETE) LIQD Take 237 mLs by mouth 2 (two) times daily between meals.      . Flaxseed, Linseed, 1000 MG CAPS Take 1 capsule (1,000 mg total) by mouth daily.      . furosemide (LASIX) 40 MG tablet Take 1 tablet (40 mg total) by mouth daily.  30 tablet  3  . metoprolol (LOPRESSOR) 50 MG tablet Take 0.5 tablets (25 mg total) by mouth 2 (two) times daily.  30 tablet  1  . Multiple Vitamins-Minerals (MULTIVITAMIN) tablet Take 1 tablet by mouth daily with breakfast.   30 tablet    . oxyCODONE-acetaminophen  (PERCOCET) 5-325 MG per tablet Take 1 tablet by mouth every 6 (six) hours as needed for pain. Do not take more than 4000 mg of acetaminophen in 24 hours. Both Tylenol and Percocet contain acetaminophen.  32 tablet  0  . sodium chloride 0.9 % SOLN 100 mL with micafungin 50 MG SOLR 150 mg Inject 150 mg into the vein daily. Dispense quantity sufficient to treat  until on 09/26/2011. Patient is to receive Micafungin via PICC line daily. Via home health agency. PROTECT FROM LIGHT - DO NOT SHAKE  34 application  0  . DISCONTD: acetaminophen (TYLENOL) 325 MG tablet Take 1-2 tablets (325-650 mg total) by mouth every 4 (four) hours as needed.      . polyethylene glycol (MIRALAX / GLYCOLAX) packet Take 17 g by mouth daily as needed.  14 each  11   Anti-infectives     Start     Dose/Rate Route Frequency Ordered Stop   09/04/11 2000   ciprofloxacin (CIPRO) tablet 500 mg        500 mg Oral 2 times daily 09/04/11 1410     09/02/11 1400   fluconazole (DIFLUCAN) IVPB 400 mg        400 mg 200 mL/hr over 60 Minutes Intravenous Every 24 hours 09/02/11 1300     09/02/11 1200   vancomycin (VANCOCIN) 1,250 mg in sodium chloride 0.9 % 250 mL IVPB  Status:  Discontinued        1,250 mg 166.7 mL/hr over 90 Minutes Intravenous Every 12 hours 09/02/11 1001 09/03/11 0947   08/31/11 1600   vancomycin (VANCOCIN) IVPB 1000 mg/200 mL premix  Status:  Discontinued        1,000 mg 200 mL/hr over 60 Minutes Intravenous Every 8 hours 08/31/11 1527 09/02/11 1001   08/31/11 1600   piperacillin-tazobactam (ZOSYN) IVPB 3.375 g  Status:  Discontinued        3.375 g 12.5 mL/hr over 240 Minutes Intravenous 3 times per day 08/31/11 1527 09/04/11 1410   08/27/11 2200   micafungin (MYCAMINE) 150 mg in sodium chloride 0.9 % 100 mL IVPB  Status:  Discontinued        150 mg 100 mL/hr over 1 Hours Intravenous Every 24 hours 08/27/11 2031 09/02/11 1300         Scheduled Meds:   . aspirin EC  81 mg Oral Daily  . ciprofloxacin  500  mg Oral BID  . docusate sodium  100 mg Oral BID  . feeding supplement  237 mL Oral BID BM  . fluconazole (DIFLUCAN) IV  400 mg Intravenous Q24H  . furosemide  60 mg Oral Daily  . levalbuterol  0.63 mg Nebulization Q6H  . metoprolol  50 mg Oral BID  . pantoprazole  40 mg Oral Q1200  . sodium chloride  3 mL Intravenous Q12H  . traZODone  50 mg Oral QHS   Continuous Infusions:   . sodium chloride 10 mL/hr at 09/06/11 0400  . heparin 1,600 Units/hr (09/06/11 1400)   PRN Meds:.acetaminophen, clonazePAM, ipratropium, levalbuterol, oxyCODONE-acetaminophen, polyethylene glycol, sodium chloride, DISCONTD: clonazePAM Assessment/Plan: Principal Problem:  *Candidemia Active Problems:  Chronic systolic congestive heart failure  Normocytic anemia  Esophageal stricture  Thrombocytopenia  Septic embolism  Candidal endocarditis  Fever  Pleural effusion  Malnutrition  V-tach  Pulmonary embolism, septic  Pleural effusion / pulmonary embolism / pulmonary infarction - Patient is s/p thoracentesis (08/28/11) for R effusion. Most recent CT chest (09/01/11) shows unchanged central right PA Candidal embolus (extension to RML, RLL), and embolus/occlusion of LUL pulmonary artery branches. Moderate right pleural effusion present (possibly loculated). Wedge shaped infarcts noted in RML and LUL.  - continue percocet 5/325 q6prn and tylenol 325-650mg  prn for pain  - continue albuterol 2 puffs QID for added bronchodilation  - continue atrovent q4prn / xopenex q4prn nebs  - continue lasix 60 mg daily  - Pleural fluid AFB cx,  fungal stain and cx, anerobic culture: NTD   Acute on chronic systolic CHF 2/2 CAD and ischemic cardiomyopathy - 2D echo (08/23/11) shows EF = 30% with diffuse hypokinesis, mild LVH, and moderately dilated LA. 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  - Appreciate cardiology consult in the management of this patient  - continue ASA 81 mg daily  - continue  metoprolol 50 BID  - continue lasix 60 daily--foley removed on 09/02/11   Questionable HAP - Patient spiked fever and had relative leukocytosis on 08/31/11 with RLL consolidation seen on CXR as well. Concerning for pneumonia and/or small effusion.  - Appreciate ID consult in the management of this patient - d/c vanc (08/31/11>>09/03/11)  - d/c zosyn (08/31/11>>09/04/11) - blood cultures x 2 : NTD   Bacteriuria - Patient's urine culture revealed Klebsiella pneumoniae. 85,000 col/mL. - d/c zosyn (08/31/11>>09/04/11)  - continue cipro for remainder of course (09/04/11>>09/09/11) --for total of 10 days including zosyn   Fever - Patient had Candidal embolus (from atrial lead on former ICD) to R pulmonary artery on 08/15/11. Fevers likely due to gradual pieces of the mass breaking off, with appropriate immune response causing him to have fevers every couple of days. Question of his old pacer pocket having an abscess/infection low possibility at this point but has been considered (no warmth, erythema, tenderness, disproportionate swelling).  - Appreciate ID recommendations and consult in the management of this patient  - blood cultures x 2 pending: NTD  - change micafungin (08/15/11>>09/01/11) to IV fluconazole 400 mg daily to finish 6 week course (08/15/11>>)  - Candida sensitivities are back from reference lab: sensitive to both micafungin and fluconazole   Distal Femoral Vein thrombosis - Patient had LE doppler on 09/03/11 that showed DVT in right femoral vein distally. He has been on Lovenox since 08/29/11 and was put on SCD's on admission 08/27/11 prior to beginning Lovenox. Although LE doppler revealed DVT, question of how long patient may have had the clot remains. He was discharged 5/24 and readmitted 5/28 with minimal activity while home. While inpatient last admit and this admission he has received Lovenox for DVT ppx. After thorough consultation, best course of action was to obtain MRI/MRA brain and if no mycotic  aneurysms or septic emboli seen, then to begin anticoagulation. If present, then IVC filter is the only feasible option left.  - MRI/MRA head on 09/04/11: see read. Notable small bulge proximal A1 segment of left ACA (not a typical place for mycotic aneurysm however)  - Lovenox ppx discontinued on 09/03/11 - heparin per pharmacy, no bolus, with target lower end of therapeutic range  - if patient does not bleed with heparin he would be a good candidate for transition to Coumadin for appropriate length of time 2/2 DVT, if he does begin to bleed with heparin, he will require reversal and imminent IVC filter  - no s/s of bleeding seen  GERD / Esophageal Candidiasis - Stable. He does note some minor discomfort with swallowing, however he also states that the dilation on 08/13/11 was not a complete dilation that he is used to.  - continue fluconazole  - continue Protonix 40mg  daily   Tachycardia - Patient continues to have episodes of tachycardia (90's -110's). SOB, Stress response, and anxiety contributing components of this. His pain management (left sided and due to his infarcted lung) is well under control with his percocet, however he sometimes waits until the pain is unbearable before asking  for medication. He has been informed to try asking for pain management when the pain first begins.  - cardiology on board, appreciate their assistance - metoprolol 50mg  BID  - continue percocet  - will continue to monitor  Anxiety - Patient remains anxious and sleep deprived. His anxiety medicine does work when he takes it.  - clonazepam was increased to 1 mg BID on 09/05/11, but he was rather sleepy/drowsy throughout the day today (09/06/11) - change back to clonazepam 0.5 mg BID PRN   Insomnia - Patient reports not sleeping well at night. He does note that his PRN percocet does make him sleepy so I advised him to try taking his dose later in the evening to help him with sleep.  - patient will ask for percocet  before bedtime  - continue trazodone 50 mg qhs   Normocytic anemia - Hgb 7.4 today. MCV 80.7. His hemoglobin has trended down recently and is being watched very closely. 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 Iron = 21, Sat ratio = 9, TIBC =243, Ferritin = 597, Retic count =1.9, FOBT negative. Smear revealed mild anemia with normal morphology. Side effect of micafungin can be suppression of RBC production (3-10%).  - this is being monitored very closely with intervention of PRBC soon if necessary (Hgb <7) - FOBT negative (09/03/11) - FOBT negative again today (09/06/11) - will continue rechecking FOBT with BM's - will recheck a CBC tonight - continue CBC daily - micafungin has been discontinued x 4 days now   Hyponatremia - Na = 133 today. Likely 2/2 his CHF and removal of his biV ICD.  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 His IgM and IgA were increased during his last admission, which could likely be due to his acute illnesses. Hematology consult during previous admission reccommended follow-up after his current infection is completely resolved.  - repeat SPEP/UPEP after full resolution of Candidal infection   Disposition - Rechecking CBC tonight. His FOBT neg. again today. Will need transfusion if Hgb continues to drop.   LOS: 10 days   This is a Psychologist, occupational Note.  The care of the patient was discussed with Dr. Darnelle Maffucci and the assessment and plan formulated with their assistance.  Please see their attached note for official documentation of the daily encounter.  Lewie Chamber 09/06/2011, 3:45 PM   Resident Co-sign Daily Note: I have seen the patient and reviewed the daily progress note by Lewie Chamber MS3 and discussed the care of the patient with them.  See below for documentation of my findings, assessment, and plans.  Subjective: Patient had no major events overnight, does report increased fatigue that normal which he attributes  to recent increase of clonazepam. Still reporting constipation, has not had for several days now.  Objective: Vital signs in last 24 hours: Filed Vitals:   09/06/11 1400 09/06/11 1441 09/06/11 1500 09/06/11 1600  BP: 105/58  112/69 105/55  Pulse: 96  105 108  Temp:      TempSrc:    Oral  Resp: 29  30 26   Height:      Weight:      SpO2: 94% 97% 94% 94%   Physical Exam: General: lying in bed, NAD, fatigued appearing.   Neck: no LAD, no JVD  Lungs: CTA B Heart: RRR, S1, S2 normal, no M/R/G Abdomen: soft, NT,ND, positive BS.   Extremities: No C/C/E  Lab Results: Reviewed and documented in Electronic  Record Micro Results: Reviewed and documented in Electronic Record Studies/Results: Reviewed and documented in Electronic Record Medications: I have reviewed the patient's current medications. Scheduled Meds:   . aspirin EC  81 mg Oral Daily  . ciprofloxacin  500 mg Oral BID  . docusate sodium  100 mg Oral BID  . feeding supplement  237 mL Oral BID BM  . fluconazole (DIFLUCAN) IV  400 mg Intravenous Q24H  . furosemide  60 mg Oral Daily  . levalbuterol  0.63 mg Nebulization Q6H  . metoprolol  50 mg Oral BID  . pantoprazole  40 mg Oral Q1200  . sodium chloride  3 mL Intravenous Q12H  . traZODone  50 mg Oral QHS   Continuous Infusions:   . sodium chloride 10 mL/hr at 09/06/11 0400  . heparin 1,600 Units/hr (09/06/11 1400)   PRN Meds:.acetaminophen, clonazePAM, ipratropium, levalbuterol, oxyCODONE-acetaminophen, polyethylene glycol, sodium chloride, DISCONTD: clonazePAM Assessment/Plan: Please refer to medical students note for detailed plan, in brief;   Normocytic Anemia: Patients hemoglobin continues to trend down, we obtained an FOBT today which was negative, however this may be negative due to patients constipation.  Will also check peripheral smear and LDH for signs of hemolysis. Check CBC this evening, if Hg below 7, type, cross and transfuse 2 units of PRBC and  continue to trend patients hemoglobin especially given that he is on heparin for DVT.   DVT: On therapeutic heparin, if FOBT is positive, will get a GI consult   -will only consider IVC filter if we have to stop anticoagulation due to excessive bleeding   Anxiety: Patients clonazepam was recently increased, however patient notes that this is causing excessive fatigue. Will reduce to previous dose and continue to monitor, note that a component of his fatigue could be secondary to his acute anemia.    LOS: 10 days   Dorethia Jeanmarie 09/06/2011, 4:31 PM

## 2011-09-07 DIAGNOSIS — I4729 Other ventricular tachycardia: Secondary | ICD-10-CM

## 2011-09-07 DIAGNOSIS — I472 Ventricular tachycardia, unspecified: Secondary | ICD-10-CM

## 2011-09-07 LAB — BASIC METABOLIC PANEL
BUN: 14 mg/dL (ref 6–23)
Creatinine, Ser: 0.73 mg/dL (ref 0.50–1.35)
GFR calc Af Amer: >90 mL/min (ref >90)
GFR calc non Af Amer: >90 mL/min (ref >90)
Potassium: 4.1 mEq/L (ref 3.5–5.1)

## 2011-09-07 LAB — CBC
HCT: 23.7 % — ABNORMAL LOW (ref 39.0–52.0)
HCT: 26.8 % — ABNORMAL LOW (ref 39.0–52.0)
MCH: 24.4 pg — ABNORMAL LOW (ref 26.0–34.0)
MCHC: 31.2 g/dL (ref 30.0–36.0)
MCHC: 30.6 g/dL (ref 30.0–36.0)
MCV: 79.8 fL (ref 78.0–100.0)
MCV: 79.8 fL (ref 78.0–100.0)
RDW: 16.3 % — ABNORMAL HIGH (ref 11.5–15.5)
RDW: 16.1 % — ABNORMAL HIGH (ref 11.5–15.5)

## 2011-09-07 LAB — TECHNOLOGIST SMEAR REVIEW: Path Review: ADEQUATE

## 2011-09-07 LAB — LACTATE DEHYDROGENASE: LDH: 253 U/L — ABNORMAL HIGH (ref 94–250)

## 2011-09-07 LAB — HEPARIN LEVEL (UNFRACTIONATED): Heparin Unfractionated: <0.1 IU/mL — ABNORMAL LOW (ref 0.30–0.70)

## 2011-09-07 MED ORDER — DEXTROSE 5 % IV SOLN
1.0000 g | Freq: Three times a day (TID) | INTRAVENOUS | Status: DC
  Administered 2011-09-07 – 2011-09-09 (×6): 1 g via INTRAVENOUS
  Filled 2011-09-07 (×10): qty 1

## 2011-09-07 MED ORDER — METOPROLOL TARTRATE 50 MG PO TABS
75.0000 mg | ORAL_TABLET | Freq: Two times a day (BID) | ORAL | Status: DC
  Administered 2011-09-07: 75 mg via ORAL
  Filled 2011-09-07 (×3): qty 1

## 2011-09-07 MED ORDER — LOSARTAN POTASSIUM 25 MG PO TABS
25.0000 mg | ORAL_TABLET | Freq: Every day | ORAL | Status: DC
  Administered 2011-09-07 – 2011-09-14 (×8): 25 mg via ORAL
  Filled 2011-09-07 (×8): qty 1

## 2011-09-07 MED ORDER — PANTOPRAZOLE SODIUM 40 MG IV SOLR
40.0000 mg | Freq: Two times a day (BID) | INTRAVENOUS | Status: DC
  Administered 2011-09-07 – 2011-09-13 (×13): 40 mg via INTRAVENOUS
  Filled 2011-09-07 (×14): qty 40
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
## 2011-09-07 NOTE — Progress Notes (Signed)
I have reviewed and agree with Medical student Lewie Chamber. Please see my separate note .   Medical Student Daily Progress Note  Subjective: Patient says he is feeling well this morning. He was up the first part of the night unable to sleep, but after around 4am he was able to sleep well with no discomfort. He has been using his incentive spirometer more often over the past couple of days and can definitely move more air now. He had a BM last night that was associated with partial straining and followed by a BM that had some bloody streaks. FOBT was subsequently positive. He noted his right leg had a "twitch" yesterday during his PT session, however this was un-reproducible this morning on exam. He otherwise has been feeling better than he usually does most days, however this is a transient issue. He denies any fevers, chills, night sweats, worsening cough, N/V/D. He is resting in bed comfortably on exam.   Objective: Vital signs in last 24 hours: Filed Vitals:   09/07/11 0500 09/07/11 0600 09/07/11 0700 09/07/11 0740  BP: 107/70 100/54 89/47   Pulse: 95 109 104   Temp:      TempSrc:      Resp: 32 26 27   Height:      Weight:      SpO2: 99% 95% 95% 100%   Weight change:   Intake/Output Summary (Last 24 hours) at 09/07/11 0832 Last data filed at 09/07/11 0700  Gross per 24 hour  Intake   1407 ml  Output   1275 ml  Net    132 ml   Physical Exam: BP 89/47  Pulse 104  Temp(Src) 98.5 F (36.9 C) (Oral)  Resp 27  Ht 5\' 10"  (1.778 m)  Wt 82.555 kg (182 lb)  BMI 26.11 kg/m2  SpO2 100% General appearance: alert, cooperative and no distress Head: Normocephalic, without obvious abnormality, atraumatic Neck: no adenopathy, no carotid bruit, no JVD, supple, symmetrical, trachea midline and thyroid not enlarged, symmetric, no tenderness/mass/nodules Back: symmetric, no curvature. ROM normal. No CVA tenderness. Lungs: diminished breath sounds RLL, rales RML and "dry" Chest wall: no  tenderness Heart: regular rate and rhythm, S1, S2 normal, no murmur, click, rub or gallop Abdomen: soft, non-tender; bowel sounds normal; no masses,  no organomegaly Extremities: extremities normal, atraumatic, no cyanosis or edema Skin: Skin color, texture, turgor normal. No rashes or lesions Lab Results: Basic Metabolic Panel:  Lab 09/07/11 1610 09/06/11 0410 09/04/11 1800  NA 129* 133* --  K 4.1 4.3 --  CL 92* 95* --  CO2 27 29 --  GLUCOSE 126* 106* --  BUN 14 14 --  CREATININE 0.73 0.71 --  CALCIUM 9.2 8.8 --  MG -- -- 2.2  PHOS -- -- --   Liver Function Tests:  Lab 08/31/11 1631  AST 36  ALT 21  ALKPHOS 155*  BILITOT 0.2*  PROT 7.8  ALBUMIN 2.0*   CBC:  Lab 09/07/11 0510 09/06/11 1910 09/02/11 0437 08/31/11 1631  WBC 9.7 12.2* -- --  NEUTROABS -- -- 7.3 6.5  HGB 7.4* 7.6* -- --  HCT 23.7* 24.9* -- --  MCV 79.8 79.8 -- --  PLT 427* 421* -- --   Cardiac Enzymes:  Lab 09/06/11 0410 09/05/11 2322 09/05/11 1830  CKTOTAL 8 8 14   CKMB 1.2 1.2 1.0  CKMBINDEX -- -- --  TROPONINI <0.30 <0.30 <0.30   BNP:  Lab 09/01/11 0500  PROBNP 973.1*   Coagulation:  Lab 09/04/11 0500  LABPROT 15.3*  INR 1.18   Urinalysis:  Lab 08/31/11 1557  COLORURINE YELLOW  LABSPEC 1.020  PHURINE 7.0  GLUCOSEU NEGATIVE  HGBUR NEGATIVE  BILIRUBINUR NEGATIVE  KETONESUR NEGATIVE  PROTEINUR NEGATIVE  UROBILINOGEN 1.0  NITRITE NEGATIVE  LEUKOCYTESUR NEGATIVE    Micro Results: Recent Results (from the past 240 hour(s))  BODY FLUID CULTURE     Status: Normal   Collection Time   08/28/11  3:54 PM      Component Value Range Status Comment   Specimen Description PLEURAL FLUID RIGHT   Final    Special Requests SYR   Final    Gram Stain     Final    Value: RARE WBC PRESENT,BOTH PMN AND MONONUCLEAR     NO ORGANISMS SEEN   Culture NO GROWTH 3 DAYS   Final    Report Status 09/01/2011 FINAL   Final   FUNGUS CULTURE W SMEAR     Status: Normal (Preliminary result)    Collection Time   08/28/11  3:55 PM      Component Value Range Status Comment   Specimen Description PLEURAL FLUID RIGHT   Final    Special Requests SYR   Final    Fungal Smear NO YEAST OR FUNGAL ELEMENTS SEEN   Final    Culture CULTURE IN PROGRESS FOR FOUR WEEKS   Final    Report Status PENDING   Incomplete   ANAEROBIC CULTURE     Status: Normal   Collection Time   08/28/11  3:55 PM      Component Value Range Status Comment   Specimen Description PLEURAL FLUID RIGHT   Final    Special Requests SYR   Final    Gram Stain     Final    Value: RARE WBC PRESENT,BOTH PMN AND MONONUCLEAR     NO ORGANISMS SEEN   Culture NO ANAEROBES ISOLATED   Final    Report Status 09/02/2011 FINAL   Final   AFB CULTURE WITH SMEAR     Status: Normal (Preliminary result)   Collection Time   08/28/11  3:56 PM      Component Value Range Status Comment   Specimen Description PLEURAL FLUID RIGHT   Final    Special Requests SYR   Final    ACID FAST SMEAR NO ACID FAST BACILLI SEEN   Final    Culture     Final    Value: CULTURE WILL BE EXAMINED FOR 6 WEEKS BEFORE ISSUING A FINAL REPORT   Report Status PENDING   Incomplete   URINE CULTURE     Status: Normal   Collection Time   08/31/11  3:57 PM      Component Value Range Status Comment   Specimen Description URINE, RANDOM   Final    Special Requests NONE   Final    Culture  Setup Time [id number]   Final    Colony Count 85,000 COLONIES/ML   Final    Culture KLEBSIELLA PNEUMONIAE   Final    Report Status 09/03/2011 FINAL   Final    Organism ID, Bacteria KLEBSIELLA PNEUMONIAE   Final   CULTURE, BLOOD (ROUTINE X 2)     Status: Normal   Collection Time   08/31/11  4:07 PM      Component Value Range Status Comment   Specimen Description BLOOD RIGHT HAND   Final    Special Requests BOTTLES DRAWN AEROBIC AND ANAEROBIC 8CC   Final  Culture  Setup Time [id number]   Final    Culture NO GROWTH 5 DAYS   Final    Report Status 09/06/2011 FINAL   Final    CULTURE, BLOOD (ROUTINE X 2)     Status: Normal   Collection Time   08/31/11  4:25 PM      Component Value Range Status Comment   Specimen Description BLOOD RIGHT FOOT   Final    Special Requests BOTTLES DRAWN AEROBIC AND ANAEROBIC 10CC   Final    Culture  Setup Time [id number]   Final    Culture NO GROWTH 5 DAYS   Final    Report Status 09/06/2011 FINAL   Final   MRSA PCR SCREENING     Status: Normal   Collection Time   09/04/11  8:30 PM      Component Value Range Status Comment   MRSA by PCR NEGATIVE  NEGATIVE  Final    Studies/Results: Dg Chest Port 1 View  09/06/2011  *RADIOLOGY REPORT*  Clinical Data: Evaluate PICC line placement  PORTABLE CHEST - 1 VIEW  Comparison: 09/01/2011  Findings:  Right arm PICC line tip is in the SVC.  There is mild cardiac enlargement and mild interstitial edema. Partially loculated right pleural effusion is again noted.  Airspace opacities noted within the left midlung, similar to previous exam.  IMPRESSION:  1.  No change and pulmonary edema and pleural effusion. 2.  Persistent left midlung airspace opacity.  Original Report Authenticated By: Rosealee Albee, M.D.   Medications:  I have reviewed the patient's current medications. Anti-infectives     Start     Dose/Rate Route Frequency Ordered Stop   09/04/11 2000   ciprofloxacin (CIPRO) tablet 500 mg        500 mg Oral 2 times daily 09/04/11 1410     09/02/11 1400   fluconazole (DIFLUCAN) IVPB 400 mg        400 mg 200 mL/hr over 60 Minutes Intravenous Every 24 hours 09/02/11 1300     09/02/11 1200   vancomycin (VANCOCIN) 1,250 mg in sodium chloride 0.9 % 250 mL IVPB  Status:  Discontinued        1,250 mg 166.7 mL/hr over 90 Minutes Intravenous Every 12 hours 09/02/11 1001 09/03/11 0947   08/31/11 1600   vancomycin (VANCOCIN) IVPB 1000 mg/200 mL premix  Status:  Discontinued        1,000 mg 200 mL/hr over 60 Minutes Intravenous Every 8 hours 08/31/11 1527 09/02/11 1001   08/31/11 1600    piperacillin-tazobactam (ZOSYN) IVPB 3.375 g  Status:  Discontinued        3.375 g 12.5 mL/hr over 240 Minutes Intravenous 3 times per day 08/31/11 1527 09/04/11 1410   08/27/11 2200   micafungin (MYCAMINE) 150 mg in sodium chloride 0.9 % 100 mL IVPB  Status:  Discontinued        150 mg 100 mL/hr over 1 Hours Intravenous Every 24 hours 08/27/11 2031 09/02/11 1300         Scheduled Meds:   . aspirin EC  81 mg Oral Daily  . ciprofloxacin  500 mg Oral BID  . docusate sodium  100 mg Oral BID  . feeding supplement  237 mL Oral BID BM  . fluconazole (DIFLUCAN) IV  400 mg Intravenous Q24H  . furosemide  60 mg Oral Daily  . levalbuterol  0.63 mg Nebulization Q6H  . metoprolol  50 mg Oral BID  . pantoprazole  40  mg Oral Q1200  . sodium chloride  3 mL Intravenous Q12H  . traZODone  50 mg Oral QHS   Continuous Infusions:   . sodium chloride 10 mL/hr at 09/06/11 1900  . heparin 2,000 Units/hr (09/07/11 0500)   PRN Meds:.acetaminophen, clonazePAM, ipratropium, levalbuterol, oxyCODONE-acetaminophen, polyethylene glycol, sodium chloride, DISCONTD: clonazePAM Assessment/Plan:  #Normocytic anemia - Hgb 7.4 today. MCV 79.8. His hemoglobin has trended down recently and is being watched very closely. 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 Iron = 21, Sat ratio = 9, TIBC =243, Ferritin = 597, Retic count =1.9. Smear revealed mild anemia with normal morphology. Side effect of micafungin can be suppression of RBC production (3-10%).  - GI consult obtained this morning, appreciate their assistance in the management of this patient - being monitored very closely with intervention of PRBC soon if necessary (Hgb <7)  - FOBT negative (09/03/11)  - FOBT negative (09/06/11) - FOBT positive (09/07/11)  - will continue rechecking FOBT with BM's   - continue CBC daily  - micafungin has been discontinued x 6 days now    #Distal Femoral Vein thrombosis - Patient had LE doppler on 09/03/11 that showed  DVT in right femoral vein distally. On admission 08/27/11 was put on SCD's and changed to Lovenox on 08/29/11. Although LE doppler revealed DVT, question of how long patient may have had the clot remains. He was discharged 5/24 and readmitted 5/28 with minimal activity while home. While inpatient last admit and this admission he has received Lovenox for DVT ppx.  MRI/MRA head on 09/04/11: see read. Notable small bulge proximal A1 segment of left ACA (not a typical place for mycotic aneurysm however)  - heparin per pharmacy, no bolus, with target lower end of therapeutic range - if patient does not bleed with heparin he would be a good candidate for transition to Coumadin for appropriate length of time 2/2 DVT, if he does begin to bleed with heparin, he will require reversal and imminent IVC filter   #Pleural effusion / pulmonary embolism / pulmonary infarction - Patient is s/p thoracentesis (08/28/11) for R effusion. Most recent CT chest (09/01/11) shows unchanged central right PA Candidal embolus (extension to RML, RLL), and embolus/occlusion of LUL pulmonary artery branches. Moderate right pleural effusion present (possibly loculated). Wedge shaped infarcts noted in RML and LUL.  - continue percocet 5/325 q6prn and tylenol 325-650mg  prn for pain  - continue atrovent q4prn / xopenex q4prn nebs  - continue lasix 60 mg daily  - Pleural fluid culture: Negative, final - Pleural fluid Fungus culture: in progress x 4 weeks (collected 08/28/11): NTD - Pleural fluid AFB culture w/ smear: in progress x 6 weeks (collected 08/28/11): NTD  #Bacteriuria - Patient's urine culture revealed Klebsiella pneumoniae. 85,000 col/mL. Urine culture pansensitive except for Amoxicillin.  D/c zosyn (08/31/11>>09/04/11). Started Cipro on 6/5. - Needs 10 day total. Per ID recommended to change to Cefepime to complete the course. Cipro higher risk for C.diff.   #Acute on chronic systolic CHF 2/2 CAD and ischemic cardiomyopathy -Improved. 2D  echo (08/23/11) shows EF = 30% with diffuse hypokinesis, mild LVH, and moderately dilated LA. 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  - Appreciate cardiology consult in the management of this patient  - continue ASA 81 mg daily  - metoprolol will increase to 75 mg BID - Will add losartan 25 mg daily per cardiology - continue lasix 60 daily--foley removed on 09/02/11   #  NSVT - Patient has had recent episodes of NSVT. Most recent significant episode of 47 beat run over 10 seconds 09/04/11.  - metoprolol increased per cardiology with goal of K>3.9 and Mg>1.9  #Tachycardia - Patient continues to have episodes of tachycardia (90's -110's). Anemia, SOB, Stress response, and anxiety contributing components of this. His pain management (left sided and due to his infarcted lung) is well under control with his percocet, however he sometimes waits until the pain is unbearable before asking for medication. He has been informed to ask for pain management when the pain first begins.  - cardiology on board, appreciate their assistance  - metoprolol 75mg  BID  - continue percocet  - will continue to monitor  - will recheck orthostatics today - anemia monitored closely  # RLL consolidation - Patient spiked fever and had relative leukocytosis on 08/31/11 with RLL consolidation seen on CXR as well. Per ID most likely not HCAP - Appreciate ID consult in the management of this patient  - d/c vanc (08/31/11>>09/03/11)  - d/c zosyn (08/31/11>>09/04/11)  - started Cipro for UTI on 6/5 - blood cultures x 2 : Negative, final   #Fever - resolved;  Patient had Candidal embolus (from atrial lead on former ICD) to R pulmonary artery on 08/15/11. Fevers likely due to gradual pieces of the mass breaking off, with appropriate immune response causing him to have fevers every couple of days. Question of his old pacer pocket having an abscess/infection low possibility at this point but has been considered  (no warmth, erythema, tenderness, disproportionate swelling).  - Appreciate ID recommendations and consult in the management of this patient  - blood cultures x 2 : Negative, final - change micafungin (08/15/11>>09/01/11) to IV fluconazole 400 mg daily to finish 6 week course (08/15/11>>)  - Candida sensitivities are back from reference lab: sensitive to both micafungin and fluconazole    #Hyponatremia - Na = 129 . Likely 2/2 his CHF and removal of his biV ICD.  - will continue to monitor   #GERD / Esophageal Candidiasis - Stable. He does note some minor discomfort with swallowing, however he also states that the dilation on 08/13/11 was not a complete dilation that he is used to.  - continue fluconazole  - continue Protonix 40mg  daily   #Anxiety - Patient remains anxious and sleep deprived. His anxiety medicine does work when he takes it.  - clonazepam was increased to 1 mg BID on 09/05/11, but he was rather sleepy/drowsy throughout the following day, (09/06/11)  - change back to clonazepam 0.5 mg BID PRN   #Insomnia - Patient reports not sleeping well at night. He does note that his PRN percocet does make him sleepy so I advised him to try taking his dose later in the evening to help him with sleep.  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 His IgM and IgA were increased during his last admission, which could likely be due to his acute illnesses. Hematology consult during previous admission reccommended follow-up after his current infection is completely resolved.  - repeat SPEP/UPEP after full resolution of Candidal infection   Disposition - Pending clinical improvement. GI consulted today. Will continue close monitoring and transfer to SDU.   LOS: 11 days   This is a Psychologist, occupational Note.  The care of the patient was discussed with Dr. Almyra Deforest and the assessment and plan formulated with their  assistance.   Please see their attached note for official documentation of the daily encounter.   Lewie Chamber 09/07/2011, 8:32 AM   Resident Addendum to Medical Student Note   I have seen and examined the patient, and agree with the the medical student assessment and plan outlined above. Please see my brief note below for additional details.  HPI: There were no events overnight. Currently, the patient feels a lot better. Patient noted he is able to sit up in a chair and would like to walk. His breathing has significantly improved. He has been using the incentive spirometry a regular basis. Noted he had a good night sleep. "He is ready to go. "   OBJECTIVE: VS: Reviewed  Meds: Reviewed  Labs: Reviewed  Imaging: Reviewed   Physical Exam: General: Vital signs reviewed and noted. Well-developed, well-nourished, in no acute distress; alert, appropriate and cooperative throughout examination.  Lungs:  Diminished breath sounds at the bases RLL>LLL.   Heart: RRR. S1 and S2 normal without gallop, murmur, or rubs.  Abdomen:  BS normoactive. Soft, Nondistended, non-tender.  No masses or organomegaly.  Extremities: No pretibial edema.     ASSESSMENT/ PLAN: Pt is a 66 y.o. yo male  who was admitted on 08/27/2011 with symptoms of SOB, which was determined to be secondary to Pleural effusion. Since admission patient had a complicated course . Including:  # Normocytic anemia - Hgb 7.4 today. MCV 79.8.  Hgb has been trending down since admission. FOBT test positive x1 after BM ( patient was constipated for 4 days).  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 Iron = 21, Sat ratio = 9, TIBC =243, Ferritin = 597, Retic count =1.9, FOBT negative. Smear revealed mild anemia with normal morphology. Side effect of micafungin can be suppression of RBC production (3-10%).  Per GI: conservative management. Monitor Hgb then endoscopy.    - Will monitor CBG. Transfuse if Hgb <7 and possible Endoscopy   Distal  Femoral Vein thrombosis - Patient had LE doppler on 09/03/11 that showed DVT in right femoral vein distally.  MRI/MRA head on 09/04/11: see read. Notable small bulge proximal A1 segment of left ACA (not a typical place for mycotic aneurysm however)  - heparin per pharmacy, with target lower end of therapeutic range - Hopefully be able to anticoagulate patient with Coumadin .  - Will monitor closely for any signs of bleeding. If stable no IVC filer will be placed.    #Pleural effusion / pulmonary embolism / pulmonary infarction - Patient is s/p thoracentesis (08/28/11) for R effusion. Most recent CT chest (09/01/11) shows unchanged central right PA Candidal embolus (extension to RML, RLL), and embolus/occlusion of LUL pulmonary artery branches. Moderate right pleural effusion present (possibly loculated). Wedge shaped infarcts noted in RML and LUL.  Pleural fluid culture: Negative, final,  Pleural fluid Fungus culture: in progress x 4 weeks (collected 08/28/11): NTD,  Pleural fluid AFB culture w/ smear: in progress x 6 weeks (collected 08/28/11): NTD - Continue conservative management with pain medication and inhalers     # Bacteriuria - Patient's urine culture revealed Klebsiella pneumoniae. 85,000 col/mL. Urine culture pansensitive except for Amoxicillin.  D/c zosyn (08/31/11>>09/04/11). Started Cipro on 6/5. - Needs 10 day total. Per ID recommended to change to Cefepime to complete the course. Cipro higher risk for C.diff.   #Acute on chronic systolic CHF 2/2 CAD and ischemic cardiomyopathy -Improved. 2D echo (08/23/11) shows EF = 30% with diffuse hypokinesis, mild LVH, and moderately dilated  LA. 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 Currently on Aspirin, Metoprolol 75 BID.  - Appreciate cardiology consult in the management of this patient  - Will add losartan 25 mg daily per cardiology - Continue lasix 60 daily  #NSVT - Patient has had recent episodes of NSVT. Most  recent significant episode of 47 beat run over 10 seconds 09/04/11.  Currently on metoprolol 75 BID.  - Continue current regimen  - Goal of K>3.9 and Mg>1.9  #Tachycardia - Patient continues to have episodes of tachycardia (90's -110's). Multifactorial . Currently on metoprolol 75 BID.  - Continue current regimen  - Will continue to monitor    #Fever - resolved;  Patient had Candidal embolus (from atrial lead on former ICD) to R pulmonary artery on 08/15/11.   blood cultures x 2 : Negative, final. Change micafungin (08/15/11>>09/01/11) to IV fluconazole 400 mg daily to finish 6 week course (08/15/11>>) since cultures sensitive to micafungin and fluconazole   - Will continue to monitor  - Currently treated for Bacteruria  - Old pacer pocket (no warmth, erythema, tenderness, disproportionate swelling).  Will continue to monitor  - Appreciate ID recommendations and consult in the management of this patient    #Hyponatremia - Na = 129 . Likely 2/2 his CHF and removal of his biV ICD.  - will continue to monitor   #GERD / Esophageal Candidiasis - Stable. He does note some minor discomfort with swallowing, however he also states that the dilation on 08/13/11 was not a complete dilation that he is used to.  - continue fluconazole  - continue Protonix 40mg  daily   #Anxiety - Improved. Currently on  clonazepam 0.5 mg BID PRN.   - Continue current regimen  #Insomnia - Improved.  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 His IgM and IgA were increased during his last admission, which could likely be due to his acute illnesses. Hematology consult during previous admission reccommended follow-up after his current infection is completely resolved.  - repeat SPEP/UPEP after full resolution of Candidal infection   Disposition - Pending clinical improvement. Will continue close monitoring and transfer to SDU.   Length of  Stay: 11   Almyra Deforest, MD  Internal Medicine Resident Pager: [phone]  09/07/2011, 1:19 PM

## 2011-09-07 NOTE — Progress Notes (Signed)
PROGRESS NOTE  Subjective:   Pt is feeling better.    He is feeling well.  Denies CP or SOB   Objective:    Vital Signs:   Temp:  [98.2 F (36.8 C)-98.9 F (37.2 C)] 98.5 F (36.9 C) (06/08 0400) Pulse Rate:  [85-115] 115  (06/08 0929) Resp:  [20-32] 27  (06/08 0700) BP: (89-167)/(30-130) 125/74 mmHg (06/08 0929) SpO2:  [94 %-100 %] 100 % (06/08 0740) FiO2 (%):  [28 %] 28 % (06/07 1441)  Last BM Date: 09/02/11   24-hour weight change: Weight change:   Weight trends: Filed Weights   08/30/11 0500 09/01/11 0515 09/03/11 0554  Weight: 181 lb 9.6 oz (82.373 kg) 180 lb 1.9 oz (81.7 kg) 182 lb (82.555 kg)    Intake/Output:  06/07 0701 - 06/08 0700 In: 1670 [P.O.:840; I.V.:630; IV Piggyback:200] Out: 1675 [Urine:1675]     Physical Exam: BP 125/74  Pulse 115  Temp(Src) 98.5 F (36.9 C) (Oral)  Resp 27  Ht 5\' 10"  (1.778 m)  Wt 182 lb (82.555 kg)  BMI 26.11 kg/m2  SpO2 100%  General: Vital signs reviewed and noted. Well-developed, well-nourished, in no acute distress; alert, appropriate and cooperative .  Head: Normocephalic, atraumatic.  Eyes: conjunctivae/corneas clear. PERRL, EOM's intact.   Throat: normal  Neck: Supple. Normal carotids. No JVD  Lungs:  Clear bilaterally to auscultation  Heart: Tachy regular rhythm. No murmurs, gallops or rubs  Abdomen:  Soft, non-tender, non-distended with normoactive bowel sounds. No hepatomegaly. No rebound/guarding. No abdominal masses.  Extremities: Distal pedal pulses are 2+ .  No edema.    Neurologic: A&O X3, CN II - XII are grossly intact. Motor strength is 5/5 in the all 4 extremities.  Psych: Responds to questions appropriately with normal affect.  Chest- moderate but very stable hematoma at extraction site  Labs: BMET:  Basename 09/07/11 0510 09/06/11 0410 09/04/11 1800  NA 129* 133* --  K 4.1 4.3 --  CL 92* 95* --  CO2 27 29 --  GLUCOSE 126* 106* --  BUN 14 14 --  CREATININE 0.73 0.71 --  CALCIUM 9.2 8.8  --  MG -- -- 2.2  PHOS -- -- --    Liver function tests: No results found for this basename: AST:2,ALT:2,ALKPHOS:2,BILITOT:2,PROT:2,ALBUMIN:2 in the last 72 hours No results found for this basename: LIPASE:2,AMYLASE:2 in the last 72 hours  CBC:  Basename 09/07/11 0510 09/06/11 1910  WBC 9.7 12.2*  NEUTROABS -- --  HGB 7.4* 7.6*  HCT 23.7* 24.9*  MCV 79.8 79.8  PLT 427* 421*    Cardiac Enzymes:  Basename 09/06/11 0410 09/05/11 2322 09/05/11 1830 09/05/11 1335  CKTOTAL 8 8 14 11   CKMB 1.2 1.2 1.0 1.2  TROPONINI <0.30 <0.30 <0.30 <0.30    Coagulation Studies: No results found for this basename: LABPROT:5,INR:5 in the last 72 hours  Other:    Tele:  Sinus tachy, LBBB  Medications:    Infusions:    . sodium chloride 10 mL/hr at 09/06/11 1900  . heparin 2,200 Units/hr (09/07/11 0800)    Scheduled Medications:    . aspirin EC  81 mg Oral Daily  . ciprofloxacin  500 mg Oral BID  . docusate sodium  100 mg Oral BID  . feeding supplement  237 mL Oral BID BM  . fluconazole (DIFLUCAN) IV  400 mg Intravenous Q24H  . furosemide  60 mg Oral Daily  . levalbuterol  0.63 mg Nebulization Q6H  . metoprolol  50 mg Oral  BID  . pantoprazole  40 mg Oral Q1200  . sodium chloride  3 mL Intravenous Q12H  . traZODone  50 mg Oral QHS    Assessment/ Plan:    1.  NSVT- I will increase metoprolol to 75mg  BID maintaing K>3.9 and Mg>1.9  2. Sinus tachycardia- multifactoral  (infection, DVT, illness, anemia) Would likely improve with treatment of profound anemia, but will defer timing of transfusion to primary team.  3.  CHF: / LBBB-   EF 30%.  Continue metoprolol. Cont. Lasix.  He had a cough with ACE inhibitor.  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  I think it will take a long time to clear this.  Defer management at this point to ID.  5. DVT:  Agree with heparin.  Caution given  anemia  He does not need to stay in CCU. Please transfer to stepdown today.  Disposition: plans per Int. Med team.   Length of Stay: 11  Carlos Hoard,MD

## 2011-09-07 NOTE — Progress Notes (Signed)
Pt had BM; upon examination, noticed red streaks in feces; occult stool lab test sent down; came back POSITIVE for blood in stool; MD paged

## 2011-09-07 NOTE — Progress Notes (Signed)
ANTICOAGULATION CONSULT NOTE - Follow Up Consult  Pharmacy Consult for heparin Indication: DVT and septic lung emboli  Labs:  Basename 09/07/11 0510 09/06/11 2358 09/06/11 1910 09/06/11 1250 09/06/11 0410 09/05/11 2322 09/05/11 1830 09/05/11 0435  HGB 7.4* -- 7.6* -- -- -- -- --  HCT 23.7* -- 24.9* -- 23.8* -- -- --  PLT 427* -- 421* -- 395 -- -- --  APTT -- -- -- -- -- -- -- --  LABPROT -- -- -- -- -- -- -- --  INR -- -- -- -- -- -- -- --  HEPARINUNFRC <0.10* <0.10* -- <0.10* -- -- -- --  CREATININE 0.73 -- -- -- 0.71 -- -- 0.82  CKTOTAL -- -- -- -- 8 8 14  --  CKMB -- -- -- -- 1.2 1.2 1.0 --  TROPONINI -- -- -- -- <0.30 <0.30 <0.30 --    Assessment: 66yo male remains with undetectable heparin level despite multiple rate increases, no issues with central line per RN, heparin may be consumed by clot burden, which is heavy. Patient now FOBT + with "blood streaks" on wiping per RN, Hgb 7.4 but stable, plt excellent at 427.  Goal of Therapy:  Heparin level 0.3-0.5 units/ml   Plan:  1. Increase heparin rate to 2200 units/hour 2. F/U HL at 1430  Moiz Ryant, [place] R PharmD [phone] 09/07/2011,8:31 AM
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
## 2011-09-07 NOTE — Progress Notes (Signed)
Note I am covering for Dr. Meredith Pel for the Internal Medicine service but also in this specific patient for Dr. Ninetta Lights from ID as well. [name] is well known to me and several of my partners as we have been treating him for fungal pacemaker infection (sp removal of PM) complicated by embolization of clot to pulmonary arteries with lung infarction. I had him readmitted from ID clinic in May and he has been broadly covered for HCAp with vancomycin, zosyn, then narrowed to cipro (partly based on narrowed therapy for Klebsiella in urine.   ID:  ? HCAP, plus Klebsiella in urine:  With regards to his antibacterial coverage, I would prefer to change him from cipro to cefepime for now. His kleb pneumo in urine on the 1st has long since been adequetely treated. I would prefer the cefepime for 2 reasons, #1 less risk for C diff vs cipro, though ceph also a risk and #2 less risk for QT prolongation with concomittant fluconazole therapy   2. Candida pacemaker infection: continue fluconazole  3. Anemia:  The patient has had downtrending in hemoglobin that may be due to oozing from esophagitis sites per Dr. Dulce Sellar from GI who has Graciously come and seen [name] this Saturday morning .   The hemoglobin is very close to threshold for transfusion (I think in next couple of days he will drop into range for this). I do NOT see evidence for acute drop to suggest fulminant process. He apparently also has hx of diverticulosis on colonscopy in past  diverticular bleed at present seems VERY UNLIKELY.   Will continue to follow closely and GREATly appreciate GI help here.  While we are considering trying to attenuate the risk of C difficile colitis, I would personally prefer to just have him on once daily PPI rather than a BID IV PPI. . I do not see a clear indication for the latter,  4. DVT, and PE (though latter thought due to PM removal ane embolization of clot: --will continue heparin

## 2011-09-07 NOTE — Progress Notes (Signed)
ANTICOAGULATION CONSULT NOTE - Follow Up Consult  Pharmacy Consult for heparin Indication: DVT and septic lung emboli  Labs:  Basename 09/06/11 2358 09/06/11 1910 09/06/11 1250 09/06/11 0450 09/06/11 0410 09/05/11 2322 09/05/11 1830 09/05/11 1620 09/05/11 0435 09/04/11 1800 09/04/11 0500  HGB -- 7.6* -- -- 7.4* -- -- -- -- -- --  HCT -- 24.9* -- -- 23.8* -- -- 24.5* -- -- --  PLT -- 421* -- -- 395 -- -- 400 -- -- --  APTT -- -- -- -- -- -- -- -- -- -- 37  LABPROT -- -- -- -- -- -- -- -- -- -- 15.3*  INR -- -- -- -- -- -- -- -- -- -- 1.18  HEPARINUNFRC <0.10* -- <0.10* <0.10* -- -- -- -- -- -- --  CREATININE -- -- -- -- 0.71 -- -- -- 0.82 0.85 --  CKTOTAL -- -- -- -- 8 8 14  -- -- -- --  CKMB -- -- -- -- 1.2 1.2 1.0 -- -- -- --  TROPONINI -- -- -- -- <0.30 <0.30 <0.30 -- -- -- --    Assessment: 66yo male remains with undetectable heparin level despite multiple rate increases, no issues with central line per RN, heparin may be consumed by clot.  Goal of Therapy:  Heparin level 0.3-0.5 units/ml   Plan:  Will increase heparin gtt more aggressively by 4-5 units/kg/hr to 2000 units/hr and check level in 6hr.  Colleen Can PharmD BCPS 09/07/2011,12:51 AM

## 2011-09-07 NOTE — Progress Notes (Signed)
ANTICOAGULATION CONSULT NOTE - Follow Up Consult  Pharmacy Consult for heparin Indication: DVT and septic lung emboli  Labs:  Basename 09/07/11 1407 09/07/11 0510 09/06/11 2358 09/06/11 1910 09/06/11 0410 09/05/11 2322 09/05/11 1830 09/05/11 0435  HGB 8.2* 7.4* -- -- -- -- -- --  HCT 26.8* 23.7* -- 24.9* -- -- -- --  PLT 493* 427* -- 421* -- -- -- --  APTT -- -- -- -- -- -- -- --  LABPROT -- -- -- -- -- -- -- --  INR -- -- -- -- -- -- -- --  HEPARINUNFRC 0.13* <0.10* <0.10* -- -- -- -- --  CREATININE -- 0.73 -- -- 0.71 -- -- 0.82  CKTOTAL -- -- -- -- 8 8 14  --  CKMB -- -- -- -- 1.2 1.2 1.0 --  TROPONINI -- -- -- -- <0.30 <0.30 <0.30 --    Assessment: 66yo male remains with subtherapeutic heparin level 0.1 despite multiple rate increases, including this morning. No issues with central line per RN, heparin may be consumed by clot burden, which is heavy. Patient now FOBT + with "blood streaks" on wiping per RN, Hgb 7.4 but stable, plt excellent at 427.  Goal of Therapy:  Heparin level 0.3-0.5 units/ml   Plan:  1. Increase heparin rate to 2500 units/hour 2. F/U HL at 2130  Bernice Mullin, [place] R PharmD [phone] 09/07/2011,3:18 PM

## 2011-09-07 NOTE — Progress Notes (Signed)
MD called back; MD update on pt's positive stool sample; no new orders received at this time; will continue to monitor and update as needed

## 2011-09-07 NOTE — Progress Notes (Signed)
MD has not paged back about positive stool sample; will notify day RN to notify MD;

## 2011-09-08 LAB — CBC
Hemoglobin: 7.9 g/dL — ABNORMAL LOW (ref 13.0–17.0)
MCH: 24.1 pg — ABNORMAL LOW (ref 26.0–34.0)
MCH: 24.8 pg — ABNORMAL LOW (ref 26.0–34.0)
MCV: 79.9 fL (ref 78.0–100.0)
MCV: 80.9 fL (ref 78.0–100.0)
Platelets: 461 10*3/uL — ABNORMAL HIGH (ref 150–400)
RBC: 3.03 MIL/uL — ABNORMAL LOW (ref 4.22–5.81)
RBC: 3.19 MIL/uL — ABNORMAL LOW (ref 4.22–5.81)
WBC: 8.6 10*3/uL (ref 4.0–10.5)

## 2011-09-08 LAB — BASIC METABOLIC PANEL WITH GFR
BUN: 13 mg/dL (ref 6–23)
CO2: 28 meq/L (ref 19–32)
Calcium: 8.9 mg/dL (ref 8.4–10.5)
Chloride: 94 meq/L — ABNORMAL LOW (ref 96–112)
Creatinine, Ser: 0.68 mg/dL (ref 0.50–1.35)
GFR calc Af Amer: >90 mL/min
GFR calc non Af Amer: >90 mL/min
Glucose, Bld: 106 mg/dL — ABNORMAL HIGH (ref 70–99)
Potassium: 4.1 meq/L (ref 3.5–5.1)
Sodium: 131 meq/L — ABNORMAL LOW (ref 135–145)

## 2011-09-08 LAB — HEPARIN LEVEL (UNFRACTIONATED)
Heparin Unfractionated: 0.39 [IU]/mL (ref 0.30–0.70)
Heparin Unfractionated: 0.38 [IU]/mL (ref 0.30–0.70)

## 2011-09-08 MED ORDER — CLONAZEPAM 1 MG PO TABS
1.0000 mg | ORAL_TABLET | Freq: Every day | ORAL | Status: DC
  Administered 2011-09-08 – 2011-09-13 (×6): 1 mg via ORAL
  Filled 2011-09-08 (×6): qty 1

## 2011-09-08 MED ORDER — CLONAZEPAM 0.5 MG PO TABS
0.5000 mg | ORAL_TABLET | Freq: Every day | ORAL | Status: DC
  Administered 2011-09-10 – 2011-09-14 (×4): 0.5 mg via ORAL
  Filled 2011-09-08 (×8): qty 1

## 2011-09-08 MED ORDER — METOPROLOL TARTRATE 100 MG PO TABS
100.0000 mg | ORAL_TABLET | Freq: Two times a day (BID) | ORAL | Status: DC
Start: 2011-09-08 — End: 2011-09-14
  Administered 2011-09-08 – 2011-09-14 (×13): 100 mg via ORAL
  Filled 2011-09-08 (×14): qty 1

## 2011-09-08 NOTE — Progress Notes (Signed)
ANTICOAGULATION CONSULT NOTE - Follow Up Consult  Pharmacy Consult for heparin Indication: DVT and septic lung emboli  Labs:  Basename 09/07/11 2320 09/07/11 1407 09/07/11 0510 09/06/11 1910 09/06/11 0410 09/05/11 2322 09/05/11 1830 09/05/11 0435  HGB -- 8.2* 7.4* -- -- -- -- --  HCT -- 26.8* 23.7* 24.9* -- -- -- --  PLT -- 493* 427* 421* -- -- -- --  APTT -- -- -- -- -- -- -- --  LABPROT -- -- -- -- -- -- -- --  INR -- -- -- -- -- -- -- --  HEPARINUNFRC 0.26* 0.13* <0.10* -- -- -- -- --  CREATININE -- -- 0.73 -- 0.71 -- -- 0.82  CKTOTAL -- -- -- -- 8 8 14  --  CKMB -- -- -- -- 1.2 1.2 1.0 --  TROPONINI -- -- -- -- <0.30 <0.30 <0.30 --    Assessment: 66yo male remains subtherapeutic on heparin though now increasing and closer to goal after multiple increases.  Goal of Therapy:  Heparin level 0.3-0.5 units/ml   Plan:  Will increase gtt to 2700 units/hr and check level in 6hr.  Colleen Can PharmD BCPS 09/08/2011,12:22 AM

## 2011-09-08 NOTE — Progress Notes (Signed)
ANTICOAGULATION CONSULT NOTE - Follow Up Consult  Pharmacy Consult for heparin Indication: DVT and septic lung emboli  Labs:  Basename 09/08/11 1357 09/08/11 0848 09/08/11 0527 09/07/11 2320 09/07/11 1407 09/07/11 0510 09/06/11 0410 09/05/11 2322 09/05/11 1830  HGB 7.9* -- 7.3* -- -- -- -- -- --  HCT 25.8* -- 24.2* -- 26.8* -- -- -- --  PLT 461* -- 460* -- 493* -- -- -- --  APTT -- -- -- -- -- -- -- -- --  LABPROT -- -- -- -- -- -- -- -- --  INR -- -- -- -- -- -- -- -- --  HEPARINUNFRC 0.38 0.39 -- 0.26* -- -- -- -- --  CREATININE -- -- 0.68 -- -- 0.73 0.71 -- --  CKTOTAL -- -- -- -- -- -- 8 8 14   CKMB -- -- -- -- -- -- 1.2 1.2 1.0  TROPONINI -- -- -- -- -- -- <0.30 <0.30 <0.30    Assessment: 66yo male now therapeutic x2 on heparin at 2700 units/hour 0.38<---0.39. Heparin may be consumed by clot burden, which is heavy. Patient now FOBT + with "blood streaks" on wiping per RN, Hgb 7.9<---7.3 but stable, plt excellent at 461.  Goal of Therapy:  Heparin level 0.3-0.5 units/ml   Plan:  1. Continue heparin at 2700 units/hour 2. F/U HL/CBC daily  Latif Nazareno, [place] R PharmD [phone] 09/08/2011,2:48 PM

## 2011-09-08 NOTE — Progress Notes (Signed)
PROGRESS NOTE  Subjective:   Pt is feeling better.  + cough overnight.  Denies CP or SOB   Objective:    Vital Signs:   Temp:  [97.9 F (36.6 C)-99.9 F (37.7 C)] 99.2 F (37.3 C) (06/09 0750) Pulse Rate:  [86-115] 92  (06/09 0400) Resp:  [20-31] 26  (06/09 0400) BP: (99-125)/(46-74) 99/55 mmHg (06/09 0400) SpO2:  [96 %-100 %] 96 % (06/09 0733)  Last BM Date: 09/02/11   24-hour weight change: Weight change:   Weight trends: Filed Weights   08/30/11 0500 09/01/11 0515 09/03/11 0554  Weight: 181 lb 9.6 oz (82.373 kg) 180 lb 1.9 oz (81.7 kg) 182 lb (82.555 kg)    Intake/Output:  06/08 0701 - 06/09 0700 In: 1582.8 [P.O.:480; I.V.:752.8; IV Piggyback:350] Out: 1150 [Urine:1150]     Physical Exam: BP 99/55  Pulse 92  Temp(Src) 99.2 F (37.3 C) (Oral)  Resp 26  Ht 5\' 10"  (1.778 m)  Wt 182 lb (82.555 kg)  BMI 26.11 kg/m2  SpO2 96%  General: Vital signs reviewed and noted. Well-developed, well-nourished, in no acute distress; alert, appropriate and cooperative .  Head: Normocephalic, atraumatic.  Eyes: conjunctivae/corneas clear. PERRL, EOM's intact.   Throat: normal  Neck: Supple. Normal carotids. No JVD  Lungs:  Few rales at the R base, otherwise clear, normal WOB  Heart: Tachy regular rhythm. No murmurs, gallops or rubs  Abdomen:  Soft, non-tender, non-distended with normoactive bowel sounds. No hepatomegaly. No rebound/guarding. No abdominal masses.  Extremities: Distal pedal pulses are 2+ .  No edema.    Neurologic: A&O X3, CN II - XII are grossly intact. Motor strength is 5/5 in the all 4 extremities.  Psych: Responds to questions appropriately with normal affect.  Chest- moderate but very stable hematoma at extraction site  Labs: BMET:  Basename 09/08/11 0527 09/07/11 0510  NA 131* 129*  K 4.1 4.1  CL 94* 92*  CO2 28 27  GLUCOSE 106* 126*  BUN 13 14  CREATININE 0.68 0.73  CALCIUM 8.9 9.2  MG -- --  PHOS -- --    Liver function tests: No  results found for this basename: AST:2,ALT:2,ALKPHOS:2,BILITOT:2,PROT:2,ALBUMIN:2 in the last 72 hours No results found for this basename: LIPASE:2,AMYLASE:2 in the last 72 hours  CBC:  Basename 09/08/11 0527 09/07/11 1407  WBC 9.5 10.7*  NEUTROABS -- --  HGB 7.3* 8.2*  HCT 24.2* 26.8*  MCV 79.9 79.8  PLT 460* 493*    Cardiac Enzymes:  Basename 09/06/11 0410 09/05/11 2322 09/05/11 1830 09/05/11 1335  CKTOTAL 8 8 14 11   CKMB 1.2 1.2 1.0 1.2  TROPONINI <0.30 <0.30 <0.30 <0.30    Coagulation Studies: No results found for this basename: LABPROT:5,INR:5 in the last 72 hours  Other:    Tele:  Sinus tachy, LBBB  Medications:    Infusions:    . sodium chloride 10 mL/hr at 09/06/11 1900  . heparin 2,700 Units/hr (09/08/11 0221)    Scheduled Medications:    . aspirin EC  81 mg Oral Daily  . ceFEPime (MAXIPIME) IV  1 g Intravenous Q8H  . docusate sodium  100 mg Oral BID  . feeding supplement  237 mL Oral BID BM  . fluconazole (DIFLUCAN) IV  400 mg Intravenous Q24H  . furosemide  60 mg Oral Daily  . levalbuterol  0.63 mg Nebulization Q6H  . losartan  25 mg Oral Daily  . metoprolol  75 mg Oral BID  . pantoprazole (PROTONIX) IV  40  mg Intravenous Q12H  . sodium chloride  3 mL Intravenous Q12H  . traZODone  50 mg Oral QHS  . DISCONTD: ciprofloxacin  500 mg Oral BID  . DISCONTD: metoprolol  50 mg Oral BID  . DISCONTD: pantoprazole  40 mg Oral Q1200    Assessment/ Plan:    1.  NSVT- tolerating metoprolol.  I will increase again today to 100mg  BID. maintaing K>3.9 and Mg>1.9  2. Sinus tachycardia- multifactoral  (infection, DVT, illness, anemia) Would likely improve with treatment of profound anemia, but will defer timing of transfusion to primary team.  3.  CHF: / LBBB-   EF 30%.  Continue metoprolol. Cont. Lasix.  He had a cough with ACE inhibitor.  Started on losartan.  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  I think it will take a long time to clear this.  Defer management at this point to ID.  5. DVT:  Agree with heparin.  Caution given anemia  Orders to transfer to stepdown are written. If hemaglobin remains stable, can go back to telemetry  Disposition: plans per Int. Med team.   Length of Stay: 12  Goble Fudala,MD

## 2011-09-08 NOTE — Progress Notes (Addendum)
Medical Student Daily Progress Note  Subjective: Patient says he feels just a little better today. He didn't sleep as well as he wanted to last night 2/2 increased cough and some anxiety. He noticed a difference in the decrease of his clonazepam from 1mg  back to 0.5mg  in regards to his sleep. He requests if we can allow him 1mg  at night to help with sleep/anxiety and 0.5mg  during the day. He is still taking fast and shallow breaths but continues to use his spirometer as he can tolerate. He wants to get out of bed and walk around the hallways whenever he is cleared to do this as well. Otherwise he has no other complaints or changes. He denies any fevers, chills, N/V/D, constipation, changes in bowel/bladder. He is resting while coughing in bed on exam.  Objective: Vital signs in last 24 hours: Filed Vitals:   09/08/11 0204 09/08/11 0400 09/08/11 0733 09/08/11 0750  BP:  99/55    Pulse:  92    Temp:  98 F (36.7 C)  99.2 F (37.3 C)  TempSrc:  Oral  Oral  Resp:  26    Height:      Weight:      SpO2: 100% 96% 96%    Weight change:   Intake/Output Summary (Last 24 hours) at 09/08/11 1101 Last data filed at 09/08/11 0500  Gross per 24 hour  Intake 1246.75 ml  Output   1150 ml  Net  96.75 ml   Physical Exam: BP 99/55  Pulse 92  Temp(Src) 99.2 F (37.3 C) (Oral)  Resp 26  Ht 5\' 10"  (1.778 m)  Wt 82.555 kg (182 lb)  BMI 26.11 kg/m2  SpO2 96% General appearance: cooperative, appears stated age, fatigued and no distress Head: Normocephalic, without obvious abnormality, atraumatic Back: symmetric, no curvature. ROM normal. No CVA tenderness. Lungs: diminished breath sounds RLL and rales RML and dry as usual, but possibly some wet rales around the area too Chest wall: no tenderness Heart: regular rate and rhythm, S1, S2 normal, no murmur, click, rub or gallop Abdomen: soft, non-tender; bowel sounds normal; no masses,  no organomegaly Extremities: extremities normal, atraumatic, no  cyanosis or edema Skin: Skin color, texture, turgor normal. No rashes or lesions or Old pacer pocket Left upper chest healing well (no erythema, tenderness, warmth noted) Lab Results: Basic Metabolic Panel:  Lab 09/08/11 4098 09/07/11 0510 09/04/11 1800  NA 131* 129* --  K 4.1 4.1 --  CL 94* 92* --  CO2 28 27 --  GLUCOSE 106* 126* --  BUN 13 14 --  CREATININE 0.68 0.73 --  CALCIUM 8.9 9.2 --  MG -- -- 2.2  PHOS -- -- --   CBC:  Lab 09/08/11 0527 09/07/11 1407 09/02/11 0437  WBC 9.5 10.7* --  NEUTROABS -- -- 7.3  HGB 7.3* 8.2* --  HCT 24.2* 26.8* --  MCV 79.9 79.8 --  PLT 460* 493* --   Cardiac Enzymes:  Lab 09/06/11 0410 09/05/11 2322 09/05/11 1830  CKTOTAL 8 8 14   CKMB 1.2 1.2 1.0  CKMBINDEX -- -- --  TROPONINI <0.30 <0.30 <0.30   BNP: No results found for this basename: PROBNP:3 in the last 168 hours D-Dimer: No results found for this basename: DDIMER:2 in the last 168 hours CBG: No results found for this basename: GLUCAP:6 in the last 168 hours Hemoglobin A1C: No results found for this basename: HGBA1C in the last 168 hours Fasting Lipid Panel: No results found for this basename: CHOL,HDL,LDLCALC,TRIG,CHOLHDL,LDLDIRECT in  the last 168 hours Thyroid Function Tests: No results found for this basename: TSH,T4TOTAL,FREET4,T3FREE,THYROIDAB in the last 168 hours Coagulation:  Lab 09/04/11 0500  LABPROT 15.3*  INR 1.18   Anemia Panel: No results found for this basename: VITAMINB12,FOLATE,FERRITIN,TIBC,IRON,RETICCTPCT in the last 168 hours Urine Drug Screen: Drugs of Abuse  No results found for this basename: labopia, cocainscrnur, labbenz, amphetmu, thcu, labbarb    Alcohol Level: No results found for this basename: ETH:2 in the last 168 hours Urinalysis: No results found for this basename: COLORURINE:2,APPERANCEUR:2,LABSPEC:2,PHURINE:2,GLUCOSEU:2,HGBUR:2,BILIRUBINUR:2,KETONESUR:2,PROTEINUR:2,UROBILINOGEN:2,NITRITE:2,LEUKOCYTESUR:2 in the last 168  hours  Micro Results: Recent Results (from the past 240 hour(s))  URINE CULTURE     Status: Normal   Collection Time   08/31/11  3:57 PM      Component Value Range Status Comment   Specimen Description URINE, RANDOM   Final    Special Requests NONE   Final    Culture  Setup Time [id number]   Final    Colony Count 85,000 COLONIES/ML   Final    Culture KLEBSIELLA PNEUMONIAE   Final    Report Status 09/03/2011 FINAL   Final    Organism ID, Bacteria KLEBSIELLA PNEUMONIAE   Final   CULTURE, BLOOD (ROUTINE X 2)     Status: Normal   Collection Time   08/31/11  4:07 PM      Component Value Range Status Comment   Specimen Description BLOOD RIGHT HAND   Final    Special Requests BOTTLES DRAWN AEROBIC AND ANAEROBIC 8CC   Final    Culture  Setup Time [id number]   Final    Culture NO GROWTH 5 DAYS   Final    Report Status 09/06/2011 FINAL   Final   CULTURE, BLOOD (ROUTINE X 2)     Status: Normal   Collection Time   08/31/11  4:25 PM      Component Value Range Status Comment   Specimen Description BLOOD RIGHT FOOT   Final    Special Requests BOTTLES DRAWN AEROBIC AND ANAEROBIC 10CC   Final    Culture  Setup Time [id number]   Final    Culture NO GROWTH 5 DAYS   Final    Report Status 09/06/2011 FINAL   Final   MRSA PCR SCREENING     Status: Normal   Collection Time   09/04/11  8:30 PM      Component Value Range Status Comment   MRSA by PCR NEGATIVE  NEGATIVE  Final   TECHNOLOGIST SMEAR REVIEW     Status: Normal   Collection Time   09/07/11  5:10 AM      Component Value Range Status Comment   Path Review     Final    Value: PLATELET CLUMPS NOTED ON SMEAR, COUNT APPEARS ADEQUATE   Studies/Results: No results found. Medications:  I have reviewed the patient's current medications. Anti-infectives     Start     Dose/Rate Route Frequency Ordered Stop   09/07/11 1400   ceFEPIme (MAXIPIME) 1 g in dextrose 5 % 50 mL IVPB        1 g 100 mL/hr over 30 Minutes Intravenous 3 times per day  09/07/11 1327     09/04/11 2000   ciprofloxacin (CIPRO) tablet 500 mg  Status:  Discontinued        500 mg Oral 2 times daily 09/04/11 1410 09/07/11 1306   09/02/11 1400   fluconazole (DIFLUCAN) IVPB 400 mg        400 mg 200 mL/hr  over 60 Minutes Intravenous Every 24 hours 09/02/11 1300     09/02/11 1200   vancomycin (VANCOCIN) 1,250 mg in sodium chloride 0.9 % 250 mL IVPB  Status:  Discontinued        1,250 mg 166.7 mL/hr over 90 Minutes Intravenous Every 12 hours 09/02/11 1001 09/03/11 0947   08/31/11 1600   vancomycin (VANCOCIN) IVPB 1000 mg/200 mL premix  Status:  Discontinued        1,000 mg 200 mL/hr over 60 Minutes Intravenous Every 8 hours 08/31/11 1527 09/02/11 1001   08/31/11 1600   piperacillin-tazobactam (ZOSYN) IVPB 3.375 g  Status:  Discontinued        3.375 g 12.5 mL/hr over 240 Minutes Intravenous 3 times per day 08/31/11 1527 09/04/11 1410   08/27/11 2200   micafungin (MYCAMINE) 150 mg in sodium chloride 0.9 % 100 mL IVPB  Status:  Discontinued        150 mg 100 mL/hr over 1 Hours Intravenous Every 24 hours 08/27/11 2031 09/02/11 1300         Scheduled Meds:   . aspirin EC  81 mg Oral Daily  . ceFEPime (MAXIPIME) IV  1 g Intravenous Q8H  . docusate sodium  100 mg Oral BID  . feeding supplement  237 mL Oral BID BM  . fluconazole (DIFLUCAN) IV  400 mg Intravenous Q24H  . furosemide  60 mg Oral Daily  . levalbuterol  0.63 mg Nebulization Q6H  . losartan  25 mg Oral Daily  . metoprolol  100 mg Oral BID  . pantoprazole (PROTONIX) IV  40 mg Intravenous Q12H  . sodium chloride  3 mL Intravenous Q12H  . traZODone  50 mg Oral QHS  . DISCONTD: ciprofloxacin  500 mg Oral BID  . DISCONTD: metoprolol  75 mg Oral BID  . DISCONTD: pantoprazole  40 mg Oral Q1200   Continuous Infusions:   . sodium chloride 10 mL/hr at 09/06/11 1900  . heparin 2,700 Units/hr (09/08/11 0221)   PRN Meds:.acetaminophen, clonazePAM, ipratropium, levalbuterol, oxyCODONE-acetaminophen,  polyethylene glycol, sodium chloride Assessment/Plan:  Normocytic anemia - Hgb 7.3 today. MCV 79.9. Hgb has been trending down since admission. FOBT test positive x1 after BM (patient was constipated for 4 days). 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 Iron = 21, Sat ratio = 9, TIBC =243, Ferritin = 597, Retic count =1.9, FOBT negative. Smear revealed mild anemia with normal morphology. Side effect of micafungin can be suppression of RBC production (3-10%).  - Per GI: conservative management. Monitor Hgb then endoscopy.  - Will monitor CBC. Transfuse if Hgb <7 and possible Endoscopy   Distal Femoral Vein thrombosis - Patient had LE doppler on 09/03/11 that showed DVT in right femoral vein distally. MRI/MRA head on 09/04/11: see read. Notable small bulge proximal A1 segment of left ACA (not a typical place for mycotic aneurysm however)  - heparin per pharmacy, with target lower end of therapeutic range (he is therapeutic as of today) - Hopefully be able to anticoagulate patient with Coumadin .  - Will monitor closely for any signs of bleeding. If stable no IVC filer will be placed.   #Pleural effusion / pulmonary embolism / pulmonary infarction - Patient is s/p thoracentesis (08/28/11) for R effusion. Most recent CT chest (09/01/11) shows unchanged central right PA Candidal embolus (extension to RML, RLL), and embolus/occlusion of LUL pulmonary artery branches. Moderate right pleural effusion present (possibly loculated). Wedge shaped infarcts noted in RML and LUL.  Pleural fluid  culture: Negative, final / Pleural fluid Fungus culture: in progress x 4 weeks (collected 08/28/11): NTD / Pleural fluid AFB culture w/ smear: in progress x 6 weeks (collected 08/28/11): NTD  - Continue conservative management with pain medication and inhalers  - Continue incentive spirometer to help with expulsion / cough  # Bacteriuria - Patient's urine culture revealed Klebsiella pneumoniae. 85,000 col/mL. Urine  culture pansensitive except for Amoxicillin. D/c zosyn (08/31/11>>09/04/11). Started Cipro on 6/5.  - Needs 10 day total. Per ID recommended to change to Cefepime to complete the course. Cipro higher risk for C.diff.  - Will discontinue Cefepime on 09/09/11 after last dose received  #Acute on chronic systolic CHF 2/2 CAD and ischemic cardiomyopathy -Improved. 2D echo (08/23/11) shows EF = 30% with diffuse hypokinesis, mild LVH, and moderately dilated LA. 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  - Appreciate cardiology consult in the management of this patient  - continue ASA 81 mg daily - metoprolol is increased to 100 mg BID today, per cardiology - continue losartan 25 mg daily, per cardiology  - Continue lasix 60 daily   #NSVT - Patient has had recent episodes of NSVT. Most recent significant episode of 47 beat run over 10 seconds 09/04/11 (none since). Currently on Metoprolol tartrate 100 mg BID  - Continue current regimen  - Goal of K>3.9 and Mg>1.9   #Tachycardia - Patient continues to have episodes of tachycardia (90's -110's). Multifactorial . Currently on metoprolol 100 BID.  - Continue current regimen  - Will continue to monitor   #Fever - resolved; Patient had Candidal embolus (from atrial lead on former ICD) to R pulmonary artery on 08/15/11.  blood cultures x 2 : Negative, final. Change micafungin (08/15/11>>09/01/11) to IV fluconazole 400 mg daily to finish 6 week course (08/15/11>>) since cultures sensitive to micafungin and fluconazole  - Will continue to monitor  - Currently treated for Bacteruria  - Old pacer pocket (no warmth, erythema, tenderness, disproportionate swelling). Will continue to monitor  - Appreciate ID recommendations and consult in the management of this patient   #Hyponatremia - Na = 131 . Likely 2/2 his CHF and removal of his biV ICD.  - will continue to monitor   #GERD / Esophageal Candidiasis - Stable. He does note some minor  discomfort with swallowing, however he also states that the dilation on 08/13/11 was not a complete dilation that he is used to.  - Appreciate Gastroenterology consult in the management of this patient - continue fluconazole  - change Protonix to 40 mg IV BID, per GI (2/2 potential of esophageal oozing and pill malabsorption)   #Anxiety / Insomnia - He has had some anxiety at night when trying to sleep, especially with his cough. - Will give clonazepam 1 mg QHS and 0.5 mg daily - continue trazodone 50 mg QHS  #Insomnia - Stable as long as his nightly anxiety is controlled. 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 On SPEP his IgM and IgA were increased during his last admission, which could likely be due to his acute illnesses. Hematology consult during previous admission reccommended follow-up after his current infection is completely resolved.  - repeat SPEP/UPEP after full resolution of Candidal infection   Disposition - Pending clinical improvement. Will continue close monitoring and transfer to SDU.   LOS: 12 days   This is a Psychologist, occupational  Note.  The care of the patient was discussed with Dr. Almyra Deforest and the assessment and plan formulated with their assistance.  Please see their attached note for official documentation of the daily encounter.  Lewie Chamber 09/08/2011, 11:01 AM   Resident Addendum to Medical Student Note   I have seen and examined the patient, and agree with the the medical student assessment and plan outlined above. Please see my brief note below for additional details.   HPI:  There were no events overnight. Patient feels weaker today. Denies any chest pain or shortness of breath. Reports cough.   OBJECTIVE:  VS:  Reviewed   Meds:  Reviewed   Labs:  Reviewed   Imaging:  Reviewed   Physical Exam:  General:  Vital signs reviewed and noted. Well-developed, well-nourished, in  no acute distress; alert, appropriate and cooperative throughout examination.   Lungs:  Diminished breath sounds at the bases RLL>LLL.   Heart:  RRR. S1 and S2 normal without gallop, murmur, or rubs.   Abdomen:  BS normoactive. Soft, Nondistended, non-tender. No masses or organomegaly.   Extremities:  No pretibial edema.    ASSESSMENT/ PLAN:  Pt is a 66 y.o. yo male who was admitted on 08/27/2011 with symptoms of SOB, which was determined to be secondary to Pleural effusion. Since admission patient had a complicated course . Including:   # Normocytic anemia - Hgb 7.3 today. MCV 79.8. Hgb has been trending down since admission. FOBT test positive x1 after BM ( patient was constipated for 4 days). 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 Iron = 21, Sat ratio = 9, TIBC =243, Ferritin = 597, Retic count =1.9, FOBT negative. Smear revealed mild anemia with normal morphology. Side effect of micafungin can be suppression of RBC production (3-10%).   Per GI: conservative management. Monitor Hgb then endoscopy.  - Will monitor CBG. Transfuse if Hgb <7 and possible Endoscopy   Distal Femoral Vein thrombosis - Patient had LE doppler on 09/03/11 that showed DVT in right femoral vein distally. MRI/MRA head on 09/04/11: see read. Notable small bulge proximal A1 segment of left ACA (not a typical place for mycotic aneurysm however)  - heparin per pharmacy, with target lower end of therapeutic range  - Hopefully be able to anticoagulate patient with Coumadin .  - Will monitor closely for any signs of bleeding. If stable no IVC filer will be placed.   #Pleural effusion / pulmonary embolism / pulmonary infarction - Patient is s/p thoracentesis (08/28/11) for R effusion. Most recent CT chest (09/01/11) shows unchanged central right PA Candidal embolus (extension to RML, RLL), and embolus/occlusion of LUL pulmonary artery branches. Moderate right pleural effusion present (possibly loculated). Wedge shaped infarcts  noted in RML and LUL.  Pleural fluid culture: Negative, final, Pleural fluid Fungus culture: in progress x 4 weeks (collected 08/28/11): NTD, Pleural fluid AFB culture w/ smear: in progress x 6 weeks (collected 08/28/11): NTD  - Continue conservative management with pain medication and inhalers   # Bacteriuria - Patient's urine culture revealed Klebsiella pneumoniae. 85,000 col/mL. Urine culture pansensitive except for Amoxicillin. D/c zosyn (08/31/11>>09/04/11). Started Cipro on 6/5.  - Needs 10 day total. Per ID recommended to change to Cefepime to complete the course. Cipro higher risk for C.diff.   #Acute on chronic systolic CHF 2/2 CAD and ischemic cardiomyopathy -Improved. 2D echo (08/23/11) shows EF = 30% with diffuse hypokinesis, mild LVH, and moderately dilated LA. He is s/p biVent ICD removal on  08/15/11 2/2 Candidal growth on atrial lead. Currently on Aspirin, Metoprolol 75 BID.  - Appreciate cardiology consult in the management of this patient  - Will add losartan 25 mg daily per cardiology  - Continue lasix 60 daily   #NSVT - Patient has had recent episodes of NSVT. Most recent significant episode of 47 beat run over 10 seconds 09/04/11. Currently on metoprolol 75 BID.  - Continue current regimen  - Goal of K>3.9 and Mg>1.9   #Tachycardia - Patient continues to have episodes of tachycardia (90's -110's). Multifactorial . Currently on metoprolol 75 BID.  - Continue current regimen  - Will continue to monitor   #Fever - resolved; Patient had Candidal embolus (from atrial lead on former ICD) to R pulmonary artery on 08/15/11.  blood cultures x 2 : Negative, final. Change micafungin (08/15/11>>09/01/11) to IV fluconazole 400 mg daily to finish 6 week course (08/15/11>>) since cultures sensitive to micafungin and fluconazole  - Will continue to monitor  - Currently treated for Bacteruria  - Old pacer pocket (no warmth, erythema, tenderness, disproportionate swelling). Will continue to monitor  -  Appreciate ID recommendations and consult in the management of this patient   #Hyponatremia - Na = 129 . Likely 2/2 his CHF and removal of his biV ICD.  - will continue to monitor   #GERD / Esophageal Candidiasis - Stable. He does note some minor discomfort with swallowing, however he also states that the dilation on 08/13/11 was not a complete dilation that he is used to.  - continue fluconazole  - continue Protonix 40mg  IV bid per GI  #Anxiety - Improved. Currently on clonazepam 0.5 mg daily and 1 mg at bedtime - Continue current regimen   #Insomnia - Improved.  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 His IgM and IgA were increased during his last admission, which could likely be due to his acute illnesses. Hematology consult during previous admission reccommended follow-up after his current infection is completely resolved.  - repeat SPEP/UPEP after full resolution of Candidal infection   Disposition - Pending clinical improvement. Will continue close monitoring and transfer to SDU.   Length of Stay: 12  Almyra Deforest, MD  Internal Medicine Resident    Internal Medicine Teaching Service Attending Note Date: 09/08/2011  Patient name: NACHMEN MANSEL  Medical record number: [id number]  Date of birth: 04-17-1945    This patient has been seen and discussed with the house staff. Please see their note for complete details. I concur with their findings with the following additions/corrections: Mr Smock continues to improve. He had low grade temperatue in 99 range. His pacer pocket is without fluctuance. His hemolgobin still above the threshold for transfusion. I would continue his cefepime thru tomorrow [phone]) and then dc it. Continue the fluconazole.  Dr. Meredith Pel and Dr. Ninetta Lights back in the am.  Paulette Blanch Dam 09/08/2011, 1:47 PM

## 2011-09-09 ENCOUNTER — Encounter: Payer: Medicare Other | Admitting: Nurse Practitioner

## 2011-09-09 DIAGNOSIS — J90 Pleural effusion, not elsewhere classified: Secondary | ICD-10-CM

## 2011-09-09 DIAGNOSIS — I2690 Septic pulmonary embolism without acute cor pulmonale: Secondary | ICD-10-CM

## 2011-09-09 DIAGNOSIS — D649 Anemia, unspecified: Secondary | ICD-10-CM

## 2011-09-09 DIAGNOSIS — I82819 Embolism and thrombosis of superficial veins of unspecified lower extremities: Secondary | ICD-10-CM

## 2011-09-09 LAB — CBC
Hemoglobin: 7.6 g/dL — ABNORMAL LOW (ref 13.0–17.0)
MCV: 79.9 fL (ref 78.0–100.0)
Platelets: 406 10*3/uL — ABNORMAL HIGH (ref 150–400)
RBC: 3.14 MIL/uL — ABNORMAL LOW (ref 4.22–5.81)
WBC: 8.3 10*3/uL (ref 4.0–10.5)

## 2011-09-09 MED ORDER — WARFARIN - PHARMACIST DOSING INPATIENT
Freq: Every day | Status: DC
  Administered 2011-09-10 – 2011-09-11 (×3)

## 2011-09-09 MED ORDER — WARFARIN SODIUM 7.5 MG PO TABS
7.5000 mg | ORAL_TABLET | Freq: Once | ORAL | Status: DC
  Filled 2011-09-09: qty 1

## 2011-09-09 MED ORDER — CEFAZOLIN SODIUM-DEXTROSE 2-3 GM-% IV SOLR
2.0000 g | Freq: Three times a day (TID) | INTRAVENOUS | Status: DC
  Filled 2011-09-09 (×2): qty 50

## 2011-09-09 MED ORDER — ENSURE COMPLETE PO LIQD
237.0000 mL | Freq: Three times a day (TID) | ORAL | Status: DC
  Administered 2011-09-09 – 2011-09-14 (×12): 237 mL via ORAL

## 2011-09-09 MED ORDER — PATIENT'S GUIDE TO USING COUMADIN BOOK
Freq: Once | Status: AC
  Administered 2011-09-09: 18:00:00
  Filled 2011-09-09: qty 1

## 2011-09-09 MED ORDER — WARFARIN SODIUM 5 MG PO TABS
5.0000 mg | ORAL_TABLET | Freq: Once | ORAL | Status: AC
  Administered 2011-09-09: 5 mg via ORAL
  Filled 2011-09-09 (×2): qty 1

## 2011-09-09 MED ORDER — COUMADIN BOOK
Freq: Once | Status: AC
  Administered 2011-09-09: 18:00:00
  Filled 2011-09-09: qty 1

## 2011-09-09 MED ORDER — WARFARIN VIDEO
Freq: Once | Status: AC
  Administered 2011-09-09: 18:00:00

## 2011-09-09 NOTE — Progress Notes (Signed)
Stable hemoglobin noted.  Will follow labs and hospital course at a distance.  Please call with any questions.

## 2011-09-09 NOTE — Progress Notes (Signed)
Physical Therapy Treatment Patient Details Name: [name] MRN: [id number] DOB: October 18, 1945 Today's Date: 09/09/2011 Time: 4098-1191 PT Time Calculation (min): 23 min  PT Assessment / Plan / Recommendation Comments on Treatment Session  Pt encouraged by how he felt with ambulation today, had no difficulty with legs giving way and able to tolerate increased distance.  Will continue to progress activity.    Follow Up Recommendations  Home health PT;Supervision for mobility/OOB    Barriers to Discharge        Equipment Recommendations  None recommended by PT    Recommendations for Other Services OT consult  Frequency Min 3X/week   Plan Discharge plan remains appropriate;Frequency remains appropriate    Precautions / Restrictions Precautions Precautions: Fall Restrictions Weight Bearing Restrictions: No   Pertinent Vitals/Pain BP 116/61, O2 sats 97%, HR 85 (all after ambulation)    Mobility  Bed Mobility Bed Mobility: Not assessed Transfers Transfers: Sit to Stand;Stand to Sit Sit to Stand: With upper extremity assist;From chair/3-in-1;5: Supervision;With armrests Stand to Sit: 6: Modified independent (Device/Increase time);To chair/3-in-1;With upper extremity assist;With armrests Details for Transfer Assistance: Pt showed improved ability to get wt fwd sit sit to stand, no posterior bias and no LOB Ambulation/Gait Ambulation/Gait Assistance: 4: Min guard Ambulation Distance (Feet): 150 Feet Assistive device: Rolling walker Ambulation/Gait Assistance Details: pt took 3 standing rest breaks during ambulation, about 20 sec each, HR 92 with ambulation.  Pt with increased tension traps while walking but has difficulty relaxing this, reports he has always had "high shoulders" Gait Pattern: Within Functional Limits Gait velocity: decreased Stairs: No Wheelchair Mobility Wheelchair Mobility: No    Exercises General Exercises - Lower Extremity Ankle Circles/Pumps: AROM;15  reps;Both;Seated Straight Leg Raises: AROM;Both;10 reps;Seated Hip Flexion/Marching: AROM;Seated;10 reps;Both   PT Diagnosis:    PT Problem List:   PT Treatment Interventions:     PT Goals Acute Rehab PT Goals PT Goal Formulation: With patient Time For Goal Achievement: 09/20/11 Potential to Achieve Goals: Good Pt will go Sit to Stand: with supervision;with upper extremity assist PT Goal: Sit to Stand - Progress: Met Pt will go Stand to Sit: with supervision;with upper extremity assist PT Goal: Stand to Sit - Progress: Met Pt will Transfer Bed to Chair/Chair to Bed: with supervision Pt will Stand: 1 - 2 min;with unilateral upper extremity support;with supervision Pt will Ambulate: 51 - 150 feet;with supervision;with least restrictive assistive device PT Goal: Ambulate - Progress: Progressing toward goal Pt will Perform Home Exercise Program: Independently PT Goal: Perform Home Exercise Program - Progress: Progressing toward goal  Visit Information  Last PT Received On: 09/09/11 Assistance Needed: +1    Subjective Data  Subjective: I wish I could walk more Patient Stated Goal: return home   Cognition  Overall Cognitive Status: Appears within functional limits for tasks assessed/performed Arousal/Alertness: Awake/alert Orientation Level: Appears intact for tasks assessed Behavior During Session: [facility] for tasks performed    Balance  Balance Balance Assessed: Yes Static Standing Balance Static Standing - Balance Support: Bilateral upper extremity supported Static Standing - Level of Assistance: 6: Modified independent (Device/Increase time) Dynamic Standing Balance Dynamic Standing - Balance Support: During functional activity;Bilateral upper extremity supported Dynamic Standing - Level of Assistance: 5: Stand by assistance  End of Session PT - End of Session Equipment Utilized During Treatment: Gait belt Activity Tolerance: Patient tolerated treatment well Patient left:  in chair;with call bell/phone within reach;with family/visitor present Nurse Communication: Mobility status  Lyanne Co, PT  Acute Rehab Services  [phone]   Jesalyn Finazzo, [place] 09/09/2011, 2:10 PM

## 2011-09-09 NOTE — Progress Notes (Signed)
Internal Medicine Attending  Date: 09/09/2011  Patient name: [name] Medical record number: [id number] Date of birth: 12-28-1945 Age: 66 y.o. Gender: male  I saw and evaluated the patient and discussed his care on a.m. rounds with house staff.  He is in good spirits and has no new complaints.  Patient is afebrile; exam is notable for bibasilar crackles right greater than left; regular rhythm, no extra sounds or murmurs; soft nontender abdomen; no lower extremity edema.  Labs are notable for hemoglobin of 7.6 which is stable.  Plans include continue fluconazole for Candidal endocarditis; complete course of antibiotics for urinary tract infection; follow hemoglobin closely since patient is on anticoagulation for DVT and transfuse if indicated; continue current cardiac regimen and monitoring; mobilize as able with OT/PT.

## 2011-09-09 NOTE — Progress Notes (Signed)
Medical Student Daily Progress Note  Subjective: Patient slept very well last night thanks to his increase of clonazepam to 1 mg qhs and is not drowsy/sleept this morning either. His spirits overall continue to remain very high and he continues to look forward to eventually getting well and going home. He does understand he is a long way away from discharge but his hopes are high nonetheless. His appetite remains strong and he has eaten most of his recent meals. He denies any change in bowel or bladder habits and does not have any constipation, N/V/D, chills, night sweats, fevers. He is sitting up in bed eating breakfast on exam with his wife bedside.  Objective: Vital signs in last 24 hours: Filed Vitals:   09/09/11 0500 09/09/11 0600 09/09/11 0700 09/09/11 0825  BP: 112/60 102/64 103/60   Pulse: 91 97 106   Temp:      TempSrc:      Resp: 30 32 24   Height:      Weight:      SpO2: 97% 97% 100% 100%   Weight change:   Intake/Output Summary (Last 24 hours) at 09/09/11 0829 Last data filed at 09/09/11 0700  Gross per 24 hour  Intake   1546 ml  Output   1325 ml  Net    221 ml   Physical Exam: BP 103/60  Pulse 106  Temp(Src) 99.5 F (37.5 C) (Oral)  Resp 24  Ht 5\' 10"  (1.778 m)  Wt 82.555 kg (182 lb)  BMI 26.11 kg/m2  SpO2 100% General appearance: alert, cooperative and no distress Head: Normocephalic, without obvious abnormality, atraumatic Throat: lips, mucosa, and tongue normal; teeth and gums normal Back: symmetric, no curvature. ROM normal. No CVA tenderness. Lungs: diminished breath sounds RLL and rales dry RML Chest wall: no tenderness Heart: regular rate and rhythm, S1, S2 normal, no murmur, click, rub or gallop Abdomen: soft, non-tender; bowel sounds normal; no masses,  no organomegaly Extremities: extremities normal, atraumatic, no cyanosis or edema Skin: Skin color, texture, turgor normal. No rashes or lesions or old pacer pocket left upper chest (no rubor, dolor,  calor, tumor) Lab Results: Basic Metabolic Panel:  Lab 09/08/11 1610 09/07/11 0510 09/04/11 1800  NA 131* 129* --  K 4.1 4.1 --  CL 94* 92* --  CO2 28 27 --  GLUCOSE 106* 126* --  BUN 13 14 --  CREATININE 0.68 0.73 --  CALCIUM 8.9 9.2 --  MG -- -- 2.2  PHOS -- -- --   Liver Function Tests: No results found for this basename: AST:2,ALT:2,ALKPHOS:2,BILITOT:2,PROT:2,ALBUMIN:2 in the last 168 hours No results found for this basename: LIPASE:2,AMYLASE:2 in the last 168 hours No results found for this basename: AMMONIA:2 in the last 168 hours CBC:  Lab 09/09/11 0615 09/08/11 1357  WBC 8.3 8.6  NEUTROABS -- --  HGB 7.6* 7.9*  HCT 25.1* 25.8*  MCV 79.9 80.9  PLT 406* 461*   Cardiac Enzymes:  Lab 09/06/11 0410 09/05/11 2322 09/05/11 1830  CKTOTAL 8 8 14   CKMB 1.2 1.2 1.0  CKMBINDEX -- -- --  TROPONINI <0.30 <0.30 <0.30   BNP: No results found for this basename: PROBNP:3 in the last 168 hours D-Dimer: No results found for this basename: DDIMER:2 in the last 168 hours CBG: No results found for this basename: GLUCAP:6 in the last 168 hours Hemoglobin A1C: No results found for this basename: HGBA1C in the last 168 hours Fasting Lipid Panel: No results found for this basename: CHOL,HDL,LDLCALC,TRIG,CHOLHDL,LDLDIRECT in the  last 168 hours Thyroid Function Tests: No results found for this basename: TSH,T4TOTAL,FREET4,T3FREE,THYROIDAB in the last 168 hours Coagulation:  Lab 09/04/11 0500  LABPROT 15.3*  INR 1.18   Anemia Panel: No results found for this basename: VITAMINB12,FOLATE,FERRITIN,TIBC,IRON,RETICCTPCT in the last 168 hours Urine Drug Screen: Drugs of Abuse  No results found for this basename: labopia, cocainscrnur, labbenz, amphetmu, thcu, labbarb    Alcohol Level: No results found for this basename: ETH:2 in the last 168 hours Urinalysis: No results found for this basename:  COLORURINE:2,APPERANCEUR:2,LABSPEC:2,PHURINE:2,GLUCOSEU:2,HGBUR:2,BILIRUBINUR:2,KETONESUR:2,PROTEINUR:2,UROBILINOGEN:2,NITRITE:2,LEUKOCYTESUR:2 in the last 168 hours  Micro Results: Recent Results (from the past 240 hour(s))  URINE CULTURE     Status: Normal   Collection Time   08/31/11  3:57 PM      Component Value Range Status Comment   Specimen Description URINE, RANDOM   Final    Special Requests NONE   Final    Culture  Setup Time [id number]   Final    Colony Count 85,000 COLONIES/ML   Final    Culture KLEBSIELLA PNEUMONIAE   Final    Report Status 09/03/2011 FINAL   Final    Organism ID, Bacteria KLEBSIELLA PNEUMONIAE   Final   CULTURE, BLOOD (ROUTINE X 2)     Status: Normal   Collection Time   08/31/11  4:07 PM      Component Value Range Status Comment   Specimen Description BLOOD RIGHT HAND   Final    Special Requests BOTTLES DRAWN AEROBIC AND ANAEROBIC 8CC   Final    Culture  Setup Time [id number]   Final    Culture NO GROWTH 5 DAYS   Final    Report Status 09/06/2011 FINAL   Final   CULTURE, BLOOD (ROUTINE X 2)     Status: Normal   Collection Time   08/31/11  4:25 PM      Component Value Range Status Comment   Specimen Description BLOOD RIGHT FOOT   Final    Special Requests BOTTLES DRAWN AEROBIC AND ANAEROBIC 10CC   Final    Culture  Setup Time [id number]   Final    Culture NO GROWTH 5 DAYS   Final    Report Status 09/06/2011 FINAL   Final   MRSA PCR SCREENING     Status: Normal   Collection Time   09/04/11  8:30 PM      Component Value Range Status Comment   MRSA by PCR NEGATIVE  NEGATIVE  Final   TECHNOLOGIST SMEAR REVIEW     Status: Normal   Collection Time   09/07/11  5:10 AM      Component Value Range Status Comment   Path Review     Final    Value: PLATELET CLUMPS NOTED ON SMEAR, COUNT APPEARS ADEQUATE   Studies/Results: No results found. Medications:  I have reviewed the patient's current medications. Prior to Admission:  Prescriptions prior to  admission  Medication Sig Dispense Refill  . acetaminophen (TYLENOL) 325 MG tablet Take 325-650 mg by mouth every 4 (four) hours as needed. For pain/fever      . Ascorbic Acid (VITAMIN C) 100 MG tablet Take 100 mg by mouth daily.      [name] aspirin EC 81 MG EC tablet Take 1 tablet (81 mg total) by mouth daily.      . calcium carbonate (TUMS - DOSED IN MG ELEMENTAL CALCIUM) 500 MG chewable tablet Chew 1 tablet by mouth 3 (three) times daily.      . clonazePAM (KLONOPIN) 0.5  MG tablet Take 1 tablet (0.5 mg total) by mouth 2 (two) times daily as needed for anxiety.  30 tablet  1  . esomeprazole (NEXIUM) 40 MG capsule Take 40 mg by mouth 2 (two) times daily.       . famotidine (PEPCID) 10 MG tablet Take 10 mg by mouth 2 (two) times daily.      . feeding supplement (ENSURE COMPLETE) LIQD Take 237 mLs by mouth 2 (two) times daily between meals.      . Flaxseed, Linseed, 1000 MG CAPS Take 1 capsule (1,000 mg total) by mouth daily.      . furosemide (LASIX) 40 MG tablet Take 1 tablet (40 mg total) by mouth daily.  30 tablet  3  . metoprolol (LOPRESSOR) 50 MG tablet Take 0.5 tablets (25 mg total) by mouth 2 (two) times daily.  30 tablet  1  . Multiple Vitamins-Minerals (MULTIVITAMIN) tablet Take 1 tablet by mouth daily with breakfast.   30 tablet    . oxyCODONE-acetaminophen (PERCOCET) 5-325 MG per tablet Take 1 tablet by mouth every 6 (six) hours as needed for pain. Do not take more than 4000 mg of acetaminophen in 24 hours. Both Tylenol and Percocet contain acetaminophen.  32 tablet  0  . sodium chloride 0.9 % SOLN 100 mL with micafungin 50 MG SOLR 150 mg Inject 150 mg into the vein daily. Dispense quantity sufficient to treat until on 09/26/2011. Patient is to receive Micafungin via PICC line daily. Via home health agency. PROTECT FROM LIGHT - DO NOT SHAKE  34 application  0  . DISCONTD: acetaminophen (TYLENOL) 325 MG tablet Take 1-2 tablets (325-650 mg total) by mouth every 4 (four) hours as needed.      .  polyethylene glycol (MIRALAX / GLYCOLAX) packet Take 17 g by mouth daily as needed.  14 each  11   Anti-infectives     Start     Dose/Rate Route Frequency Ordered Stop   09/07/11 1400   ceFEPIme (MAXIPIME) 1 g in dextrose 5 % 50 mL IVPB        1 g 100 mL/hr over 30 Minutes Intravenous 3 times per day 09/07/11 1327     09/04/11 2000   ciprofloxacin (CIPRO) tablet 500 mg  Status:  Discontinued        500 mg Oral 2 times daily 09/04/11 1410 09/07/11 1306   09/02/11 1400   fluconazole (DIFLUCAN) IVPB 400 mg        400 mg 200 mL/hr over 60 Minutes Intravenous Every 24 hours 09/02/11 1300     09/02/11 1200   vancomycin (VANCOCIN) 1,250 mg in sodium chloride 0.9 % 250 mL IVPB  Status:  Discontinued        1,250 mg 166.7 mL/hr over 90 Minutes Intravenous Every 12 hours 09/02/11 1001 09/03/11 0947   08/31/11 1600   vancomycin (VANCOCIN) IVPB 1000 mg/200 mL premix  Status:  Discontinued        1,000 mg 200 mL/hr over 60 Minutes Intravenous Every 8 hours 08/31/11 1527 09/02/11 1001   08/31/11 1600   piperacillin-tazobactam (ZOSYN) IVPB 3.375 g  Status:  Discontinued        3.375 g 12.5 mL/hr over 240 Minutes Intravenous 3 times per day 08/31/11 1527 09/04/11 1410   08/27/11 2200   micafungin (MYCAMINE) 150 mg in sodium chloride 0.9 % 100 mL IVPB  Status:  Discontinued        150 mg 100 mL/hr over 1 Hours Intravenous Every  24 hours 08/27/11 2031 09/02/11 1300         Scheduled Meds:   . aspirin EC  81 mg Oral Daily  . ceFEPime (MAXIPIME) IV  1 g Intravenous Q8H  . clonazePAM  0.5 mg Oral Daily  . clonazePAM  1 mg Oral QHS  . docusate sodium  100 mg Oral BID  . feeding supplement  237 mL Oral BID BM  . fluconazole (DIFLUCAN) IV  400 mg Intravenous Q24H  . furosemide  60 mg Oral Daily  . levalbuterol  0.63 mg Nebulization Q6H  . losartan  25 mg Oral Daily  . metoprolol  100 mg Oral BID  . pantoprazole (PROTONIX) IV  40 mg Intravenous Q12H  . sodium chloride  3 mL Intravenous Q12H    . traZODone  50 mg Oral QHS   Continuous Infusions:   . sodium chloride 10 mL/hr at 09/06/11 1900  . heparin 2,700 Units/hr (09/09/11 0822)   PRN Meds:.acetaminophen, ipratropium, levalbuterol, oxyCODONE-acetaminophen, polyethylene glycol, sodium chloride, DISCONTD: clonazePAM Assessment/Plan:  Normocytic anemia - Hgb 7.6 today. MCV 79.9. Hgb has been trending down since admission. FOBT test positive x1 after BM (patient was constipated for 4 days). 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 Iron = 21, Sat ratio = 9, TIBC =243, Ferritin = 597, Retic count =1.9, FOBT negative. Smear revealed mild anemia with normal morphology. Side effect of micafungin can be suppression of RBC production (3-10%).  - Appreciate GI consult in the management of this patient - Per GI: conservative management. Monitor Hgb then endoscopy  - will check ferritin today - Will monitor CBC. Transfuse if Hgb <7 and possible Endoscopy   Distal Femoral Vein thrombosis - Patient had LE doppler on 09/03/11 that showed DVT in right femoral vein distally. MRI/MRA head on 09/04/11: see read. Notable small bulge proximal A1 segment of left ACA (not a typical place for mycotic aneurysm however)  - heparin per pharmacy, with target lower end of therapeutic range (he is therapeutic as of 09/08/11) - will start coumadin per pharmacy  - Will monitor closely for any signs of bleeding. If stable no IVC filer will be placed.   #Pleural effusion / pulmonary embolism / pulmonary infarction - Patient is s/p thoracentesis (08/28/11) for R effusion. Most recent CT chest (09/01/11) shows unchanged central right PA Candidal embolus (extension to RML, RLL), and embolus/occlusion of LUL pulmonary artery branches. Moderate right pleural effusion present (possibly loculated). Wedge shaped infarcts noted in RML and LUL.  Pleural fluid culture: Negative, final / Pleural fluid Fungus culture: in progress x 4 weeks (collected 08/28/11): NTD / Pleural  fluid AFB culture w/ smear: in progress x 6 weeks (collected 08/28/11): NTD  - Continue conservative management with pain medication and inhalers  - Continue incentive spirometer to help with expulsion / cough   # Bacteriuria - Patient's urine culture revealed Klebsiella pneumoniae. 85,000 col/mL. Urine culture pansensitive except for Amoxicillin. D/c zosyn (08/31/11>>09/04/11). Started Cipro on 6/5.  - Needs 10 day total. Per ID recommended to change to Cefepime to complete the course. Cipro higher risk for C.diff.  - Will discontinue Cefepime on 09/09/11 after last dose received   #Acute on chronic systolic CHF 2/2 CAD and ischemic cardiomyopathy -Improved. 2D echo (08/23/11) shows EF = 30% with diffuse hypokinesis, mild LVH, and moderately dilated LA. 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  - Appreciate cardiology consult in the management of this patient  -  continue ASA 81 mg daily  - continue metoprolol 100 mg BID  - continue losartan 25 mg daily  - Continue lasix 60 daily   #NSVT - Patient has had recent episodes of NSVT. Most recent significant episode of 47 beat run over 10 seconds 09/04/11 (none since). Currently on Metoprolol tartrate 100 mg BID  - Continue current regimen  - Goal of K>3.9 and Mg>1.9   #Tachycardia - Patient continues to have episodes of tachycardia (90's -110's). Multifactorial . Currently on metoprolol 100 BID.  - Continue current regimen  - Will continue to monitor   #Fever - resolved; Patient had Candidal embolus (from atrial lead on former ICD) to R pulmonary artery on 08/15/11.  blood cultures x 2 : Negative, final. Change micafungin (08/15/11>>09/01/11) to IV fluconazole 400 mg daily to finish 6 week course (08/15/11>>) since cultures sensitive to micafungin and fluconazole  - Will continue to monitor  - Currently treated for Bacteruria  - Old pacer pocket (no warmth, erythema, tenderness, disproportionate swelling). Will continue to  monitor  - Appreciate ID recommendations and consult in the management of this patient   #Hyponatremia - Na =131 yesterday  . Likely 2/2 his CHF and removal of his biV ICD.  - will continue to monitor   #GERD / Esophageal Candidiasis - Stable. He does note some minor discomfort with swallowing, however he also states that the dilation on 08/13/11 was not a complete dilation that he is used to.  - Appreciate Gastroenterology consult in the management of this patient  - continue fluconazole  - continue Protonix 40 mg IV BID, per GI (2/2 potential of esophageal oozing and pill malabsorption)   #Anxiety / Insomnia - He has had some anxiety at night when trying to sleep, especially with his cough.  - Will give clonazepam 1 mg QHS and 0.5 mg daily  - continue trazodone 50 mg QHS   #Insomnia - Stable as long as his nightly anxiety is controlled.  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 On SPEP his IgM and IgA were increased during his last admission, which could likely be due to his acute illnesses. Hematology consult during previous admission reccommended follow-up after his current infection is completely resolved.  - repeat SPEP/UPEP after full resolution of Candidal infection    LOS: 13 days   This is a Psychologist, occupational Note.  The care of the patient was discussed with Dr. Margorie John and the assessment and plan formulated with their assistance.  Please see their attached note for official documentation of the daily encounter.  Lewie Chamber 09/09/2011, 8:29 AM   Resident Addendum to Medical Student Note   I have seen and examined the patient, and agree with the the medical student assessment and plan outlined above. Please see my brief note below for additional details.  HPI: Patient slept very well and appears to be in good spirits.   OBJECTIVE: VS: Reviewed  Meds: Reviewed  Labs: Reviewed    Imaging: Reviewed   Physical Exam: General: elderly male resting in bed, appears well HEENT: PERRL, EOMI, no scleral icterus Cardiac: RRR, no rubs, murmurs or gallops Pulm: decreased breath sounds RLL, moving normal volumes of air Abd: soft, nontender, nondistended, BS present Ext: warm and well perfused, no pedal edema Neuro: alert and oriented X3, cranial nerves II-XII grossly intact   ASSESSMENT/ PLAN:  Will monitor patient's hemoglobin, positive hemoccult likely due  from bleeding from known hemorroids since positive only one time with several negative results.  Dr. Ninetta Lights recommends checking blood cultures once he finishes IV fluconazole and to continue oral fluconazole indefinitely.  Length of Stay: 70   Margorie John, M.D. [facility], Internal Medicine Resident 09/10/2011, 2:17 PM

## 2011-09-09 NOTE — Progress Notes (Addendum)
ANTICOAGULATION CONSULT NOTE - Follow Up Consult  Pharmacy Consult for heparin Indication: DVT and septic lung emboli  Labs:  Basename 09/09/11 0615 09/08/11 1357 09/08/11 0848 09/08/11 0527 09/07/11 0510  HGB 7.6* 7.9* -- -- --  HCT 25.1* 25.8* -- 24.2* --  PLT 406* 461* -- 460* --  APTT -- -- -- -- --  LABPROT -- -- -- -- --  INR -- -- -- -- --  HEPARINUNFRC 0.38 0.38 0.39 -- --  CREATININE -- -- -- 0.68 0.73  CKTOTAL -- -- -- -- --  CKMB -- -- -- -- --  TROPONINI -- -- -- -- --    Assessment: 65yo male now therapeutic (HL= 0.38) on heparin at 2700 units/hour) LE doppler on 09/03/11 that showed DVT in right femoral vein distally). Hg=7.6 and low but stable. Patient also to start coumadin (baseline INR=1.32 on 5/28) and noted on fluconazole (significant interaction with coumadin)  Goal of Therapy:  Heparin level 0.3-0.5 units/ml Platlet monitoring per protocol: yes   Plan:  1. Continue heparin at 2700 units/hour 2. Coumadin 5mg  po x1 today 3. PT/INR, CBC and heparin levels daily  Benny Lennert PharmD 09/09/2011,8:29 AM

## 2011-09-09 NOTE — Progress Notes (Signed)
Nutrition Follow-up  Patient with some episodes of non-sustained ventricular tachycardia; transferred back to 2900. States his appetite is improving; PO intake variable at 25-50% per patient; doesn't usually consume his entree. Drinking Ensure Complete supplements 3 times daily.  Diet Order:  Regular  Meds: Scheduled Meds:   . aspirin EC  81 mg Oral Daily  .  ceFAZolin (ANCEF) IV  2 g Intravenous Q8H  . clonazePAM  0.5 mg Oral Daily  . clonazePAM  1 mg Oral QHS  . docusate sodium  100 mg Oral BID  . feeding supplement  237 mL Oral BID BM  . fluconazole (DIFLUCAN) IV  400 mg Intravenous Q24H  . furosemide  60 mg Oral Daily  . levalbuterol  0.63 mg Nebulization Q6H  . losartan  25 mg Oral Daily  . metoprolol  100 mg Oral BID  . pantoprazole (PROTONIX) IV  40 mg Intravenous Q12H  . sodium chloride  3 mL Intravenous Q12H  . traZODone  50 mg Oral QHS  . DISCONTD: ceFEPime (MAXIPIME) IV  1 g Intravenous Q8H   Continuous Infusions:   . sodium chloride 10 mL/hr at 09/06/11 1900  . heparin 2,700 Units/hr (09/09/11 0822)   PRN Meds:.acetaminophen, ipratropium, levalbuterol, oxyCODONE-acetaminophen, polyethylene glycol, sodium chloride, DISCONTD: clonazePAM  Labs:  CMP     Component Value Date/Time   NA 131* 09/08/2011 0527   K 4.1 09/08/2011 0527   CL 94* 09/08/2011 0527   CO2 28 09/08/2011 0527   GLUCOSE 106* 09/08/2011 0527   BUN 13 09/08/2011 0527   CREATININE 0.68 09/08/2011 0527   CREATININE 1.10 07/29/2011 1711   CALCIUM 8.9 09/08/2011 0527   PROT 7.8 08/31/2011 1631   ALBUMIN 2.0* 08/31/2011 1631   AST 36 08/31/2011 1631   ALT 21 08/31/2011 1631   ALKPHOS 155* 08/31/2011 1631   BILITOT 0.2* 08/31/2011 1631   GFRNONAA >90 09/08/2011 0527   GFRAA >90 09/08/2011 0527     Intake/Output Summary (Last 24 hours) at 09/09/11 1307 Last data filed at 09/09/11 1200  Gross per 24 hour  Intake   1823 ml  Output   1425 ml  Net    398 ml    Weight Status:  82.5 kg (6/4) -- no new weight  available  Re-estimated needs:  2000-2200 kcals, 100-110 gm protein  Nutrition Dx:  Unintended Weight Loss, ongoing  Goal:  Oral intake to meet >90% of estimated nutrition needs, progressing Monitor: PO & supplemental intake, weight, labs, I/O's  Intervention:    Will change frequency of Ensure Complete to TID  Continue MVI PO daily  RD to follow for nutrition care plan  Alger Memos Pager #:  [phone]

## 2011-09-09 NOTE — Progress Notes (Signed)
INFECTIOUS DISEASE PROGRESS NOTE  ID: [name] is a 66 y.o. 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 Also has concurrent bacturia with pan sensitive kleb pneumonia.  Subjective: Afebrile, had episode NSVT overnight. No fever,chills, nightsweats. Worked with pt without difficulty. Still has nonproductive cough and mild dysphagia  Abtx: day #6 for UTI Cefepime Day #2, started 6/8 cipro 6/05-6/08  Day #7 fluconazole Previously on micafungin  Medications:     . aspirin EC  81 mg Oral Daily  . ceFEPime (MAXIPIME) IV  1 g Intravenous Q8H  . clonazePAM  0.5 mg Oral Daily  . clonazePAM  1 mg Oral QHS  . docusate sodium  100 mg Oral BID  . feeding supplement  237 mL Oral BID BM  . fluconazole (DIFLUCAN) IV  400 mg Intravenous Q24H  . furosemide  60 mg Oral Daily  . levalbuterol  0.63 mg Nebulization Q6H  . losartan  25 mg Oral Daily  . metoprolol  100 mg Oral BID  . pantoprazole (PROTONIX) IV  40 mg Intravenous Q12H  . sodium chloride  3 mL Intravenous Q12H  . traZODone  50 mg Oral QHS     Objective: Vital signs in last 24 hours: Temp:  [98.3 F (36.8 C)-100 F (37.8 C)] 98.6 F (37 C) (06/10 0800) Pulse Rate:  [81-107] 99  (06/10 0900) Resp:  [22-37] 26  (06/10 0900) BP: (79-120)/(45-83) 107/57 mmHg (06/10 0900) SpO2:  [95 %-100 %] 96 % (06/10 0900) Gen: Fatigued, pale appearing man in no acute distress; alert, appropriate and cooperative throughout examination. Head: Normocephalic, atraumatic.well healing surgical incision in the right temple.  Eyes: PERRL, EOMI, No signs of anemia or jaundince.  Throat: Oropharynx nonerythematous, no exudate appreciated.  Neck: Supple with no deformities, masses, or tenderness noted. No carotid Bruits, no JVD. Lungs: Normal respiratory effort. Clear to auscultation BL, without  wheezes. Bibasilar inspiratory crackles.  Heart: Tachycardic with occasional extra beats. S1 and S2 normal without murmur, gallop,or rubs. Abdomen: BS normoactive. Soft, nondistended, non-tender. No masses or organomegaly. MSK/Extremities: No pretibial edema.the patient does have deformity and wasting in the left hand status post crush injury many years ago  lymphaticno anterior/posterior cervical, axillary, or inguinal lymphadenopathy  Skin: No visible rashes. Vitilligo present on both hands. Angular chelitis present in the bilateral corners of the mouth. Right arm PICC c/d/i Psych: mood and affect are normal.    Lab Results  Basename 09/09/11 0615 09/08/11 1357 09/08/11 0527 09/07/11 0510  WBC 8.3 8.6 -- --  HGB 7.6* 7.9* -- --  HCT 25.1* 25.8* -- --  NA -- -- 131* 129*  K -- -- 4.1 4.1  CL -- -- 94* 92*  CO2 -- -- 28 27  BUN -- -- 13 14  CREATININE -- -- 0.68 0.73  GLU -- -- -- --    Microbiology: Recent Results (from the past 240 hour(s))  URINE CULTURE     Status: Normal   Collection Time   08/31/11  3:57 PM      Component Value Range Status Comment   Specimen Description URINE, RANDOM   Final    Special Requests NONE   Final    Culture  Setup Time [id number]   Final    Colony Count 85,000 COLONIES/ML   Final    Culture KLEBSIELLA PNEUMONIAE   Final    Report Status 09/03/2011 FINAL  Final    Organism ID, Bacteria KLEBSIELLA PNEUMONIAE   Final   CULTURE, BLOOD (ROUTINE X 2)     Status: Normal   Collection Time   08/31/11  4:07 PM      Component Value Range Status Comment   Specimen Description BLOOD RIGHT HAND   Final    Special Requests BOTTLES DRAWN AEROBIC AND ANAEROBIC 8CC   Final    Culture  Setup Time [id number]   Final    Culture NO GROWTH 5 DAYS   Final    Report Status 09/06/2011 FINAL   Final   CULTURE, BLOOD (ROUTINE X 2)     Status: Normal   Collection Time   08/31/11  4:25 PM      Component Value Range Status Comment   Specimen Description BLOOD  RIGHT FOOT   Final    Special Requests BOTTLES DRAWN AEROBIC AND ANAEROBIC 10CC   Final    Culture  Setup Time [id number]   Final    Culture NO GROWTH 5 DAYS   Final    Report Status 09/06/2011 FINAL   Final   MRSA PCR SCREENING     Status: Normal   Collection Time   09/04/11  8:30 PM      Component Value Range Status Comment   MRSA by PCR NEGATIVE  NEGATIVE  Final   TECHNOLOGIST SMEAR REVIEW     Status: Normal   Collection Time   09/07/11  5:10 AM      Component Value Range Status Comment   Path Review     Final    Value: PLATELET CLUMPS NOTED ON SMEAR, COUNT APPEARS ADEQUATE    Assessment/Plan: klebseilla pneumonia (pan sensitive) bacturia= on cx only 85,000, unclear if symptomatic since UA is clean for leuks or nitrites. Patient did have malaise on admit. Will finish up 7 day course, currently on day #6. Will change cefepime to cefazolin, to the narrowest abtx to minimize exposure to broad spectrum antibiotics.  Candidal cardiac device endocarditis = recommend to continue with IV fluconazole for 6 wks (including the time he started micafungin). Then he will be on oral suppression thereafter .would check to see if esomeprazole decreases absorption of oral fluconazole.  [name] Infectious Diseases 09/09/2011, 10:52 AM

## 2011-09-09 NOTE — Progress Notes (Signed)
Subjective:  Overall appears in good spirits.  Still has some cough.    Objective:  Vital Signs in the last 24 hours: Temp:  [98.3 F (36.8 C)-100 F (37.8 C)] 99.5 F (37.5 C) (06/10 0352) Pulse Rate:  [81-107] 106  (06/10 0700) Resp:  [24-37] 24  (06/10 0700) BP: (79-120)/(45-83) 103/60 mmHg (06/10 0700) SpO2:  [95 %-100 %] 100 % (06/10 0700)  Intake/Output from previous day: 06/09 0701 - 06/10 0700 In: 1583 [P.O.:360; I.V.:873; IV Piggyback:350] Out: 1675 [Urine:1675]   Physical Exam: General: Well developed, well nourished, in no acute distress. Head:  Normocephalic and atraumatic. Lungs: Clear to auscultation and percussion. Heart: Normal S1 and S2.  No murmur, rubs or gallops.  Pulses: Pulses normal in all 4 extremities. Extremities: No clubbing or cyanosis. No edema. Neurologic: Alert and oriented x 3.    Lab Results:  Basename 09/09/11 0615 09/08/11 1357  WBC 8.3 8.6  HGB 7.6* 7.9*  PLT 406* 461*    Basename 09/08/11 0527 09/07/11 0510  NA 131* 129*  K 4.1 4.1  CL 94* 92*  CO2 28 27  GLUCOSE 106* 126*  BUN 13 14  CREATININE 0.68 0.73   No results found for this basename: TROPONINI:2,CK,MB:2 in the last 72 hours Hepatic Function Panel No results found for this basename: PROT,ALBUMIN,AST,ALT,ALKPHOS,BILITOT,BILIDIR,IBILI in the last 72 hours No results found for this basename: CHOL in the last 72 hours No results found for this basename: PROTIME in the last 72 hours  Imaging: No results found.    Assessment/Plan:  Patient Active Hospital Problem List: Chronic systolic congestive heart failure (03/20/2010)   Assessment: I and O are negative, but   Plan: need daily weights.   Normocytic anemia (07/12/2011)   Assessment: significant anemia---Hgb 7.6   Plan: per primary team Septic embolism (08/15/2011)   Assessment: ICD out   Plan: remains on antifungal therapy Candidal endocarditis (08/20/2011)   Assessment: see above   Plan: see above  V-tach  (09/05/2011)   Assessment: Rapid VT last pm which resolved.    Plan: Dr. Johney Frame has increased beta blockers.  Will review with EP.  Would add MG to am labs to confirm situation.    Pulmonary embolism, septic (08/27/2011)   Assessment: remains on antifungal therapy.         Shawnie Pons, MD, [facility], FSCAI 09/09/2011, 8:08 AM

## 2011-09-10 ENCOUNTER — Inpatient Hospital Stay (HOSPITAL_COMMUNITY): Payer: Medicare Other

## 2011-09-10 LAB — CBC
HCT: 24.7 % — ABNORMAL LOW (ref 39.0–52.0)
Hemoglobin: 7.4 g/dL — ABNORMAL LOW (ref 13.0–17.0)
MCH: 24.1 pg — ABNORMAL LOW (ref 26.0–34.0)
MCV: 80.5 fL (ref 78.0–100.0)
RBC: 3.07 MIL/uL — ABNORMAL LOW (ref 4.22–5.81)

## 2011-09-10 LAB — HEPATIC FUNCTION PANEL
ALT: 25 U/L (ref 0–53)
Albumin: 2 g/dL — ABNORMAL LOW (ref 3.5–5.2)
Alkaline Phosphatase: 93 U/L (ref 39–117)
Total Protein: 7.6 g/dL (ref 6.0–8.3)

## 2011-09-10 LAB — BASIC METABOLIC PANEL
BUN: 13 mg/dL (ref 6–23)
CO2: 31 mEq/L (ref 19–32)
Chloride: 95 mEq/L — ABNORMAL LOW (ref 96–112)
Creatinine, Ser: 0.8 mg/dL (ref 0.50–1.35)

## 2011-09-10 MED ORDER — WARFARIN SODIUM 5 MG PO TABS
5.0000 mg | ORAL_TABLET | Freq: Once | ORAL | Status: AC
  Administered 2011-09-10: 5 mg via ORAL
  Filled 2011-09-10: qty 1

## 2011-09-10 MED ORDER — HYDROCOD POLST-CHLORPHEN POLST 10-8 MG/5ML PO LQCR
5.0000 mL | Freq: Two times a day (BID) | ORAL | Status: DC | PRN
  Administered 2011-09-10: 5 mL via ORAL
  Filled 2011-09-10: qty 5

## 2011-09-10 NOTE — Progress Notes (Addendum)
Medical Student Daily Progress Note  Subjective: Patient is a little tired and worn out this morning. He did not sleep too well last night 2/2 a terrible night sweat that he experienced. He says it was not the usual sweats he used to have when associated with a fever but simply an episode that caused him to profoundly sweat all over to the point of needing the sheets changed. He was not hot during this episode and denies any rigors, tremors, chills, or shaking. Otherwise he is in his usual state that consists of his dry, nonproductive cough and mild shortness of breath. He is sleepy and wants to try and get some rest today, however he is still looking forward to his PT session and always welcomes getting out of the bed and walking. His wife is bedside.  Objective: Vital signs in last 24 hours: Filed Vitals:   09/10/11 0700 09/10/11 0816 09/10/11 0855 09/10/11 0905  BP: 114/68   111/62  Pulse:  91    Temp:  98.1 F (36.7 C)    TempSrc:  Oral    Resp:      Height:      Weight:      SpO2:  99% 99%    Weight change:   Intake/Output Summary (Last 24 hours) at 09/10/11 0946 Last data filed at 09/10/11 0800  Gross per 24 hour  Intake   1267 ml  Output   1125 ml  Net    142 ml   Physical Exam: BP 111/62  Pulse 91  Temp(Src) 98.1 F (36.7 C) (Oral)  Resp 22  Ht 5\' 10"  (1.778 m)  Wt 82.555 kg (182 lb)  BMI 26.11 kg/m2  SpO2 99% General appearance: alert, cooperative, fatigued and no distress Head: Normocephalic, without obvious abnormality, atraumatic Throat: lips, mucosa, and tongue normal; teeth and gums normal Neck: no adenopathy, no carotid bruit, no JVD, supple, symmetrical, trachea midline and thyroid not enlarged, symmetric, no tenderness/mass/nodules Back: symmetric, no curvature. ROM normal. No CVA tenderness. Lungs: diminished breath sounds RLL and rales RML"dry"; some wheezing throughout lung fields Chest wall: no tenderness Heart: regular rate and rhythm, S1, S2 normal,  no murmur, click, rub or gallop Abdomen: soft, non-tender; bowel sounds normal; no masses,  no organomegaly Extremities: extremities normal, atraumatic, no cyanosis or edema Skin: Skin color, texture, turgor normal. No rashes or lesions. Old pacer pocket left upper chest (no rubor, dolor, calor, tumor)  Lab Results: Basic Metabolic Panel:  Lab 09/10/11 9604 09/08/11 0527 09/04/11 1800  NA 134* 131* --  K 4.6 4.1 --  CL 95* 94* --  CO2 31 28 --  GLUCOSE 113* 106* --  BUN 13 13 --  CREATININE 0.80 0.68 --  CALCIUM 9.5 8.9 --  MG 2.3 -- 2.2  PHOS -- -- --   Liver Function Tests: No results found for this basename: AST:2,ALT:2,ALKPHOS:2,BILITOT:2,PROT:2,ALBUMIN:2 in the last 168 hours No results found for this basename: LIPASE:2,AMYLASE:2 in the last 168 hours No results found for this basename: AMMONIA:2 in the last 168 hours CBC:  Lab 09/10/11 0530 09/09/11 0615  WBC 7.8 8.3  NEUTROABS -- --  HGB 7.4* 7.6*  HCT 24.7* 25.1*  MCV 80.5 79.9  PLT 414* 406*   Cardiac Enzymes:  Lab 09/06/11 0410 09/05/11 2322 09/05/11 1830  CKTOTAL 8 8 14   CKMB 1.2 1.2 1.0  CKMBINDEX -- -- --  TROPONINI <0.30 <0.30 <0.30   BNP: No results found for this basename: PROBNP:3 in the last 168 hours  D-Dimer: No results found for this basename: DDIMER:2 in the last 168 hours CBG: No results found for this basename: GLUCAP:6 in the last 168 hours Hemoglobin A1C: No results found for this basename: HGBA1C in the last 168 hours Fasting Lipid Panel: No results found for this basename: CHOL,HDL,LDLCALC,TRIG,CHOLHDL,LDLDIRECT in the last 914 hours Thyroid Function Tests: No results found for this basename: TSH,T4TOTAL,FREET4,T3FREE,THYROIDAB in the last 168 hours Coagulation:  Lab 09/10/11 0530 09/04/11 0500  LABPROT 15.5* 15.3*  INR 1.20 1.18   Anemia Panel: No results found for this basename: VITAMINB12,FOLATE,FERRITIN,TIBC,IRON,RETICCTPCT in the last 168 hours Urine Drug Screen: Drugs of  Abuse  No results found for this basename: labopia, cocainscrnur, labbenz, amphetmu, thcu, labbarb    Alcohol Level: No results found for this basename: ETH:2 in the last 168 hours Urinalysis: No results found for this basename: COLORURINE:2,APPERANCEUR:2,LABSPEC:2,PHURINE:2,GLUCOSEU:2,HGBUR:2,BILIRUBINUR:2,KETONESUR:2,PROTEINUR:2,UROBILINOGEN:2,NITRITE:2,LEUKOCYTESUR:2 in the last 168 hours  Micro Results: Recent Results (from the past 240 hour(s))  URINE CULTURE     Status: Normal   Collection Time   08/31/11  3:57 PM      Component Value Range Status Comment   Specimen Description URINE, RANDOM   Final    Special Requests NONE   Final    Culture  Setup Time [id number]   Final    Colony Count 85,000 COLONIES/ML   Final    Culture KLEBSIELLA PNEUMONIAE   Final    Report Status 09/03/2011 FINAL   Final    Organism ID, Bacteria KLEBSIELLA PNEUMONIAE   Final   CULTURE, BLOOD (ROUTINE X 2)     Status: Normal   Collection Time   08/31/11  4:07 PM      Component Value Range Status Comment   Specimen Description BLOOD RIGHT HAND   Final    Special Requests BOTTLES DRAWN AEROBIC AND ANAEROBIC 8CC   Final    Culture  Setup Time [id number]   Final    Culture NO GROWTH 5 DAYS   Final    Report Status 09/06/2011 FINAL   Final   CULTURE, BLOOD (ROUTINE X 2)     Status: Normal   Collection Time   08/31/11  4:25 PM      Component Value Range Status Comment   Specimen Description BLOOD RIGHT FOOT   Final    Special Requests BOTTLES DRAWN AEROBIC AND ANAEROBIC 10CC   Final    Culture  Setup Time [id number]   Final    Culture NO GROWTH 5 DAYS   Final    Report Status 09/06/2011 FINAL   Final   MRSA PCR SCREENING     Status: Normal   Collection Time   09/04/11  8:30 PM      Component Value Range Status Comment   MRSA by PCR NEGATIVE  NEGATIVE  Final   TECHNOLOGIST SMEAR REVIEW     Status: Normal   Collection Time   09/07/11  5:10 AM      Component Value Range Status Comment   Path Review      Final    Value: PLATELET CLUMPS NOTED ON SMEAR, COUNT APPEARS ADEQUATE   Studies/Results: No results found. Medications:  I have reviewed the patient's current medications. Anti-infectives     Start     Dose/Rate Route Frequency Ordered Stop   09/09/11 1400   ceFAZolin (ANCEF) IVPB 2 g/50 mL premix  Status:  Discontinued        2 g 100 mL/hr over 30 Minutes Intravenous 3 times per day 09/09/11 1059 09/09/11  1335   09/07/11 1400   ceFEPIme (MAXIPIME) 1 g in dextrose 5 % 50 mL IVPB  Status:  Discontinued        1 g 100 mL/hr over 30 Minutes Intravenous 3 times per day 09/07/11 1327 09/09/11 1058   09/04/11 2000   ciprofloxacin (CIPRO) tablet 500 mg  Status:  Discontinued        500 mg Oral 2 times daily 09/04/11 1410 09/07/11 1306   09/02/11 1400   fluconazole (DIFLUCAN) IVPB 400 mg        400 mg 200 mL/hr over 60 Minutes Intravenous Every 24 hours 09/02/11 1300     09/02/11 1200   vancomycin (VANCOCIN) 1,250 mg in sodium chloride 0.9 % 250 mL IVPB  Status:  Discontinued        1,250 mg 166.7 mL/hr over 90 Minutes Intravenous Every 12 hours 09/02/11 1001 09/03/11 0947   08/31/11 1600   vancomycin (VANCOCIN) IVPB 1000 mg/200 mL premix  Status:  Discontinued        1,000 mg 200 mL/hr over 60 Minutes Intravenous Every 8 hours 08/31/11 1527 09/02/11 1001   08/31/11 1600   piperacillin-tazobactam (ZOSYN) IVPB 3.375 g  Status:  Discontinued        3.375 g 12.5 mL/hr over 240 Minutes Intravenous 3 times per day 08/31/11 1527 09/04/11 1410   08/27/11 2200   micafungin (MYCAMINE) 150 mg in sodium chloride 0.9 % 100 mL IVPB  Status:  Discontinued        150 mg 100 mL/hr over 1 Hours Intravenous Every 24 hours 08/27/11 2031 09/02/11 1300         Scheduled Meds:   . aspirin EC  81 mg Oral Daily  . clonazePAM  0.5 mg Oral Daily  . clonazePAM  1 mg Oral QHS  . coumadin book   Does not apply Once  . docusate sodium  100 mg Oral BID  . feeding supplement  237 mL Oral TID BM  .  fluconazole (DIFLUCAN) IV  400 mg Intravenous Q24H  . furosemide  60 mg Oral Daily  . levalbuterol  0.63 mg Nebulization Q6H  . losartan  25 mg Oral Daily  . metoprolol  100 mg Oral BID  . pantoprazole (PROTONIX) IV  40 mg Intravenous Q12H  . patient's guide to using coumadin book   Does not apply Once  . sodium chloride  3 mL Intravenous Q12H  . traZODone  50 mg Oral QHS  . warfarin  5 mg Oral ONCE-1800  . warfarin   Does not apply Once  . Warfarin - Pharmacist Dosing Inpatient   Does not apply q1800  . DISCONTD:  ceFAZolin (ANCEF) IV  2 g Intravenous Q8H  . DISCONTD: ceFEPime (MAXIPIME) IV  1 g Intravenous Q8H  . DISCONTD: feeding supplement  237 mL Oral BID BM  . DISCONTD: warfarin  7.5 mg Oral ONCE-1800   Continuous Infusions:   . sodium chloride 10 mL/hr at 09/06/11 1900  . heparin 2,700 Units/hr (09/10/11 0318)   PRN Meds:.acetaminophen, ipratropium, levalbuterol, oxyCODONE-acetaminophen, polyethylene glycol, sodium chloride Assessment/Plan:  Normocytic anemia - Hgb 7.4 today. MCV 80.5. Hgb has been trending down since admission. FOBT test positive x1 after BM (patient was constipated for 4 days). 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 Iron = 21, Sat ratio = 9, TIBC =243, Ferritin = 597, Retic count =1.9, FOBT negative. Smear revealed mild anemia with normal morphology. Side effect of micafungin can be suppression of RBC  production (3-10%).  - Appreciate GI consult in the management of this patient  - Per GI: conservative management. Monitor Hgb then endoscopy  - ferritin in process - Will monitor CBC. Transfuse if Hgb <7 and possible Endoscopy   Distal Femoral Vein thrombosis - Patient had LE doppler on 09/03/11 that showed DVT in right femoral vein distally. MRI/MRA head on 09/04/11: see read. Notable small bulge proximal A1 segment of left ACA (not a typical place for mycotic aneurysm however)  - heparin per pharmacy, with target lower end of therapeutic range (he is  therapeutic as of 09/08/11)  - coumadin per pharmacy (started 09/09/11)  - Will monitor closely for any signs of bleeding. If stable no IVC filer will be placed.   Pleural effusion / pulmonary embolism / pulmonary infarction - Patient is s/p thoracentesis (08/28/11) for R effusion. Most recent CT chest (09/01/11) shows unchanged central right PA Candidal embolus (extension to RML, RLL), and embolus/occlusion of LUL pulmonary artery branches. Moderate right pleural effusion present (possibly loculated). Wedge shaped infarcts noted in RML and LUL.  Pleural fluid culture: Negative, final / Pleural fluid Fungus culture: in progress x 4 weeks (collected 08/28/11): NTD / Pleural fluid AFB culture w/ smear: in progress x 6 weeks (collected 08/28/11): NTD  - given increased cough overnight and wheezing on exam, will obtain CXR today - Continue conservative management with pain medication and inhalers  - Continue incentive spirometer to help with expulsion / cough   Bacteriuria - Patient's urine culture revealed Klebsiella pneumoniae. 85,000 col/mL. Urine culture pansensitive except for Amoxicillin. D/c zosyn (08/31/11>>09/04/11). Started Cipro on 6/5>>09/07/11. Then changed to cefepime 09/07/11>>09/09/11 for lower risk of C. Diff for a total of 10 day course. - abx course completed  Acute on chronic systolic CHF 2/2 CAD and ischemic cardiomyopathy - Improved. 2D echo (08/23/11) shows EF = 30% with diffuse hypokinesis, mild LVH, and moderately dilated LA. 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  - Appreciate cardiology consult in the management of this patient  - continue ASA 81 mg daily  - continue metoprolol 100 mg BID  - continue losartan 25 mg daily  - Continue lasix 60 daily   NSVT - Patient has had recent episodes of NSVT. Most recent significant episode of 47 beat run over 10 seconds 09/04/11 (none since). Currently on Metoprolol tartrate 100 mg BID  - Continue current regimen  -  Goal of K>3.9 and Mg>1.9   Tachycardia - Improving slowly. Patient continues to have episodes of tachycardia (90's -110's). Multifactorial . Currently on metoprolol 100 BID.  - Continue current regimen  - Will continue to monitor   Fever - resolved; Patient had Candidal embolus (from atrial lead on former ICD) to R pulmonary artery on 08/15/11.  blood cultures x 2 : Negative, final. Change micafungin (08/15/11>>09/01/11) to IV fluconazole 400 mg daily to finish 6 week course (08/15/11>>) since cultures sensitive to micafungin and fluconazole  - Will continue to monitor  - Treatment completed 09/09/11 for bacteriuria - Old pacer pocket (no warmth, erythema, tenderness, disproportionate swelling). Will continue to monitor  - Appreciate ID recommendations and consult in the management of this patient   Hyponatremia - Resolved. Na =134 today . Likely 2/2 his CHF and removal of his biV ICD.  - will continue to monitor   GERD / Esophageal Candidiasis - Stable. He does note some minor discomfort with swallowing, however he also states that the dilation on 08/13/11 was  not a complete dilation that he is used to.  - Appreciate Gastroenterology consult in the management of this patient  - continue fluconazole  - continue Protonix 40 mg IV BID, per GI (2/2 potential of esophageal oozing and pill malabsorption)  - will check on the effect of PPI's and H2 blockers in relation to the absorption and thus efficacy of oral fluconazole   Anxiety / Insomnia - He has had some anxiety at night when trying to sleep, especially with his cough.  - continue clonazepam 1 mg QHS and 0.5 mg daily  - continue trazodone 50 mg QHS   Insomnia - Stable as long as anxiety is controlled.  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 On SPEP his IgM and IgA were increased during his last admission, which could likely be due to his acute  illnesses. Hematology consult during previous admission reccommended follow-up after his current infection is completely resolved.  - repeat SPEP/UPEP after full resolution of Candidal infection   Disposition - CXR today. Patient was transferred to SDU yesterday. Pending clinical improvement may transfer to telemetry soon. Continue close monitoring.   LOS: 14 days   This is a Psychologist, occupational Note.  The care of the patient was discussed with Dr. Margorie John and the assessment and plan formulated with their assistance.  Please see their attached note for official documentation of the daily encounter.  [name] 09/10/2011, 9:46 AM   Resident Addendum to Medical Student Note   I have seen and examined the patient, and agree with the the medical student assessment and plan outlined above. Please see my brief note below for additional details.  HPI: Patient is felling very tired after not sleeping well due to coughing which kept him up. He is not coughing up anything. Patient had BM this AM.  OBJECTIVE: VS: Reviewed  Meds: Reviewed  Labs: Reviewed  Imaging: Reviewed   Physical Exam: General: tired appearing elderly male resting in bed HEENT: PERRL, EOMI, no scleral icterus Cardiac: RRR, no rubs, murmurs or gallops Pulm: diffuse wheeze present, moving normal volumes of air Abd: soft, nontender, nondistended, BS present Ext: warm and well perfused, no pedal edema Neuro: alert and oriented X3, cranial nerves II-XII grossly intact   ASSESSMENT/ PLAN:  Patient's hemoglobin is essentially stable at 7.4  Will get 2-view chest X-ray to evaluate increased dyspnea and cough. -He walked yesterday without oxygen and oxygen saturation stayed above 91%.  INR is 1.2, not yet therapeutic  Length of Stay: 14   Margorie John, M.D. [facility], Internal Medicine Resident 09/10/2011, 1:48 PM

## 2011-09-10 NOTE — Progress Notes (Signed)
Internal Medicine Attending  Date: 09/10/2011  Patient name: [name] Medical record number: [id number] Date of birth: 01-09-1946 Age: 66 y.o. Gender: male  I saw and evaluated the patient. I reviewed the resident's note by Dr. Maisie Fus and I agree with the resident's findings and plans as documented in her note.

## 2011-09-10 NOTE — Progress Notes (Signed)
Physical Therapy Treatment Patient Details Name: [name] MRN: [id number] DOB: 1946/01/06 Today's Date: 09/10/2011 Time: 4098-1191 PT Time Calculation (min): 30 min  PT Assessment / Plan / Recommendation Comments on Treatment Session  Pt doing much better, however continues with weakness. Will continue to assess balance and need for an assistive device.    Follow Up Recommendations  Home health PT;Supervision for mobility/OOB    Barriers to Discharge        Equipment Recommendations  None recommended by PT    Recommendations for Other Services    Frequency Min 3X/week   Plan Discharge plan remains appropriate;Frequency remains appropriate    Precautions / Restrictions Precautions Precautions: Fall   Pertinent Vitals/Pain SaO2 97% on 2L and 97% on RA after 4 minutes of rest (pror to activity); during ambulation could not get monitor to register; immediately on return to room, SaO2 96% on RA. As pt returned to supine and relaxed, SaO2 steadily decr to 91% over ~5 minutes and 2L O2 resumed per RN request. 98% on 2L.    Mobility  Bed Mobility Bed Mobility: Rolling Right;Right Sidelying to Sit;Sitting - Scoot to [place] of Bed;Sit to Supine Rolling Right: 6: Modified independent (Device/Increase time) Right Sidelying to Sit: 5: Supervision;HOB elevated (HOB 20) Sitting - Scoot to Edge of Bed: 6: Modified independent (Device/Increase time) Sit to Supine: 6: Modified independent (Device/Increase time) Details for Bed Mobility Assistance: Continues to require incr time/effort; unable to lie flat due to coughing Transfers Transfers: Sit to Stand;Stand to Sit Sit to Stand: 5: Supervision;With upper extremity assist;Without upper extremity assist;With armrests;From bed;From toilet Stand to Sit: 5: Supervision;Without upper extremity assist;To bed;To toilet Details for Transfer Assistance: Steady with no cues needed; transfer x 2 (with and without UE use); no knee buckling/jerking  this date Ambulation/Gait Ambulation/Gait Assistance: 4: Min guard Ambulation Distance (Feet): 300 Feet Assistive device: Rolling walker;None Ambulation/Gait Assistance Details: One standing rest break (unable to get SaO2 monitor to register); able to talk to his wife while walking with minimal dyspnea and coughing. Ambulated with RW and without (no significant unsteadiness--just general weakness). Gait Pattern: Within Functional Limits Gait velocity: decreased    Exercises Other Exercises Other Exercises: Pt reports he has been doing his exercises, including bridging. Demonstrated bridging x 1 with correct technique.   PT Diagnosis:    PT Problem List:   PT Treatment Interventions:     PT Goals Acute Rehab PT Goals PT Goal Formulation: With patient Time For Goal Achievement: 09/20/11 Potential to Achieve Goals: Good Pt will go Supine/Side to Sit: with modified independence;with HOB 0 degrees PT Goal: Supine/Side to Sit - Progress: Goal set today Pt will go Sit to Stand: with modified independence;without upper extremity assist PT Goal: Sit to Stand - Progress: Goal set today Pt will go Stand to Sit: with modified independence;without upper extremity assist PT Goal: Stand to Sit - Progress: Goal set today PT Transfer Goal: Bed to Chair/Chair to Bed - Progress: Discontinued (comment) PT Goal: Stand - Progress: Updated due to goal met Pt will Ambulate: >150 feet;with modified independence;with least restrictive assistive device;with gait velocity >(comment) ft/second (velocity > 1.8 ft/sec to indicate decr fall risk) PT Goal: Ambulate - Progress: Goal set today Pt will Perform Home Exercise Program: Independently PT Goal: Perform Home Exercise Program - Progress: Goal set today  Visit Information  Last PT Received On: 09/10/11 Assistance Needed: +1    Subjective Data  Subjective: I wish I could walk more   Cognition  Overall Cognitive Status: Appears within functional limits  for tasks assessed/performed Arousal/Alertness: Awake/alert Orientation Level: Appears intact for tasks assessed Behavior During Session: [facility] for tasks performed    Balance     End of Session PT - End of Session Equipment Utilized During Treatment: Gait belt Activity Tolerance: Patient tolerated treatment well Patient left: in bed;with call bell/phone within reach;with family/visitor present;Other (comment) (did not sleep well due to coughing & requested back to bed) Nurse Communication: Mobility status    [name] 09/10/2011, 11:33 AM Pager [phone]

## 2011-09-10 NOTE — Progress Notes (Signed)
ANTICOAGULATION CONSULT NOTE - Follow Up Consult  Pharmacy Consult for heparin Indication: DVT and septic lung emboli  Labs:  Basename 09/10/11 0530 09/09/11 0615 09/08/11 1357 09/08/11 0527  HGB 7.4* 7.6* -- --  HCT 24.7* 25.1* 25.8* --  PLT 414* 406* 461* --  APTT -- -- -- --  LABPROT 15.5* -- -- --  INR 1.20 -- -- --  HEPARINUNFRC 0.42 0.38 0.38 --  CREATININE 0.80 -- -- 0.68  CKTOTAL -- -- -- --  CKMB -- -- -- --  TROPONINI -- -- -- --    Assessment: 66yo male now therapeutic (HL= 0.42) on heparin at 2700 units/hour) LE doppler on 09/03/11 that showed DVT in right femoral vein distally). Hg=7.4 and low but stable. Patient also on coumadin day 2  INR=1.2 and noted on fluconazole (significant interaction with coumadin)  Goal of Therapy:  Heparin level 0.3-0.5 units/ml Platlet monitoring per protocol: yes INR=2-3   Plan:  1. Continue heparin at 2700 units/hour 2. Coumadin 5mg  po x1 today 3. PT/INR, CBC and heparin levels daily 4. Consider changing protonix to po?  Benny Lennert PharmD 09/10/2011,11:41 AM

## 2011-09-10 NOTE — Progress Notes (Signed)
Subjective:  Fair amount of cough last pm.  Seems better now.  CXR results reviewed with his wife.    Objective:  Vital Signs in the last 24 hours: Temp:  [98 F (36.7 C)-98.8 F (37.1 C)] 98 F (36.7 C) (06/11 1930) Pulse Rate:  [79-110] 94  (06/11 2000) Resp:  [22-24] 24  (06/11 1930) BP: (94-126)/(40-79) 115/50 mmHg (06/11 1930) SpO2:  [94 %-100 %] 97 % (06/11 2030)  Intake/Output from previous day: 06/10 0701 - 06/11 0700 In: 1331 [P.O.:480; I.V.:841; IV Piggyback:10] Out: 1125 [Urine:1125]   Physical Exam: General: Well developed, well nourished, in no acute distress. Head:  Normocephalic and atraumatic. Lungs:Ronchii and decrease BS R base.   Heart: Normal S1 and S2.  S4 Pulses: Pulses normal in all 4 extremities. Extremities: No clubbing or cyanosis. No edema. Neurologic: Alert and oriented x 3.    Lab Results:  Basename 09/10/11 0530 09/09/11 0615  WBC 7.8 8.3  HGB 7.4* 7.6*  PLT 414* 406*    Basename 09/10/11 0530 09/08/11 0527  NA 134* 131*  K 4.6 4.1  CL 95* 94*  CO2 31 28  GLUCOSE 113* 106*  BUN 13 13  CREATININE 0.80 0.68   No results found for this basename: TROPONINI:2,CK,MB:2 in the last 72 hours Hepatic Function Panel  Basename 09/10/11 0530  PROT 7.6  ALBUMIN 2.0*  AST 24  ALT 25  ALKPHOS 93  BILITOT 0.1*  BILIDIR <0.1  IBILI NOT CALCULATED   No results found for this basename: CHOL in the last 72 hours No results found for this basename: PROTIME in the last 72 hours  Imaging: Dg Chest 2 View  09/10/2011  *RADIOLOGY REPORT*  Clinical Data: Shortness of breath.  Cough and congestion.  History of pulmonary embolism.  CHEST - 2 VIEW  Comparison: Chest x-ray 09/06/2011.  Chest CT 09/01/2011.  Findings: There is a right upper extremity PICC with tip terminating in the mid superior vena cava. The patient is status post median sternotomyfor CABG with a LIMA. Lung volumes are normal.  Mild elevation of the right hemidiaphragm has increased  compared to the prior study.  There continues to be patchy areas of interstitial prominence and areas of airspace consolidation throughout the lungs bilaterally, most pronounced in the right middle and lower lobes.  Mild thickening of the horizontal fissure is again noted.  Small right-sided pleural effusion.  No definite left pleural effusion.  No evidence of pulmonary edema.  Heart size is borderline enlarged (unchanged). The patient is rotated to the right on today's exam, resulting in distortion of the mediastinal contours and reduced diagnostic sensitivity and specificity for mediastinal pathology.  Atherosclerosis in the thoracic aorta.  IMPRESSION: 1.  The appearance of chest is very similar to the recent prior study from 09/06/2011, but overall demonstrates slightly improved aeration.  Findings are suggestive of resolving areas of pulmonary hemorrhage related to resolving areas of pulmonary infarction. 2.  Small right-sided pleural effusion has also slightly decreased in size. 3.  Increased elevation of the right hemidiaphragm. 4.  Postoperative changes and support apparatus, as above. 5.  Atherosclerosis.  Original Report Authenticated By: Florencia Reasons, M.D.    Telemetry reviewed.    Assessment/Plan:  Patient Active Hospital Problem List: Chronic systolic congestive heart failure (03/20/2010)   Assessment: appears stable   Plan: need to monitor daily weights.  Normocytic anemia (07/12/2011)   Assessment: continuing to watch   Plan: Hgb stable but marginal Ventricular tachycardia   Discussed with  Dr. Johney Frame.  He would rec continued metoprolol for now;  Will need to be readressed before home.       Shawnie Pons, MD, [facility], FSCAI 09/10/2011, 9:03 PM

## 2011-09-10 NOTE — Progress Notes (Addendum)
INFECTIOUS DISEASE PROGRESS NOTE  [name] is a 66 y.o. 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 Also has concurrent bacturia with pan sensitive kleb pneumonia.  Subjective: Afebrile, reports poor sleep due to cough keeping him up at night  Abtx:  Cefepime  6/8-6/10 cipro 6/05-6/08  Day #8 fluconazole Previously on micafungin  Medications:     . aspirin EC  81 mg Oral Daily  . clonazePAM  0.5 mg Oral Daily  . clonazePAM  1 mg Oral QHS  . coumadin book   Does not apply Once  . docusate sodium  100 mg Oral BID  . feeding supplement  237 mL Oral TID BM  . fluconazole (DIFLUCAN) IV  400 mg Intravenous Q24H  . furosemide  60 mg Oral Daily  . levalbuterol  0.63 mg Nebulization Q6H  . losartan  25 mg Oral Daily  . metoprolol  100 mg Oral BID  . pantoprazole (PROTONIX) IV  40 mg Intravenous Q12H  . patient's guide to using coumadin book   Does not apply Once  . sodium chloride  3 mL Intravenous Q12H  . traZODone  50 mg Oral QHS  . warfarin  5 mg Oral ONCE-1800  . warfarin   Does not apply Once  . Warfarin - Pharmacist Dosing Inpatient   Does not apply q1800  . DISCONTD:  ceFAZolin (ANCEF) IV  2 g Intravenous Q8H  . DISCONTD: ceFEPime (MAXIPIME) IV  1 g Intravenous Q8H  . DISCONTD: feeding supplement  237 mL Oral BID BM  . DISCONTD: warfarin  7.5 mg Oral ONCE-1800     Objective: Vital signs in last 24 hours: Temp:  [98.1 F (36.7 C)-98.8 F (37.1 C)] 98.1 F (36.7 C) (06/11 0816) Pulse Rate:  [82-110] 91  (06/11 0816) Resp:  [22-25] 22  (06/11 0347) BP: (96-126)/(40-79) 111/62 mmHg (06/11 0905) SpO2:  [89 %-100 %] 99 % (06/11 0855) Gen: Fatigued, pale appearing man in no acute distress; alert, appropriate and cooperative throughout examination. Head: Normocephalic, atraumatic.well healing surgical  incision in the right temple.  Eyes: PERRL, EOMI, No signs of anemia or jaundince.  Throat: Oropharynx nonerythematous, no exudate appreciated.  Neck: Supple with no deformities, masses, or tenderness noted. No carotid Bruits, no JVD. Lungs: Normal respiratory effort. Clear to auscultation BL, without wheezes. Bibasilar inspiratory crackles.  Heart: Tachycardic with occasional extra beats. S1 and S2 normal without murmur, gallop,or rubs. Abdomen: BS normoactive. Soft, nondistended, non-tender. No masses or organomegaly. MSK/Extremities: No pretibial edema.the patient does have deformity and wasting in the left hand status post crush injury many years ago  lymphaticno anterior/posterior cervical, axillary, or inguinal lymphadenopathy  Skin: No visible rashes. Vitilligo present on both hands. Angular chelitis present in the bilateral corners of the mouth. Right arm PICC c/d/i Psych: mood and affect are normal.    Lab Results  Basename 09/10/11 0530 09/09/11 0615 09/08/11 0527  WBC 7.8 8.3 --  HGB 7.4* 7.6* --  HCT 24.7* 25.1* --  NA 134* -- 131*  K 4.6 -- 4.1  CL 95* -- 94*  CO2 31 -- 28  BUN 13 -- 13  CREATININE 0.80 -- 0.68  GLU -- -- --    Microbiology: Recent Results (from the past 240 hour(s))  URINE CULTURE     Status: Normal   Collection Time   08/31/11  3:57 PM  Component Value Range Status Comment   Specimen Description URINE, RANDOM   Final    Special Requests NONE   Final    Culture  Setup Time [id number]   Final    Colony Count 85,000 COLONIES/ML   Final    Culture KLEBSIELLA PNEUMONIAE   Final    Report Status 09/03/2011 FINAL   Final    Organism ID, Bacteria KLEBSIELLA PNEUMONIAE   Final   CULTURE, BLOOD (ROUTINE X 2)     Status: Normal   Collection Time   08/31/11  4:07 PM      Component Value Range Status Comment   Specimen Description BLOOD RIGHT HAND   Final    Special Requests BOTTLES DRAWN AEROBIC AND ANAEROBIC 8CC   Final    Culture  Setup Time  [id number]   Final    Culture NO GROWTH 5 DAYS   Final    Report Status 09/06/2011 FINAL   Final   CULTURE, BLOOD (ROUTINE X 2)     Status: Normal   Collection Time   08/31/11  4:25 PM      Component Value Range Status Comment   Specimen Description BLOOD RIGHT FOOT   Final    Special Requests BOTTLES DRAWN AEROBIC AND ANAEROBIC 10CC   Final    Culture  Setup Time [id number]   Final    Culture NO GROWTH 5 DAYS   Final    Report Status 09/06/2011 FINAL   Final   MRSA PCR SCREENING     Status: Normal   Collection Time   09/04/11  8:30 PM      Component Value Range Status Comment   MRSA by PCR NEGATIVE  NEGATIVE  Final   TECHNOLOGIST SMEAR REVIEW     Status: Normal   Collection Time   09/07/11  5:10 AM      Component Value Range Status Comment   Path Review     Final    Value: PLATELET CLUMPS NOTED ON SMEAR, COUNT APPEARS ADEQUATE    Assessment/Plan:   Candidal cardiac device endocarditis = recommend to continue with IV fluconazole for 6 wks (including the time he started micafungin). Then he will be on oral suppression thereafter .would check to see if esomeprazole decreases absorption of oral fluconazole.  klebseilla pneumonia (pan sensitive) bacturia= possibly symptomatic on admit. He finished treatment course yesterday.  Cough= patient just finished course of antibiotics which would treat pathogens c/w hcap. Would try a course of robitussin to see if it helps him. Check cxr.   Dispo= mrs. Alwin is wondering how much the cost of IV fluconazole will be seen since her outpocket expense was quite high with micafungin. Patient may need snf if too costly to provide meds as outpatient.  Will arrange for follow up in ID clinic.  Will sign off, call if questions.  Mikle Sternberg Infectious Diseases 09/10/2011, 10:00 AM

## 2011-09-11 LAB — CBC
HCT: 25.4 % — ABNORMAL LOW (ref 39.0–52.0)
Hemoglobin: 7.6 g/dL — ABNORMAL LOW (ref 13.0–17.0)
MCH: 24.3 pg — ABNORMAL LOW (ref 26.0–34.0)
MCHC: 29.9 g/dL — ABNORMAL LOW (ref 30.0–36.0)
RBC: 3.13 MIL/uL — ABNORMAL LOW (ref 4.22–5.81)

## 2011-09-11 LAB — HEPARIN LEVEL (UNFRACTIONATED): Heparin Unfractionated: 0.39 IU/mL (ref 0.30–0.70)

## 2011-09-11 LAB — PROTIME-INR: INR: 1.23 (ref 0.00–1.49)

## 2011-09-11 MED ORDER — LEVALBUTEROL HCL 0.63 MG/3ML IN NEBU
0.6300 mg | INHALATION_SOLUTION | Freq: Two times a day (BID) | RESPIRATORY_TRACT | Status: DC
  Administered 2011-09-11 – 2011-09-14 (×5): 0.63 mg via RESPIRATORY_TRACT
  Filled 2011-09-11 (×8): qty 3

## 2011-09-11 MED ORDER — WARFARIN SODIUM 7.5 MG PO TABS
7.5000 mg | ORAL_TABLET | Freq: Once | ORAL | Status: AC
  Administered 2011-09-11: 7.5 mg via ORAL
  Filled 2011-09-11: qty 1

## 2011-09-11 NOTE — Progress Notes (Signed)
Physical Therapy Treatment Patient Details Name: [name] MRN: [id number] DOB: [birth year] Today's Date: 09/11/2011 Time: 7829-5621 PT Time Calculation (min): 28 min  PT Assessment / Plan / Recommendation Comments on Treatment Session  Pt sleepy (reports due to cough medicine) and HR increased to 128 with walking and 120 with standing exercises (104 at rest). Pt usually eager to incr activity, however today requesting to defer further exercise due to fatigue.    Follow Up Recommendations  Home health PT;Supervision for mobility/OOB    Barriers to Discharge        Equipment Recommendations  None recommended by PT    Recommendations for Other Services    Frequency Min 3X/week   Plan Discharge plan remains appropriate;Frequency remains appropriate    Precautions / Restrictions Precautions Precautions: Fall   Pertinent Vitals/Pain HR 104-128 with ambulation; SaO2 95% on RA during ambulation    Mobility  Bed Mobility Bed Mobility: Rolling Right;Right Sidelying to Sit;Sitting - Scoot to [place] of Bed;Sit to Supine Rolling Right: 6: Modified independent (Device/Increase time) Right Sidelying to Sit: 5: Supervision;HOB flat Sitting - Scoot to Edge of Bed: 6: Modified independent (Device/Increase time) Details for Bed Mobility Assistance: Pt pulls on side of mattress to assist himself; nearly needing assist to come to sitting EOB with incr effort (supervision) Transfers Transfers: Sit to Stand;Stand to Sit Sit to Stand: 5: Supervision;With upper extremity assist;From bed;From chair/3-in-1 Stand to Sit: 5: Supervision;With upper extremity assist;With armrests;To chair/3-in-1 Details for Transfer Assistance: Requires instructional cues for safe use of DME/safe hand placement (could not recall correct technique with questioning cue--slight lethargy, ? from codiene). Practiced a second stand and then pt too fatiqued with HR elevated with minimal exertion and further activities  deferred). Ambulation/Gait Ambulation/Gait Assistance: 4: Min guard Ambulation Distance (Feet): 400 Feet Assistive device: Rolling walker Ambulation/Gait Assistance Details: Pt wanted to use RW entire time today due to decr alertness and fall last p.m. (got up alone to use urinal). SaO2 95% on RA during ambulation. Gait Pattern: Within Functional Limits Gait velocity: decreased    Exercises Other Exercises Other Exercises: Wife reported had lost pt's handout and provided a new one.   PT Diagnosis:    PT Problem List:   PT Treatment Interventions:     PT Goals Acute Rehab PT Goals PT Goal: Supine/Side to Sit - Progress: Progressing toward goal PT Goal: Sit to Stand - Progress: Progressing toward goal PT Goal: Stand to Sit - Progress: Progressing toward goal PT Goal: Ambulate - Progress: Progressing toward goal PT Goal: Perform Home Exercise Program - Progress: Progressing toward goal  Visit Information  Last PT Received On: 09/11/11 Assistance Needed: +1    Subjective Data  Subjective: Reports he took cough medicine with codeine last night. Not sure what happened when he fell. "I just went down."   Cognition  Overall Cognitive Status: Appears within functional limits for tasks assessed/performed Arousal/Alertness: Lethargic (slightly) Orientation Level: Appears intact for tasks assessed Behavior During Session: [facility] for tasks performed    Balance     End of Session PT - End of Session Equipment Utilized During Treatment: Gait belt Activity Tolerance: Patient limited by fatigue (and incr HR) Patient left: in chair;with call bell/phone within reach;with family/visitor present Nurse Communication: Mobility status    Sebastian Lurz 09/11/2011, 12:00 PM Pager [phone]

## 2011-09-11 NOTE — Progress Notes (Signed)
09/11/2011 0305 Called to Patient room. Pt sitting in floor beside of bed. Stated he thought he would be all right to stand to void. Did not call for assistance from nursing staff nor wife who was at bedside. Pt has not been standing to void this shift until now. No injury noted. Lines intact. No skin issues. Vital signs. BP 122/56  HR 94 sinus rhythm. O2 sat 98% on O2.  Assisted back to bed. Able to ambulate without pain. Wife consoled. MD notified. Yolander Goodie, Linnell Fulling

## 2011-09-11 NOTE — Progress Notes (Signed)
Medical Student Daily Progress Note  Subjective: Patient slept very well last night and his cough was very well controlled with his dose of Tussionex. He did however attempt to ambulate to retrieve his urinal container in the middle of the night and suffered a fall. He did not sustain any injuries, he has no pain 2/2 the fall, and nursing staff was immediately bedside upon incident. He attributes the fall to the fact that he was finally able to sleep well and attempted to get out of bed before fully waking up. He otherwise has no new complaints, is breathing better and his cough is better. He is alert and resting in bed comfortably on exam.  Objective: Vital signs in last 24 hours: Filed Vitals:   09/11/11 0305 09/11/11 0347 09/11/11 0350 09/11/11 0458  BP: 122/56 101/52 101/52   Pulse: 108  96   Temp:  99.5 F (37.5 C)    TempSrc:  Oral    Resp:  20    Height:      Weight:    80 kg (176 lb 5.9 oz)  SpO2: 100% 95% 99%    Weight change:   Intake/Output Summary (Last 24 hours) at 09/11/11 0816 Last data filed at 09/11/11 0700  Gross per 24 hour  Intake   1817 ml  Output   1850 ml  Net    -33 ml   Physical Exam: BP 101/52  Pulse 96  Temp 99.5 F (37.5 C) (Oral)  Resp 20  Ht 5\' 10"  (1.778 m)  Wt 80 kg (176 lb 5.9 oz)  BMI 25.31 kg/m2  SpO2 98% General appearance: alert, cooperative and no distress Head: Normocephalic, without obvious abnormality, atraumatic Throat: lips, mucosa, and tongue normal; teeth and gums normal Neck: no adenopathy, no carotid bruit, no JVD, supple, symmetrical, trachea midline and thyroid not enlarged, symmetric, no tenderness/mass/nodules Back: symmetric, no curvature. ROM normal. No CVA tenderness. Lungs: diminished breath sounds RLL and rales RML and dry Chest wall: no tenderness Heart: regular rate and rhythm, S1, S2 normal, no murmur, click, rub or gallop Abdomen: soft, non-tender; bowel sounds normal; no masses,  no organomegaly Extremities:  extremities normal, atraumatic, no cyanosis or edema Skin: Skin color, texture, turgor normal. No rashes or lesions. Old pacer pocket left upper chest (no rubor, dolor, calor, tumor)  Lab Results: Basic Metabolic Panel:  Lab 09/10/11 2130 09/08/11 0527 09/04/11 1800  NA 134* 131* --  K 4.6 4.1 --  CL 95* 94* --  CO2 31 28 --  GLUCOSE 113* 106* --  BUN 13 13 --  CREATININE 0.80 0.68 --  CALCIUM 9.5 8.9 --  MG 2.3 -- 2.2  PHOS -- -- --   Liver Function Tests:  Lab 09/10/11 0530  AST 24  ALT 25  ALKPHOS 93  BILITOT 0.1*  PROT 7.6  ALBUMIN 2.0*   CBC:  Lab 09/11/11 0500 09/10/11 0530  WBC 7.8 7.8  NEUTROABS -- --  HGB 7.6* 7.4*  HCT 25.4* 24.7*  MCV 81.2 80.5  PLT 432* 414*   Cardiac Enzymes:  Lab 09/06/11 0410 09/05/11 2322 09/05/11 1830  CKTOTAL 8 8 14   CKMB 1.2 1.2 1.0  CKMBINDEX -- -- --  TROPONINI <0.30 <0.30 <0.30   Coagulation:  Lab 09/11/11 0500 09/10/11 0530  LABPROT 15.8* 15.5*  INR 1.23 1.20   Anemia Panel:  Lab 09/10/11 0530  VITAMINB12 --  FOLATE --  FERRITIN 1143*  TIBC --  IRON --  RETICCTPCT --   Urine Drug  Screen: Drugs of Abuse  No results found for this basename: labopia, cocainscrnur, labbenz, amphetmu, thcu, labbarb    Alcohol Level: No results found for this basename: ETH:2 in the last 168 hours Urinalysis: No results found for this basename: COLORURINE:2,APPERANCEUR:2,LABSPEC:2,PHURINE:2,GLUCOSEU:2,HGBUR:2,BILIRUBINUR:2,KETONESUR:2,PROTEINUR:2,UROBILINOGEN:2,NITRITE:2,LEUKOCYTESUR:2 in the last 168 hours  Micro Results: Recent Results (from the past 240 hour(s))  MRSA PCR SCREENING     Status: Normal   Collection Time   09/04/11  8:30 PM      Component Value Range Status Comment   MRSA by PCR NEGATIVE  NEGATIVE Final   TECHNOLOGIST SMEAR REVIEW     Status: Normal   Collection Time   09/07/11  5:10 AM      Component Value Range Status Comment   Path Review     Final    Value: PLATELET CLUMPS NOTED ON SMEAR, COUNT APPEARS  ADEQUATE   Studies/Results: Dg Chest 2 View  09/10/2011  *RADIOLOGY REPORT*  Clinical Data: Shortness of breath.  Cough and congestion.  History of pulmonary embolism.  CHEST - 2 VIEW  Comparison: Chest x-ray 09/06/2011.  Chest CT 09/01/2011.  Findings: There is a right upper extremity PICC with tip terminating in the mid superior vena cava. The patient is status post median sternotomyfor CABG with a LIMA. Lung volumes are normal.  Mild elevation of the right hemidiaphragm has increased compared to the prior study.  There continues to be patchy areas of interstitial prominence and areas of airspace consolidation throughout the lungs bilaterally, most pronounced in the right middle and lower lobes.  Mild thickening of the horizontal fissure is again noted.  Small right-sided pleural effusion.  No definite left pleural effusion.  No evidence of pulmonary edema.  Heart size is borderline enlarged (unchanged). The patient is rotated to the right on today's exam, resulting in distortion of the mediastinal contours and reduced diagnostic sensitivity and specificity for mediastinal pathology.  Atherosclerosis in the thoracic aorta.  IMPRESSION: 1.  The appearance of chest is very similar to the recent prior study from 09/06/2011, but overall demonstrates slightly improved aeration.  Findings are suggestive of resolving areas of pulmonary hemorrhage related to resolving areas of pulmonary infarction. 2.  Small right-sided pleural effusion has also slightly decreased in size. 3.  Increased elevation of the right hemidiaphragm. 4.  Postoperative changes and support apparatus, as above. 5.  Atherosclerosis.  Original Report Authenticated By: Florencia Reasons, M.D.   Medications:  I have reviewed the patient's current medications. Prior to Admission:  Prescriptions prior to admission  Medication Sig Dispense Refill  . acetaminophen (TYLENOL) 325 MG tablet Take 325-650 mg by mouth every 4 (four) hours as needed. For  pain/fever      . Ascorbic Acid (VITAMIN C) 100 MG tablet Take 100 mg by mouth daily.      [name] aspirin EC 81 MG EC tablet Take 1 tablet (81 mg total) by mouth daily.      . calcium carbonate (TUMS - DOSED IN MG ELEMENTAL CALCIUM) 500 MG chewable tablet Chew 1 tablet by mouth 3 (three) times daily.      . clonazePAM (KLONOPIN) 0.5 MG tablet Take 1 tablet (0.5 mg total) by mouth 2 (two) times daily as needed for anxiety.  30 tablet  1  . esomeprazole (NEXIUM) 40 MG capsule Take 40 mg by mouth 2 (two) times daily.       . famotidine (PEPCID) 10 MG tablet Take 10 mg by mouth 2 (two) times daily.      [name]  feeding supplement (ENSURE COMPLETE) LIQD Take 237 mLs by mouth 2 (two) times daily between meals.      . Flaxseed, Linseed, 1000 MG CAPS Take 1 capsule (1,000 mg total) by mouth daily.      . furosemide (LASIX) 40 MG tablet Take 1 tablet (40 mg total) by mouth daily.  30 tablet  3  . metoprolol (LOPRESSOR) 50 MG tablet Take 0.5 tablets (25 mg total) by mouth 2 (two) times daily.  30 tablet  1  . Multiple Vitamins-Minerals (MULTIVITAMIN) tablet Take 1 tablet by mouth daily with breakfast.   30 tablet    . oxyCODONE-acetaminophen (PERCOCET) 5-325 MG per tablet Take 1 tablet by mouth every 6 (six) hours as needed for pain. Do not take more than 4000 mg of acetaminophen in 24 hours. Both Tylenol and Percocet contain acetaminophen.  32 tablet  0  . sodium chloride 0.9 % SOLN 100 mL with micafungin 50 MG SOLR 150 mg Inject 150 mg into the vein daily. Dispense quantity sufficient to treat until on 09/26/2011. Patient is to receive Micafungin via PICC line daily. Via home health agency. PROTECT FROM LIGHT - DO NOT SHAKE  34 application  0  . DISCONTD: acetaminophen (TYLENOL) 325 MG tablet Take 1-2 tablets (325-650 mg total) by mouth every 4 (four) hours as needed.      . polyethylene glycol (MIRALAX / GLYCOLAX) packet Take 17 g by mouth daily as needed.  14 each  11   Anti-infectives     Start     Dose/Rate Route  Frequency Ordered Stop   09/09/11 1400   ceFAZolin (ANCEF) IVPB 2 g/50 mL premix  Status:  Discontinued        2 g 100 mL/hr over 30 Minutes Intravenous 3 times per day 09/09/11 1059 09/09/11 1335   09/07/11 1400   ceFEPIme (MAXIPIME) 1 g in dextrose 5 % 50 mL IVPB  Status:  Discontinued        1 g 100 mL/hr over 30 Minutes Intravenous 3 times per day 09/07/11 1327 09/09/11 1058   09/04/11 2000   ciprofloxacin (CIPRO) tablet 500 mg  Status:  Discontinued        500 mg Oral 2 times daily 09/04/11 1410 09/07/11 1306   09/02/11 1400   fluconazole (DIFLUCAN) IVPB 400 mg        400 mg 200 mL/hr over 60 Minutes Intravenous Every 24 hours 09/02/11 1300     09/02/11 1200   vancomycin (VANCOCIN) 1,250 mg in sodium chloride 0.9 % 250 mL IVPB  Status:  Discontinued        1,250 mg 166.7 mL/hr over 90 Minutes Intravenous Every 12 hours 09/02/11 1001 09/03/11 0947   08/31/11 1600   vancomycin (VANCOCIN) IVPB 1000 mg/200 mL premix  Status:  Discontinued        1,000 mg 200 mL/hr over 60 Minutes Intravenous Every 8 hours 08/31/11 1527 09/02/11 1001   08/31/11 1600   piperacillin-tazobactam (ZOSYN) IVPB 3.375 g  Status:  Discontinued        3.375 g 12.5 mL/hr over 240 Minutes Intravenous 3 times per day 08/31/11 1527 09/04/11 1410   08/27/11 2200   micafungin (MYCAMINE) 150 mg in sodium chloride 0.9 % 100 mL IVPB  Status:  Discontinued        150 mg 100 mL/hr over 1 Hours Intravenous Every 24 hours 08/27/11 2031 09/02/11 1300         Scheduled Meds:   . aspirin EC  81 mg Oral Daily  . clonazePAM  0.5 mg Oral Daily  . clonazePAM  1 mg Oral QHS  . docusate sodium  100 mg Oral BID  . feeding supplement  237 mL Oral TID BM  . fluconazole (DIFLUCAN) IV  400 mg Intravenous Q24H  . furosemide  60 mg Oral Daily  . levalbuterol  0.63 mg Nebulization Q6H  . losartan  25 mg Oral Daily  . metoprolol  100 mg Oral BID  . pantoprazole (PROTONIX) IV  40 mg Intravenous Q12H  . sodium chloride  3 mL  Intravenous Q12H  . traZODone  50 mg Oral QHS  . warfarin  5 mg Oral ONCE-1800  . warfarin  7.5 mg Oral ONCE-1800  . Warfarin - Pharmacist Dosing Inpatient   Does not apply q1800   Continuous Infusions:   . sodium chloride 10 mL/hr at 09/06/11 1900  . heparin 2,700 Units/hr (09/11/11 0456)   PRN Meds:.acetaminophen, chlorpheniramine-HYDROcodone, ipratropium, levalbuterol, oxyCODONE-acetaminophen, polyethylene glycol, sodium chloride Assessment/Plan:  Normocytic anemia - Hgb 7.6 today. MCV 81.2. Ferritin = 1143. Hgb has been trending down since admission but he seems to have stabilized in the 7.3-7.6 range. FOBT test positive x1 after BM (patient was constipated for 4 days). 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 Iron = 21, Sat ratio = 9, TIBC =243, Ferritin = 597, Retic count =1.9, FOBT negative. Smear revealed mild anemia with normal morphology. Side effect of micafungin can be suppression of RBC production (3-10%).  - Appreciate GI consult in the management of this patient  - Per GI: conservative management. Monitor Hgb then endoscopy  - Will monitor CBC. Transfuse if Hgb <7 and possible Endoscopy   Distal Femoral Vein thrombosis - INR = 1.23 today. Patient had LE doppler on 09/03/11 that showed DVT in right femoral vein distally. MRI/MRA head on 09/04/11: see read. Notable small bulge proximal A1 segment of left ACA (not a typical place for mycotic aneurysm however)  - Appreciate pharmacy consult in the management of anticoagulation in this patient - heparin per pharmacy, with target lower end of therapeutic range (he is therapeutic as of 09/08/11)  - coumadin per pharmacy (started 09/09/11)  - Will monitor closely for any signs of bleeding. If stable no IVC filer will be placed.  - possible switch to xarelto in this patient pending cost of drug and whether or not affordable by patient  Pleural effusion / pulmonary embolism / pulmonary infarction - CXR 09/10/11 shows normal lung  volumes, mild elevation of R hemidiaphragm, overall improved aeration (e.g. Resolving pulmonary hemorrhage 2/2 infarction). Patient is s/p thoracentesis (08/28/11) for R effusion. Most recent CT chest (09/01/11) showed central right PA Candidal embolus (extension to RML, RLL), and embolus/occlusion of LUL pulmonary artery branches. Moderate right pleural effusion present (possibly loculated). Wedge shaped infarcts noted in RML and LUL.  Pleural fluid culture: Negative, final / Pleural fluid Fungus culture: in progress x 4 weeks (collected 08/28/11): NTD / Pleural fluid AFB culture w/ smear: in progress x 6 weeks (collected 08/28/11): NTD  - continue tussionex (chlorpheniramine/hydrocodone) 10-8mg /58mL WGNFAO - Continue conservative management with pain medication and inhalers  - Continue incentive spirometer to help with expulsion / cough   Bacteriuria - Patient's urine culture revealed Klebsiella pneumoniae. 85,000 col/mL. Urine culture pansensitive except for Amoxicillin. D/c zosyn (08/31/11>>09/04/11). Started Cipro on 6/5>>09/07/11. Then changed to cefepime 09/07/11>>09/09/11 for lower risk of C. Diff for a total of 10 day course.  - abx course completed  Acute on chronic systolic CHF 2/2 CAD and ischemic cardiomyopathy - Improved. 2D echo (08/23/11) shows EF = 30% with diffuse hypokinesis, mild LVH, and moderately dilated LA. 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  - Appreciate cardiology consult in the management of this patient  - continue ASA 81 mg daily  - continue metoprolol 100 mg BID  - continue losartan 25 mg daily  - Continue lasix 60 daily   NSVT - Patient has had recent episodes of NSVT. Most recent significant episode of 47 beat run over 10 seconds 09/04/11 (none since). Currently on Metoprolol tartrate 100 mg BID  - Continue current regimen  - Goal of K>3.9 and Mg>1.9   Tachycardia - Improving slowly. Patient continues to have episodes of tachycardia (90's  -110's). Multifactorial . Currently on metoprolol 100 BID.  - Continue current regimen  - Will continue to monitor   Fever - resolved; Patient had Candidal embolus (from atrial lead on former ICD) to R pulmonary artery on 08/15/11.  blood cultures x 2 : Negative, final. Change micafungin (08/15/11>>09/01/11) to IV fluconazole 400 mg daily to finish 6 week course (08/15/11>>) since cultures sensitive to micafungin and fluconazole  - Will continue to monitor  - Treatment completed 09/09/11 for bacteriuria  - Old pacer pocket (no warmth, erythema, tenderness, disproportionate swelling). Will continue to monitor  - Appreciate ID recommendations and consult in the management of this patient   Hyponatremia - Resolved. Na = . Likely 2/2 his CHF and removal of his biV ICD.  - will continue to monitor   GERD / Esophageal Candidiasis - Stable. He does note some minor discomfort with swallowing, however he also states that the dilation on 08/13/11 was not a complete dilation that he is used to. Of note, plasma concentration of fluconazole is the same for both IV and PO routes. Also, bioavailability is unaltered by food or gastric acidity. Lastly, fluconazole in a potent inhibitor of the CYP450 system, which would also increase efficacy of PPI's, but it would not affect the absorption of fluconazole. - Appreciate Gastroenterology consult in the management of this patient  - continue fluconazole  - continue Protonix 40 mg IV BID, per GI (2/2 potential of esophageal oozing and pill malabsorption)  - Appreciate Pharm D candidates help with looking into interaction of fluconazole with PPI's.  Anxiety / Insomnia - He has had some anxiety at night when trying to sleep, especially with his cough. Given his fall last night, will modify his regimen to allow him to continue Tussionex for cough. - decrease clonazepam to 0.5 mg BID  - d/c trazodone  Insomnia - Stable as long as anxiety is controlled.  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 On SPEP his IgM and IgA were increased during his last admission, which could likely be due to his acute illnesses. Hematology consult during previous admission reccommended follow-up after his current infection is completely resolved.  - repeat SPEP/UPEP after full resolution of Candidal infection   Disposition - Patient doing better today. Pending clinical improvement.   LOS: 15 days   This is a Psychologist, occupational Note.  The care of the patient was discussed with Dr. Margorie John and the assessment and plan formulated with their assistance.  Please see their attached note for official documentation of the daily encounter.  Lewie Chamber 09/11/2011, 8:16 AM  Resident Addendum to Medical Student Note   I have seen and examined the patient, and agree with the the medical student assessment and plan outlined above. Please see my brief note below for additional details.  HPI: Patient slept very well, he felt tussionex helped his cough greatly. Patient did have a fall overnight, he did not hit his head and is unhurt. He otherwise denies any pain or nausea.   OBJECTIVE: VS: Reviewed  Meds: Reviewed  Labs: Reviewed  Imaging: Reviewed   Physical Exam: General: Vital signs reviewed and noted. Well-developed, well-nourished, in no acute distress; alert, appropriate and cooperative throughout examination.  Lungs:  Normal respiratory effort. Patient has inspiratory wheeze/snap sound on right side.  Heart: RRR. S1 and S2 normal without gallop, murmur, or rubs.  Abdomen:  BS normoactive. Soft, Nondistended, non-tender.  No masses or organomegaly.  Extremities: No pretibial edema.     ASSESSMENT/ PLAN:  Anemia- Patient's hemoglobin is stable and even slightly improved. Will transfer patient to telemetry tomorrow   Fall- likely due to oversedation due to trazodone, klonopin and  tussionex. Will stop trazodone and continue klonopin and tussionex since it helped patient's cough greatly. I will consider placing patient on fall precautions but I do not think this is necessary since we are reducing his level of sedation.  Dispo- patient's fluconazole will be quite inexpensive $4/week. Will not require micafungin IV per care mangement.  Length of Stay: 15   Margorie John, M.D. [facility], Internal Medicine Resident 09/11/2011, 1:29 PM

## 2011-09-11 NOTE — Progress Notes (Signed)
Internal Medicine Attending  Date: 09/11/2011  Patient name: [name] Medical record number: [id number] Date of birth: 1945-11-24 Age: 66 y.o. Gender: male  I saw and evaluated the patient. I reviewed the resident's note by Dr. Maisie Fus and I agree with the resident's findings and plans as documented in her note.

## 2011-09-11 NOTE — Progress Notes (Signed)
Report from Night RN. Chart reviewed together. Handoff complete.  

## 2011-09-11 NOTE — Progress Notes (Signed)
Subjective:  Said he coughed less last night.    Objective:  Vital Signs in the last 24 hours: Temp:  [97.4 F (36.3 C)-99.5 F (37.5 C)] 98.6 F (37 C) (06/12 1500) Pulse Rate:  [64-108] 90  (06/12 1700) Resp:  [18-24] 18  (06/12 1500) BP: (85-122)/(47-78) 111/78 mmHg (06/12 1629) SpO2:  [93 %-100 %] 97 % (06/12 1700) Weight:  [176 lb 5.9 oz (80 kg)] 176 lb 5.9 oz (80 kg) (06/12 0458)  Intake/Output from previous day: 06/11 0701 - 06/12 0700 In: 1891 [P.O.:840; I.V.:841; IV Piggyback:210] Out: 1850 [Urine:1850]   Physical Exam: General: pale older gentleman in no distress.   Head:  Normocephalic and atraumatic. Lungs: decrease BS and ronchii/some rales R base Heart: Normal S1 and S2.  S4 gallop Extremities: No clubbing or cyanosis. No edema. Neurologic: Alert and oriented x 3.    Lab Results:  Basename 09/11/11 0500 09/10/11 0530  WBC 7.8 7.8  HGB 7.6* 7.4*  PLT 432* 414*    Basename 09/10/11 0530  NA 134*  K 4.6  CL 95*  CO2 31  GLUCOSE 113*  BUN 13  CREATININE 0.80   No results found for this basename: TROPONINI:2,CK,MB:2 in the last 72 hours Hepatic Function Panel  Basename 09/10/11 0530  PROT 7.6  ALBUMIN 2.0*  AST 24  ALT 25  ALKPHOS 93  BILITOT 0.1*  BILIDIR <0.1  IBILI NOT CALCULATED   No results found for this basename: CHOL in the last 72 hours No results found for this basename: PROTIME in the last 72 hours  Imaging: Dg Chest 2 View  09/10/2011  *RADIOLOGY REPORT*  Clinical Data: Shortness of breath.  Cough and congestion.  History of pulmonary embolism.  CHEST - 2 VIEW  Comparison: Chest x-ray 09/06/2011.  Chest CT 09/01/2011.  Findings: There is a right upper extremity PICC with tip terminating in the mid superior vena cava. The patient is status post median sternotomyfor CABG with a LIMA. Lung volumes are normal.  Mild elevation of the right hemidiaphragm has increased compared to the prior study.  There continues to be patchy areas of  interstitial prominence and areas of airspace consolidation throughout the lungs bilaterally, most pronounced in the right middle and lower lobes.  Mild thickening of the horizontal fissure is again noted.  Small right-sided pleural effusion.  No definite left pleural effusion.  No evidence of pulmonary edema.  Heart size is borderline enlarged (unchanged). The patient is rotated to the right on today's exam, resulting in distortion of the mediastinal contours and reduced diagnostic sensitivity and specificity for mediastinal pathology.  Atherosclerosis in the thoracic aorta.  IMPRESSION: 1.  The appearance of chest is very similar to the recent prior study from 09/06/2011, but overall demonstrates slightly improved aeration.  Findings are suggestive of resolving areas of pulmonary hemorrhage related to resolving areas of pulmonary infarction. 2.  Small right-sided pleural effusion has also slightly decreased in size. 3.  Increased elevation of the right hemidiaphragm. 4.  Postoperative changes and support apparatus, as above. 5.  Atherosclerosis.  Original Report Authenticated By: Florencia Reasons, M.D.      Assessment/Plan:  Patient Active Hospital Problem List: Chronic systolic congestive heart failure (03/20/2010)   Assessment: stable status at present   Plan: continue current treatment Normocytic anemia (07/12/2011)   Assessment: significant   Plan: may need to reduce blood draws for it to rise. Consider peds tubes.  V-tach (09/05/2011)   Assessment: continue beta blockade   Plan: replace  ICD when infection cleared Pulmonary embolism, septic (08/27/2011)   Assessment: see CXR --RLL findings.    DVT (deep venous thrombosis) (09/03/2011)   Assessment: remains on IV heparin   Plan: per primary team.        Shawnie Pons, MD, [facility], [facility] 09/11/2011, 6:38 PM

## 2011-09-11 NOTE — Progress Notes (Signed)
ANTICOAGULATION CONSULT NOTE - Follow Up Consult  Pharmacy Consult for heparin, coumasin Indication: DVT and septic lung emboli  Labs:  [facility] 09/11/11 0500 09/10/11 0530 09/09/11 0615  HGB 7.6* 7.4* --  HCT 25.4* 24.7* 25.1*  PLT 432* 414* 406*  APTT -- -- --  LABPROT 15.8* 15.5* --  INR 1.23 1.20 --  HEPARINUNFRC 0.39 0.42 0.38  CREATININE -- 0.80 --  CKTOTAL -- -- --  CKMB -- -- --  TROPONINI -- -- --    Assessment: 66yo male now therapeutic (HL= 0.42) on heparin at 2700 units/hour) LE doppler on 09/03/11 that showed DVT in right femoral vein distally). Hg=7.4 and low but stable. Patient also on coumadin day 3  INR=1.23 and noted on fluconazole (significant interaction with coumadin)  Goal of Therapy:  Heparin level 0.3-0.5 units/ml Platlet monitoring per protocol: yes INR=2-3   Plan:  1. Continue heparin at 2700 units/hour 2. Coumadin 7.55mg  po x1 today 3. PT/INR, CBC and heparin levels daily 4. Consider changing protonix to po?  Benny Lennert PharmD 09/11/2011,8:12 AM

## 2011-09-12 LAB — BASIC METABOLIC PANEL
BUN: 12 mg/dL (ref 6–23)
Chloride: 95 mEq/L — ABNORMAL LOW (ref 96–112)
GFR calc Af Amer: >90 mL/min (ref >90)
GFR calc non Af Amer: >90 mL/min (ref >90)
Potassium: 4.3 mEq/L (ref 3.5–5.1)
Sodium: 134 mEq/L — ABNORMAL LOW (ref 135–145)

## 2011-09-12 LAB — CBC
Hemoglobin: 7.8 g/dL — ABNORMAL LOW (ref 13.0–17.0)
MCH: 24.6 pg — ABNORMAL LOW (ref 26.0–34.0)
RBC: 3.17 MIL/uL — ABNORMAL LOW (ref 4.22–5.81)
WBC: 6.1 10*3/uL (ref 4.0–10.5)

## 2011-09-12 LAB — PROTIME-INR: Prothrombin Time: 17.7 seconds — ABNORMAL HIGH (ref 11.6–15.2)

## 2011-09-12 MED ORDER — HYDROCOD POLST-CHLORPHEN POLST 10-8 MG/5ML PO LQCR: 2.5000 mL | Freq: Two times a day (BID) | ORAL | Status: DC | PRN

## 2011-09-12 MED ORDER — RIVAROXABAN 15 MG PO TABS
15.0000 mg | ORAL_TABLET | Freq: Two times a day (BID) | ORAL | Status: DC
  Administered 2011-09-12 – 2011-09-14 (×4): 15 mg via ORAL
  Filled 2011-09-12 (×6): qty 1

## 2011-09-12 MED ORDER — HEPARIN (PORCINE) IN NACL 100-0.45 UNIT/ML-% IJ SOLN
2600.0000 [IU]/h | INTRAMUSCULAR | Status: AC
  Administered 2011-09-12: 2600 [IU]/h via INTRAVENOUS
  Filled 2011-09-12: qty 250

## 2011-09-12 NOTE — Progress Notes (Signed)
Physical Therapy Treatment and Discharge Note.  Patient Details Name: [name] MRN: [id number] DOB: 1946-01-11 Today's Date: 09/12/2011 Time: 4098-1191 PT Time Calculation (min): 24 min  PT Assessment / Plan / Recommendation Comments on Treatment Session       Follow Up Recommendations  No PT follow up    Barriers to Discharge        Equipment Recommendations       Recommendations for Other Services    Frequency Min 3X/week   Plan All goals met and education completed, patient dischaged from PT services    Precautions / Restrictions Precautions Precautions: Fall Restrictions Weight Bearing Restrictions: No   Pertinent Vitals/Pain Pt denied pain.      Mobility  Bed Mobility Bed Mobility: Rolling Right;Right Sidelying to Sit;Sitting - Scoot to [place] of Bed;Sit to Supine Rolling Right: 7: Independent Right Sidelying to Sit: 7: Independent Supine to Sit: 6: Modified independent (Device/Increase time) Sitting - Scoot to Edge of Bed: 7: Independent Sit to Supine: 7: Independent Transfers Transfers: Sit to Stand;Stand to Sit Sit to Stand: 7: Independent Stand to Sit: 7: Independent Ambulation/Gait Ambulation/Gait Assistance: 5: Supervision;7: Independent Ambulation Distance (Feet): 400 Feet Assistive device: None Ambulation/Gait Assistance Details: no assistance required Gait Pattern: Within Functional Limits Gait velocity: [facility] Wheelchair Mobility Wheelchair Mobility: No    Exercises General Exercises - Lower Extremity Ankle Circles/Pumps: AROM;15 reps;Both;Seated Long Arc Quad: AROM;Strengthening;Both;10 reps;Seated;Other (comment) Straight Leg Raises: AROM;Both;10 reps;Seated Hip Flexion/Marching: AROM;Seated;10 reps;Both   PT Diagnosis:    PT Problem List:   PT Treatment Interventions:     PT Goals Acute Rehab PT Goals PT Goal Formulation: With patient Time For Goal Achievement: 09/20/11 Potential to Achieve Goals: Good Pt will go Supine/Side to  Sit: with modified independence;with HOB 0 degrees PT Goal: Supine/Side to Sit - Progress: Met Pt will go Sit to Stand: with modified independence;without upper extremity assist PT Goal: Sit to Stand - Progress: Met Pt will go Stand to Sit: with modified independence;without upper extremity assist PT Goal: Stand to Sit - Progress: Met Pt will Transfer Bed to Chair/Chair to Bed: with supervision PT Transfer Goal: Bed to Chair/Chair to Bed - Progress: Met Pt will Stand: 1 - 2 min;with unilateral upper extremity support;with supervision PT Goal: Stand - Progress: Met Pt will Ambulate: >150 feet;with modified independence;with least restrictive assistive device;with gait velocity >(comment) ft/second PT Goal: Ambulate - Progress: Met Pt will Perform Home Exercise Program: Independently PT Goal: Perform Home Exercise Program - Progress: Met  Visit Information  Last PT Received On: 09/12/11    Subjective Data  Subjective: Sore when I cough.     Cognition  Overall Cognitive Status: Appears within functional limits for tasks assessed/performed Arousal/Alertness: Awake/alert Orientation Level: Appears intact for tasks assessed Behavior During Session: [facility] for tasks performed    Balance  Balance Balance Assessed: Yes Static Standing Balance Static Standing - Balance Support: No upper extremity supported Static Standing - Level of Assistance: 7: Independent Dynamic Standing Balance Dynamic Standing - Balance Support: During functional activity;Bilateral upper extremity supported Dynamic Standing - Level of Assistance: 7: Independent  End of Session PT - End of Session Activity Tolerance: Patient tolerated treatment well Patient left: in chair;with call bell/phone within reach;with family/visitor present Nurse Communication: Mobility status    [name] 09/12/2011, 3:00 PM [name] L. Mohammed Mcandrew DPT [phone]

## 2011-09-12 NOTE — Progress Notes (Signed)
Medical Student Daily Progress Note  Subjective: Patient is doing better each day. His spirits continue to be high daily. He slept very well last night and did not need his Tussionex to sleep. He used his clonazepam 1mg  and percocet which helped with his cough. He has been up and walking around with a rolling walker with no problems. He has no new complaints and his breathing has continued to get better daily. Patient denies any CP, SOB, N/V/D, or constipation. He is sitting up in chair resting on exam. His wife is bedside.  Objective: Vital signs in last 24 hours: Filed Vitals:   09/12/11 0757 09/12/11 0834 09/12/11 1142 09/12/11 1146  BP: 123/63   104/56  Pulse: 112   91  Temp: 99.1 F (37.3 C)   99 F (37.2 C)  TempSrc: Oral  Oral Oral  Resp: 20     Height:      Weight: 78.9 kg (173 lb 15.1 oz)     SpO2: 95% 96%  91%   Weight change:   Intake/Output Summary (Last 24 hours) at 09/12/11 1312 Last data filed at 09/12/11 1100  Gross per 24 hour  Intake   1303 ml  Output   1925 ml  Net   -622 ml   Physical Exam: BP 104/56  Pulse 91  Temp 99 F (37.2 C) (Oral)  Resp 20  Ht 5\' 10"  (1.778 m)  Wt 78.9 kg (173 lb 15.1 oz)  BMI 24.96 kg/m2  SpO2 91% General appearance: alert, cooperative and no distress Head: Normocephalic, without obvious abnormality, atraumatic Neck: no adenopathy, no carotid bruit, no JVD, supple, symmetrical, trachea midline and thyroid not enlarged, symmetric, no tenderness/mass/nodules Back: symmetric, no curvature. ROM normal. No CVA tenderness. Lungs: diminished breath sounds RLL and dry rales RML Chest wall: no tenderness Heart: regular rate and rhythm, S1, S2 normal, no murmur, click, rub or gallop Abdomen: soft, non-tender; bowel sounds normal; no masses,  no organomegaly Extremities: extremities normal, atraumatic, no cyanosis or edema Skin: Skin color, texture, turgor normal. No rashes or lesions Lab Results: Basic Metabolic Panel:  Lab  09/12/11 0655 09/10/11 0530  NA 134* 134*  K 4.3 4.6  CL 95* 95*  CO2 28 31  GLUCOSE 93 113*  BUN 12 13  CREATININE 0.75 0.80  CALCIUM 9.6 9.5  MG -- 2.3  PHOS -- --   Liver Function Tests:  Lab 09/10/11 0530  AST 24  ALT 25  ALKPHOS 93  BILITOT 0.1*  PROT 7.6  ALBUMIN 2.0*   No results found for this basename: LIPASE:2,AMYLASE:2 in the last 168 hours No results found for this basename: AMMONIA:2 in the last 168 hours CBC:  Lab 09/12/11 0655 09/11/11 0500  WBC 6.1 7.8  NEUTROABS -- --  HGB 7.8* 7.6*  HCT 26.0* 25.4*  MCV 82.0 81.2  PLT 368 432*   Cardiac Enzymes:  Lab 09/06/11 0410 09/05/11 2322 09/05/11 1830  CKTOTAL 8 8 14   CKMB 1.2 1.2 1.0  CKMBINDEX -- -- --  TROPONINI <0.30 <0.30 <0.30   BNP: No results found for this basename: PROBNP:3 in the last 168 hours D-Dimer: No results found for this basename: DDIMER:2 in the last 168 hours CBG: No results found for this basename: GLUCAP:6 in the last 168 hours Hemoglobin A1C: No results found for this basename: HGBA1C in the last 168 hours Fasting Lipid Panel: No results found for this basename: CHOL,HDL,LDLCALC,TRIG,CHOLHDL,LDLDIRECT in the last 409 hours Thyroid Function Tests: No results found for this  basename: TSH,T4TOTAL,FREET4,T3FREE,THYROIDAB in the last 168 hours Coagulation:  Lab 09/12/11 0655 09/11/11 0500 09/10/11 0530  LABPROT 17.7* 15.8* 15.5*  INR 1.43 1.23 1.20   Anemia Panel:  Lab 09/10/11 0530  VITAMINB12 --  FOLATE --  FERRITIN 1143*  TIBC --  IRON --  RETICCTPCT --   Urine Drug Screen: Drugs of Abuse  No results found for this basename: labopia, cocainscrnur, labbenz, amphetmu, thcu, labbarb    Alcohol Level: No results found for this basename: ETH:2 in the last 168 hours Urinalysis: No results found for this basename: COLORURINE:2,APPERANCEUR:2,LABSPEC:2,PHURINE:2,GLUCOSEU:2,HGBUR:2,BILIRUBINUR:2,KETONESUR:2,PROTEINUR:2,UROBILINOGEN:2,NITRITE:2,LEUKOCYTESUR:2 in the last  168 hours  Micro Results: Recent Results (from the past 240 hour(s))  MRSA PCR SCREENING     Status: Normal   Collection Time   09/04/11  8:30 PM      Component Value Range Status Comment   MRSA by PCR NEGATIVE  NEGATIVE Final   TECHNOLOGIST SMEAR REVIEW     Status: Normal   Collection Time   09/07/11  5:10 AM      Component Value Range Status Comment   Path Review     Final    Value: PLATELET CLUMPS NOTED ON SMEAR, COUNT APPEARS ADEQUATE   Studies/Results: Dg Chest 2 View  09/10/2011  *RADIOLOGY REPORT*  Clinical Data: Shortness of breath.  Cough and congestion.  History of pulmonary embolism.  CHEST - 2 VIEW  Comparison: Chest x-ray 09/06/2011.  Chest CT 09/01/2011.  Findings: There is a right upper extremity PICC with tip terminating in the mid superior vena cava. The patient is status post median sternotomyfor CABG with a LIMA. Lung volumes are normal.  Mild elevation of the right hemidiaphragm has increased compared to the prior study.  There continues to be patchy areas of interstitial prominence and areas of airspace consolidation throughout the lungs bilaterally, most pronounced in the right middle and lower lobes.  Mild thickening of the horizontal fissure is again noted.  Small right-sided pleural effusion.  No definite left pleural effusion.  No evidence of pulmonary edema.  Heart size is borderline enlarged (unchanged). The patient is rotated to the right on today's exam, resulting in distortion of the mediastinal contours and reduced diagnostic sensitivity and specificity for mediastinal pathology.  Atherosclerosis in the thoracic aorta.  IMPRESSION: 1.  The appearance of chest is very similar to the recent prior study from 09/06/2011, but overall demonstrates slightly improved aeration.  Findings are suggestive of resolving areas of pulmonary hemorrhage related to resolving areas of pulmonary infarction. 2.  Small right-sided pleural effusion has also slightly decreased in size. 3.   Increased elevation of the right hemidiaphragm. 4.  Postoperative changes and support apparatus, as above. 5.  Atherosclerosis.  Original Report Authenticated By: Florencia Reasons, M.D.   Medications:  I have reviewed the patient's current medications. Prior to Admission:  Prescriptions prior to admission  Medication Sig Dispense Refill  . acetaminophen (TYLENOL) 325 MG tablet Take 325-650 mg by mouth every 4 (four) hours as needed. For pain/fever       . Ascorbic Acid (VITAMIN C) 100 MG tablet Take 100 mg by mouth daily.      [name] aspirin EC 81 MG EC tablet Take 1 tablet (81 mg total) by mouth daily.      . calcium carbonate (TUMS - DOSED IN MG ELEMENTAL CALCIUM) 500 MG chewable tablet Chew 1 tablet by mouth 3 (three) times daily.      . clonazePAM (KLONOPIN) 0.5 MG tablet Take 1 tablet (0.5 mg total)  by mouth 2 (two) times daily as needed for anxiety.  30 tablet  1  . esomeprazole (NEXIUM) 40 MG capsule Take 40 mg by mouth 2 (two) times daily.       . famotidine (PEPCID) 10 MG tablet Take 10 mg by mouth 2 (two) times daily.      . feeding supplement (ENSURE COMPLETE) LIQD Take 237 mLs by mouth 2 (two) times daily between meals.      . Flaxseed, Linseed, 1000 MG CAPS Take 1 capsule (1,000 mg total) by mouth daily.      . furosemide (LASIX) 40 MG tablet Take 1 tablet (40 mg total) by mouth daily.  30 tablet  3  . metoprolol (LOPRESSOR) 50 MG tablet Take 0.5 tablets (25 mg total) by mouth 2 (two) times daily.  30 tablet  1  . Multiple Vitamins-Minerals (MULTIVITAMIN) tablet Take 1 tablet by mouth daily with breakfast.   30 tablet    . oxyCODONE-acetaminophen (PERCOCET) 5-325 MG per tablet Take 1 tablet by mouth every 6 (six) hours as needed for pain. Do not take more than 4000 mg of acetaminophen in 24 hours. Both Tylenol and Percocet contain acetaminophen.  32 tablet  0  . sodium chloride 0.9 % SOLN 100 mL with micafungin 50 MG SOLR 150 mg Inject 150 mg into the vein daily. Dispense quantity  sufficient to treat until on 09/26/2011. Patient is to receive Micafungin via PICC line daily. Via home health agency. PROTECT FROM LIGHT - DO NOT SHAKE  34 application  0  . DISCONTD: acetaminophen (TYLENOL) 325 MG tablet Take 1-2 tablets (325-650 mg total) by mouth every 4 (four) hours as needed.      . polyethylene glycol (MIRALAX / GLYCOLAX) packet Take 17 g by mouth daily as needed.  14 each  11   Anti-infectives     Start     Dose/Rate Route Frequency Ordered Stop   09/09/11 1400   ceFAZolin (ANCEF) IVPB 2 g/50 mL premix  Status:  Discontinued        2 g 100 mL/hr over 30 Minutes Intravenous 3 times per day 09/09/11 1059 09/09/11 1335   09/07/11 1400   ceFEPIme (MAXIPIME) 1 g in dextrose 5 % 50 mL IVPB  Status:  Discontinued        1 g 100 mL/hr over 30 Minutes Intravenous 3 times per day 09/07/11 1327 09/09/11 1058   09/04/11 2000   ciprofloxacin (CIPRO) tablet 500 mg  Status:  Discontinued        500 mg Oral 2 times daily 09/04/11 1410 09/07/11 1306   09/02/11 1400   fluconazole (DIFLUCAN) IVPB 400 mg        400 mg 200 mL/hr over 60 Minutes Intravenous Every 24 hours 09/02/11 1300     09/02/11 1200   vancomycin (VANCOCIN) 1,250 mg in sodium chloride 0.9 % 250 mL IVPB  Status:  Discontinued        1,250 mg 166.7 mL/hr over 90 Minutes Intravenous Every 12 hours 09/02/11 1001 09/03/11 0947   08/31/11 1600   vancomycin (VANCOCIN) IVPB 1000 mg/200 mL premix  Status:  Discontinued        1,000 mg 200 mL/hr over 60 Minutes Intravenous Every 8 hours 08/31/11 1527 09/02/11 1001   08/31/11 1600   piperacillin-tazobactam (ZOSYN) IVPB 3.375 g  Status:  Discontinued        3.375 g 12.5 mL/hr over 240 Minutes Intravenous 3 times per day 08/31/11 1527 09/04/11 1410  08/27/11 2200   micafungin (MYCAMINE) 150 mg in sodium chloride 0.9 % 100 mL IVPB  Status:  Discontinued        150 mg 100 mL/hr over 1 Hours Intravenous Every 24 hours 08/27/11 2031 09/02/11 1300         Scheduled  Meds:   . aspirin EC  81 mg Oral Daily  . clonazePAM  0.5 mg Oral Daily  . clonazePAM  1 mg Oral QHS  . docusate sodium  100 mg Oral BID  . feeding supplement  237 mL Oral TID BM  . fluconazole (DIFLUCAN) IV  400 mg Intravenous Q24H  . furosemide  60 mg Oral Daily  . levalbuterol  0.63 mg Nebulization BID  . losartan  25 mg Oral Daily  . metoprolol  100 mg Oral BID  . pantoprazole (PROTONIX) IV  40 mg Intravenous Q12H  . sodium chloride  3 mL Intravenous Q12H  . warfarin  7.5 mg Oral ONCE-1800  . Warfarin - Pharmacist Dosing Inpatient   Does not apply q1800  . DISCONTD: levalbuterol  0.63 mg Nebulization Q6H  . DISCONTD: traZODone  50 mg Oral QHS   Continuous Infusions:   . sodium chloride 10 mL/hr at 09/11/11 1900  . heparin 2,600 Units/hr (09/12/11 1109)  . DISCONTD: heparin 2,700 Units/hr (09/12/11 0040)   PRN Meds:.acetaminophen, chlorpheniramine-HYDROcodone, ipratropium, levalbuterol, oxyCODONE-acetaminophen, polyethylene glycol, sodium chloride Assessment/Plan:  Normocytic anemia - Hgb 7.8 today. MCV 82. Ferritin = 1143. Hgb has been trending down since admission but he seems to have stabilized in the 7.3-7.6 range. FOBT test positive x1 after BM (patient was constipated for 4 days). 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 Iron = 21, Sat ratio = 9, TIBC =243, Ferritin = 597, Retic count =1.9, FOBT negative. Smear revealed mild anemia with normal morphology. Side effect of micafungin can be suppression of RBC production (3-10%).  - Appreciate GI consult in the management of this patient - stable  - Per GI: conservative management. Monitor Hgb then endoscopy  - Will monitor CBC. Transfuse if Hgb <7 and possible Endoscopy   Distal Femoral Vein thrombosis - INR = 1.43 today. Patient had LE doppler on 09/03/11 that showed DVT in right femoral vein distally. MRI/MRA head on 09/04/11: see read. Notable small bulge proximal A1 segment of left ACA (not a typical place for  mycotic aneurysm however)  - Appreciate pharmacy consult in the management of anticoagulation in this patient  - d/c heparin and coumadin - start xarelto at 5 pm today (15mg  BID x 21 days, then 20 mg daily) - Will monitor closely for any signs of bleeding. If stable no IVC filer will be placed.   Pleural effusion / pulmonary embolism / pulmonary infarction - CXR 09/10/11 shows normal lung volumes, mild elevation of R hemidiaphragm, overall improved aeration (e.g. Resolving pulmonary hemorrhage 2/2 infarction). Patient is s/p thoracentesis (08/28/11) for R effusion. Most recent CT chest (09/01/11) showed central right PA Candidal embolus (extension to RML, RLL), and embolus/occlusion of LUL pulmonary artery branches. Moderate right pleural effusion present (possibly loculated). Wedge shaped infarcts noted in RML and LUL.  Pleural fluid culture: Negative, final / Pleural fluid Fungus culture: in progress x 4 weeks (collected 08/28/11): NTD / Pleural fluid AFB culture w/ smear: in progress x 6 weeks (collected 08/28/11): NTD  - continue tussionex (chlorpheniramine/hydrocodone) 10-8mg /28mL [id number]  - Continue conservative management with pain medication and inhalers  - Continue incentive spirometer to help with expulsion / cough  Bacteriuria - Patient's urine culture revealed Klebsiella pneumoniae. 85,000 col/mL. Urine culture pansensitive except for Amoxicillin. D/c zosyn (08/31/11>>09/04/11). Started Cipro on 6/5>>09/07/11. Then changed to cefepime 09/07/11>>09/09/11 for lower risk of C. Diff for a total of 10 day course.  - abx course completed   Acute on chronic systolic CHF 2/2 CAD and ischemic cardiomyopathy - Improved. 2D echo (08/23/11) shows EF = 30% with diffuse hypokinesis, mild LVH, and moderately dilated LA. 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  - Appreciate cardiology consult in the management of this patient  - continue ASA 81 mg daily  - continue metoprolol 100 mg  BID  - continue losartan 25 mg daily  - Continue lasix 60 daily   NSVT - Patient has had recent episodes of NSVT. Most recent significant episode of 47 beat run over 10 seconds 09/04/11 (none since). Currently on Metoprolol tartrate 100 mg BID  - Continue current regimen  - Goal of K>3.9 and Mg>1.9   Tachycardia - Improving slowly. Patient continues to have some episodes of tachycardia (90's -110's). Multifactorial . Currently on metoprolol 100 BID.  - Continue current regimen  - Will continue to monitor   Fever - resolved; Patient had Candidal embolus (from atrial lead on former ICD) to R pulmonary artery on 08/15/11.  blood cultures x 2 : Negative, final. Change micafungin (08/15/11>>09/01/11) to IV fluconazole 400 mg daily to finish 6 week course (08/15/11>>) since cultures sensitive to micafungin and fluconazole  - Will continue to monitor  - Treatment completed 09/09/11 for bacteriuria  - Old pacer pocket (no warmth, erythema, tenderness, disproportionate swelling). Will continue to monitor  - Appreciate ID recommendations and consult in the management of this patient   Hyponatremia - Resolved. Na = 134. Likely 2/2 his CHF and removal of his biV ICD.  - will continue to monitor   GERD / Esophageal Candidiasis - Stable. He does note some minor discomfort with swallowing, however he also states that the dilation on 08/13/11 was not a complete dilation that he is used to. Of note, plasma concentration of fluconazole is the same for both IV and PO routes. Also, bioavailability is unaltered by food or gastric acidity. Lastly, fluconazole in a potent inhibitor of the CYP450 system, which would also increase efficacy of PPI's, but it would not affect the absorption of fluconazole.  - Appreciate Gastroenterology consult in the management of this patient  - continue fluconazole  - continue Protonix 40 mg IV BID, per GI (2/2 potential of esophageal oozing and pill malabsorption)  - Appreciate Pharm D  candidates help with looking into interaction of fluconazole with PPI's.   Anxiety / Insomnia - He has had some anxiety at night when trying to sleep, especially with his cough.  - continue clonazepam 1 mg QHS and 0.5 mg daily  - continue Tussionex BIDPRN  Insomnia - Stable as long as anxiety is controlled.  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 On SPEP his IgM and IgA were increased during his last admission, which could likely be due to his acute illnesses. Hematology consult during previous admission reccommended follow-up after his current infection is completely resolved.  - repeat SPEP/UPEP after full resolution of Candidal infection   Disposition - Patient doing better today. Transfer to tele. Pending clinical improvement, possible discharge on Saturday or early next week.   LOS: 16 days   This is  a Medical Student Note.  The care of the patient was discussed with Dr. Margorie John and the assessment and plan formulated with their assistance.  Please see their attached note for official documentation of the daily encounter.  Lewie Chamber 09/12/2011, 1:12 PM    Resident Addendum to Medical Student Note   I have seen and examined the patient, and agree with the the medical student assessment and plan outlined above. Please see my brief note below for additional details.  HPI: Patient feels very well today. Did not have any coughing and did not take tussionex. Wife was asking if he can have half dose of tussionex instead.   OBJECTIVE: VS: Reviewed  Meds: Reviewed  Labs: Reviewed  Imaging: Reviewed   Physical Exam: General: Vital signs reviewed and noted. Well-developed, well-nourished, in no acute distress; alert, appropriate and cooperative throughout examination.  Lungs:  Normal respiratory effort. Clear to auscultation BL without crackles or wheezes.  Heart: RRR. S1 and S2 normal without gallop, murmur, or rubs.   Abdomen:  BS normoactive. Soft, Nondistended, non-tender.  No masses or organomegaly.  Extremities: No pretibial edema.     ASSESSMENT/ PLAN: Anemia- hemoglobin is stable- slightly improved 7.8  DVT/PE- patient's preference is for xarelto instead of coumadin    -will stop coumadin               -will stop heparin at 5pm with first dose of xarelto today               -xarelto will be 15mg  BID for 21 total days and then 20mg  daily thereafter  Fungemia- continue fluconazole IV for total of 6 weeks   Length of Stay: 44  Margorie John, M.D. [facility], Internal Medicine Resident 09/12/2011, 2:11 PM

## 2011-09-12 NOTE — Progress Notes (Signed)
ANTICOAGULATION CONSULT NOTE - Follow Up Consult  Pharmacy Consult for heparin, coumasin Indication: DVT and septic lung emboli  Labs:  [facility] 09/12/11 0655 09/11/11 0500 09/10/11 0530  HGB 7.8* 7.6* --  HCT 26.0* 25.4* 24.7*  PLT 368 432* 414*  APTT -- -- --  LABPROT 17.7* 15.8* 15.5*  INR 1.43 1.23 1.20  HEPARINUNFRC 0.65 0.39 0.42  CREATININE 0.75 -- 0.80  CKTOTAL -- -- --  CKMB -- -- --  TROPONINI -- -- --    Assessment: 66yo male now therapeutic (HL= 0.65) on heparin at 2700 units/hour) LE doppler on 09/03/11 that showed DVT in right femoral vein distally) and also noted with fungal PE. Hg=7.8 and low but stable. Patient also on coumadin day 4  INR=1.43 and noted on fluconazole (significant interaction with coumadin). Coumadin was increased to 7.5mg  on 09/11/11.  Goal of Therapy:  Heparin level 0.3-0.5 units/ml [place] monitoring per protocol: yes INR=2-3   Plan:  1. Decrease heparin at 2600 units/hour and re-check in am 2. Coumadin 7.5 mg po x1 today 3. PT/INR, CBC and heparin levels daily  Benny Lennert PharmD 09/12/2011,9:57 AM

## 2011-09-12 NOTE — Progress Notes (Signed)
Subjective:  Good spirits.  No major VT yesterday.  Appears to be doing some better.   Objective:  Vital Signs in the last 24 hours: Temp:  [98 F (36.7 C)-99.1 F (37.3 C)] 99.1 F (37.3 C) (06/13 0757) Pulse Rate:  [75-112] 112  (06/13 0757) Resp:  [18-20] 20  (06/13 0757) BP: (85-123)/(58-78) 123/63 mmHg (06/13 0757) SpO2:  [93 %-98 %] 96 % (06/13 0834) Weight:  [173 lb 15.1 oz (78.9 kg)] 173 lb 15.1 oz (78.9 kg) (06/13 0757)  Intake/Output from previous day: 06/12 0701 - 06/13 0700 In: 898 [I.V.:888; IV Piggyback:10] Out: 1600 [Urine:1600]   Physical Exam:  Weight 181 to 173 from 5/31 to 6/13 General: Well developed, well nourished, somewhat pales Head:  Normocephalic and atraumatic. Lungs: Decrease BS in the right base Heart: Normal S1 and S2. S4 gallop.  Extremities: No clubbing or cyanosis. No edema. Neurologic: Alert and oriented x 3.    Lab Results:  K 4.3   INR 1.23  Basename 09/12/11 0655 09/11/11 0500  WBC 6.1 7.8  HGB 7.8* 7.6*  PLT 368 432*    Basename 09/12/11 0655 09/10/11 0530  NA 134* 134*  K 4.3 4.6  CL 95* 95*  CO2 28 31  GLUCOSE 93 113*  BUN 12 13  CREATININE 0.75 0.80   No results found for this basename: TROPONINI:2,CK,MB:2 in the last 72 hours Hepatic Function Panel  Basename 09/10/11 0530  PROT 7.6  ALBUMIN 2.0*  AST 24  ALT 25  ALKPHOS 93  BILITOT 0.1*  BILIDIR <0.1  IBILI NOT CALCULATED   No results found for this basename: CHOL in the last 72 hours No results found for this basename: PROTIME in the last 72 hours  Imaging: Dg Chest 2 View  09/10/2011  *RADIOLOGY REPORT*  Clinical Data: Shortness of breath.  Cough and congestion.  History of pulmonary embolism.  CHEST - 2 VIEW  Comparison: Chest x-ray 09/06/2011.  Chest CT 09/01/2011.  Findings: There is a right upper extremity PICC with tip terminating in the mid superior vena cava. The patient is status post median sternotomyfor CABG with a LIMA. Lung volumes are  normal.  Mild elevation of the right hemidiaphragm has increased compared to the prior study.  There continues to be patchy areas of interstitial prominence and areas of airspace consolidation throughout the lungs bilaterally, most pronounced in the right middle and lower lobes.  Mild thickening of the horizontal fissure is again noted.  Small right-sided pleural effusion.  No definite left pleural effusion.  No evidence of pulmonary edema.  Heart size is borderline enlarged (unchanged). The patient is rotated to the right on today's exam, resulting in distortion of the mediastinal contours and reduced diagnostic sensitivity and specificity for mediastinal pathology.  Atherosclerosis in the thoracic aorta.  IMPRESSION: 1.  The appearance of chest is very similar to the recent prior study from 09/06/2011, but overall demonstrates slightly improved aeration.  Findings are suggestive of resolving areas of pulmonary hemorrhage related to resolving areas of pulmonary infarction. 2.  Small right-sided pleural effusion has also slightly decreased in size. 3.  Increased elevation of the right hemidiaphragm. 4.  Postoperative changes and support apparatus, as above. 5.  Atherosclerosis.  Original Report Authenticated By: Florencia Reasons, M.D.     Assessment/Plan:  Patient Active Hospital Problem List: Chronic systolic congestive heart failure (03/20/2010)   Assessment: weight is down overall   Plan: continue beta blocker, ARB, and furosemide.  Watch K.  Normocytic anemia (  07/12/2011)   Assessment: improving   Plan: need to reduce blood draws to minimum needed per day Septic embolism (08/15/2011)   Assessment: RLL densities   Plan: recheck CXR per ID  Malnutrition (08/30/2011)   Assessment: eating pretty well   Plan: encourage V-tach (09/05/2011)   Assessment: No recurrent VT.  K normal.  Beta blocker presently per Dr. Johney Frame   Plan: see above DVT (deep venous thrombosis) (09/03/2011)   Assessment: on  heparin   Plan: conversion to warfarin while watching Hgb.  See above notes.       Shawnie Pons, MD, [facility], FSCAI 09/12/2011, 9:02 AM

## 2011-09-12 NOTE — Progress Notes (Signed)
Internal Medicine Attending  Date: 09/12/2011  Patient name: [name] Medical record number: [id number] Date of birth: Sep 26, 1945 Age: 66 y.o. Gender: male  I saw and evaluated the patient. I reviewed the resident's note by Dr. Maisie Fus and I agree with the resident's findings and plans as documented in her note.

## 2011-09-12 NOTE — Progress Notes (Signed)
On hourly rounding, pt was having another coughing spell; pt refused tussionex and requested a percocet saying that it sometimes helps him calm down; will continue to monitor and update as needed

## 2011-09-13 LAB — CBC
HCT: 25.6 % — ABNORMAL LOW (ref 39.0–52.0)
Hemoglobin: 7.7 g/dL — ABNORMAL LOW (ref 13.0–17.0)
RBC: 3.18 MIL/uL — ABNORMAL LOW (ref 4.22–5.81)
WBC: 6.6 10*3/uL (ref 4.0–10.5)

## 2011-09-13 LAB — FUNGUS CULTURE W SMEAR: Fungal Smear: NONE SEEN

## 2011-09-13 LAB — ALBUMIN: Albumin: 2.1 g/dL — ABNORMAL LOW (ref 3.5–5.2)

## 2011-09-13 MED ORDER — PANTOPRAZOLE SODIUM 40 MG PO TBEC: 40.0000 mg | DELAYED_RELEASE_TABLET | Freq: Every day | ORAL | Status: DC

## 2011-09-13 MED ORDER — FLUCONAZOLE IN SODIUM CHLORIDE 400-0.9 MG/200ML-% IV SOLN: 400.0000 mg | INTRAVENOUS | Status: DC

## 2011-09-13 NOTE — Progress Notes (Signed)
Patient Name: [name] Date of Encounter: 09/13/2011     Active Problems:  Chronic systolic congestive heart failure  Normocytic anemia  Esophageal stricture  Thrombocytopenia  Septic embolism  Candidal endocarditis  Fever  Candidemia  Pleural effusion  Malnutrition  V-tach  Pulmonary embolism, septic  DVT (deep venous thrombosis)    SUBJECTIVE  Patient seems to be slowly improving, and pleased with course.  Plan is for home tomorrow.  No new VT on monitor.  I discussed case with Dr. Johney Frame who felt patient should have life vest given NSVT, and removal of ICD.    CURRENT MEDS    . aspirin EC  81 mg Oral Daily  . clonazePAM  0.5 mg Oral Daily  . clonazePAM  1 mg Oral QHS  . docusate sodium  100 mg Oral BID  . feeding supplement  237 mL Oral TID BM  . fluconazole (DIFLUCAN) IV  400 mg Intravenous Q24H  . furosemide  60 mg Oral Daily  . levalbuterol  0.63 mg Nebulization BID  . losartan  25 mg Oral Daily  . metoprolol  100 mg Oral BID  . pantoprazole  40 mg Oral Q1200  . rivaroxaban  15 mg Oral BID  . sodium chloride  3 mL Intravenous Q12H  . DISCONTD: pantoprazole (PROTONIX) IV  40 mg Intravenous Q12H    OBJECTIVE  Filed Vitals:   09/12/11 2103 09/13/11 0500 09/13/11 0948 09/13/11 1400  BP:  93/58 104/68 111/73  Pulse:  100 60 87  Temp:  99.1 F (37.3 C)  99.1 F (37.3 C)  TempSrc:  Oral  Oral  Resp:  20  20  Height:      Weight:  178 lb (80.74 kg)    SpO2: 97% 96%  100%    Intake/Output Summary (Last 24 hours) at 09/13/11 1849 Last data filed at 09/13/11 0028  Gross per 24 hour  Intake      0 ml  Output    200 ml  Net   -200 ml   Filed Weights   09/11/11 0458 09/12/11 0757 09/13/11 0500  Weight: 176 lb 5.9 oz (80 kg) 173 lb 15.1 oz (78.9 kg) 178 lb (80.74 kg)    PHYSICAL EXAM  General: Pleasant, NAD. Neuro: Alert and oriented X 3. Moves all extremities spontaneously. Psych: Normal affect. HEENT:  Normal  Neck: Supple without  bruits or JVD. Lungs:  Decrease BS in r base as before.   Heart: RRR S4 gallop Abdomen: Soft, non-tender, non-distended, BS + x 4.  Extremities: No clubbing, cyanosis or edema. DP/PT/Radials 2+ and equal bilaterally.  Accessory Clinical Findings  CBC  Basename 09/13/11 0525 09/12/11 0655  WBC 6.6 6.1  NEUTROABS -- --  HGB 7.7* 7.8*  HCT 25.6* 26.0*  MCV 80.5 82.0  PLT 336 368   Basic Metabolic Panel  Basename 09/12/11 0655  NA 134*  K 4.3  CL 95*  CO2 28  GLUCOSE 93  BUN 12  CREATININE 0.75  CALCIUM 9.6  MG --  PHOS --   Liver Function Tests  Basename 09/13/11 0525  AST --  ALT --  ALKPHOS --  BILITOT --  PROT --  ALBUMIN 2.1*   No results found for this basename: LIPASE:2,AMYLASE:2 in the last 72 hours Cardiac Enzymes No results found for this basename: CKTOTAL:3,CKMB:3,CKMBINDEX:3,TROPONINI:3 in the last 72 hours BNP No components found with this basename: POCBNP:3 D-Dimer No results found for this basename: DDIMER:2 in the last 72 hours Hemoglobin  A1C No results found for this basename: HGBA1C in the last 72 hours Fasting Lipid Panel No results found for this basename: CHOL,HDL,LDLCALC,TRIG,CHOLHDL,LDLDIRECT in the last 72 hours Thyroid Function Tests No results found for this basename: TSH,T4TOTAL,FREET3,T3FREE,THYROIDAB in the last 72 hours  TELE  No VT  ECG    Radiology/Studies  Dg Chest 1 View  08/28/2011  *RADIOLOGY REPORT*  Clinical Data: 67 year old male with pulmonary emboli.  Status post thoracentesis.  CHEST - 1 VIEW  Comparison: 08/27/2011 and earlier.  Findings: Stable right PICC line.  Stable cardiac size and mediastinal contours.  No pneumothorax.  Small residual right pleural effusion with superimposed patchy and confluent airspace disease at the right lung base.  Streaky opacity at the left base. Stable lung apices. Visualized tracheal air column is within normal limits.  IMPRESSION: No pneumothorax following thoracentesis.  Small  right pleural effusion and right greater than left airspace disease as the sequelae of pulmonary emboli.  Original Report Authenticated By: Harley Hallmark, M.D.   Dg Chest 2 View  09/10/2011  *RADIOLOGY REPORT*  Clinical Data: Shortness of breath.  Cough and congestion.  History of pulmonary embolism.  CHEST - 2 VIEW  Comparison: Chest x-ray 09/06/2011.  Chest CT 09/01/2011.  Findings: There is a right upper extremity PICC with tip terminating in the mid superior vena cava. The patient is status post median sternotomyfor CABG with a LIMA. Lung volumes are normal.  Mild elevation of the right hemidiaphragm has increased compared to the prior study.  There continues to be patchy areas of interstitial prominence and areas of airspace consolidation throughout the lungs bilaterally, most pronounced in the right middle and lower lobes.  Mild thickening of the horizontal fissure is again noted.  Small right-sided pleural effusion.  No definite left pleural effusion.  No evidence of pulmonary edema.  Heart size is borderline enlarged (unchanged). The patient is rotated to the right on today's exam, resulting in distortion of the mediastinal contours and reduced diagnostic sensitivity and specificity for mediastinal pathology.  Atherosclerosis in the thoracic aorta.  IMPRESSION: 1.  The appearance of chest is very similar to the recent prior study from 09/06/2011, but overall demonstrates slightly improved aeration.  Findings are suggestive of resolving areas of pulmonary hemorrhage related to resolving areas of pulmonary infarction. 2.  Small right-sided pleural effusion has also slightly decreased in size. 3.  Increased elevation of the right hemidiaphragm. 4.  Postoperative changes and support apparatus, as above. 5.  Atherosclerosis.  Original Report Authenticated By: Florencia Reasons, M.D.   Dg Chest 2 View  09/01/2011  *RADIOLOGY REPORT*  Clinical Data: Shortness of breath.  CHEST - 2 VIEW  Comparison:  08/31/2011  Findings: Confluent airspace opacity in the right lower lobe again noted, with patchy bilateral opacities diffusely.  Opacities have increased since prior study.  Possible small right effusion.  Right PICC line is unchanged with the tip in the right atrium.  Prior CABG. Heart is borderline in size.  IMPRESSION: Increasing patchy bilateral airspace opacities, most confluent in the right lower lobe.  Findings concerning for pneumonia.  Suspect small right effusion.  Original Report Authenticated By: Cyndie Chime, M.D.   Dg Chest 2 View  08/20/2011  *RADIOLOGY REPORT*  Clinical Data: Fever and cough.  Shortness of breath.  CHEST - 2 VIEW  Comparison: Chest x-ray 08/18/2011.  Findings: Multifocal interstitial and airspace opacities are again seen scattered throughout the lungs bilaterally, most pronounced throughout the right mid and lower lungs, as  well as the left lower lobe.  There is blunting of the right costophrenic sulcus, compatible with a small right-sided pleural effusion.  No definite left-sided pleural effusion is noted.  Mild pulmonary venous congestion.  Heart size is mildly enlarged (unchanged). The patient is rotated to the left on today's exam, resulting in distortion of the mediastinal contours and reduced diagnostic sensitivity and specificity for mediastinal pathology.  Atherosclerotic calcifications are noted within the arch of the aorta. New right upper extremity PICC with tip terminating in the right atrium. Status post median sternotomy for CABG with LIMA.  IMPRESSION: 1. New right upper extremity PICC with tip terminating in the right atrium. 2.  Allowing for slight differences in patient positioning and depth of inspiration, the radiographic appearance of chest is otherwise essentially unchanged, as above.  Original Report Authenticated By: Florencia Reasons, M.D.   Dg Chest 2 View  08/18/2011  *RADIOLOGY REPORT*  Clinical Data: Shortness of breath, congestion, possible  pneumonia, CHF  CHEST - 2 VIEW  Comparison: 08/17/2011; 08/16/2011; 08/15/2011  Findings: Grossly unchanged enlarged cardiac silhouette and mediastinal contours post median sternotomy.  Lung volumes remain persistently reduced.  Interval consolidation of peripheral heterogeneous / consolidative opacities within the right mid lung. No definite pleural effusion or pneumothorax.  Unchanged bones.  IMPRESSION: Worsening consolidative opacities within the right mid lung worrisome for progression of infection.  Continued attention on follow-up is recommended.  Original Report Authenticated By: Waynard Reeds, M.D.   Ct Chest W Contrast  09/01/2011  *RADIOLOGY REPORT*  Clinical Data: Follow-up progression of necrotic area.  CT CHEST WITH CONTRAST  Technique:  Multidetector CT imaging of the chest was performed following the standard protocol during bolus administration of intravenous contrast.  Contrast:   80 ml Omnipaque 300 IV.  Comparison: Chest CT 08/18/2011  Findings: Again noted is the large central right pulmonary embolus with extension into the right lower lobe and right middle lobe pulmonary arteries, unchanged.  Embolus and occlusion of left upper lobe pulmonary arterial branches also again noted.  Extensive airspace disease throughout the right lower lobe.  Moderate right pleural effusion, possibly partially loculated.  Airspace disease in the right lower lobe has improved slightly since prior study. Patchy areas of airspace disease in the left lung, particularly the left lower lobe are unchanged.  Increasing wedge-shaped ground- glass opacity in the left upper lobe anteriorly, possibly infarct.  Small scattered mediastinal lymph nodes, none pathologically enlarged.  No hilar or axillary adenopathy.  Visualized thyroid and chest wall soft tissues unremarkable. Imaging into the upper abdomen shows no acute findings.  IMPRESSION: Large central right pulmonary embolus again noted, unchanged. Slight improvement  in airspace disease within the right lower lobe. Right effusion, partially loculated, stable.  Left lung pulmonary emboli and ground-glass opacities again noted. New small wedge-shaped ground-glass opacity in the anterior left upper lobe, likely related to the emboli and pulmonary infarct.  Original Report Authenticated By: Cyndie Chime, M.D.   Ct Angio Chest W/cm &/or Wo Cm  08/27/2011  *RADIOLOGY REPORT*  Clinical Data: 66 year old male with recent diagnosis of acute pulmonary embolus.  Shortness of breath, rapid respiratory rate, chest pain.  CT ANGIOGRAPHY CHEST  Technique:  Multidetector CT imaging of the chest using the standard protocol during bolus administration of intravenous contrast. Multiplanar reconstructed images including MIPs were obtained and reviewed to evaluate the vascular anatomy.  Contrast: OMNIPAQUE IOHEXOL 350 MG/ML SOLN  Comparison: 08/18/2011 and earlier.  Findings: Good contrast bolus timing  in the pulmonary arterial tree.  The bilateral pulmonary emboli with bulky volume of right pulmonary artery thrombus again noted.  No significant interval change.  No saddle embolus.  Left straightening of the interventricular cardiac septum on today's images.  Increased layering right pleural effusion.  No pericardial or left pleural effusion.  Stable small mediastinal lymph nodes.  Right PICC line in place.  Stable visualized upper abdominal viscera.  Sequelae median sternotomy. Stable visualized osseous structures.  Right lower lobe airspace disease has not significantly changed. Right middle lobe occasional peribronchovascular nodularity is stable.  Decreased but not resolved left lower lobe superior segment airspace disease.  Left superior anterior chest wall hematoma containing some gas re- identified.  IMPRESSION: 1.  Bilateral pulmonary emboli with bulky right lower lobe thrombus has not significantly changed since 08/18/2011. 2.  Mildly increased right pleural effusion.  Stable  extensive right lower lobe airspace disease.  Stable or minimally improved ventilation elsewhere. 3. Small anterior chest wall hematoma with trace gas re-identified.  Original Report Authenticated By: Harley Hallmark, M.D.   Ct Angio Chest W/cm &/or Wo Cm  08/18/2011  *RADIOLOGY REPORT*  Clinical Data: Fevers, cough, and endocarditis.  CT ANGIOGRAPHY CHEST  Technique:  Multidetector CT imaging of the chest using the standard protocol during bolus administration of intravenous contrast. Multiplanar reconstructed images including MIPs were obtained and reviewed to evaluate the vascular anatomy.  Contrast: 80mL OMNIPAQUE IOHEXOL 350 MG/ML SOLN  Comparison: Chest x-ray dated 08/18/2011 and chest CT dated 07/18/2011  Findings: The patient has a massive right pulmonary embolus completely obstructing the pulmonary arteries to the right lower lobe.  There is still some flow to the right middle lobe and in and to the right upper lobe.  There are multiple smaller pulmonary emboli in the left lung primarily in the left upper lobe.  There is a moderate right pleural effusion.  There is an interstitial infiltrate in the right lower lobe which may be secondary to pulmonary infarction.  There is a small patchy infiltrate in the superior segment of the left lower lobe which could be infarction due to an embolus.  There is a new irregular air containing fluid pocket possible hematoma at the site of the previous pacemaker generator.  This may represent a postoperative hematoma with postoperative air and given the patient's clinical history this could represent an abscess.  Heart is at the upper limits of normal in size.  Evidence of prior CABG.  There are a few small reactive mediastinal lymph nodes, more prominent than on the prior study.  Visualized portion of the upper abdomen demonstrates a few small gallstones.  IMPRESSION:  1.  Massive right pulmonary embolus with probable infarction in the right lower lobe and possibly  within the lateral segment of the right middle lobe. 2.  Moderate right pleural effusion. 3.  Multiple smaller emboli in the left lung with a either a patchy pneumonia or possible pulmonary infarct in the superior segment of the left lower lobe. 4.  Cholelithiasis.  Critical Value/emergent results were called by telephone at the time of interpretation on 08/18/2011  at 7:00 p.m.  to  the patient's nurse, Efraim Kaufmann, who verbally acknowledged these results.  Original Report Authenticated By: Gwynn Burly, M.D.   Mr [facility] Wo Contrast  09/05/2011  *RADIOLOGY REPORT*  Clinical Data:  History of infected cardiac device removed 1 month ago.  Question of infarcts / septic emboli or mycotic aneurysm.  MRI HEAD WITH AND WITHOUT CONTRAST MRA  HEAD WITHOUT CONTRAST  Technique: Multiplanar, multiecho pulse sequences of the brain and surrounding structures were obtained according to standard protocol with and without intravenous contrast.  Angiographic images of the Circle of Willis were obtained using MRA technique without intravenous contrast.  Contrast: 17 ml MultiHance.  Comparisonnone.  MRI HEAD  Findings: Motion degraded exam.  No acute infarct.  No intracranial hemorrhage.  Small remote right corona radiata infarct.  Mild nonspecific white matter type changes may reflect result of small vessel disease.  No intracranial mass or abnormal enhancement.  Heterogeneous bone marrow may reflect result of anemia.  Partial opacification inferior aspect of the left mastoid air cells without obstructing lesions seen in the region of the posterior- superior nasopharynx contributing to left sided eustachian tube dysfunction.  Minimal to mild paranasal sinus mucosal thickening.  IMPRESSION: Motion degraded exam.  No acute infarct.  Remote small infarct posterior right corona radiata with scattered nonspecific white matter type changes which may reflect result of small vessel disease.  Mild global atrophy without hydrocephalus.  No  abnormal intracranial enhancing lesion/mass.  Heterogeneous bone marrow may reflect result of underlying anemia.  Left mastoid air cell partial opacification and minimal to mild paranasal sinus mucosal thickening  MRA HEAD  Findings: Small bulge superior margin proximal A1 segment left anterior cerebral artery.  This is not a typical position for a saccular aneurysm or mycotic aneurysm.  Small vessel may arise from this structure.  Stability can be confirmed on follow-up.  Middle cerebral artery and A2 segment anterior cerebral artery branch vessel irregularity without discrete aneurysm.  Anterior circulation without medium or large size vessel significant stenosis or occlusion.  Full extent of the left PICA not included on present examination. Nonvisualization right PICA with large right AICA. Nonvisualization left AICA.  Mild narrowing distal right vertebral artery.  No high-grade stenosis of the basilar artery.  Superior cerebellar artery and posterior cerebral artery mild branch vessel irregularity without discrete aneurysm.  IMPRESSION: Small bulge superior margin proximal A1 segment left anterior cerebral artery.  This is not a typical position for a saccular aneurysm or mycotic aneurysm.  Small vessel may arise from this structure.  Stability can be confirmed on follow-up.  Please see above discussion.  Original Report Authenticated By: Fuller Canada, M.D.   Mr [facility] Wo Contrast  09/05/2011  *RADIOLOGY REPORT*  Clinical Data:  History of infected cardiac device removed 1 month ago.  Question of infarcts / septic emboli or mycotic aneurysm.  MRI HEAD WITH AND WITHOUT CONTRAST MRA HEAD WITHOUT CONTRAST  Technique: Multiplanar, multiecho pulse sequences of the brain and surrounding structures were obtained according to standard protocol with and without intravenous contrast.  Angiographic images of the Circle of Willis were obtained using MRA technique without intravenous contrast.  Contrast: 17 ml  MultiHance.  Comparisonnone.  MRI HEAD  Findings: Motion degraded exam.  No acute infarct.  No intracranial hemorrhage.  Small remote right corona radiata infarct.  Mild nonspecific white matter type changes may reflect result of small vessel disease.  No intracranial mass or abnormal enhancement.  Heterogeneous bone marrow may reflect result of anemia.  Partial opacification inferior aspect of the left mastoid air cells without obstructing lesions seen in the region of the posterior- superior nasopharynx contributing to left sided eustachian tube dysfunction.  Minimal to mild paranasal sinus mucosal thickening.  IMPRESSION: Motion degraded exam.  No acute infarct.  Remote small infarct posterior right corona radiata with scattered nonspecific white matter type changes  which may reflect result of small vessel disease.  Mild global atrophy without hydrocephalus.  No abnormal intracranial enhancing lesion/mass.  Heterogeneous bone marrow may reflect result of underlying anemia.  Left mastoid air cell partial opacification and minimal to mild paranasal sinus mucosal thickening  MRA HEAD  Findings: Small bulge superior margin proximal A1 segment left anterior cerebral artery.  This is not a typical position for a saccular aneurysm or mycotic aneurysm.  Small vessel may arise from this structure.  Stability can be confirmed on follow-up.  Middle cerebral artery and A2 segment anterior cerebral artery branch vessel irregularity without discrete aneurysm.  Anterior circulation without medium or large size vessel significant stenosis or occlusion.  Full extent of the left PICA not included on present examination. Nonvisualization right PICA with large right AICA. Nonvisualization left AICA.  Mild narrowing distal right vertebral artery.  No high-grade stenosis of the basilar artery.  Superior cerebellar artery and posterior cerebral artery mild branch vessel irregularity without discrete aneurysm.  IMPRESSION: Small bulge  superior margin proximal A1 segment left anterior cerebral artery.  This is not a typical position for a saccular aneurysm or mycotic aneurysm.  Small vessel may arise from this structure.  Stability can be confirmed on follow-up.  Please see above discussion.  Original Report Authenticated By: Fuller Canada, M.D.   Dg Chest Bilateral Decubitus  09/01/2011  *RADIOLOGY REPORT*  Clinical Data: Shortness of breath.  Pleural effusions, status post thoracentesis.  CHEST - BILATERAL DECUBITUS VIEW  Comparison: Chest x-ray earlier today and 08/31/2011  Findings: There is a small to moderate layering right pleural effusion noted on the decubitus view.  IMPRESSION: Small to moderate layering right effusion.  Original Report Authenticated By: Cyndie Chime, M.D.   Dg Chest Port 1 View  09/06/2011  *RADIOLOGY REPORT*  Clinical Data: Evaluate PICC line placement  PORTABLE CHEST - 1 VIEW  Comparison: 09/01/2011  Findings:  Right arm PICC line tip is in the SVC.  There is mild cardiac enlargement and mild interstitial edema. Partially loculated right pleural effusion is again noted.  Airspace opacities noted within the left midlung, similar to previous exam.  IMPRESSION:  1.  No change and pulmonary edema and pleural effusion. 2.  Persistent left midlung airspace opacity.  Original Report Authenticated By: Rosealee Albee, M.D.   Dg Chest Port 1 View  08/31/2011  *RADIOLOGY REPORT*  Clinical Data: Follow-up.  CHF.  PORTABLE CHEST - 1 VIEW  Comparison: 08/29/2011  Findings: Continued opacity in the right lower lung, likely pneumonia.  Heart is mildly enlarged.  Prior CABG.  No confluent opacity on the left.  Right PICC line remains in place with the tip in the right atrium.  Possible small right effusion.  IMPRESSION: Continued right lower lobe consolidation concerning for pneumonia. Possible small right effusion.  Original Report Authenticated By: Cyndie Chime, M.D.   Dg Chest Port 1 View  08/29/2011  *RADIOLOGY  REPORT*  Clinical Data: Cough, shortness of breath.  PORTABLE CHEST - 1 VIEW  Comparison: 08/28/2011  Findings: Right PICC line remains in place with the tip in the right atrium, unchanged.  Prior CABG.  Consolidation in the right lower lobe, unchanged.  No focal opacity on the left.  Heart is mildly enlarged.  IMPRESSION: Continued right lower lobe consolidation.  Improving opacities in the left lung.  Mild cardiomegaly.  Original Report Authenticated By: Cyndie Chime, M.D.   Dg Chest Port 1 View  08/17/2011  *RADIOLOGY REPORT*  Clinical Data: Respiratory failure  PORTABLE CHEST - 1 VIEW  Comparison: 08/16/2011; 08/15/2011; 08/08/2011  Findings:  Grossly unchanged enlarged cardiac silhouette and 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  Interval extubation and removal of enteric tube.  The lungs remain hyperinflated.  Ill- defined heterogeneous opacities within the right mid lung, grossly unchanged.  No definite pneumothorax or pleural effusion. Unchanged bones.  IMPRESSION: 1.  Interval extubation and removal of enteric tube.  No pneumothorax. 2.  Grossly unchanged right mid lung heterogeneous opacities, possibly atelectasis, though developing infection is not excluded. Continued attention on follow-up is recommended.  Original Report Authenticated By: Waynard Reeds, M.D.   Portable Chest Xray In Am  08/16/2011  *RADIOLOGY REPORT*  Clinical Data: Evaluate lung fields and endotracheal tube position  PORTABLE CHEST - 1 VIEW  Comparison: Chest radiograph 08/15/2011  Findings: Endotracheal tube and NG tube unchanged.  Sternal wires overlie stable cardiac silhouette.  There is an increase in central venous congestion compared to prior.  Mild basilar atelectasis.  No pneumothorax.  IMPRESSION:  1.  Stable support apparatus. 2.  Increase in central venous pulmonary congestion.  Original Report Authenticated By: Genevive Bi, M.D.   Dg Chest Port 1 View  08/15/2011  *RADIOLOGY REPORT*   Clinical Data: Shortness of breath.  Cough.  Fever.  PORTABLE CHEST - 1 VIEW  Comparison: 08/08/2011  Findings: Stable appearance of postoperative changes in the mediastinum.  The cardiac pacemaker has been removed since the previous study.  Cardiac enlargement with increased pulmonary vascularity suggesting vascular congestion.  No significant edema or consolidation.  No blunting of costophrenic angles.  No pneumothorax.  IMPRESSION: Interval removal of cardiac pacemaker.  Cardiac enlargement with pulmonary vascular congestion.  No edema or consolidation.  Original Report Authenticated By: Marlon Pel, M.D.   Portable Chest Xray  08/15/2011  *RADIOLOGY REPORT*  Clinical Data: Endotracheal tube placement.  PORTABLE CHEST - 1 VIEW  Comparison: 08/15/2011  Findings: Endotracheal tube is 3.7 cm above the carina. Nasogastric tube enters the stomach.  Prior CABG noted.  Mild cardiomegaly is present.  Mild vascular indistinctness noted without overt edema.  IMPRESSION:  1.  Endotracheal tube is satisfactorily positioned with tip 3.7 cm above the carina. 2.  Mild cardiomegaly, with borderline appearance for pulmonary venous hypertension.  No overt edema.  Original Report Authenticated By: Dellia Cloud, M.D.   Dg Bone Survey Met  08/19/2011  *RADIOLOGY REPORT*  Clinical Data: M spike on SPEP and immune suppression, evaluate for lytic lesions of multiple myeloma  METASTATIC BONE SURVEY  Comparison: Chest CT - 08/18/2011; CT abdomen pelvis - 07/18/2011; right hip CT - 08/10/2011  Findings:  There is a possible 1.2 x 2.2 cm lucency within the vertex of the calvarium.  Otherwise, no discrete lytic lesions are identified.  Degenerative change within the lumbar spine, worse at the L1 - L2 and L4 - L5. Multilevel cervical spine degenerative change.  Post mediastinotomy and CABG.  Surgical clips overlying the medial aspect of the right upper thigh. Vascular calcifications.  IMPRESSION:  Possible 2.2 cm lucency  within the vertex of the calvarium. Further evaluation with head CT of brain MRI may be performed as clinically indicated. Otherwise, no additional lucent lesions identified.  Original Report Authenticated By: Waynard Reeds, M.D.   Dg C-arm 1-60 Min-no Report  08/15/2011  CLINICAL DATA: ICD extraction   C-ARM 1-60 MINUTES  Fluoroscopy was utilized by the requesting physician.  No radiographic  interpretation.  US Thoracentesis Asp Pleural Space W/img Guide  08/28/2011  *RADIOLOGY REPORT*  Clinical Data:  Patient with pleuritic chest pain, recent finding of pulmonary embolus, right-sided pleural effusion  ULTRASOUND GUIDED right THORACENTESIS  Comparison:  None  An ultrasound guided thoracentesis was thoroughly discussed with the patient and questions answered.  The benefits, risks, alternatives and complications were also discussed.  The patient understands and wishes to proceed with the procedure.  Written consent was obtained.  Ultrasound was performed to localize and mark an adequate pocket of fluid in the right chest.  The area was then prepped and draped in the normal sterile fashion.  1% Lidocaine was used for local anesthesia.  Under ultrasound guidance a 19 gauge Yueh catheter was introduced.  Thoracentesis was performed.  The catheter was removed and a dressing applied.  Complications:  The patient had mild vasovagal response post procedure with a slight drop in his blood pressure to 98/72. He recovered well after and had no loss of consciousness. BP was then back to baseline at 119/72  Findings: A total of approximately 240 ml of blood tinged serous fluid was removed. A fluid sample was sent for laboratory analysis.  IMPRESSION: Successful ultrasound guided right thoracentesis yielding 240 ml of pleural fluid.  Read by Brayton El PA-C  Original Report Authenticated By: Reola Calkins, M.D.    ASSESSMENT AND PLAN  1.  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   Ischemic CM with LV dysfunction 3.  Severe anemia 4.  DVT  Discussed with Dr. Johney Frame.  Will need lifevest and early follow up with Dr .Ladona Ridgel  Signed, Shawnie Pons MD, [facility], [facility]

## 2011-09-13 NOTE — Progress Notes (Signed)
Dr. Johney Frame called to see patient pre-discharge regarding need for LifeVest. 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 He is being discharged in the morning with Home Health and IV antibiotics (IV fluconazole x 6 weeks per ID) with BiV ICD re-implantation scheduled once his infection resolves. Mr. Capobianco meets criteria for LifeVest.

## 2011-09-13 NOTE — Progress Notes (Signed)
Medical Student Daily Progress Note  Subjective: Patient is doing well this morning. He got a good night rest and transferred from 2900 yesterday to 3700, a unit he very much enjoys. He has noticed an improvement in his breathing noticeably compared to 1 week ago as well as his strength and cough. He seems to think his cough is almost gone, and he does continue to use his spirometer as much as possible. He has been hopeful for discharge soon and his sprits have remained high throughout his hospital stay. He is resting in bed comfortably on exam.  Objective: Vital signs in last 24 hours: Filed Vitals:   09/12/11 1600 09/12/11 2100 09/12/11 2103 09/13/11 0500  BP: 103/66 108/68  93/58  Pulse: 95 99  100  Temp: 99.9 F (37.7 C) 98.7 F (37.1 C)  99.1 F (37.3 C)  TempSrc: Oral Oral  Oral  Resp: 20 19  20   Height:      Weight:    80.74 kg (178 lb)  SpO2: 97% 99% 97% 96%   Weight change:   Intake/Output Summary (Last 24 hours) at 09/13/11 0746 Last data filed at 09/13/11 0028  Gross per 24 hour  Intake   1451 ml  Output   1100 ml  Net    351 ml   Physical Exam: BP 93/58  Pulse 100  Temp 99.1 F (37.3 C) (Oral)  Resp 20  Ht 5\' 10"  (1.778 m)  Wt 80.74 kg (178 lb)  BMI 25.54 kg/m2  SpO2 96% General appearance: alert, cooperative and no distress Head: Normocephalic, without obvious abnormality, atraumatic Neck: no adenopathy, no carotid bruit, no JVD, supple, symmetrical, trachea midline and thyroid not enlarged, symmetric, no tenderness/mass/nodules Back: symmetric, no curvature. ROM normal. No CVA tenderness. Lungs: diminished breath sounds RLL and rales RML and "dry" Chest wall: no tenderness Heart: regular rate and rhythm, S1, S2 normal, no murmur, click, rub or gallop Abdomen: soft, non-tender; bowel sounds normal; no masses,  no organomegaly Extremities: extremities normal, atraumatic, no cyanosis or edema Skin: Skin color, texture, turgor normal. No rashes or lesions.  Old pacer pocket left upper chest (no rubor, dolor, calor, tumor)  Lab Results: Basic Metabolic Panel:  Lab 09/12/11 4098 09/10/11 0530  NA 134* 134*  K 4.3 4.6  CL 95* 95*  CO2 28 31  GLUCOSE 93 113*  BUN 12 13  CREATININE 0.75 0.80  CALCIUM 9.6 9.5  MG -- 2.3  PHOS -- --   Liver Function Tests:  Lab 09/13/11 0525 09/10/11 0530  AST -- 24  ALT -- 25  ALKPHOS -- 93  BILITOT -- 0.1*  PROT -- 7.6  ALBUMIN 2.1* 2.0*   No results found for this basename: LIPASE:2,AMYLASE:2 in the last 168 hours No results found for this basename: AMMONIA:2 in the last 168 hours CBC:  Lab 09/13/11 0525 09/12/11 0655  WBC 6.6 6.1  NEUTROABS -- --  HGB 7.7* 7.8*  HCT 25.6* 26.0*  MCV 80.5 82.0  PLT 336 368   Cardiac Enzymes: No results found for this basename: CKTOTAL:3,CKMB:3,CKMBINDEX:3,TROPONINI:3 in the last 168 hours BNP: No results found for this basename: PROBNP:3 in the last 168 hours D-Dimer: No results found for this basename: DDIMER:2 in the last 168 hours CBG: No results found for this basename: GLUCAP:6 in the last 168 hours Hemoglobin A1C: No results found for this basename: HGBA1C in the last 168 hours Fasting Lipid Panel: No results found for this basename: CHOL,HDL,LDLCALC,TRIG,CHOLHDL,LDLDIRECT in the last 119 hours Thyroid  Function Tests: No results found for this basename: TSH,T4TOTAL,FREET4,T3FREE,THYROIDAB in the last 168 hours Coagulation:  Lab 09/12/11 0655 09/11/11 0500 09/10/11 0530  LABPROT 17.7* 15.8* 15.5*  INR 1.43 1.23 1.20   Anemia Panel:  Lab 09/10/11 0530  VITAMINB12 --  FOLATE --  FERRITIN 1143*  TIBC --  IRON --  RETICCTPCT --   Urine Drug Screen: Drugs of Abuse  No results found for this basename: labopia, cocainscrnur, labbenz, amphetmu, thcu, labbarb    Alcohol Level: No results found for this basename: ETH:2 in the last 168 hours Urinalysis: No results found for this basename:  COLORURINE:2,APPERANCEUR:2,LABSPEC:2,PHURINE:2,GLUCOSEU:2,HGBUR:2,BILIRUBINUR:2,KETONESUR:2,PROTEINUR:2,UROBILINOGEN:2,NITRITE:2,LEUKOCYTESUR:2 in the last 168 hours  Micro Results: Recent Results (from the past 240 hour(s))  MRSA PCR SCREENING     Status: Normal   Collection Time   09/04/11  8:30 PM      Component Value Range Status Comment   MRSA by PCR NEGATIVE  NEGATIVE Final   TECHNOLOGIST SMEAR REVIEW     Status: Normal   Collection Time   09/07/11  5:10 AM      Component Value Range Status Comment   Path Review     Final    Value: PLATELET CLUMPS NOTED ON SMEAR, COUNT APPEARS ADEQUATE   Studies/Results: No results found. Medications:  I have reviewed the patient's current medications. Prior to Admission:  Prescriptions prior to admission  Medication Sig Dispense Refill  . acetaminophen (TYLENOL) 325 MG tablet Take 325-650 mg by mouth every 4 (four) hours as needed. For pain/fever       . Ascorbic Acid (VITAMIN C) 100 MG tablet Take 100 mg by mouth daily.      [name] aspirin EC 81 MG EC tablet Take 1 tablet (81 mg total) by mouth daily.      . calcium carbonate (TUMS - DOSED IN MG ELEMENTAL CALCIUM) 500 MG chewable tablet Chew 1 tablet by mouth 3 (three) times daily.      . clonazePAM (KLONOPIN) 0.5 MG tablet Take 1 tablet (0.5 mg total) by mouth 2 (two) times daily as needed for anxiety.  30 tablet  1  . esomeprazole (NEXIUM) 40 MG capsule Take 40 mg by mouth 2 (two) times daily.       . famotidine (PEPCID) 10 MG tablet Take 10 mg by mouth 2 (two) times daily.      . feeding supplement (ENSURE COMPLETE) LIQD Take 237 mLs by mouth 2 (two) times daily between meals.      . Flaxseed, Linseed, 1000 MG CAPS Take 1 capsule (1,000 mg total) by mouth daily.      . furosemide (LASIX) 40 MG tablet Take 1 tablet (40 mg total) by mouth daily.  30 tablet  3  . metoprolol (LOPRESSOR) 50 MG tablet Take 0.5 tablets (25 mg total) by mouth 2 (two) times daily.  30 tablet  1  . Multiple  Vitamins-Minerals (MULTIVITAMIN) tablet Take 1 tablet by mouth daily with breakfast.   30 tablet    . oxyCODONE-acetaminophen (PERCOCET) 5-325 MG per tablet Take 1 tablet by mouth every 6 (six) hours as needed for pain. Do not take more than 4000 mg of acetaminophen in 24 hours. Both Tylenol and Percocet contain acetaminophen.  32 tablet  0  . sodium chloride 0.9 % SOLN 100 mL with micafungin 50 MG SOLR 150 mg Inject 150 mg into the vein daily. Dispense quantity sufficient to treat until on 09/26/2011. Patient is to receive Micafungin via PICC line daily. Via home health agency. PROTECT FROM  LIGHT - DO NOT SHAKE  34 application  0  . DISCONTD: acetaminophen (TYLENOL) 325 MG tablet Take 1-2 tablets (325-650 mg total) by mouth every 4 (four) hours as needed.      . polyethylene glycol (MIRALAX / GLYCOLAX) packet Take 17 g by mouth daily as needed.  14 each  11   Anti-infectives     Start     Dose/Rate Route Frequency Ordered Stop   09/09/11 1400   ceFAZolin (ANCEF) IVPB 2 g/50 mL premix  Status:  Discontinued        2 g 100 mL/hr over 30 Minutes Intravenous 3 times per day 09/09/11 1059 09/09/11 1335   09/07/11 1400   ceFEPIme (MAXIPIME) 1 g in dextrose 5 % 50 mL IVPB  Status:  Discontinued        1 g 100 mL/hr over 30 Minutes Intravenous 3 times per day 09/07/11 1327 09/09/11 1058   09/04/11 2000   ciprofloxacin (CIPRO) tablet 500 mg  Status:  Discontinued        500 mg Oral 2 times daily 09/04/11 1410 09/07/11 1306   09/02/11 1400   fluconazole (DIFLUCAN) IVPB 400 mg        400 mg 200 mL/hr over 60 Minutes Intravenous Every 24 hours 09/02/11 1300     09/02/11 1200   vancomycin (VANCOCIN) 1,250 mg in sodium chloride 0.9 % 250 mL IVPB  Status:  Discontinued        1,250 mg 166.7 mL/hr over 90 Minutes Intravenous Every 12 hours 09/02/11 1001 09/03/11 0947   08/31/11 1600   vancomycin (VANCOCIN) IVPB 1000 mg/200 mL premix  Status:  Discontinued        1,000 mg 200 mL/hr over 60 Minutes  Intravenous Every 8 hours 08/31/11 1527 09/02/11 1001   08/31/11 1600   piperacillin-tazobactam (ZOSYN) IVPB 3.375 g  Status:  Discontinued        3.375 g 12.5 mL/hr over 240 Minutes Intravenous 3 times per day 08/31/11 1527 09/04/11 1410   08/27/11 2200   micafungin (MYCAMINE) 150 mg in sodium chloride 0.9 % 100 mL IVPB  Status:  Discontinued        150 mg 100 mL/hr over 1 Hours Intravenous Every 24 hours 08/27/11 2031 09/02/11 1300         Scheduled Meds:   . aspirin EC  81 mg Oral Daily  . clonazePAM  0.5 mg Oral Daily  . clonazePAM  1 mg Oral QHS  . docusate sodium  100 mg Oral BID  . feeding supplement  237 mL Oral TID BM  . fluconazole (DIFLUCAN) IV  400 mg Intravenous Q24H  . furosemide  60 mg Oral Daily  . levalbuterol  0.63 mg Nebulization BID  . losartan  25 mg Oral Daily  . metoprolol  100 mg Oral BID  . pantoprazole (PROTONIX) IV  40 mg Intravenous Q12H  . rivaroxaban  15 mg Oral BID  . sodium chloride  3 mL Intravenous Q12H  . DISCONTD: Warfarin - Pharmacist Dosing Inpatient   Does not apply q1800   Continuous Infusions:   . sodium chloride 20 mL/hr (09/13/11 0529)  . heparin Stopped (09/12/11 1741)  . DISCONTD: heparin 2,700 Units/hr (09/12/11 0040)   PRN Meds:.acetaminophen, chlorpheniramine-HYDROcodone, ipratropium, levalbuterol, oxyCODONE-acetaminophen, polyethylene glycol, sodium chloride, DISCONTD: chlorpheniramine-HYDROcodone Assessment/Plan:  Normocytic anemia - Stable. Hgb 7.7 today. MCV 80.5. Ferritin = 1143. Hgb has been trending down since admission but he seems to have stabilized in the 7.3-7.6 range. FOBT  test positive x1 after BM (patient was constipated for 4 days). 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 Iron = 21, Sat ratio = 9, TIBC =243, Ferritin = 597, Retic count =1.9, FOBT negative. Smear revealed mild anemia with normal morphology. Side effect of micafungin can be suppression of RBC production (3-10%).  - Appreciate GI consult  in the management of this patient  - Per GI: conservative management. Monitor Hgb then endoscopy  - Will monitor CBC. Transfuse if Hgb <7 and possible Endoscopy   Distal Femoral Vein thrombosis - Patient had LE doppler on 09/03/11 that showed DVT in right femoral vein distally. MRI/MRA head on 09/04/11: see read, but notable small bulge proximal A1 segment of left ACA (not a typical place for mycotic aneurysm however)  - Appreciate pharmacy consult in the management of anticoagulation in this patient  - d/c heparin and coumadin  - start xarelto at 5 pm 09/12/11 (15mg  BID x 21 days, then 20 mg daily)  - Will monitor closely for any signs of bleeding. If stable no IVC filer will be placed.   Pleural effusion / pulmonary embolism / pulmonary infarction - Stable. CXR 09/10/11 shows normal lung volumes, mild elevation of R hemidiaphragm, overall improved aeration (e.g. Resolving pulmonary hemorrhage 2/2 infarction). Patient is s/p thoracentesis (08/28/11) for R effusion. Most recent CT chest (09/01/11) showed central right PA Candidal embolus (extension to RML, RLL), and embolus/occlusion of LUL pulmonary artery branches. Moderate right pleural effusion present (possibly loculated). Wedge shaped infarcts noted in RML and LUL.  Pleural fluid culture: Negative, final / Pleural fluid Fungus culture: in progress x 4 weeks (collected 08/28/11): NTD / Pleural fluid AFB culture w/ smear: in progress x 6 weeks (collected 08/28/11): NTD  - continue tussionex (chlorpheniramine/hydrocodone) 10-8mg /47mL [id number]  - Continue conservative management with pain medication and inhalers  - Continue incentive spirometer to help with expulsion / cough   Bacteriuria - Patient's urine culture revealed Klebsiella pneumoniae. 85,000 col/mL. Urine culture pansensitive except for Amoxicillin. D/c zosyn (08/31/11>>09/04/11). Started Cipro on 6/5>>09/07/11. Then changed to cefepime 09/07/11>>09/09/11 for lower risk of C. Diff for a total of 10 day course.   - abx course completed   Acute on chronic systolic CHF 2/2 CAD and ischemic cardiomyopathy - Improved. 2D echo (08/23/11) shows EF = 30% with diffuse hypokinesis, mild LVH, and moderately dilated LA. 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  - Appreciate cardiology consult in the management of this patient  - continue ASA 81 mg daily  - continue metoprolol 100 mg BID  - continue losartan 25 mg daily  - Continue lasix 60 daily   NSVT - Patient has had recent episodes of NSVT. Most recent significant episode of 47 beat run over 10 seconds 09/04/11 (none since). Currently on Metoprolol tartrate 100 mg BID  - Continue current regimen  - Goal of K>3.9 and Mg>1.9   Tachycardia - Improving slowly. Patient continues to have some episodes of tachycardia (90's -110's). Multifactorial. Currently on metoprolol 100 BID.  - Continue current regimen  - Will continue to monitor   Fever - resolved; Patient had Candidal embolus (from atrial lead on former ICD) to R pulmonary artery on 08/15/11.  blood cultures x 2 : Negative, final. Change micafungin (08/15/11>>09/01/11) to IV fluconazole 400 mg daily to finish 6 week course (08/15/11>>09/26/11) since cultures sensitive to micafungin and fluconazole  - Will continue to monitor  - Treatment completed 09/09/11 for bacteriuria  -  Old pacer pocket (no warmth, erythema, tenderness, disproportionate swelling). Will continue to monitor  - Appreciate ID recommendations and consult in the management of this patient   Hyponatremia - Resolved. Likely 2/2 his CHF and removal of his biV ICD.  - will continue to monitor   GERD / Esophageal Candidiasis - Stable. He does note some minor discomfort with swallowing, however he also states that the dilation on 08/13/11 was not a complete dilation that he is used to. Of note, plasma concentration of fluconazole is the same for both IV and PO routes. Also, bioavailability is unaltered by food or  gastric acidity. Lastly, fluconazole in a potent inhibitor of the CYP450 system, which would also increase efficacy of PPI's, but this will not affect the absorption of fluconazole.  - Appreciate Gastroenterology consult in the management of this patient  - continue fluconazole  - change protonix to 40 mg PO daily - Appreciate Pharm D candidates help with looking into interaction of fluconazole with PPI's.   Anxiety / Insomnia - He has had some anxiety at night when trying to sleep, especially with his cough.  - continue clonazepam 1 mg QHS and 0.5 mg daily  - continue Tussionex BIDPRN   Insomnia - Stable as long as anxiety is controlled.  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 On SPEP his IgM and IgA were increased during his last admission, which could likely be due to his acute illnesses. Hematology consult during previous admission reccommended follow-up after his current infection is completely resolved.  - repeat SPEP/UPEP after full resolution of Candidal infection   Disposition - Patient seems to be stable and doing well. Getting him ready for discharge in the am.   LOS: 17 days   This is a Psychologist, occupational Note.  The care of the patient was discussed with Dr. Margorie John and the assessment and plan formulated with their assistance.  Please see their attached note for official documentation of the daily encounter.  Lewie Chamber 09/13/2011, 7:46 AM   Resident Addendum to Medical Student Note   I have seen and examined the patient, and agree with the the medical student assessment and plan outlined above. Please see my brief note below for additional details.  HPI: Mr. Jill Side is feeling very well today. He is happy to be close to the end of his hospital stay. His breathing and cough are improved. Pleuritic chest pain is essentially resolved.  OBJECTIVE: VS: Reviewed  Meds: Reviewed  Labs: Reviewed  Imaging: Reviewed    Physical Exam: General: Vital signs reviewed and noted. Well-developed, well-nourished, in no acute distress; alert, appropriate and cooperative throughout examination.  Lungs:  Normal respiratory effort. Right posterior lung field diminished.  Heart: RRR. S1 and S2 normal without gallop, murmur, or rubs.  Abdomen:  BS normoactive. Soft, Nondistended, non-tender.   Extremities: No pretibial edema.     ASSESSMENT/ PLAN:  -Will plan to send patient home tomorrow after giving him IV fluconazole in the morning.  -Cardiology has arranged for life vest to tide him over until he is able to have a new Bi-V pacer placed. -Follow up has been arranged with PCP, infectious disease and cardiology -patient will be on xarelto for 3 months for DVT, with reassessment at that time -he was stable today on telemetry out of step down unit  Length of Stay: 17   Margorie John, M.D. Salome Arnt, Internal Medicine Resident 09/13/2011, 5:50 PM

## 2011-09-13 NOTE — Progress Notes (Addendum)
Internal Medicine Attending  Date: 09/13/2011  Patient name: [name] Medical record number: [id number] Date of birth: [birth year] Age: 66 y.o. Gender: male  I saw and evaluated the patient on a.m. rounds with house staff. I reviewed the resident's note by Dr. Maisie Fus and I agree with the resident's findings and plans as documented in her note.

## 2011-09-14 ENCOUNTER — Encounter (HOSPITAL_COMMUNITY): Payer: Self-pay | Admitting: Ophthalmology

## 2011-09-14 LAB — CBC
HCT: 26.4 % — ABNORMAL LOW (ref 39.0–52.0)
MCH: 24.4 pg — ABNORMAL LOW (ref 26.0–34.0)
MCV: 80.5 fL (ref 78.0–100.0)
Platelets: 346 10*3/uL (ref 150–400)
RBC: 3.28 MIL/uL — ABNORMAL LOW (ref 4.22–5.81)
WBC: 5.5 10*3/uL (ref 4.0–10.5)

## 2011-09-14 MED ORDER — RIVAROXABAN 15 MG PO TABS: ORAL_TABLET | ORAL | Status: DC

## 2011-09-14 MED ORDER — LOSARTAN POTASSIUM 25 MG PO TABS: 25.0000 mg | ORAL_TABLET | Freq: Every day | ORAL | Status: DC

## 2011-09-14 MED ORDER — HEPARIN SOD (PORK) LOCK FLUSH 100 UNIT/ML IV SOLN
250.0000 [IU] | INTRAVENOUS | Status: AC | PRN
  Administered 2011-09-14: 250 [IU]

## 2011-09-14 MED ORDER — FUROSEMIDE 40 MG PO TABS: 60.0000 mg | ORAL_TABLET | Freq: Every day | ORAL | Status: DC

## 2011-09-14 MED ORDER — METOPROLOL TARTRATE 100 MG PO TABS: 100.0000 mg | ORAL_TABLET | Freq: Two times a day (BID) | ORAL | Status: DC

## 2011-09-14 NOTE — Progress Notes (Signed)
Subjective:  No c/o SOB or chest pain.  Feels reasonably well.  Anxious to go home.  Objective:  Vital Signs in the last 24 hours: BP 129/82  Pulse 89  Temp 98 F (36.7 C) (Oral)  Resp 19  Ht 5\' 10"  (1.778 m)  Wt 78.472 kg (173 lb)  BMI 24.82 kg/m2  SpO2 95%  Physical Exam: Elderly Wm, pale in NAD Lungs:  Clear Cardiac:  Regular rhythm, normal S1 and S2,1-2/6 systolic murmur Abdomen:  Soft, nontender, no masses Extremities:  No edema present   Weight Filed Weights   09/12/11 0757 09/13/11 0500 09/14/11 0500  Weight: 78.9 kg (173 lb 15.1 oz) 80.74 kg (178 lb) 78.472 kg (173 lb)    Lab Results: Basic Metabolic Panel:  Basename 09/12/11 0655  NA 134*  K 4.3  CL 95*  CO2 28  GLUCOSE 93  BUN 12  CREATININE 0.75   CBC:  Basename 09/14/11 0521 09/13/11 0525  WBC 5.5 6.6  NEUTROABS -- --  HGB 8.0* 7.7*  HCT 26.4* 25.6*  MCV 80.5 80.5  PLT 346 336   Telemetry: No VT noted sinus rhythm  Assessment/Plan:  1. Candida endocarditis s/p ICD removal currently being fitted for Life vest 2 Cardiomyopathy 3. Pulmonary embolism on Xarelto 4. History of V tach  Rec:  OK to go home on Xarelto per PE regimen.  Early f/u with Dr. Ladona Ridgel.   Darden Palmer  MD [facility] Cardiology  09/14/2011, 12:19 PM

## 2011-09-14 NOTE — Discharge Instructions (Signed)
You were hospitalized for your continued Candida infection. Your micafungin was continued on admission until the sensitivities were received from the reference lab. Once the report was received your Candidal infection was sensitive to fluconazole and you were appropriately changed to fluconazole IV 400 mg daily. Also during your hospital stay, a DVT was discovered in your right leg and treatment was started. You were given heparin and Coumadin and then decision to switch to Xarelto was made. Your progress in the hospital was closely monitored as your body continued to fight the infection.  If you have any worsening symptoms of Shortness of Breath, Chest Pain, or spike a fever that does not respond to Tylenol, please call the clinic immediately. [phone]  #1) Candidemia - Your Candida treatment will continue on discharge with Fluconazole intravenous (through your PICC line) 400 mg daily.  #2) Deep Vein Thrombosis - A clot was discovered in your right leg that you will take Xarelto for treatment.     Candida Infection, Adult A candida infection (also called yeast, fungus and Monilia infection) is an overgrowth of yeast that can occur anywhere on the body. A yeast infection commonly occurs in warm, moist body areas. Usually, the infection remains localized but can spread to become a systemic infection. A yeast infection may be a sign of a more severe disease such as diabetes, leukemia, or AIDS. A yeast infection can occur in both men and women. In women, Candida vaginitis is a vaginal infection. It is one of the most common causes of vaginitis. Men usually do not have symptoms or know they have an infection until other problems develop. Men may find out they have a yeast infection because their sex partner has a yeast infection. Uncircumcised men are more likely to get a yeast infection than circumcised men. This is because the uncircumcised glans is not exposed to air and does not remain as dry as  that of a circumcised glans. Older adults may develop yeast infections around dentures. CAUSES  Women  Antibiotics.   Steroid medication taken for a long time.   Being overweight (obese).   Diabetes.   Poor immune condition.   Certain serious medical conditions.   Immune suppressive medications for organ transplant patients.   Chemotherapy.   Pregnancy.   Menstration.   Stress and fatigue.   Intravenous drug use.   Oral contraceptives.   Wearing tight-fitting clothes in the crotch area.   Catching it from a sex partner who has a yeast infection.   Spermicide.   Intravenous, urinary, or other catheters.  Men  Catching it from a sex partner who has a yeast infection.   Having oral or anal sex with a person who has the infection.   Spermicide.   Diabetes.   Antibiotics.   Poor immune system.   Medications that suppress the immune system.   Intravenous drug use.   Intravenous, urinary, or other catheters.  SYMPTOMS  Women  Thick, white vaginal discharge.   Vaginal itching.   Redness and swelling in and around the vagina.   Irritation of the lips of the vagina and perineum.   Blisters on the vaginal lips and perineum.   Painful sexual intercourse.   Low blood sugar (hypoglycemia).   Painful urination.   Bladder infections.   Intestinal problems such as constipation, indigestion, bad breath, bloating, increase in gas, diarrhea, or loose stools.  Men  Men may develop intestinal problems such as constipation, indigestion, bad breath, bloating, increase in gas, diarrhea,  or loose stools.   Dry, cracked skin on the penis with itching or discomfort.   Jock itch.   Dry, flaky skin.   Athlete's foot.   Hypoglycemia.  DIAGNOSIS  Women  A history and an exam are performed.   The discharge may be examined under a microscope.   A culture may be taken of the discharge.  Men  A history and an exam are performed.   Any discharge from  the penis or areas of cracked skin will be looked at under the microscope and cultured.   Stool samples may be cultured.  TREATMENT  Women  Vaginal antifungal suppositories and creams.   Medicated creams to decrease irritation and itching on the outside of the vagina.   Warm compresses to the perineal area to decrease swelling and discomfort.   Oral antifungal medications.   Medicated vaginal suppositories or cream for repeated or recurrent infections.   Wash and dry the irritation areas before applying the cream.   Eating yogurt with lactobacillus may help with prevention and treatment.   Sometimes painting the vagina with gentian violet solution may help if creams and suppositories do not work.  Men  Antifungal creams and oral antifungal medications.   Sometimes treatment must continue for 30 days after the symptoms go away to prevent recurrence.  HOME CARE INSTRUCTIONS  Women  Use cotton underwear and avoid tight-fitting clothing.   Avoid colored, scented toilet paper and deodorant tampons or pads.   Do not douche.   Keep your diabetes under control.   Finish all the prescribed medications.   Keep your skin clean and dry.   Consume milk or yogurt with lactobacillus active culture regularly. If you get frequent yeast infections and think that is what the infection is, there are over-the-counter medications that you can get. If the infection does not show healing in 3 days, talk to your caregiver.   Tell your sex partner you have a yeast infection. Your partner may need treatment also, especially if your infection does not clear up or recurs.  Men  Keep your skin clean and dry.   Keep your diabetes under control.   Finish all prescribed medications.   Tell your sex partner that you have a yeast infection so they can be treated if necessary.  SEEK MEDICAL CARE IF:   Your symptoms do not clear up or worsen in one week after treatment.   You have an oral  temperature above 102 F (38.9 C).   You have trouble swallowing or eating for a prolonged time.   You develop blisters on and around your vagina.   You develop vaginal bleeding and it is not your menstrual period.   You develop abdominal pain.   You develop intestinal problems as mentioned above.   You get weak or lightheaded.   You have painful or increased urination.   You have pain during sexual intercourse.  MAKE SURE YOU:   Understand these instructions.   Will watch your condition.   Will get help right away if you are not doing well or get worse.  Document Released: 04/25/2004 Document Revised: 03/07/2011 Document Reviewed: 08/07/2009 [facility] Patient Information 2012 [place], [place].  Deep Vein Thrombosis A deep vein thrombosis (DVT) is a blood clot (thrombus) that develops in a deep vein. A DVT is a clot in the deep, larger veins of the leg, arm, or pelvis. These are more dangerous than clots that might form in veins on the surface of the body.  Deep vein thrombosis can lead to complications if the clot breaks off and travels in the bloodstream to the lungs. CAUSES Blood clots form in a vein for different reasons. Usually several things cause blood clots. They include:  The flow of blood slows down.   The inside of the vein is damaged in some way.   The person has a condition that makes blood clot more easily. These conditions may include:   Older age (especially over 54 years old).   Having a history of blood clots.   Having major or lengthy surgery. Hip surgery is particularly high-risk.   Breaking a hip or leg.   Sitting or lying still for a long time.   Cancer or cancer treatment.   Having a long, thin tube (catheter) placed inside a vein during a medical procedure.   Being overweight (obese).   Pregnancy and childbirth.   Medicines with estrogen.   Smoking.   Other circulation or heart problems.  SYMPTOMS When a clot forms, it can either  partially or totally block the blood flow in that vein. Symptoms of a DVT can include:  Swelling of the leg or arm, especially if one side is much worse.   Warmth and redness of the leg or arm, especially if one side is much worse.   Pain in an arm or leg. If the clot is in the leg, symptoms may be more noticeable or worse when standing or walking.  If the blood clot travels to the lung, it may cause:  Shortness of breath.   Chest pain. The pain may be worsened by deep breaths.   Coughing up thick mucus (phlegm), possibly flecked with blood.  Anyone with these symptoms should get emergency medical treatment right away. Call your local emergency services (911 in [place] if you have these symptoms. DIAGNOSIS If a DVT is suspected, your caregiver will take a full medical history. He or she will also perform a physical exam. Tests that also may be required include:  Studies of the clotting properties of the blood.   An ultrasound scan.   X-rays to show the flow of blood when special dye is injected into the veins (venography).   Studies of your lungs if you have any chest symptoms.  PREVENTION  Exercise the legs regularly. Take a brisk 30 minute walk every day.   Maintain a weight that is appropriate for your height.   Avoid sitting or lying in bed for long periods of time without moving your legs.   Women, particularly those over the age of 44, should consider the risks and benefits of taking estrogen medicines, including birth control pills.   Do not smoke, especially if you take estrogen medicines.   Long-distance travel can increase your risk. You should exercise your legs by walking or pumping the muscles every hour.   In hospital prevention:   Prevention may include medical and nonmedical measures.  TREATMENT  The most common treatment for DVT is blood thinning (anticoagulant) medicine, which reduces the blood's tendency to clot. Anticoagulants can stop new blood clots  from forming and old ones from growing. They cannot dissolve existing clots. Your body does this by itself over time. Anticoagulants can be given by mouth, by intravenous (IV) access, or by injection. Your caregiver will determine the best program for you.   Less commonly, clot-dissolving drugs (thrombolytics) are used to dissolve a DVT. They carry a high risk of bleeding, so they are used mainly in severe cases.  Very rarely, a blood clot in the leg needs to be removed surgically.   If you are unable to take anticoagulants, your caregiver may arrange for you to have a filter placed in a main vein in your belly (abdomen). This filter prevents clots from traveling to your lungs.  HOME CARE INSTRUCTIONS  Take all medicines prescribed by your caregiver. Follow the directions carefully.   You will most likely continue taking anticoagulants after you leave the hospital. Your caregiver will advise you on the length of treatment (usually 3 to 6 months, sometimes for life).   Taking too much or too little of an anticoagulant is dangerous. While taking this type of medicine, you will need to have regular blood tests to be sure the dose is correct. The dose can change for many reasons. It is critically important that you take this medicine exactly as prescribed, and that you have blood tests exactly as directed.   Many foods can interfere with anticoagulants. These include foods high in vitamin K, such as spinach, kale, broccoli, cabbage, collard and turnip greens, Brussels sprouts, peas, cauliflower, seaweed, parsley, beef and pork liver, green tea, and soybean oil. Your caregiver should discuss limits on these foods with you or you should arrange a visit with a dietician to answer your questions.   Many medicines can interfere with anticoagulants. You must tell your caregiver about any and all medicines you take. This includes all vitamins and supplements. Be especially cautious with aspirin and  anti-inflammatory medicines. Ask your caregiver before taking these.   Anticoagulants can have side effects, mostly excessive bruising or bleeding. You will need to hold pressure over cuts for longer than usual. Avoid alcoholic drinks or consume only very small amounts while taking this medicine.   If you are taking an anticoagulant:   Wear a medical alert bracelet.   Notify your dentist or other caregivers before procedures.   Avoid contact sports.   Ask your caregiver how soon you can go back to normal activities. Not being active can lead to new clots. Ask for a list of what you should and should not do.   Exercise your lower leg muscles. This is important while traveling.   You may need to wear compression stockings. These are tight elastic stockings that apply pressure to the lower legs. This can help keep the blood in the legs from clotting.   If you are a smoker, you should quit.   Learn as much as you can about DVT.  SEEK MEDICAL CARE IF:  You have unusual bruising or any bleeding problems.   The swelling or pain in your affected arm or leg is not gradually improving.   You anticipate surgery or long-distance travel. You should get specific advice on DVT prevention.   You discover other family members with blood clots. This may require further testing for inherited diseases or conditions.  SEEK IMMEDIATE MEDICAL CARE IF:  You develop chest pain.   You develop severe shortness of breath.   You begin to cough up bloody mucus or phlegm (sputum).   You feel dizzy or faint.   You develop swelling or pain in the leg.   You have breathing problems after traveling.  MAKE SURE YOU:  Understand these instructions.   Will watch your condition.   Will get help right away if you are not doing well or get worse.  Document Released: 03/18/2005 Document Revised: 03/07/2011 Document Reviewed: 05/10/2010 [facility] Patient Information 2012 [place], [place].  HH- RN for IV  abx- arranged with [facility]- [phone]

## 2011-09-14 NOTE — Discharge Summary (Signed)
Internal Medicine Teaching [facility] Discharge Note  Name: [name] MRN: [id number] DOB: 24-Nov-1945 66 y.o.  Date of Admission: 08/27/2011  5:24 PM Date of Discharge: 09/14/2011 Attending Physician: Farley Ly, MD  Discharge Diagnosis: Active Problems:  Chronic systolic congestive heart failure  Normocytic anemia  Esophageal stricture  Thrombocytopenia  Septic embolism  Candidal endocarditis  Fever  Candidemia  Pleural effusion  Malnutrition  V-tach  Pulmonary embolism, septic  DVT (deep venous thrombosis)   Discharge Medications: Medication List  As of 09/14/2011 11:14 AM   STOP taking these medications         oxyCODONE-acetaminophen 5-325 MG per tablet      polyethylene glycol packet      sodium chloride 0.9 % SOLN 100 mL with micafungin 50 MG SOLR 150 mg         TAKE these medications         acetaminophen 325 MG tablet   Commonly known as: TYLENOL   Take 325-650 mg by mouth every 4 (four) hours as needed. For pain/fever      aspirin 81 MG EC tablet   Take 1 tablet (81 mg total) by mouth daily.      calcium carbonate 500 MG chewable tablet   Commonly known as: TUMS - dosed in mg elemental calcium   Chew 1 tablet by mouth 3 (three) times daily.      clonazePAM 0.5 MG tablet   Commonly known as: KLONOPIN   Take 1 tablet (0.5 mg total) by mouth 2 (two) times daily as needed for anxiety.      esomeprazole 40 MG capsule   Commonly known as: NEXIUM   Take 40 mg by mouth 2 (two) times daily.      famotidine 10 MG tablet   Commonly known as: PEPCID   Take 10 mg by mouth 2 (two) times daily.      feeding supplement Liqd   Take 237 mLs by mouth 2 (two) times daily between meals.      Flaxseed (Linseed) 1000 MG Caps   Take 1 capsule (1,000 mg total) by mouth daily.      fluconazole 400-0.9 MG/200ML-% IVPB   Commonly known as: DIFLUCAN   Inject 200 mLs (400 mg total) into the vein daily.      furosemide 40 MG tablet   Commonly known  as: LASIX   Take 1.5 tablets (60 mg total) by mouth daily.      losartan 25 MG tablet   Commonly known as: COZAAR   Take 1 tablet (25 mg total) by mouth daily.      metoprolol 100 MG tablet   Commonly known as: LOPRESSOR   Take 1 tablet (100 mg total) by mouth 2 (two) times daily.      multivitamin tablet   Take 1 tablet by mouth daily with breakfast.      Rivaroxaban 15 MG Tabs tablet   Commonly known as: XARELTO   Take 1 15 mg tablet twice a day until July 4th, at which time you will switch to 20mg  tablet once a day on July 5th      vitamin C 100 MG tablet   Take 100 mg by mouth daily.            Disposition and follow-up:   CarlosAlbaro D [name] was discharged from [facility] in Stable condition.  At the hospital follow up visit please address dyspnea and cough symptoms  and compliance with xarelto.  Please also assess for fever, sweats, worsened dysphagia and healing of ICD pocket. Should check a BMET and CBC to ensure that anemia is not worsening and no further electrolyte disturbance.  Follow-up Appointments: Follow-up Information    Follow up with [name]., MD on 10/09/2011. (Appointment time 10 am)    Contact information:   [address] [place] [address] [phone]       Follow up with [name], MD on 09/24/2011. (Appointment time 3:30 pm)    Contact information:   [address] [place] [address] [phone]       Follow up with Judge Stall, MD. Schedule an appointment as soon as possible for a visit in 1 week.      Follow up with Quentin Ore, MD on 09/20/2011. (Appointment time  10:15 am)    Contact information:   [address] [place] [address] [phone]       Follow up with Lewayne Bunting, MD. (Please call and make an appointment as soon as possible with in the next 2 weeks)    Contact information:   [address] [place] [address] [phone]         Discharge Orders    Future Appointments: Provider: Department: Dept Phone: Center:   09/20/2011 10:15 AM Duwaine Maxin, MD [facility] [phone] [facility]   09/24/2011 3:30 PM [name], MD [facility] [phone] [facility]   10/09/2011 10:00 AM Vesta Mixer, MD [facility] [phone] None     Future Orders Please Complete By Expires   Diet - low sodium heart healthy      Increase activity slowly      Discharge instructions      Comments:   Please call and arrange for appt with Dr. Ladona Ridgel and your primary care doctor in next 2 weeks. Other appts have all been arranged for you and are listed. If you need to change appt time please call offices to let them know.   Call MD for:  temperature >100.4      Call MD for:  redness, tenderness, or signs of infection (pain, swelling, redness, odor or green/yellow discharge around incision site)      Call MD for:      Comments:   If you begin to feel, dizzy, nauseated, or have cough, headache, fever or chills please call your Dr. Right away or come into the emergency room.      Consultations: Treatment Team:  Rounding L[facility]cardiology, MD  Procedures Performed:  Dg Chest 1 View  08/28/2011  *RADIOLOGY REPORT*  Clinical Data: 66 year old male with pulmonary emboli.  Status post thoracentesis.  CHEST - 1 VIEW  Comparison: 08/27/2011 and earlier.  Findings: Stable right PICC line.  Stable cardiac size and mediastinal contours.  No pneumothorax.  Small residual right pleural effusion with superimposed patchy and confluent airspace disease at the right lung base.  Streaky opacity at the left base. Stable lung apices. Visualized tracheal air column is within normal limits.  IMPRESSION: No pneumothorax following thoracentesis.  Small right pleural effusion and right greater than left airspace disease as the sequelae of pulmonary emboli.  Original Report  Authenticated By: Harley Hallmark, M.D.   Dg Chest 2 View  09/10/2011  *RADIOLOGY REPORT*  Clinical Data: Shortness of breath.  Cough and congestion.  History of pulmonary embolism.  CHEST - 2 VIEW  Comparison: Chest x-ray 09/06/2011.  Chest CT 09/01/2011.  Findings: There is a right upper extremity PICC with tip terminating in the mid superior vena cava. The patient is status post median sternotomyfor CABG with a LIMA. Lung volumes are normal.  Mild elevation of the right hemidiaphragm has increased compared to the prior study.  There continues to be patchy areas of interstitial prominence and areas of airspace consolidation throughout the lungs bilaterally, most pronounced in the right middle and lower lobes.  Mild thickening of the horizontal fissure is again noted.  Small right-sided pleural effusion.  No definite left pleural effusion.  No evidence of pulmonary edema.  Heart size is borderline enlarged (unchanged). The patient is rotated to the right on today's exam, resulting in distortion of the mediastinal contours and reduced diagnostic sensitivity and specificity for mediastinal pathology.  Atherosclerosis in the thoracic aorta.  IMPRESSION: 1.  The appearance of chest is very similar to the recent prior study from 09/06/2011, but overall demonstrates slightly improved aeration.  Findings are suggestive of resolving areas of pulmonary hemorrhage related to resolving areas of pulmonary infarction. 2.  Small right-sided pleural effusion has also slightly decreased in size. 3.  Increased elevation of the right hemidiaphragm. 4.  Postoperative changes and support apparatus, as above. 5.  Atherosclerosis.  Original Report Authenticated By: Florencia Reasons, M.D.   Dg Chest 2 View  09/01/2011  *RADIOLOGY REPORT*  Clinical Data: Shortness of breath.  CHEST - 2 VIEW  Comparison: 08/31/2011  Findings: Confluent airspace opacity in the right lower lobe again noted, with patchy bilateral opacities diffusely.   Opacities have increased since prior study.  Possible small right effusion.  Right PICC line is unchanged with the tip in the right atrium.  Prior CABG. Heart is borderline in size.  IMPRESSION: Increasing patchy bilateral airspace opacities, most confluent in the right lower lobe.  Findings concerning for pneumonia.  Suspect small right effusion.  Original Report Authenticated By: Cyndie Chime, M.D.   Dg Chest 2 View  08/20/2011  *RADIOLOGY REPORT*  Clinical Data: Fever and cough.  Shortness of breath.  CHEST - 2 VIEW  Comparison: Chest x-ray 08/18/2011.  Findings: Multifocal interstitial and airspace opacities are again seen scattered throughout the lungs bilaterally, most pronounced throughout the right mid and lower lungs, as well as the left lower lobe.  There is blunting of the right costophrenic sulcus, compatible with a small right-sided pleural effusion.  No definite left-sided pleural effusion is noted.  Mild pulmonary venous congestion.  Heart size is mildly enlarged (unchanged). The patient is rotated to the left on today's exam, resulting in distortion of the mediastinal contours and reduced diagnostic sensitivity and specificity for mediastinal pathology.  Atherosclerotic calcifications are noted within the arch of the aorta. New right upper extremity PICC with tip terminating in the right atrium. Status post median sternotomy for CABG with LIMA.  IMPRESSION: 1. New right upper extremity PICC with tip terminating in the right atrium. 2.  Allowing for slight differences in patient positioning and depth of inspiration, the radiographic appearance of chest is otherwise essentially unchanged, as above.  Original Report Authenticated By: Florencia Reasons, M.D.   Dg Chest 2 View  08/18/2011  *RADIOLOGY REPORT*  Clinical Data: Shortness of breath, congestion, possible pneumonia, CHF  CHEST - 2 VIEW  Comparison: 08/17/2011; 08/16/2011; 08/15/2011  Findings: Grossly unchanged enlarged cardiac  silhouette and mediastinal contours post median sternotomy.  Lung volumes remain persistently reduced.  Interval consolidation of peripheral heterogeneous /  consolidative opacities within the right mid lung. No definite pleural effusion or pneumothorax.  Unchanged bones.  IMPRESSION: Worsening consolidative opacities within the right mid lung worrisome for progression of infection.  Continued attention on follow-up is recommended.  Original Report Authenticated By: Waynard Reeds, M.D.   Ct Chest W Contrast  09/01/2011  *RADIOLOGY REPORT*  Clinical Data: Follow-up progression of necrotic area.  CT CHEST WITH CONTRAST  Technique:  Multidetector CT imaging of the chest was performed following the standard protocol during bolus administration of intravenous contrast.  Contrast:   80 ml Omnipaque 300 IV.  Comparison: Chest CT 08/18/2011  Findings: Again noted is the large central right pulmonary embolus with extension into the right lower lobe and right middle lobe pulmonary arteries, unchanged.  Embolus and occlusion of left upper lobe pulmonary arterial branches also again noted.  Extensive airspace disease throughout the right lower lobe.  Moderate right pleural effusion, possibly partially loculated.  Airspace disease in the right lower lobe has improved slightly since prior study. Patchy areas of airspace disease in the left lung, particularly the left lower lobe are unchanged.  Increasing wedge-shaped ground- glass opacity in the left upper lobe anteriorly, possibly infarct.  Small scattered mediastinal lymph nodes, none pathologically enlarged.  No hilar or axillary adenopathy.  Visualized thyroid and chest wall soft tissues unremarkable. Imaging into the upper abdomen shows no acute findings.  IMPRESSION: Large central right pulmonary embolus again noted, unchanged. Slight improvement in airspace disease within the right lower lobe. Right effusion, partially loculated, stable.  Left lung pulmonary emboli and  ground-glass opacities again noted. New small wedge-shaped ground-glass opacity in the anterior left upper lobe, likely related to the emboli and pulmonary infarct.  Original Report Authenticated By: Cyndie Chime, M.D.   Ct Angio Chest W/cm &/or Wo Cm  08/27/2011  *RADIOLOGY REPORT*  Clinical Data: 66 year old male with recent diagnosis of acute pulmonary embolus.  Shortness of breath, rapid respiratory rate, chest pain.  CT ANGIOGRAPHY CHEST  Technique:  Multidetector CT imaging of the chest using the standard protocol during bolus administration of intravenous contrast. Multiplanar reconstructed images including MIPs were obtained and reviewed to evaluate the vascular anatomy.  Contrast: OMNIPAQUE IOHEXOL 350 MG/ML SOLN  Comparison: 08/18/2011 and earlier.  Findings: Good contrast bolus timing in the pulmonary arterial tree.  The bilateral pulmonary emboli with bulky volume of right pulmonary artery thrombus again noted.  No significant interval change.  No saddle embolus.  Left straightening of the interventricular cardiac septum on today's images.  Increased layering right pleural effusion.  No pericardial or left pleural effusion.  Stable small mediastinal lymph nodes.  Right PICC line in place.  Stable visualized upper abdominal viscera.  Sequelae median sternotomy. Stable visualized osseous structures.  Right lower lobe airspace disease has not significantly changed. Right middle lobe occasional peribronchovascular nodularity is stable.  Decreased but not resolved left lower lobe superior segment airspace disease.  Left superior anterior chest wall hematoma containing some gas re- identified.  IMPRESSION: 1.  Bilateral pulmonary emboli with bulky right lower lobe thrombus has not significantly changed since 08/18/2011. 2.  Mildly increased right pleural effusion.  Stable extensive right lower lobe airspace disease.  Stable or minimally improved ventilation elsewhere. 3. Small anterior chest wall  hematoma with trace gas re-identified.  Original Report Authenticated By: Harley Hallmark, M.D.   Ct Angio Chest W/cm &/or Wo Cm  08/18/2011  *RADIOLOGY REPORT*  Clinical Data: Fevers, cough, and endocarditis.  CT  ANGIOGRAPHY CHEST  Technique:  Multidetector CT imaging of the chest using the standard protocol during bolus administration of intravenous contrast. Multiplanar reconstructed images including MIPs were obtained and reviewed to evaluate the vascular anatomy.  Contrast: 80mL OMNIPAQUE IOHEXOL 350 MG/ML SOLN  Comparison: Chest x-ray dated 08/18/2011 and chest CT dated 07/18/2011  Findings: The patient has a massive right pulmonary embolus completely obstructing the pulmonary arteries to the right lower lobe.  There is still some flow to the right middle lobe and in and to the right upper lobe.  There are multiple smaller pulmonary emboli in the left lung primarily in the left upper lobe.  There is a moderate right pleural effusion.  There is an interstitial infiltrate in the right lower lobe which may be secondary to pulmonary infarction.  There is a small patchy infiltrate in the superior segment of the left lower lobe which could be infarction due to an embolus.  There is a new irregular air containing fluid pocket possible hematoma at the site of the previous pacemaker generator.  This may represent a postoperative hematoma with postoperative air and given the patient's clinical history this could represent an abscess.  Heart is at the upper limits of normal in size.  Evidence of prior CABG.  There are a few small reactive mediastinal lymph nodes, more prominent than on the prior study.  Visualized portion of the upper abdomen demonstrates a few small gallstones.  IMPRESSION:  1.  Massive right pulmonary embolus with probable infarction in the right lower lobe and possibly within the lateral segment of the right middle lobe. 2.  Moderate right pleural effusion. 3.  Multiple smaller emboli in the left  lung with a either a patchy pneumonia or possible pulmonary infarct in the superior segment of the left lower lobe. 4.  Cholelithiasis.  Critical Value/emergent results were called by telephone at the time of interpretation on 08/18/2011  at 7:00 p.m.  to  the patient's nurse, Efraim Kaufmann, who verbally acknowledged these results.  Original Report Authenticated By: Gwynn Burly, M.D.   Mr [facility] Wo Contrast  09/05/2011  *RADIOLOGY REPORT*  Clinical Data:  History of infected cardiac device removed 1 month ago.  Question of infarcts / septic emboli or mycotic aneurysm.  MRI HEAD WITH AND WITHOUT CONTRAST MRA HEAD WITHOUT CONTRAST  Technique: Multiplanar, multiecho pulse sequences of the brain and surrounding structures were obtained according to standard protocol with and without intravenous contrast.  Angiographic images of the Circle of Willis were obtained using MRA technique without intravenous contrast.  Contrast: 17 ml MultiHance.  Comparisonnone.  MRI HEAD  Findings: Motion degraded exam.  No acute infarct.  No intracranial hemorrhage.  Small remote right corona radiata infarct.  Mild nonspecific white matter type changes may reflect result of small vessel disease.  No intracranial mass or abnormal enhancement.  Heterogeneous bone marrow may reflect result of anemia.  Partial opacification inferior aspect of the left mastoid air cells without obstructing lesions seen in the region of the posterior- superior nasopharynx contributing to left sided eustachian tube dysfunction.  Minimal to mild paranasal sinus mucosal thickening.  IMPRESSION: Motion degraded exam.  No acute infarct.  Remote small infarct posterior right corona radiata with scattered nonspecific white matter type changes which may reflect result of small vessel disease.  Mild global atrophy without hydrocephalus.  No abnormal intracranial enhancing lesion/mass.  Heterogeneous bone marrow may reflect result of underlying anemia.  Left mastoid air  cell partial opacification and minimal to  mild paranasal sinus mucosal thickening  MRA HEAD  Findings: Small bulge superior margin proximal A1 segment left anterior cerebral artery.  This is not a typical position for a saccular aneurysm or mycotic aneurysm.  Small vessel may arise from this structure.  Stability can be confirmed on follow-up.  Middle cerebral artery and A2 segment anterior cerebral artery branch vessel irregularity without discrete aneurysm.  Anterior circulation without medium or large size vessel significant stenosis or occlusion.  Full extent of the left PICA not included on present examination. Nonvisualization right PICA with large right AICA. Nonvisualization left AICA.  Mild narrowing distal right vertebral artery.  No high-grade stenosis of the basilar artery.  Superior cerebellar artery and posterior cerebral artery mild branch vessel irregularity without discrete aneurysm.  IMPRESSION: Small bulge superior margin proximal A1 segment left anterior cerebral artery.  This is not a typical position for a saccular aneurysm or mycotic aneurysm.  Small vessel may arise from this structure.  Stability can be confirmed on follow-up.  Please see above discussion.  Original Report Authenticated By: Fuller Canada, M.D.   Mr Laqueta Jean Wo Contrast  09/05/2011  *RADIOLOGY REPORT*  Clinical Data:  History of infected cardiac device removed 1 month ago.  Question of infarcts / septic emboli or mycotic aneurysm.  MRI HEAD WITH AND WITHOUT CONTRAST MRA HEAD WITHOUT CONTRAST  Technique: Multiplanar, multiecho pulse sequences of the brain and surrounding structures were obtained according to standard protocol with and without intravenous contrast.  Angiographic images of the Circle of Willis were obtained using MRA technique without intravenous contrast.  Contrast: 17 ml MultiHance.  Comparison none.  MRI HEAD  Findings: Motion degraded exam.  No acute infarct.  No intracranial hemorrhage.  Small remote  right corona radiata infarct.  Mild nonspecific white matter type changes may reflect result of small vessel disease.  No intracranial mass or abnormal enhancement.  Heterogeneous bone marrow may reflect result of anemia.  Partial opacification inferior aspect of the left mastoid air cells without obstructing lesions seen in the region of the posterior- superior nasopharynx contributing to left sided eustachian tube dysfunction.  Minimal to mild paranasal sinus mucosal thickening.  IMPRESSION: Motion degraded exam.  No acute infarct.  Remote small infarct posterior right corona radiata with scattered nonspecific white matter type changes which may reflect result of small vessel disease.  Mild global atrophy without hydrocephalus.  No abnormal intracranial enhancing lesion/mass.  Heterogeneous bone marrow may reflect result of underlying anemia.  Left mastoid air cell partial opacification and minimal to mild paranasal sinus mucosal thickening  MRA HEAD  Findings: Small bulge superior margin proximal A1 segment left anterior cerebral artery.  This is not a typical position for a saccular aneurysm or mycotic aneurysm.  Small vessel may arise from this structure.  Stability can be confirmed on follow-up.  Middle cerebral artery and A2 segment anterior cerebral artery branch vessel irregularity without discrete aneurysm.  Anterior circulation without medium or large size vessel significant stenosis or occlusion.  Full extent of the left PICA not included on present examination. Nonvisualization right PICA with large right AICA. Nonvisualization left AICA.  Mild narrowing distal right vertebral artery.  No high-grade stenosis of the basilar artery.  Superior cerebellar artery and posterior cerebral artery mild branch vessel irregularity without discrete aneurysm.  IMPRESSION: Small bulge superior margin proximal A1 segment left anterior cerebral artery.  This is not a typical position for a saccular aneurysm or mycotic  aneurysm.  Small vessel may arise  from this structure.  Stability can be confirmed on follow-up.  Please see above discussion.  Original Report Authenticated By: Fuller Canada, M.D.   Dg Chest Bilateral Decubitus  09/01/2011  *RADIOLOGY REPORT*  Clinical Data: Shortness of breath.  Pleural effusions, status post thoracentesis.  CHEST - BILATERAL DECUBITUS VIEW  Comparison: Chest x-ray earlier today and 08/31/2011  Findings: There is a small to moderate layering right pleural effusion noted on the decubitus view.  IMPRESSION: Small to moderate layering right effusion.  Original Report Authenticated By: Cyndie Chime, M.D.   Dg Chest Port 1 View  09/06/2011  *RADIOLOGY REPORT*  Clinical Data: Evaluate PICC line placement  PORTABLE CHEST - 1 VIEW  Comparison: 09/01/2011  Findings:  Right arm PICC line tip is in the SVC.  There is mild cardiac enlargement and mild interstitial edema. Partially loculated right pleural effusion is again noted.  Airspace opacities noted within the left midlung, similar to previous exam.  IMPRESSION:  1.  No change and pulmonary edema and pleural effusion. 2.  Persistent left midlung airspace opacity.  Original Report Authenticated By: Rosealee Albee, M.D.   Dg Chest Port 1 View  08/31/2011  *RADIOLOGY REPORT*  Clinical Data: Follow-up.  CHF.  PORTABLE CHEST - 1 VIEW  Comparison: 08/29/2011  Findings: Continued opacity in the right lower lung, likely pneumonia.  Heart is mildly enlarged.  Prior CABG.  No confluent opacity on the left.  Right PICC line remains in place with the tip in the right atrium.  Possible small right effusion.  IMPRESSION: Continued right lower lobe consolidation concerning for pneumonia. Possible small right effusion.  Original Report Authenticated By: Cyndie Chime, M.D.   Dg Chest Port 1 View  08/29/2011  *RADIOLOGY REPORT*  Clinical Data: Cough, shortness of breath.  PORTABLE CHEST - 1 VIEW  Comparison: 08/28/2011  Findings: Right PICC line remains  in place with the tip in the right atrium, unchanged.  Prior CABG.  Consolidation in the right lower lobe, unchanged.  No focal opacity on the left.  Heart is mildly enlarged.  IMPRESSION: Continued right lower lobe consolidation.  Improving opacities in the left lung.  Mild cardiomegaly.  Original Report Authenticated By: Cyndie Chime, M.D.   Dg Bone Survey Met  08/19/2011  *RADIOLOGY REPORT*  Clinical Data: M spike on SPEP and immune suppression, evaluate for lytic lesions of multiple myeloma  METASTATIC BONE SURVEY  Comparison: Chest CT - 08/18/2011; CT abdomen pelvis - 07/18/2011; right hip CT - 08/10/2011  Findings:  There is a possible 1.2 x 2.2 cm lucency within the vertex of the calvarium.  Otherwise, no discrete lytic lesions are identified.  Degenerative change within the lumbar spine, worse at the L1 - L2 and L4 - L5. Multilevel cervical spine degenerative change.  Post mediastinotomy and CABG.  Surgical clips overlying the medial aspect of the right upper thigh. Vascular calcifications.  IMPRESSION:  Possible 2.2 cm lucency within the vertex of the calvarium. Further evaluation with head CT of brain MRI may be performed as clinically indicated. Otherwise, no additional lucent lesions identified.  Original Report Authenticated By: Waynard Reeds, M.D.   Dg C-arm 1-60 Min-no Report  08/15/2011  CLINICAL DATA: ICD extraction   C-ARM 1-60 MINUTES  Fluoroscopy was utilized by the requesting physician.  No radiographic  interpretation.     US Thoracentesis Asp Pleural Space W/img Guide  08/28/2011  *RADIOLOGY REPORT*  Clinical Data:  Patient with pleuritic chest pain, recent finding  of pulmonary embolus, right-sided pleural effusion  ULTRASOUND GUIDED right THORACENTESIS  Comparison:  None  An ultrasound guided thoracentesis was thoroughly discussed with the patient and questions answered.  The benefits, risks, alternatives and complications were also discussed.  The patient understands and wishes  to proceed with the procedure.  Written consent was obtained.  Ultrasound was performed to localize and mark an adequate pocket of fluid in the right chest.  The area was then prepped and draped in the normal sterile fashion.  1% Lidocaine was used for local anesthesia.  Under ultrasound guidance a 19 gauge Yueh catheter was introduced.  Thoracentesis was performed.  The catheter was removed and a dressing applied.  Complications:  The patient had mild vasovagal response post procedure with a slight drop in his blood pressure to 98/72. He recovered well after and had no loss of consciousness. BP was then back to baseline at 119/72  Findings: A total of approximately 240 ml of blood tinged serous fluid was removed. A fluid sample was sent for laboratory analysis.  IMPRESSION: Successful ultrasound guided right thoracentesis yielding 240 ml of pleural fluid.  Read by Brayton El PA-C  Original Report Authenticated By: Reola Calkins, M.D.   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 Although he was given ancef pre-extraction of the pacer lead, he became septic with need for MICU level of care including intubation. He clinical status improved with phenylephrine drip for hypotension and broad coverage antibiotics of vancomycin and zosyn. All cultures have remained without growth of other organisms. His procedure was complicated by massive pulmonary embolism with pulmonary infarction and other emboli found on CT Angiogram of chest (presumably from embolized clot from pacer lead). Although he continued to have intermittent fevers, he improved  clinically and was discharged 3 days prior to this presentation today. Mr. Heinicke followed up with Dr. Daiva Eves of Infectious Diseases 08/27/2011 whereby he reported continued subjective fevers, shortness of breath, and new chest pain when he coughed. He was tachycardic in 120s with mild fever and stable blood pressure. Dr. Daiva Eves arranged for direct admission for telemetry to rule out evolution of pulmonary process to necrosis or fungemia or other bacterial etiology.  Hospital Course by problem list:   Pleural effusion / pulmonary embolism / pulmonary infarction - Patient's CC this admission was that he had CP when he coughed and was having some SOB. He underwent a CT angio chest on 08/27/11 which revealed an increasing right pleural effusion for which he underwent US guided thoracentesis on 08/28/11 draining approx. 240 mL of exudative fluid. He then had relief from his CP and SOB and felt much better, however this was transient and he then continued to have days where his breathing felt better and/or worse than others. A follow-up CT chest on 09/01/11 showed his unchanged central right PA Candidal embolus (with extension to RML, RLL), and embolus/occlusion of LUL pulmonary artery branches. His right pleural effusion after drainage showed some possible loculations. Also noted, were wedge shaped infarcts noted in the RML and LUL. His pleural fluid was sent for AFB culture with smear, fungus culture, and anerobic culture all of which were negative.  For his SOB and cough he was managed with xopenex and atrovent nebulizers with good relief. He was also encouraged to use his incentive spirometer to help expectorate anything he had settling in his lungs, again with good response.   Normocytic anemia - Patient was discharged on 08/23/11 with a Hgb of 8.9 and on readmission on 5/28 his Hgb = 9.1. Since admission his Hgb had slowly trended down and stabilized approximately (7.3-7.6). He first successive FOBT checks were  negative however he had been constipated for multiple days soon after admission. Once his constipation was treated he had a positive FOBT with small red streaks per RN. Of note the patient does have a history of internal hemorrhoids. GI consult was obtained after the +FOBT and his protonix was increased from 40mg  po daily to 40mg  IV BID. His MCV did trend down (83-->79.9) throughout the admission but has remained within normocytic range. His threshold to transfuse remained at 7mg /dL. On his last admission his anemia panel showed AOCD vs. Iron deficiency anemia with transiently elevated ferritin. Iron = 21, Sat ratio = 9, TIBC =243, Ferritin = 597, Retic count =1.9, FOBT negative. Smear revealed mild anemia with normal morphology. Ferritin level was repeated on 6/11 and remained greatly elevated at 1143.   Distal Femoral Vein thrombosis - Patient had LE dopplers performed on 09/03/11 that showed a DVT in his right femoral vein distally. In order to begin anticoagulation, it was necessary to discern whether or not he had septic emboli in his brain and/or the presence of mycotic aneurysms given his recent history of candidemia. Decision for this route of treatment was multidisciplinary with input from cardiology, infectious disease, and [facility] infectious disease. His MRI/MRA on 09/04/11 was only notable for a small bulge of the proximal A1 segment of the left ACA, which was determined by radiology as an atypical location for mycotic aneurysms. He was then started on a heparin drip on 09/05/11 with no bolus and the intention was to target the lower end of therapeutic range. This was then followed by starting by Coumadin on 09/09/11. Continued discussion with pharmacy and the patients' overall medical condition led toward the decision to change him from Coumadin to Xarelto on 09/12/11. Dosing of Xarelto would be 15 mg BID x 21 days and then 20 mg daily thereafter for the duration of therapy (minimum 6 months). The  benefits would be less drug interaction with fluconazole, no required lab work for monitoring (e.g. PT/INR), and easy onset/offset mechanism of action. He did not show any signs or symptoms of bleeding from therapy with heparin, Coumadin, or Xarelto.   Bacteriuria and suspected HAP - On 08/31/11, patient developed worsening symptoms of his CP, SOB, and overall had begun to feel quite poor in his state of health. CXR revealed a RLL consolidation that was concerning for pneumonia and/or a small effusion. He was started on Vanc and Zosyn per ID for suspected HAP. Blood cultures and urine cultures were obtained as patient was febrile during this episode. His blood cultures were negative after allotted time for growth and his urine culture grew 85,000 col/mL of Klebsiella which was pansensitive except to amoxicillin. His overall antibiotic course was vanc (08/31/11>>09/03/11), zosyn (08/31/11>>09/04/11), cipro (09/04/11>>09/07/11), cefepime (09/07/11>>09/09/11). Due to his negative blood cultures his vanc and zosyn were discontinued and his Klebsiella UTI was treated with a total of a 10 day course of abx (including vanc/zosyn). Decision to change cipro to cefepime was per ID to prevent development of C. Diff  in this patient.   Non-sustained ventricular tachycardia - The patient had short asymptomatic runs of NSVT after the removal of his biventricular ICD. On 09/04/11 he had an episode of symptomatic NSVT of 47 beats over 10 seconds. Patient was then transferred to 2900, CICU and defib pads were placed on the patient's chest. Cardiology began increasing his metoprolol dose from 50 BID up to 100 BID with a goal of K>3.9 and Mg>1.9. He remained free from NSVT from that episode until the early morning of 09/09/11 when he had another short-lived asymptomatic episode of NSVT.   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 The patient's underlying heart pathology became apparent with worsening of his overall wellbeing and hemodynamics. Follow-up 2D echo (08/23/11) revealed EF = 30% with diffuse hypokinesis, mild LVH, and a moderately dilated LA. Cardiology was consulted and his metoprolol was increased up to 100 mg BID which he tolerated well. He was also started on losartan 25 mg daily, noting that in the past his reaction to ARB's were hypotension. Lasix was also continued at 60 mg daily.   Tachycardia - The patient began developing tachycardia after removal of his ICD and progression continued despite increases in his metoprolol. Upon titration of metoprolol up to 100mg  BID his rate started to come under control slowly. His tachycarida was also largely multifactorial in the setting of his anxiety, stress, anemia, heart disease, and SOB.   Candidemia/Recurrent Fever - The patient has had a history of multiple recurrences of esophageal Candidiasis with treatment with fluconazole and yearly dilatations. His Candida had seeded to the atrial lead in his right atrium and was considered the cause of his FUO on first admission. His biVICD was removed and he was started on micafungin technically on 08/13/11, but therapy start date is considered 08/15/11 as that was the date of ICD removal. Culture and sensitivities were received back from the lab showng C. Albicans sensitive to both micafungin and fluconazole and he was changed to fluconazole 400 mg IV daily on 09/02/11. The remainder of his blood, fungal, and AFB cultures remained negative. Of note, his old pacer pocket was closely monitored and he was not found to have any signs of inflammation/infection (no rubor, tumor, dolor, calor). Fever did not recur after 08/31/11.  Hyponatremia - The patient began developing some mild hyponatremia after removal of his biV ICD. His sodium dropped as low as 128 however this gradually resolved with time. No intervention was  required and he was closely monitored.   GERD / Esophageal Candidiasis - Patient has a history of GERD, esophageal Candidiasis, and esophageal strictures requiring dilatation over the past several years. His Candida has been treated with micafungin which was changed to fluconazole once sensitivities were received. He was also on Protonix 40 mg daily for his GERD. Once his +FOBT was discovered, GI was consulted and his protonix was modified to 40 mg IV BID. His most recent dilatation was on 08/13/11 to allow his TEE to be performed. He does note that he still has some minor discomfort with swallowing larger pills and thinks that his dilatation was not as complete as in the past.   Anxiety / Insomnia - Throughout his hospital course he began developing anxiety at night that was causing him a profound lack of sleep, agitation, and stress. Trazodone 50 mg nightly was added, and home dose regimen of clonazepam 0.5mg   BID increased to 1 mg nightly and 0.5 mg daily. This worked well at first, however the patient became oversedated one night and upon attempt to ambulate to get his bedside urinal he sustained a fall, however there was no injury or harm to the patient. His regimen was then modified to discontinuation of trazodone and continuing clonazepam 1 mg at night and 0.5 mg daily.   Cough - The patient developed a debilitating cough during his hospital stay that was dry and nonproductive in nature. It prevented him from sleeping some nights as it would keep him up most of the night. Incentive spirometer was recommended to the patient to help him loosen up any phlegm in his lungs and expand his lung fields back. For his pleurisy and pulmonary infarct pain he was also started on Percocet which not only treated his pain, but relieved his cough effectively. The patient would request a dose before bedtime to suppress his cough and pain, thus allowing restful sleep.  MGUS vs. Reactive gammopathy - During his previous  admission he was worked up for MM vs. MGUS vs. reactive gammopathy. MM was ruled out after bone survey revealed no characteristic lytic lesions and consult with hematology. He had a protein gap on his first admission that raised suspicion. 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 An SPEP was also obtained and revealed increased IgM and IgA. Hematology was onboard during this workup and their recommendation was to wait until the Candidal infection was completely resolved (approx. 6 mths or longer) and to then recheck his SPEP/UPEP.   Discharge Vitals:  BP 129/82  Pulse 89  Temp 98 F (36.7 C) (Oral)  Resp 19  Ht 5\' 10"  (1.778 m)  Wt 173 lb (78.472 kg)  BMI 24.82 kg/m2  SpO2 95%  Discharge Labs:  Results for orders placed during the hospital encounter of 08/27/11 (from the past 24 hour(s))  CBC     Status: Abnormal   Collection Time   09/14/11  5:21 AM      Component Value Range   WBC 5.5  4.0 - 10.5 K/uL   RBC 3.28 (*) 4.22 - 5.81 MIL/uL   Hemoglobin 8.0 (*) 13.0 - 17.0 g/dL   HCT 82.9 (*) 56.2 - 13.0 %   MCV 80.5  78.0 - 100.0 fL   MCH 24.4 (*) 26.0 - 34.0 pg   MCHC 30.3  30.0 - 36.0 g/dL   RDW 86.5 (*) 78.4 - 69.6 %   Platelets 346  150 - 400 K/uL   Signed: Margorie John 09/14/2011, 11:14 AM   Time Spent on Discharge: greater than 60 minutes

## 2011-09-14 NOTE — Progress Notes (Addendum)
Discharge Note:  S: patient is feeling well, is anxious to go home. He is not having any pleuritic pain or shortness of breath. No coughing.  O:  Filed Vitals:   09/14/11 0500  BP: 129/82  Pulse: 89  Temp: 98 F (36.7 C)  Resp: 19  General: pleasant elderly man resting in bed HEENT: PERRL, EOMI, no scleral icterus Cardiac: RRR, no rubs, murmurs or gallops Pulm: scattered crackles and rhonchi, moving normal volumes of air Abd: soft, nontender, nondistended, BS present Ext: warm and well perfused, no pedal edema Neuro: alert and oriented X3, cranial nerves II-XII grossly intact  A/P: -Will touch base with cardiology and ensure that patient is fitted for life vest this AM. -will get dose of fluconazole given early today at 10:30 so that he can be ready for discharge as soon as possible -added appt with Dr. Ladona Ridgel to follow up list -will be on xarelto 15mg  BID until 7/4 and then will switch to 20mg  once daily thereafter for at least 3 months of treatment

## 2011-09-14 NOTE — Progress Notes (Signed)
CARE MANAGEMENT NOTE 09/14/2011  Patient:  [name], [name]   Account Number:  [id number]  Date Initiated:  08/29/2011  Documentation initiated by:  GRAVES-BIGELOW,BRENDA  Subjective/Objective Assessment:   Pt admitted with new onset cp- r pleural effusion. S/p thoracentesis. Pt is active with [facility] for RN/ PT services. Pt is a little more weaker now than on admission.     Action/Plan:   PT has been consulted for disposition needs. CM will continue ot monitor for disposiiton needs. Will do a benefits check for IV medicaiton micafungin IV.   Anticipated DC Date:  09/12/2011   Anticipated DC Plan:  HOME W HOME HEALTH SERVICES      DC Planning Services  CM consult      [facility] Choice  Resumption Of Svcs/PTA Provider   Choice offered to / List presented to:  C-1 Patient        HH arranged  HH-1 RN  IV Antibiotics      HH agency  [facility]   Status of service:  Completed, signed off Medicare Important Message given?   (If response is "NO", the following Medicare IM given date fields will be blank) Date Medicare IM given:   Date Additional Medicare IM given:    Discharge Disposition:  HOME W HOME HEALTH SERVICES  Per UR Regulation:  Reviewed for med. necessity/level of care/duration of stay  If discussed at Long Length of Stay Meetings, dates discussed:   09/04/2011  09/11/2011    Comments:  09/14/2011 1210 Pt is being fitted with Lifevest today. Contacted [facility] for scheduled d/c home today. Per Unit RN, pt had dose of IV abx today and next dose scheduled for 09/15/2011. Isidoro Donning RN CCM Case Mgmt phone [phone]   09/13/11- 1500- Donn Pierini RN, BSN [phone] Pt will need home IV abx, has PICC- spoke with pt and wife at bedside and confirmed [facility] services resumption with [facility]- as pt had HH services with [facility] PTA-  prescription for IV abx in shadow chart for [facility]- MD to sign in am. Referral sent to [facility] for HH-RN for IV abx via [facility] and spoke with Hilda Lias with [facility]  regarding HH needs. Pt will not need HH-PT at discharge per PT notes. Pt will also be fitted with a Lifevest per cards prior to discharge. Morrie Sheldon with [employer] lifevest aware along with Tresa Endo from the cards office. Pt is to be fitted either friday afternoon or Sat. morning.  6/12 debbie dowell rn,bsn has 30.00 copay for xarelto. has 4.00 copay for iv diflucan.  6/10 debbie dowell rn,bsn remains on iv antibiotics.  6/7 debbie dowell rn,bsn still not feeling great. wanted to speak w sw about notary. act w [facility] pta. cont to follow.  6/6 debbie dowell rn,bsn transf to critical care. cont iv antibiotics, some v tach.  6/3 debbie dowell rn,bsn [phone] being treated for pneumonia, iv lasix for shortness of breath.   08-30-11 [address] [place] [phone] CM  did speak to pt and did a benefits check co pay cost will be 33% of cost of med for pt. Per wife she had contacted insurance and they stated that for 4 day supply they would have to pay 708.00 and each week after that will be 318.49 for the rest of the 6 weeks. CM did speak to resident and they will discuss with the pt what the plan will be for disposition and med needs.  CM did call [facility] and spoke to liaison and she stated  that Iv med caost from there pharmacy will be 318.49. CM will continue to monitor for assistance.

## 2011-09-14 NOTE — Progress Notes (Signed)
Pt dc home c wife, hh continued with [facility], pt fitted and dcd with life vest.

## 2011-09-17 LAB — FUNGUS CULTURE W SMEAR

## 2011-09-18 ENCOUNTER — Telehealth: Payer: Self-pay | Admitting: *Deleted

## 2011-09-18 NOTE — Telephone Encounter (Signed)
Katie from [employer] called and asked when the IV Diflucan is to be D/C. She advised that the patient and his wife think he is to have it until his July 25 visit. [facility] only has an order and Rx for 8 days which would put his last dose as Sunday 09/22/11. She just needs to know if he needs a refill or if the patient is to be D/C. Advised her will check with the provider and call her back with an answer this afternoon.  Contact Katie @ [phone] with answer.

## 2011-09-18 NOTE — Telephone Encounter (Signed)
Patient notified [name]  

## 2011-09-18 NOTE — Telephone Encounter (Signed)
I would like fluconazole to be continued through his upcoming visit with me on June (not July) 25.

## 2011-09-19 ENCOUNTER — Telehealth: Payer: Self-pay | Admitting: *Deleted

## 2011-09-19 NOTE — Telephone Encounter (Signed)
Spoke with [name] at [facility] and gave verbal order per Dr. Orvan Falconer to continue Fluconazole through 09/24/11.  He has an appointment on that day with Dr. Orvan Falconer. Wendall Mola CMA

## 2011-09-20 ENCOUNTER — Encounter: Payer: Self-pay | Admitting: Internal Medicine

## 2011-09-20 ENCOUNTER — Ambulatory Visit (INDEPENDENT_AMBULATORY_CARE_PROVIDER_SITE_OTHER): Payer: Medicare Other | Admitting: Internal Medicine

## 2011-09-20 ENCOUNTER — Encounter: Payer: Medicare Other | Admitting: Internal Medicine

## 2011-09-20 VITALS — BP 100/65 | HR 83 | Temp 97.0°F | Wt 174.4 lb

## 2011-09-20 DIAGNOSIS — R0981 Nasal congestion: Secondary | ICD-10-CM | POA: Insufficient documentation

## 2011-09-20 DIAGNOSIS — B376 Candidal endocarditis: Secondary | ICD-10-CM

## 2011-09-20 DIAGNOSIS — R918 Other nonspecific abnormal finding of lung field: Secondary | ICD-10-CM

## 2011-09-20 DIAGNOSIS — I82409 Acute embolism and thrombosis of unspecified deep veins of unspecified lower extremity: Secondary | ICD-10-CM

## 2011-09-20 DIAGNOSIS — I509 Heart failure, unspecified: Secondary | ICD-10-CM

## 2011-09-20 DIAGNOSIS — J3489 Other specified disorders of nose and nasal sinuses: Secondary | ICD-10-CM

## 2011-09-20 DIAGNOSIS — I5022 Chronic systolic (congestive) heart failure: Secondary | ICD-10-CM

## 2011-09-20 MED ORDER — RIVAROXABAN 20 MG PO TABS: 20.0000 mg | ORAL_TABLET | Freq: Every day | ORAL | Status: DC

## 2011-09-20 MED ORDER — LORATADINE 10 MG PO TABS: 10.0000 mg | ORAL_TABLET | Freq: Every day | ORAL | Status: DC
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
## 2011-09-20 NOTE — Assessment & Plan Note (Signed)
Assessment: Stable on Rivaroxaban.  Plan: After July 4, the patient will decrease dose from 15 mg twice a day down to 20 mg once daily. Prescription written today and given to the patient.

## 2011-09-20 NOTE — Assessment & Plan Note (Addendum)
Assessment: Sinus congestion is likely allergic rhinitis secondary to allergy. Concern would be potential fungal sinus infection. My concern for this is low as the patient only seems to have sinus fullness rather than pain, fever, chills, headache, blurry vision, malaise or stupor. Postnasal drip is likely irritated lungs and increasing cough frequency.  Plan: While intranasal steroids are first line for allergic rhinitis, my concern is that these may predispose to fungal infection. Will give prescription for loratadine 10 mg daily. I instructed the patient that if he should develop fever, chills, headaches, sinus pain, blurry vision or drowsiness, he should call Dr. Orvan Falconer or go to the ED immediately.

## 2011-09-20 NOTE — Patient Instructions (Addendum)
When you're done taking the 15 mg dose of Xarelto, please begin taking the once daily 20 mg dose.  Please weigh yourself everyday. If you gain more than 3 pounds in one day, take an extra dose of Lasix. If you lose more than 3 pounds in one day, skip a dose of Lasix.  I have written you a prescription for loratadine for your sinus congestion and allergies. At [facility] or [facility], this medication will be for dollars per month. If you have any worsening of her sinus congestion or develops fevers, chills, headache, vision changes  Return to clinic to see your PCP as previously scheduled.  Please bring all your medications to your next clinic appointment.   Sinus congestion HOME CARE INSTRUCTIONS   Use saline nasal sprays to help moisten your sinuses. The sprays can be found at your local drugstore.   Take loratadine as prescribed SEEK IMMEDIATE MEDICAL CARE IF:  You have a fever.   You have increasing pain, severe headaches, or toothache.   You have nausea, vomiting, or drowsiness.   You develop unusual swelling around the face or trouble seeing.  MAKE SURE YOU:   Understand these instructions.   Will watch your condition.   Will get help right away if you are not doing well or get worse.

## 2011-09-20 NOTE — Assessment & Plan Note (Signed)
Assessment: Stable and improving well on Diflucan therapy. Patient is no longer having fevers, chills or malaise which characterized his chronic candidal infection.   Plan: Continue fluconazole as currently prescribed. Patient will followup with Dr. Orvan Falconer for further management

## 2011-09-20 NOTE — Discharge Summary (Signed)
Internal Medicine Attending  Date: 09/20/2011  Patient name: [name] Medical record number: [id number] Date of birth: 15-Oct-1945 Age: 66 y.o. Gender: male  For treatment of his Candidal cardiac device endocarditis, infectious disease consult recommended continuing IV fluconazole for a total 6 week treatment course (including the time he was on micafungin) timed from date of ICD extraction on 08/15/2011, and then oral suppression thereafter.  Patient was fitted with a Life Vest as per cardiology recommendation prior to discharge.

## 2011-09-20 NOTE — Assessment & Plan Note (Addendum)
Assessment: NYHA Class III. currently stable on medical therapy.  Plan: Continue current therapy. We'll not check bmp today as patient will followup with PCP next week

## 2011-09-24 ENCOUNTER — Ambulatory Visit (INDEPENDENT_AMBULATORY_CARE_PROVIDER_SITE_OTHER): Payer: Medicare Other | Admitting: Internal Medicine

## 2011-09-24 ENCOUNTER — Inpatient Hospital Stay: Payer: Medicare Other | Admitting: Internal Medicine

## 2011-09-24 ENCOUNTER — Encounter: Payer: Self-pay | Admitting: Internal Medicine

## 2011-09-24 VITALS — BP 110/74 | HR 91 | Temp 97.6°F | Ht 70.0 in | Wt 172.0 lb

## 2011-09-24 VITALS — BP 110/68 | Resp 19 | Ht 66.0 in | Wt 172.0 lb

## 2011-09-24 DIAGNOSIS — B376 Candidal endocarditis: Secondary | ICD-10-CM

## 2011-09-24 DIAGNOSIS — I5022 Chronic systolic (congestive) heart failure: Secondary | ICD-10-CM

## 2011-09-24 DIAGNOSIS — I509 Heart failure, unspecified: Secondary | ICD-10-CM

## 2011-09-24 DIAGNOSIS — Z95810 Presence of automatic (implantable) cardiac defibrillator: Secondary | ICD-10-CM

## 2011-09-24 LAB — FUNGUS CULTURE W SMEAR: Fungal Smear: NONE SEEN

## 2011-09-24 LAB — AFB CULTURE, BLOOD

## 2011-09-24 MED ORDER — FLUCONAZOLE 100 MG PO TABS: 200.0000 mg | ORAL_TABLET | Freq: Every day | ORAL | Status: DC

## 2011-09-24 NOTE — Progress Notes (Signed)
Patient ID: [name], male   DOB: 09/04/1945, 66 y.o.   MRN: [id number]    [facility] for Infectious Disease  Patient Active Problem List  Diagnosis  . Chronic systolic congestive heart failure  . LBBB  . CONGESTIVE HEART FAILURE, LEFT  . ICD - IN SITU  . CAD (coronary artery disease)  . Normocytic anemia  . Esophageal stricture  . Thrombocytopenia  . Multiple pulmonary nodules  . Acute respiratory failure with hypoxia  . Candidal endocarditis  . Cotton wool spots  . Pleural effusion  . Malnutrition  . V-tach  . Pulmonary embolism, septic  . DVT (deep venous thrombosis)    Patient's Medications  New Prescriptions   FLUCONAZOLE (DIFLUCAN) 100 MG TABLET    Take 2 tablets (200 mg total) by mouth daily.  Previous Medications   ACETAMINOPHEN (TYLENOL) 325 MG TABLET    Take 325-650 mg by mouth every 4 (four) hours as needed. For pain/fever    ASCORBIC ACID (VITAMIN C) 100 MG TABLET    Take 100 mg by mouth daily.   ASPIRIN EC 81 MG EC TABLET    Take 1 tablet (81 mg total) by mouth daily.   CALCIUM CARBONATE (TUMS - DOSED IN MG ELEMENTAL CALCIUM) 500 MG CHEWABLE TABLET    Chew 1 tablet by mouth 3 (three) times daily.   CLONAZEPAM (KLONOPIN) 0.5 MG TABLET    Take 1 tablet (0.5 mg total) by mouth 2 (two) times daily as needed for anxiety.   ESOMEPRAZOLE (NEXIUM) 40 MG CAPSULE    Take 40 mg by mouth 2 (two) times daily.    FAMOTIDINE (PEPCID) 10 MG TABLET    Take 10 mg by mouth 2 (two) times daily.   FEEDING SUPPLEMENT (ENSURE COMPLETE) LIQD    Take 237 mLs by mouth 2 (two) times daily between meals.   FLAXSEED, LINSEED, 1000 MG CAPS    Take 1 capsule (1,000 mg total) by mouth daily.   FUROSEMIDE (LASIX) 40 MG TABLET    Take 1.5 tablets (60 mg total) by mouth daily.   LORATADINE (CLARITIN) 10 MG TABLET    Take 1 tablet (10 mg total) by mouth daily.   LOSARTAN (COZAAR) 25 MG TABLET    Take 1 tablet (25 mg total) by mouth daily.   METOPROLOL (LOPRESSOR) 100 MG TABLET    Take 1  tablet (100 mg total) by mouth 2 (two) times daily.   MULTIPLE VITAMINS-MINERALS (MULTIVITAMIN) TABLET    Take 1 tablet by mouth daily with breakfast.    RIVAROXABAN (XARELTO) 15 MG TABS TABLET    Take 1 15 mg tablet twice a day until July 4th, at which time you will switch to 20mg  tablet once a day on July 5th   RIVAROXABAN (XARELTO) 20 MG TABS    Take 20 mg by mouth daily.  Modified Medications   No medications on file  Discontinued Medications   FLUCONAZOLE (DIFLUCAN) 400-0.9 MG/200ML-% IVPB    Inject 200 mLs (400 mg total) into the vein daily.    Subjective: [name] is in for his hospital followup visit. He was hospitalized from May 9-24 for evaluation of his FUO. He was found to have candidal esophagitis and his transesophageal echocardiogram revealed a large vegetation on his right atrial lead. Multiple blood cultures were negative. Cultures of the pacemaker lead after it was removed grew Candida albicans sensitive to micafungin and fluconazole. He was initially treated with IV micafungin and then transitioned to IV fluconazole. He had  an extremely complicated course with septic pulmonary embolism of the vegetation causing transient respiratory failure and hypotension. He also commented on blurred vision and was found to have cotton-wool spots. He saw my partner, [name], shortly after discharge and was continuing to do very poorly so he was readmitted to the hospital on May 28. During his hospitalizations he was also treated empirically for healthcare associated pneumonia and UTI. He has now been home for about 10 days and is feeling much better. He has had only one fever since discharge to 100.7 three days ago. His appetite is returning andhe is feeling much stronger. He had 2 brief episodes where he noticed some bleeding from his left upper chest pacemaker pocket, but none in the past few days. His blurred vision has resolved. He has some residual cough that is nonproductive. His  shortness of breath has improved. He has not had any problems tolerating his PICC or fluconazole.  Objective: Temp: 97.6 F (36.4 C) (06/25 1505) Temp src: Oral (06/25 1505) BP: 110/74 mmHg (06/25 1505) Pulse Rate: 91  (06/25 1505)  General: He is smiling and in good spirits. His weight is 175 [name], down from 211 [name] in April. Skin: His right arm PICC site appears normal. Lungs: A few crackles in the right base posteriorly Cor: Regular S1 and S2 with an early 1/6 systolic murmur Abdomen: Nontender    Assessment: This is a most unusual case. He does not have any known risk factors for candidal esophagitis but I presume that is the source for seeding of his pacemaker lead. It is also quite unusual that the Candida albicans never grew in  any of his blood cultures. Fortunately he is doing much better now with the pacemaker out and following a 6 week course of antifungal therapy. I will remove the PICC today and convert him over to oral fluconazole. I have asked him to call and schedule a follow up appointment with his eye doctor, [name] to followup on the cotton-wool spots.  Plan: 1. Remove PICC and change fluconazole to 200 mg by mouth daily 2. Return in 6 weeks   Carlos Asters, MD [facility] for Infectious Disease [facility] [phone] pager   [phone] cell 09/24/2011, 3:29 PM

## 2011-09-24 NOTE — Assessment & Plan Note (Signed)
His symptoms are close to class 3. I have asked him to continue his current meds. I suspect he will improve with BiV reimplant.

## 2011-09-24 NOTE — Assessment & Plan Note (Signed)
His current device is out. I will discuss the timing of re-implantation with ID and GI. I suspect he will need eradication of the candida in his GI tract prior to device re-implantation.
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
## 2011-09-24 NOTE — Patient Instructions (Addendum)
Your physician recommends that you schedule a follow-up appointment in: 3 months with Dr Taylor  

## 2011-09-30 DIAGNOSIS — J189 Pneumonia, unspecified organism: Secondary | ICD-10-CM

## 2011-09-30 DIAGNOSIS — I2699 Other pulmonary embolism without acute cor pulmonale: Secondary | ICD-10-CM

## 2011-09-30 HISTORY — DX: Other pulmonary embolism without acute cor pulmonale: I26.99

## 2011-09-30 HISTORY — DX: Pneumonia, unspecified organism: J18.9

## 2011-10-01 ENCOUNTER — Inpatient Hospital Stay (HOSPITAL_COMMUNITY)
Admission: EM | Admit: 2011-10-01 | Discharge: 2011-10-05 | DRG: 871 | Disposition: A | Discharge status: Home / Self Care | Payer: Medicare Other | Attending: Internal Medicine | Admitting: Internal Medicine

## 2011-10-01 ENCOUNTER — Inpatient Hospital Stay: Payer: Medicare Other | Admitting: Internal Medicine

## 2011-10-01 ENCOUNTER — Emergency Department (HOSPITAL_COMMUNITY): Payer: Medicare Other

## 2011-10-01 ENCOUNTER — Telehealth: Payer: Self-pay

## 2011-10-01 ENCOUNTER — Other Ambulatory Visit: Payer: Self-pay

## 2011-10-01 ENCOUNTER — Encounter (HOSPITAL_COMMUNITY): Payer: Self-pay | Admitting: *Deleted

## 2011-10-01 DIAGNOSIS — Z86711 Personal history of pulmonary embolism: Secondary | ICD-10-CM

## 2011-10-01 DIAGNOSIS — E871 Hypo-osmolality and hyponatremia: Secondary | ICD-10-CM | POA: Diagnosis present

## 2011-10-01 DIAGNOSIS — J189 Pneumonia, unspecified organism: Secondary | ICD-10-CM | POA: Diagnosis present

## 2011-10-01 DIAGNOSIS — B3781 Candidal esophagitis: Secondary | ICD-10-CM | POA: Diagnosis present

## 2011-10-01 DIAGNOSIS — R918 Other nonspecific abnormal finding of lung field: Secondary | ICD-10-CM

## 2011-10-01 DIAGNOSIS — Z888 Allergy status to other drugs, medicaments and biological substances status: Secondary | ICD-10-CM

## 2011-10-01 DIAGNOSIS — J9601 Acute respiratory failure with hypoxia: Secondary | ICD-10-CM

## 2011-10-01 DIAGNOSIS — I252 Old myocardial infarction: Secondary | ICD-10-CM

## 2011-10-01 DIAGNOSIS — B376 Candidal endocarditis: Secondary | ICD-10-CM | POA: Diagnosis present

## 2011-10-01 DIAGNOSIS — I447 Left bundle-branch block, unspecified: Secondary | ICD-10-CM | POA: Diagnosis present

## 2011-10-01 DIAGNOSIS — I2589 Other forms of chronic ischemic heart disease: Secondary | ICD-10-CM | POA: Diagnosis present

## 2011-10-01 DIAGNOSIS — Z7982 Long term (current) use of aspirin: Secondary | ICD-10-CM

## 2011-10-01 DIAGNOSIS — Z79899 Other long term (current) drug therapy: Secondary | ICD-10-CM

## 2011-10-01 DIAGNOSIS — I4729 Other ventricular tachycardia: Secondary | ICD-10-CM | POA: Diagnosis present

## 2011-10-01 DIAGNOSIS — H3581 Retinal edema: Secondary | ICD-10-CM | POA: Diagnosis present

## 2011-10-01 DIAGNOSIS — D649 Anemia, unspecified: Secondary | ICD-10-CM | POA: Diagnosis present

## 2011-10-01 DIAGNOSIS — Z7901 Long term (current) use of anticoagulants: Secondary | ICD-10-CM

## 2011-10-01 DIAGNOSIS — E46 Unspecified protein-calorie malnutrition: Secondary | ICD-10-CM

## 2011-10-01 DIAGNOSIS — I2510 Atherosclerotic heart disease of native coronary artery without angina pectoris: Secondary | ICD-10-CM | POA: Diagnosis present

## 2011-10-01 DIAGNOSIS — E785 Hyperlipidemia, unspecified: Secondary | ICD-10-CM | POA: Diagnosis present

## 2011-10-01 DIAGNOSIS — K219 Gastro-esophageal reflux disease without esophagitis: Secondary | ICD-10-CM | POA: Diagnosis present

## 2011-10-01 DIAGNOSIS — I10 Essential (primary) hypertension: Secondary | ICD-10-CM | POA: Diagnosis present

## 2011-10-01 DIAGNOSIS — K222 Esophageal obstruction: Secondary | ICD-10-CM

## 2011-10-01 DIAGNOSIS — I472 Ventricular tachycardia, unspecified: Secondary | ICD-10-CM | POA: Diagnosis present

## 2011-10-01 DIAGNOSIS — Z951 Presence of aortocoronary bypass graft: Secondary | ICD-10-CM

## 2011-10-01 DIAGNOSIS — I509 Heart failure, unspecified: Secondary | ICD-10-CM | POA: Diagnosis present

## 2011-10-01 DIAGNOSIS — I5022 Chronic systolic (congestive) heart failure: Secondary | ICD-10-CM | POA: Diagnosis present

## 2011-10-01 DIAGNOSIS — R509 Fever, unspecified: Secondary | ICD-10-CM

## 2011-10-01 DIAGNOSIS — Z86718 Personal history of other venous thrombosis and embolism: Secondary | ICD-10-CM

## 2011-10-01 DIAGNOSIS — Z95810 Presence of automatic (implantable) cardiac defibrillator: Secondary | ICD-10-CM

## 2011-10-01 DIAGNOSIS — Z823 Family history of stroke: Secondary | ICD-10-CM

## 2011-10-01 DIAGNOSIS — I82409 Acute embolism and thrombosis of unspecified deep veins of unspecified lower extremity: Secondary | ICD-10-CM | POA: Diagnosis present

## 2011-10-01 DIAGNOSIS — I2690 Septic pulmonary embolism without acute cor pulmonale: Secondary | ICD-10-CM | POA: Diagnosis present

## 2011-10-01 DIAGNOSIS — R042 Hemoptysis: Secondary | ICD-10-CM | POA: Diagnosis present

## 2011-10-01 DIAGNOSIS — A419 Sepsis, unspecified organism: Principal | ICD-10-CM | POA: Diagnosis present

## 2011-10-01 DIAGNOSIS — J310 Chronic rhinitis: Secondary | ICD-10-CM | POA: Diagnosis present

## 2011-10-01 DIAGNOSIS — Z8249 Family history of ischemic heart disease and other diseases of the circulatory system: Secondary | ICD-10-CM

## 2011-10-01 DIAGNOSIS — J90 Pleural effusion, not elsewhere classified: Secondary | ICD-10-CM

## 2011-10-01 DIAGNOSIS — Z87891 Personal history of nicotine dependence: Secondary | ICD-10-CM

## 2011-10-01 LAB — BASIC METABOLIC PANEL
BUN: 13 mg/dL (ref 6–23)
Calcium: 9 mg/dL (ref 8.4–10.5)
Creatinine, Ser: 0.77 mg/dL (ref 0.50–1.35)
GFR calc Af Amer: >90 mL/min (ref >90)
GFR calc non Af Amer: >90 mL/min (ref >90)
Glucose, Bld: 112 mg/dL — ABNORMAL HIGH (ref 70–99)

## 2011-10-01 LAB — CBC
HCT: 28.3 % — ABNORMAL LOW (ref 39.0–52.0)
Hemoglobin: 8.8 g/dL — ABNORMAL LOW (ref 13.0–17.0)
MCH: 24.3 pg — ABNORMAL LOW (ref 26.0–34.0)
MCHC: 31.1 g/dL (ref 30.0–36.0)
RDW: 17.6 % — ABNORMAL HIGH (ref 11.5–15.5)

## 2011-10-01 MED ORDER — FLUCONAZOLE 100 MG PO TABS: 200.0000 mg | ORAL_TABLET | Freq: Every day | ORAL | Status: DC

## 2011-10-01 MED ORDER — IOHEXOL 350 MG/ML SOLN
100.0000 mL | Freq: Once | INTRAVENOUS | Status: AC | PRN
  Administered 2011-10-01: 100 mL via INTRAVENOUS

## 2011-10-01 NOTE — ED Notes (Signed)
Patient currently sitting up in bed; no respiratory or acute distress noted.  Patient updated on plan of care; informed patient that lab will be by to collect specimens; patient has no other questions or concerns at this time; will continue to monitor.

## 2011-10-01 NOTE — ED Notes (Signed)
Family member given diet coke.

## 2011-10-01 NOTE — Telephone Encounter (Signed)
Pt is taking oral fluconazole. His wife will continue to monitor the fevers and called our office if he continues to have problems so we can schedule him for a visit with the physician.    Laurell Josephs, RN

## 2011-10-01 NOTE — Telephone Encounter (Signed)
Wife states fevers have returned since patient stopped diflucan.  Fever daily with highest being 101.1 . Pt has been taking tylenol. He has c/o body pain which has been relieved with percocet.   They are concerned about current symptoms since this is the way the infections initially started.  Please advise.    Laurell Josephs, RN

## 2011-10-01 NOTE — ED Notes (Signed)
Patient currently sitting up in bed; no respiratory or acute distress noted.  Patient updated on plan of care; informed patient that we are currently waiting on CT tech to come and transport patient to CT; patient has no other questions or concerns at this time; will continue to monitor.

## 2011-10-01 NOTE — Addendum Note (Signed)
Addended by: [name] on: 10/01/2011 09:51 AM   Modules accepted: Orders

## 2011-10-01 NOTE — ED Notes (Signed)
Called CT and notified tech that Basic Metabolic Panel has resulted.

## 2011-10-01 NOTE — ED Notes (Signed)
Patient currently resting quietly in bed; no respiratory or acute distress noted. Patient updated on plan of care; informed patient that we are currently waiting on further orders/disposition from EDP.  Patient has no other questions or concerns at this time; will continue to monitor. 

## 2011-10-01 NOTE — ED Notes (Signed)
Per EMS, patient coughing up blood X 1 today since this afternoon. Reported fever of 101.7. Reports being evaluated at [facility] this evening. States he was here about 2-3 weeks ago for pacemaker issues. Patient received 300 mL of fluid; EKG shown left bundle branch block.

## 2011-10-01 NOTE — Telephone Encounter (Signed)
Please make sure that [name] is still taking fluconazole 200 mg by mouth daily. If fevers persist please have him seen by one of the clinic doctors as soon as possible.
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
## 2011-10-01 NOTE — ED Notes (Signed)
Asked phlebotomy about delay in labs; phlebotomy tech states that they are on their way.

## 2011-10-02 ENCOUNTER — Encounter (HOSPITAL_COMMUNITY): Payer: Self-pay | Admitting: Internal Medicine

## 2011-10-02 DIAGNOSIS — B376 Candidal endocarditis: Secondary | ICD-10-CM

## 2011-10-02 DIAGNOSIS — R042 Hemoptysis: Secondary | ICD-10-CM

## 2011-10-02 DIAGNOSIS — I82409 Acute embolism and thrombosis of unspecified deep veins of unspecified lower extremity: Secondary | ICD-10-CM

## 2011-10-02 DIAGNOSIS — R509 Fever, unspecified: Secondary | ICD-10-CM | POA: Diagnosis present

## 2011-10-02 DIAGNOSIS — J9600 Acute respiratory failure, unspecified whether with hypoxia or hypercapnia: Secondary | ICD-10-CM

## 2011-10-02 DIAGNOSIS — J189 Pneumonia, unspecified organism: Secondary | ICD-10-CM | POA: Diagnosis present

## 2011-10-02 LAB — COMPREHENSIVE METABOLIC PANEL
ALT: 10 U/L (ref 0–53)
AST: 19 U/L (ref 0–37)
Alkaline Phosphatase: 84 U/L (ref 39–117)
CO2: 22 mEq/L (ref 19–32)
Calcium: 9.3 mg/dL (ref 8.4–10.5)
Chloride: 95 mEq/L — ABNORMAL LOW (ref 96–112)
GFR calc Af Amer: >90 mL/min (ref >90)
GFR calc non Af Amer: 89 mL/min — ABNORMAL LOW (ref >90)
Glucose, Bld: 131 mg/dL — ABNORMAL HIGH (ref 70–99)
Potassium: 4.1 mEq/L (ref 3.5–5.1)
Sodium: 134 mEq/L — ABNORMAL LOW (ref 135–145)

## 2011-10-02 LAB — CBC
HCT: 27.1 % — ABNORMAL LOW (ref 39.0–52.0)
Hemoglobin: 8.3 g/dL — ABNORMAL LOW (ref 13.0–17.0)
MCH: 24.1 pg — ABNORMAL LOW (ref 26.0–34.0)
MCHC: 30.6 g/dL (ref 30.0–36.0)
MCV: 78.6 fL (ref 78.0–100.0)
Platelets: 274 10*3/uL (ref 150–400)
RBC: 3.45 MIL/uL — ABNORMAL LOW (ref 4.22–5.81)
RDW: 18 % — ABNORMAL HIGH (ref 11.5–15.5)
WBC: 12.8 10*3/uL — ABNORMAL HIGH (ref 4.0–10.5)

## 2011-10-02 LAB — CARDIAC PANEL(CRET KIN+CKTOT+MB+TROPI)
CK, MB: 1.1 ng/mL (ref 0.3–4.0)
CK, MB: 1.1 ng/mL (ref 0.3–4.0)
Relative Index: INVALID (ref 0.0–2.5)
Relative Index: INVALID (ref 0.0–2.5)
Total CK: 11 U/L (ref 7–232)
Total CK: 15 U/L (ref 7–232)
Troponin I: <0.3 ng/mL (ref <0.30)
Troponin I: <0.3 ng/mL (ref <0.30)

## 2011-10-02 LAB — POCT I-STAT 3, ART BLOOD GAS (G3+)
Bicarbonate: 24.4 mEq/L — ABNORMAL HIGH (ref 20.0–24.0)
Patient temperature: 98.6
TCO2: 25 mmol/L (ref 0–100)
pCO2 arterial: 30.3 mmHg — ABNORMAL LOW (ref 35.0–45.0)
pH, Arterial: 7.514 — ABNORMAL HIGH (ref 7.350–7.450)
pO2, Arterial: 73 mmHg — ABNORMAL LOW (ref 80.0–100.0)

## 2011-10-02 LAB — PROTIME-INR: INR: 1.85 — ABNORMAL HIGH (ref 0.00–1.49)

## 2011-10-02 LAB — APTT: aPTT: 44 seconds — ABNORMAL HIGH (ref 24–37)

## 2011-10-02 LAB — LACTIC ACID, PLASMA: Lactic Acid, Venous: 3.1 mmol/L — ABNORMAL HIGH (ref 0.5–2.2)

## 2011-10-02 MED ORDER — ACETAMINOPHEN 325 MG PO TABS
650.0000 mg | ORAL_TABLET | Freq: Four times a day (QID) | ORAL | Status: DC | PRN
  Administered 2011-10-02 – 2011-10-04 (×4): 650 mg via ORAL
  Filled 2011-10-02 (×4): qty 2

## 2011-10-02 MED ORDER — SODIUM CHLORIDE 0.9 % IV SOLN: Freq: Once | INTRAVENOUS | Status: DC

## 2011-10-02 MED ORDER — ONDANSETRON HCL 4 MG PO TABS: 4.0000 mg | ORAL_TABLET | Freq: Four times a day (QID) | ORAL | Status: DC | PRN

## 2011-10-02 MED ORDER — METOPROLOL TARTRATE 25 MG PO TABS
50.0000 mg | ORAL_TABLET | Freq: Once | ORAL | Status: AC
  Administered 2011-10-02: 50 mg via ORAL
  Filled 2011-10-02: qty 2

## 2011-10-02 MED ORDER — SODIUM CHLORIDE 0.9 % IJ SOLN: 3.0000 mL | Freq: Two times a day (BID) | INTRAMUSCULAR | Status: DC

## 2011-10-02 MED ORDER — ENSURE COMPLETE PO LIQD
237.0000 mL | Freq: Two times a day (BID) | ORAL | Status: DC
  Administered 2011-10-02 – 2011-10-04 (×5): 237 mL via ORAL

## 2011-10-02 MED ORDER — HEPARIN (PORCINE) IN NACL 100-0.45 UNIT/ML-% IJ SOLN
1250.0000 [IU]/h | INTRAMUSCULAR | Status: DC
  Administered 2011-10-02: 1250 [IU]/h via INTRAVENOUS
  Filled 2011-10-02: qty 250

## 2011-10-02 MED ORDER — LEVOFLOXACIN IN D5W 500 MG/100ML IV SOLN
500.0000 mg | INTRAVENOUS | Status: DC
  Administered 2011-10-02: 500 mg via INTRAVENOUS
  Filled 2011-10-02 (×2): qty 100

## 2011-10-02 MED ORDER — OXYCODONE-ACETAMINOPHEN 5-325 MG PO TABS
1.0000 | ORAL_TABLET | Freq: Once | ORAL | Status: AC
  Administered 2011-10-02: 1 via ORAL
  Filled 2011-10-02: qty 1

## 2011-10-02 MED ORDER — LORATADINE 10 MG PO TABS
10.0000 mg | ORAL_TABLET | Freq: Every day | ORAL | Status: DC
  Administered 2011-10-02 – 2011-10-05 (×4): 10 mg via ORAL
  Filled 2011-10-02 (×4): qty 1

## 2011-10-02 MED ORDER — VANCOMYCIN HCL IN DEXTROSE 1-5 GM/200ML-% IV SOLN
1000.0000 mg | Freq: Once | INTRAVENOUS | Status: AC
  Administered 2011-10-02: 1000 mg via INTRAVENOUS
  Filled 2011-10-02: qty 200

## 2011-10-02 MED ORDER — HEPARIN (PORCINE) IN NACL 100-0.45 UNIT/ML-% IJ SOLN
1300.0000 [IU]/h | INTRAMUSCULAR | Status: DC
  Administered 2011-10-03: 1300 [IU]/h via INTRAVENOUS
  Filled 2011-10-02 (×2): qty 250

## 2011-10-02 MED ORDER — PIPERACILLIN-TAZOBACTAM 3.375 G IVPB
3.3750 g | Freq: Once | INTRAVENOUS | Status: AC
  Administered 2011-10-02: 3.375 g via INTRAVENOUS
  Filled 2011-10-02: qty 50

## 2011-10-02 MED ORDER — SODIUM CHLORIDE 0.9 % IV SOLN
INTRAVENOUS | Status: DC
  Administered 2011-10-02: 125 mL/h via INTRAVENOUS
  Administered 2011-10-02: 03:00:00 via INTRAVENOUS

## 2011-10-02 MED ORDER — HEPARIN (PORCINE) IN NACL 100-0.45 UNIT/ML-% IJ SOLN
1750.0000 [IU]/h | INTRAMUSCULAR | Status: DC
  Filled 2011-10-02: qty 250

## 2011-10-02 MED ORDER — METOPROLOL TARTRATE 100 MG PO TABS
100.0000 mg | ORAL_TABLET | Freq: Two times a day (BID) | ORAL | Status: DC
  Administered 2011-10-02 – 2011-10-05 (×7): 100 mg via ORAL
  Filled 2011-10-02 (×8): qty 1

## 2011-10-02 MED ORDER — ACETAMINOPHEN 650 MG RE SUPP: 650.0000 mg | Freq: Four times a day (QID) | RECTAL | Status: DC | PRN

## 2011-10-02 MED ORDER — DEXTROSE 5 % IV SOLN
2.0000 g | Freq: Three times a day (TID) | INTRAVENOUS | Status: DC
  Administered 2011-10-02 – 2011-10-04 (×7): 2 g via INTRAVENOUS
  Filled 2011-10-02 (×12): qty 2

## 2011-10-02 MED ORDER — FLUCONAZOLE IN SODIUM CHLORIDE 200-0.9 MG/100ML-% IV SOLN
200.0000 mg | Freq: Every day | INTRAVENOUS | Status: DC
  Administered 2011-10-02: 200 mg via INTRAVENOUS
  Filled 2011-10-02 (×4): qty 100

## 2011-10-02 MED ORDER — CLONAZEPAM 0.5 MG PO TABS
0.5000 mg | ORAL_TABLET | Freq: Two times a day (BID) | ORAL | Status: DC | PRN
  Administered 2011-10-02 – 2011-10-04 (×4): 0.5 mg via ORAL
  Filled 2011-10-02 (×4): qty 1

## 2011-10-02 MED ORDER — FLUCONAZOLE IN SODIUM CHLORIDE 200-0.9 MG/100ML-% IV SOLN
200.0000 mg | Freq: Once | INTRAVENOUS | Status: AC
  Administered 2011-10-02: 200 mg via INTRAVENOUS
  Filled 2011-10-02: qty 100

## 2011-10-02 MED ORDER — VANCOMYCIN HCL 1000 MG IV SOLR
1500.0000 mg | Freq: Two times a day (BID) | INTRAVENOUS | Status: DC
  Administered 2011-10-02 – 2011-10-03 (×4): 1500 mg via INTRAVENOUS
  Filled 2011-10-02 (×7): qty 1500

## 2011-10-02 MED ORDER — ONDANSETRON HCL 4 MG/2ML IJ SOLN: 4.0000 mg | Freq: Four times a day (QID) | INTRAMUSCULAR | Status: DC | PRN

## 2011-10-02 MED ORDER — DEXTROMETHORPHAN POLISTIREX 30 MG/5ML PO LQCR
30.0000 mg | Freq: Three times a day (TID) | ORAL | Status: DC
  Administered 2011-10-02 – 2011-10-03 (×4): 30 mg via ORAL
  Filled 2011-10-02 (×6): qty 5

## 2011-10-02 MED ORDER — LOSARTAN POTASSIUM 25 MG PO TABS
25.0000 mg | ORAL_TABLET | Freq: Every day | ORAL | Status: DC
  Administered 2011-10-02: 25 mg via ORAL
  Filled 2011-10-02: qty 1

## 2011-10-02 NOTE — ED Notes (Signed)
Patient being transported upstairs on portable cardiac monitor with RNs; 3300 called to notify them that patient is on way.

## 2011-10-02 NOTE — ED Notes (Signed)
Noted that patient is coughing up dark red blood in small amounts. No acute distress noted at present. Now awaiting hospital physician to see.
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
## 2011-10-02 NOTE — Progress Notes (Signed)
Name: [name] MRN: [id number] DOB: May 08, 1945    LOS: 1  Referring Provider:  [facility] Reason for Referral:  Sepsis  PULMONARY / CRITICAL CARE MEDICINE  HPI:  This is a 66 y/o male with ischaemic cardiomyopathy and a recent hospitalization for pacer wire candidal infection presented to the [facility] ED on 10/02/2011 from [facility] ED.  He states that he has had one week of myalgias, chills, fever, and cough since changing from IV to po fluconazole.  He also notes that in the last 24 hours he has coughed up one to two tablespoons of blood.  He has not some chest soreness with cough but no other frank chest pain.  He did not take his pm medications this evening.  PCCM is consulted for concern of sepsis.    Brief patient description:  66 y/o male with ischaemic cardiomyopathy and recent candidal esophagitis and pacer wire infection (also candida) presented on 7/3 with sepsis and hemoptysis likely due to HCAP but CT images are worrisome for septic emboli.  DDx also includes infarct or recurrent emboli.  Events Since Admission: 7/2 CT Angio Chest >> Bilateral PE (no change from prior), evolving mass like airspace disease on rt, some wedge shaped opacities and esophagitis  Current Status: No more hemoptysis since that 1 episode, c/o some chest pain with coughing, 'wet' but cannot bring anything up  Vital Signs: Temp:  [98.4 F (36.9 C)-100.9 F (38.3 C)] 98.9 F (37.2 C) (07/03 0713) Pulse Rate:  [92-122] 92  (07/03 0640) Resp:  [18-42] 27  (07/03 0640) BP: (86-121)/(47-75) 117/64 mmHg (07/03 0713) SpO2:  [92 %-100 %] 95 % (07/03 0640) Weight:  [80.5 kg (177 lb 7.5 oz)-107.956 kg (238 lb)] 80.5 kg (177 lb 7.5 oz) (07/03 0245)  Physical Examination: Gen: tachypnic but speaking in full sentences, no accessory muscle use HEENT: NCAT, PERRL, EOMi, OP clear, neck supple without masses PULM: Insp crackles on R lung, clear on left CV: Tachy, no clear murmur, no JVD AB: BS+, soft, nontender, no  hsm Ext: warm, trace edema, no clubbing, no cyanosis Derm: no rash or skin breakdown Neuro: A&Ox4, CN II-XII intact, strength 5/5 in all 4 extremities   Principal Problem:  *Hemoptysis Active Problems:  Chronic systolic congestive heart failure  CAD (coronary artery disease)  Candidal endocarditis  Pulmonary embolism, septic  DVT (deep venous thrombosis)  Fever  HCAP (healthcare-associated pneumonia)   ASSESSMENT AND PLAN  PULMONARY  Lab 10/02/11 0224  PHART 7.514*  PCO2ART 30.3*  PO2ART 73.0*  HCO3 24.4*  O2SAT 96.0   Ventilator Settings:   CXR:  See CT chest above  A:   -Mild respiratory distress from HCAP; not in respiratory failure;  -Hemoptysis likely related to pulmonary infarct vs. Pneumonia while on full dose anticoagulation;  -Pulmonary embolism -Mass like density on rt -favor inflammatory/ infectious etiology -this was not present in 4/13 - Note he had 5 CT chests in last 3 months! P:   -lack of hypoxemia and signs of respiratory failure are reassuring -O2 as needed -hold rivaroxaban for 24h, on heparin GTT for PE -if significant hemoptysis develops, need to stop heparin, give protamine, and arrange for IVC filter -Long term, he prefers rivaroxaban to coumadin -delsym round the clock for cough, note allergy to codeine   CARDIOVASCULAR  Lab 10/02/11 0325 10/02/11 0155  TROPONINI <0.30 --  LATICACIDVEN -- 3.1*  PROBNP -- --   ECG:  Sinus tachycardia with LBBB Lines: peripheral IV  A:  -Sepsis without septic  shock, question metoprolol withdrawal -CHF with ischemic cardiomyopathy -Hypertension (normotensive now) P:  -give gentle IVF now for sepsis -restart metoprolol at half of home dose for now, monitor BP closely -tele -neg enzymes   RENAL  Lab 10/02/11 0505 10/01/11 2202  NA 134* 135  K 4.1 3.6  CL 95* 97  CO2 22 24  BUN 13 13  CREATININE 0.85 0.77  CALCIUM 9.3 9.0  MG -- --  PHOS -- --   Intake/Output      07/02 0701 - 07/03  0700 07/03 0701 - 07/04 0700   I.V. (mL/kg) 350 (4.3)    IV Piggyback 750    Total Intake(mL/kg) 1100 (13.7)    Urine (mL/kg/hr)  425   Total Output  425   Net +1100 -425         Foley:    A:  No acute issues P:   -monitor UOP and BMET closely  GASTROINTESTINAL  Lab 10/02/11 0505  AST 19  ALT 10  ALKPHOS 84  BILITOT 0.4  PROT 7.5  ALBUMIN 2.4*    A:  Candidal esophagitis P:   -diflucan  HEMATOLOGIC  Lab 10/02/11 0505 10/02/11 0325 10/01/11 2202  HGB 8.3* -- 8.8*  HCT 27.1* -- 28.3*  PLT 274 -- 265  INR -- 1.85* --  APTT -- -- --   A:  Anemia P:  -work up per primary team  INFECTIOUS  Lab 10/02/11 0505 10/02/11 0156 10/01/11 2202  WBC 12.8* -- 10.9*  PROCALCITON -- 0.35 --   Cultures: 7/3 blood >> 7/3 urine >>   Antibiotics: 7/3 Vanc (HCAP) >> 7/3 Zosyn (HCAP) >> 7/3 Diflucan IV (pacer wire candidemia) >>   A:  HCAP, question septic emboli to lung (candidal?) vs. candidal endocarditis, low pct reassuring P:   -vanc zosyn -IV diflucan -f/u cultures, would rpt pct, if low rapidly scale down abx -ID consult -TTE   BEST PRACTICE / DISPOSITION DVT Px:  Hep gtt Social / Family:  Wife updated with patient at bedside  OK to transfer to tele in my opinion, PCCm will FU Friday if he is still here.  Oretha Milch., M.D. Pulmonary and Critical Care Medicine [facility] Pager: [phone]  10/02/2011, 9:53 AM

## 2011-10-02 NOTE — Progress Notes (Signed)
ANTICOAGULATION CONSULT NOTE - Follow Up Consult  Pharmacy Consult for Heparin  Indication: pulmonary embolus  Allergies  Allergen Reactions  . Avapro (Irbesartan) Other (See Comments)    Hypotension  . Codeine     unknown  . Crestor (Rosuvastatin Calcium)     unknown  . Lipitor (Atorvastatin Calcium)     unknown  . Lisinopril Cough  . Morphine Other (See Comments)    Severe hallucinations  . Zocor (Simvastatin - High Dose)     unknown   Vital Signs: Temp: 98.4 F (36.9 C) (07/03 2011) Temp src: Oral (07/03 2011) BP: 127/73 mmHg (07/03 2011) Pulse Rate: 97  (07/03 2011)  Labs:  Basename 10/02/11 2004 10/02/11 1050 10/02/11 0505 10/02/11 0325 10/01/11 2202  HGB -- -- 8.3* -- 8.8*  HCT -- -- 27.1* -- 28.3*  PLT -- -- 274 -- 265  APTT 62* 44* -- -- --  LABPROT -- -- -- 21.7* --  INR -- -- -- 1.85* --  HEPARINUNFRC 0.71* -- -- >2.00* --  CREATININE -- -- 0.85 -- 0.77  CKTOTAL -- 15 -- 11 --  CKMB -- 1.1 -- 1.1 --  TROPONINI -- <0.30 -- <0.30 --    Estimated Creatinine Clearance: 89.5 ml/min (by C-G formula based on Cr of 0.85).  Assessment: 65yom on xarelto pta for hx PE (last dose 7/2 am), admitted with hemoptysis, CT angio again shows bilateral PE insetting of recent septic PE/endocarditis. Baseline heparin level and INR elevated (effect of xarelto). Heparin gtt started at 1250 units/hr. Initial aPTT 44  And heparin drip increased to 1300 uts/hr.  Despite continued elevated HL 0.7 d/t xarelto dose, aPTT = 62 which would be within therapeutic range 55-92 (1.5-2.5x upper limit normal range per our lab).  Would expect xarelto interaction with HL to be gone by tomorrow.   No noted bleeding tonight except for admission hemoptysis.  Goal of Therapy:  Heparin level 0.3-0.7 units/ml APTT 55-92 sec Monitor platelets by anticoagulation protocol: Yes   Plan:  1) Cotinue heparin gtt slightly to 1300 units/hr 2) Consider checking just heparin levels thereafter since the  effect of xarelto should be gone  [name] 10/02/2011,10:19 PM
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
## 2011-10-02 NOTE — ED Notes (Signed)
Admitting MD at bedside.

## 2011-10-02 NOTE — Progress Notes (Signed)
Patient being transferrred to 2012 via wheelchair. Phone report called to [facility] . Patient and wife aware of the new room number.

## 2011-10-02 NOTE — Progress Notes (Signed)
[facility] [place] TEAM 1 - Stepdown/ICU TEAM  PCP:  Judge Stall, MD  Subjective: 66 year-old male who was just recently discharged from hospital after being treated for Candida endocarditis, septic pulmonary emboli, DVT of the right lower extremity, history of ischemic cardiomyopathy (last EF measured 25-30%), and CAD status post CABG.  He was on IV Diflucan until a week ago and was changed to oral Diflucan after which patient started developing subjective feeling of fever and chills. Last evening patient coughed up some blood and he went to a local ER. From there he was transferred to [facility].  The patient feels much better today.  He denies any current chest pain.  He has not coughed up any more blood since his admission.  He denies abdominal pain shortness of breath nausea or vomiting.  Objective:  Intake/Output Summary (Last 24 hours) at 10/02/11 1638 Last data filed at 10/02/11 1405  Gross per 24 hour  Intake   1150 ml  Output    425 ml  Net    725 ml   Blood pressure 88/44, pulse 92, temperature 98.7 F (37.1 C), temperature source Oral, resp. rate 27, height 5\' 10"  (1.778 m), weight 80.5 kg (177 lb 7.5 oz), SpO2 95.00%.  Physical Exam: General: No acute respiratory distress at rest Lungs: Faint diffuse crackles and almost all fields with no active wheeze Cardiovascular: Distant heart sounds - regular rate and rhythm without murmur gallop or rub normal S1 and S2 Abdomen: Nontender, nondistended, soft, bowel sounds positive, no rebound, no ascites, no appreciable mass Extremities: No significant cyanosis, clubbing, or edema bilateral lower extremities  Lab Results:  [facility] 10/02/11 0505 10/01/11 2202  NA 134* 135  K 4.1 3.6  CL 95* 97  CO2 22 24  GLUCOSE 131* 112*  BUN 13 13  CREATININE 0.85 0.77  CALCIUM 9.3 9.0  MG -- --  PHOS -- --    Basename 10/02/11 0505  AST 19  ALT 10  ALKPHOS 84  BILITOT 0.4  PROT 7.5  ALBUMIN 2.4*    Basename  10/02/11 0505 10/01/11 2202  WBC 12.8* 10.9*  NEUTROABS -- --  HGB 8.3* 8.8*  HCT 27.1* 28.3*  MCV 78.6 78.2  PLT 274 265    Basename 10/02/11 1050 10/02/11 0325  CKTOTAL 15 11  CKMB 1.1 1.1  CKMBINDEX -- --  TROPONINI <0.30 <0.30   Studies/Results: All recent x-ray/radiology reports have been reviewed in detail.   Medications: I have reviewed the patient's complete medication list.  Assessment/Plan:  Healthcare acquired pneumonia On empiric antibiotic therapy - Levaquin being discontinued by ID  Known Candida endocarditis presenting with new fever We have resumed IV Diflucan - ID directing care  Hemoptysis Likely related to pulmonary infarction versus pneumonia in the setting of anticoagulation - if recurs to a significant extent the patient may require discontinuation of anticoagulation once an IVC filter is placed  Recent history of septic pulmonary emboli and DVT In setting of hemoptysis we are holding the patient's rivaroxoban and treating with IV heparin - should the patient develop clinically significant hemoptysis he will need an IVC filter placed   Recent history of esophageal candidiasis On IV Diflucan - currently able to swallow without major difficulty  Mild hyponatremia Follow trend  Normocytic anemia Most likely primarily due to chronic inflammatory state - follow trend  Ischemic cardiomyopathy with AICD/biventricular pacemaker  Care with volume resuscitation  Coronary artery disease status post coronary artery bypass graft x4 2003 Asymptomatic at present  Hyperlipidemia  History of Hypertension  Disposition Stable for transfer to telemetry bed  Lonia Blood, MD [facility] Office  [phone] Pager [phone]  On-Call/Text Page:      [email or web address]      password [facility]

## 2011-10-02 NOTE — Progress Notes (Signed)
ANTICOAGULATION CONSULT NOTE - Follow Up Consult  Pharmacy Consult for Heparin  Indication: pulmonary embolus  Allergies  Allergen Reactions  . Avapro (Irbesartan) Other (See Comments)    Hypotension  . Codeine     unknown  . Crestor (Rosuvastatin Calcium)     unknown  . Lipitor (Atorvastatin Calcium)     unknown  . Lisinopril Cough  . Morphine Other (See Comments)    Severe hallucinations  . Zocor (Simvastatin - High Dose)     unknown   Vital Signs: Temp: 99.5 F (37.5 C) (07/03 1132) Temp src: Oral (07/03 1132) BP: 88/44 mmHg (07/03 1132) Pulse Rate: 92  (07/03 0640)  Labs:  Basename 10/02/11 1050 10/02/11 0505 10/02/11 0325 10/01/11 2202  HGB -- 8.3* -- 8.8*  HCT -- 27.1* -- 28.3*  PLT -- 274 -- 265  APTT 44* -- -- --  LABPROT -- -- 21.7* --  INR -- -- 1.85* --  HEPARINUNFRC -- -- >2.00* --  CREATININE -- 0.85 -- 0.77  CKTOTAL 15 -- 11 --  CKMB 1.1 -- 1.1 --  TROPONINI <0.30 -- <0.30 --    Estimated Creatinine Clearance: 89.5 ml/min (by C-G formula based on Cr of 0.85).  Medications:  Heparin @ 1250 units/hr  Assessment: 65yom on xarelto pta for hx PE (last dose 7/2 am), admitted with hemoptysis, CT angio again shows bilateral PE. Baseline heparin level and INR elevated (effect of xarelto). Heparin gtt started at 1250 units/hr. Initial aPTT 44 which corresponds to heparin level ~ 0.26. No further hemoptysis per patient. Hgb low but stable and platelets ok.  Goal of Therapy:  Heparin level 0.3-0.7 units/ml APTT 50-85s Monitor platelets by anticoagulation protocol: Yes   Plan:  1) Increase heparin gtt slightly to 1300 units/hr 2) Check 6h aPTT and heparin level to see if they correlate 3) Consider checking just heparin levels thereafter since the effect of xarelto should be gone  [name] 10/02/2011,12:31 PM

## 2011-10-02 NOTE — ED Notes (Signed)
Dr. Toniann Fail at beside.
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
## 2011-10-02 NOTE — ED Notes (Signed)
Patient currently sitting up in bed; no respiratory or acute distress noted.  Patient updated on plan of care; informed patient that we are currently waiting on CT results.  Patient has no other questions or concerns at this time; will continue to monitor.
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
## 2011-10-03 DIAGNOSIS — R042 Hemoptysis: Secondary | ICD-10-CM

## 2011-10-03 DIAGNOSIS — I5022 Chronic systolic (congestive) heart failure: Secondary | ICD-10-CM

## 2011-10-03 DIAGNOSIS — J189 Pneumonia, unspecified organism: Secondary | ICD-10-CM

## 2011-10-03 DIAGNOSIS — I2690 Septic pulmonary embolism without acute cor pulmonale: Secondary | ICD-10-CM

## 2011-10-03 DIAGNOSIS — I509 Heart failure, unspecified: Secondary | ICD-10-CM

## 2011-10-03 LAB — CBC
Hemoglobin: 8 g/dL — ABNORMAL LOW (ref 13.0–17.0)
MCH: 24 pg — ABNORMAL LOW (ref 26.0–34.0)
MCHC: 30.1 g/dL (ref 30.0–36.0)
MCV: 79.6 fL (ref 78.0–100.0)
RBC: 3.34 MIL/uL — ABNORMAL LOW (ref 4.22–5.81)

## 2011-10-03 LAB — BASIC METABOLIC PANEL
BUN: 10 mg/dL (ref 6–23)
CO2: 22 mEq/L (ref 19–32)
Glucose, Bld: 92 mg/dL (ref 70–99)
Potassium: 3.9 mEq/L (ref 3.5–5.1)
Sodium: 136 mEq/L (ref 135–145)

## 2011-10-03 LAB — PROCALCITONIN: Procalcitonin: 0.38 ng/mL

## 2011-10-03 MED ORDER — THERA VITAL M PO TABS: 1.0000 | ORAL_TABLET | Freq: Every day | ORAL | Status: DC

## 2011-10-03 MED ORDER — VANCOMYCIN HCL 1000 MG IV SOLR
1500.0000 mg | Freq: Two times a day (BID) | INTRAVENOUS | Status: DC
  Administered 2011-10-04: 1500 mg via INTRAVENOUS
  Filled 2011-10-03 (×2): qty 1500

## 2011-10-03 MED ORDER — FAMOTIDINE 10 MG PO TABS
10.0000 mg | ORAL_TABLET | Freq: Two times a day (BID) | ORAL | Status: DC
  Administered 2011-10-03 – 2011-10-05 (×5): 10 mg via ORAL
  Filled 2011-10-03 (×6): qty 1

## 2011-10-03 MED ORDER — RIVAROXABAN 20 MG PO TABS
20.0000 mg | ORAL_TABLET | Freq: Every day | ORAL | Status: DC
  Administered 2011-10-03 – 2011-10-05 (×3): 20 mg via ORAL
  Filled 2011-10-03 (×3): qty 1

## 2011-10-03 MED ORDER — HEPARIN (PORCINE) IN NACL 100-0.45 UNIT/ML-% IJ SOLN
1300.0000 [IU]/h | INTRAMUSCULAR | Status: DC
  Filled 2011-10-03: qty 250

## 2011-10-03 MED ORDER — FUROSEMIDE 40 MG PO TABS
40.0000 mg | ORAL_TABLET | Freq: Every day | ORAL | Status: DC
  Administered 2011-10-03 – 2011-10-05 (×3): 40 mg via ORAL
  Filled 2011-10-03 (×3): qty 1

## 2011-10-03 MED ORDER — PANTOPRAZOLE SODIUM 40 MG PO TBEC
80.0000 mg | DELAYED_RELEASE_TABLET | Freq: Every day | ORAL | Status: DC
  Administered 2011-10-03 – 2011-10-05 (×3): 80 mg via ORAL
  Filled 2011-10-03: qty 1
  Filled 2011-10-03 (×2): qty 2

## 2011-10-03 MED ORDER — FLUCONAZOLE 200 MG PO TABS
400.0000 mg | ORAL_TABLET | Freq: Every day | ORAL | Status: DC
  Administered 2011-10-03 – 2011-10-05 (×3): 400 mg via ORAL
  Filled 2011-10-03 (×3): qty 2

## 2011-10-03 MED ORDER — CALCIUM CARBONATE ANTACID 500 MG PO CHEW
1.0000 | CHEWABLE_TABLET | Freq: Three times a day (TID) | ORAL | Status: DC
  Administered 2011-10-03 – 2011-10-05 (×7): 200 mg via ORAL
  Filled 2011-10-03 (×10): qty 1

## 2011-10-03 MED ORDER — ADULT MULTIVITAMIN W/MINERALS CH
1.0000 | ORAL_TABLET | Freq: Every day | ORAL | Status: DC
  Administered 2011-10-04 – 2011-10-05 (×2): 1 via ORAL
  Filled 2011-10-03 (×3): qty 1

## 2011-10-03 NOTE — Progress Notes (Signed)
Patient ID: [name], male   DOB: [birth year], 66 y.o.   MRN: [id number]    [facility]    Date of Admission:  10/01/2011           Week 8 antifungal therapy        Day 2 vancomycin        Day 2 cefepime Principal Problem:  *HCAP (healthcare-associated pneumonia) Active Problems:  Normocytic anemia  Candidal endocarditis  Fever  Hemoptysis  Chronic systolic congestive heart failure  LBBB  ICD - IN SITU  CAD (coronary artery disease)  Esophageal stricture  Cotton wool spots  Malnutrition  V-tach  Pulmonary embolism, septic  DVT (deep venous thrombosis)      . calcium carbonate  1 tablet Oral TID  . ceFEPime (MAXIPIME) IV  2 g Intravenous Q8H  . famotidine  10 mg Oral BID  . feeding supplement  237 mL Oral BID BM  . fluconazole (DIFLUCAN) IV  200 mg Intravenous QHS  . furosemide  40 mg Oral Daily  . loratadine  10 mg Oral Daily  . metoprolol  100 mg Oral BID  . multivitamin with minerals  1 tablet Oral QAC breakfast  . pantoprazole  80 mg Oral Q1200  . rivaroxaban  20 mg Oral Daily  . vancomycin  1,500 mg Intravenous Q12H  . DISCONTD: sodium chloride   Intravenous Once  . DISCONTD: dextromethorphan  30 mg Oral TID  . DISCONTD: levofloxacin (LEVAQUIN) IV  500 mg Intravenous Q24H  . DISCONTD: losartan  25 mg Oral Daily  . DISCONTD: multivitamin  1 tablet Oral Q breakfast  . DISCONTD: sodium chloride  3 mL Intravenous Q12H    Subjective: He is feeling much better. He still has some cough but it is nonproductive. He has not had any further hemoptysis. He denies any shortness of breath.  Objective: Temp:  [97.9 F (36.6 C)-98.7 F (37.1 C)] 97.9 F (36.6 C) (07/04 1409) Pulse Rate:  [89-99] 89  (07/04 1409) Resp:  [18-21] 18  (07/04 1409) BP: (110-127)/(62-76) 118/72 mmHg (07/04 1409) SpO2:  [98 %-100 %] 98 % (07/04 1409) Weight:  [81.1 kg (178 lb 12.7 oz)] 81.1 kg (178 lb 12.7 oz) (07/04 0424)  General: He is in good spirits  watching TV with his wife Skin: No rash Lungs: Few crackles and end expiratory wheezes on the right Cor: Regular S1 and S2 with no murmurs Abdomen: Nontender   Microbiology: Recent Results (from the past 240 hour(s))  CULTURE, BLOOD (ROUTINE X 2)     Status: Normal (Preliminary result)   Collection Time   10/01/11  9:29 PM      Component Value Range Status Comment   Specimen Description BLOOD RIGHT ARM   Final    Special Requests BOTTLES DRAWN AEROBIC AND ANAEROBIC 10CC   Final    Culture  Setup Time 10/02/2011 04:22   Final    Culture     Final    Value:        BLOOD CULTURE RECEIVED NO GROWTH TO DATE CULTURE WILL BE HELD FOR 5 DAYS BEFORE ISSUING A FINAL NEGATIVE REPORT   Report Status PENDING   Incomplete   CULTURE, BLOOD (ROUTINE X 2)     Status: Normal (Preliminary result)   Collection Time   10/01/11  9:34 PM      Component Value Range Status Comment   Specimen Description BLOOD RIGHT HAND   Final    Special Requests BOTTLES DRAWN  AEROBIC AND ANAEROBIC 10CC   Final    Culture  Setup Time 10/02/2011 04:22   Final    Culture     Final    Value:        BLOOD CULTURE RECEIVED NO GROWTH TO DATE CULTURE WILL BE HELD FOR 5 DAYS BEFORE ISSUING A FINAL NEGATIVE REPORT   Report Status PENDING   Incomplete     Studies/Results: Ct Angio Chest W/cm &/or Wo Cm  10/02/2011  *RADIOLOGY REPORT*  Clinical Data: Hemoptysis, history of pulmonary emboli.  CT ANGIOGRAPHY CHEST  Technique:  Multidetector CT imaging of the chest using the standard protocol during bolus administration of intravenous contrast. Multiplanar reconstructed images including MIPs were obtained and reviewed to evaluate the vascular anatomy.  Contrast: OMNIPAQUE IOHEXOL 350 MG/ML SOLN  Comparison: 09/10/2011 radiograph, 09/01/2011 CT  Findings: Large filling defect within the right lower lobe pulmonary arterial branches again noted. Embolus within the left upper lobe/lingular branch again noted.  Peripheral left lower lobe  branches are poorly evaluated due to respiratory motion and streak artifact from contact with the gantry on the right.  Normal caliber aorta.  Status post median sternotomy and CABG. Heart size upper normal.  Small right pleural effusion.  Prominent mediastinal and right hilar lymph nodes again noted.  As index, a right paratracheal lymph node measures 1.1 cm short axis.  Limited images through the upper abdomen demonstrate no acute change.  Central airways are patent. Subpleural bullous changes along the right hemithorax are similar to priors.  There is been some interval clearing of the right lower lobe consolidation however the residual demonstrates a multi focal rounded/mass-like configuration.  On the left, there are multiple peripheral wedge shaped opacities, some of which are smaller however some of which have developed in the interval.  No definitive pneumothorax.  Circumferential esophageal wall thickening.  Multilevel degenerative changes of the imaged spine. No acute or aggressive appearing osseous lesion.  IMPRESSION: Bilateral pulmonary emboli again noted.  Evolving airspace opacities, demonstrating a more multi focal mass- like configuration on the right. May reflect a combination of infarct and infection.  Left-sided wedge-shaped opacities, some of which are smaller however some which have developed in the interval.  Circumferential esophageal wall thickening may reflect esophagitis.  Discussed via telephone with the ER physician at 11:55 p.m. on 10/01/2011.  Original Report Authenticated By: Waneta Martins, M.D.    Assessment: He has defervesced and his transient hemoptysis has resolved coincident with being placed back on IV fluconazole and having broad empiric antibacterial started for possible healthcare associated pneumonia. No sputum are available for stain or culture which limits my ability to make an exact diagnosis. He is going to need ongoing therapy for his candidal infection so I  will change him from IV fluconazole to a higher dose oral fluconazole. I would recommend several more days of therapy for possible bacterial healthcare associated pneumonia.  Plan: 1. Change fluconazole to 400 mg by mouth daily 2. Continue vancomycin and cefepime for now  [name], MD [facility] [phone] pager   [phone] cell 10/03/2011, 2:55 PM

## 2011-10-03 NOTE — Progress Notes (Signed)
Nurse checked patient's temp and observed and elevation of 99.6. Nurse administered 2 Tylenol. Will check temp in 1 hr. Harmon Pier

## 2011-10-03 NOTE — Progress Notes (Signed)
PCP:  Judge Stall, MD  Principal Problem:  *HCAP (healthcare-associated pneumonia) Active Problems:  Chronic systolic congestive heart failure  LBBB  ICD - IN SITU  CAD (coronary artery disease)  Normocytic anemia  Esophageal stricture  Candidal endocarditis  Cotton wool spots  Malnutrition  V-tach  Pulmonary embolism, septic  DVT (deep venous thrombosis)  Fever  Hemoptysis    Assessment/Plan:  Healthcare acquired pneumonia On empiric antibiotic therapy - per ID recommendations  Cefepime 2g iv q 8 hrs Vancomycin per pharmacy    Known Candida endocarditis presenting with new fever Resumed IV Diflucan - ID directing care  Hemoptysis Likely related to pulmonary infarction versus pneumonia in the setting of anticoagulation - if recurs to a significant extent the patient may require discontinuation of anticoagulation and IVC filter is placed. No recurrence in house so far -  Plan to change heaprin to Xarelto 10/03/11  Recent history of septic pulmonary emboli and DVT In setting of hemoptysis we initially held rivaroxoban and treated the patient with IV heparin. Heparin stopped 7/4 and Xarelto restarted 7/4  Recent history of esophageal candidiasis On IV Diflucan - currently able to swallow without major difficulty  Mild hyponatremia Resolved   Normocytic anemia Most likely primarily due to chronic inflammatory state - follow trend  Chronic systolic CHF - continued on BB in house initally we held lasix. Resumed lasix 10/03/11  Ischemic cardiomyopathy with AICD/biventricular pacemaker    Care with volume resuscitation  Coronary artery disease status post coronary artery bypass graft x4 2003 Asymptomatic at present  Hyperlipidemia  History of Hypertension  Narrative: 66 year-old male who was just recently discharged from hospital after being treated for Candida endocarditis, septic pulmonary emboli, DVT of the right lower extremity, history of ischemic  cardiomyopathy (last EF measured 25-30%), and CAD status post CABG.  He was on IV Diflucan until a week ago and was changed to oral Diflucan after which patient started developing subjective feeling of fever and chills. Last evening patient coughed up some blood and he went to a local ER. From there he was transferred to [facility].  Subjective: No complains  Objective:  Intake/Output Summary (Last 24 hours) at 10/03/11 1140 Last data filed at 10/03/11 0600  Gross per 24 hour  Intake 1812.37 ml  Output    550 ml  Net 1262.37 ml   Blood pressure 110/62, pulse 92, temperature 98.5 F (36.9 C), temperature source Oral, resp. rate 20, height 5\' 10"  (1.778 m), weight 81.1 kg (178 lb 12.7 oz), SpO2 98.00%. Patient Vitals for the past 24 hrs:  BP Temp Temp src Pulse Resp SpO2 Weight  10/03/11 1041 110/62 mmHg - - - - - -  10/03/11 0424 111/63 mmHg 98.5 F (36.9 C) Oral 92  20  98 % 81.1 kg (178 lb 12.7 oz)  10/02/11 2011 127/73 mmHg 98.4 F (36.9 C) Oral 97  20  100 % -  10/02/11 1858 127/76 mmHg 98.1 F (36.7 C) Oral 99  21  99 % -  10/02/11 1620 114/63 mmHg 98.7 F (37.1 C) Oral - - - -    Physical Exam: axox3 CVS: rrr, click, no murmur Lungs bilat crackles extremities - edema  Lab Results:  Basename 10/03/11 0609 10/02/11 0505 10/01/11 2202  NA 136 134* 135  K 3.9 4.1 3.6  CL 104 95* 97  CO2 22 22 24   GLUCOSE 92 131* 112*  BUN 10 13 13   CREATININE 0.71 0.85 0.77  CALCIUM 9.2 9.3 9.0  MG -- -- --  PHOS -- -- --    Basename 10/02/11 0505  AST 19  ALT 10  ALKPHOS 84  BILITOT 0.4  PROT 7.5  ALBUMIN 2.4*    Basename 10/03/11 0609 10/02/11 0505 10/01/11 2202  WBC 6.6 12.8* 10.9*  NEUTROABS -- -- --  HGB 8.0* 8.3* 8.8*  HCT 26.6* 27.1* 28.3*  MCV 79.6 78.6 78.2  PLT 241 274 265    Basename 10/02/11 1050 10/02/11 0325  CKTOTAL 15 11  CKMB 1.1 1.1  CKMBINDEX -- --  TROPONINI <0.30 <0.30   Studies/Results: All recent x-ray/radiology reports have been  reviewed in detail.   Medications:    . calcium carbonate  1 tablet Oral TID  . ceFEPime (MAXIPIME) IV  2 g Intravenous Q8H  . famotidine  10 mg Oral BID  . feeding supplement  237 mL Oral BID BM  . fluconazole (DIFLUCAN) IV  200 mg Intravenous QHS  . furosemide  40 mg Oral Daily  . loratadine  10 mg Oral Daily  . metoprolol  100 mg Oral BID  . multivitamin with minerals  1 tablet Oral QAC breakfast  . pantoprazole  80 mg Oral Q1200  . rivaroxaban  20 mg Oral Daily  . vancomycin  1,500 mg Intravenous Q12H  . DISCONTD: sodium chloride   Intravenous Once  . DISCONTD: dextromethorphan  30 mg Oral TID  . DISCONTD: levofloxacin (LEVAQUIN) IV  500 mg Intravenous Q24H  . DISCONTD: losartan  25 mg Oral Daily  . DISCONTD: multivitamin  1 tablet Oral Q breakfast  . DISCONTD: sodium chloride  3 mL Intravenous Q12H     Christine Morton [phone]  On-Call/Text Page:      [email or web address]      password [facility]

## 2011-10-03 NOTE — Progress Notes (Signed)
Nurse rechecked patient's temp after Tylenol administration, and observed a decrease in temp to 98.6. Harmon Pier

## 2011-10-03 NOTE — Progress Notes (Signed)
ANTICOAGULATION CONSULT NOTE - Follow Up Consult  Pharmacy Consult for Heparin  Indication: pulmonary embolus  Allergies  Allergen Reactions  . Avapro (Irbesartan) Other (See Comments)    Hypotension  . Codeine     unknown  . Crestor (Rosuvastatin Calcium)     unknown  . Lipitor (Atorvastatin Calcium)     unknown  . Lisinopril Cough  . Morphine Other (See Comments)    Severe hallucinations  . Zocor (Simvastatin - High Dose)     unknown   Vital Signs: Temp: 98.5 F (36.9 C) (07/04 0424) Temp src: Oral (07/04 0424) BP: 111/63 mmHg (07/04 0424) Pulse Rate: 92  (07/04 0424)  Labs:  Alvira Philips 10/03/11 0609 10/02/11 2004 10/02/11 1050 10/02/11 0505 10/02/11 0325 10/01/11 2202  HGB 8.0* -- -- 8.3* -- --  HCT 26.6* -- -- 27.1* -- 28.3*  PLT 241 -- -- 274 -- 265  APTT -- 62* 44* -- -- --  LABPROT -- -- -- -- 21.7* --  INR -- -- -- -- 1.85* --  HEPARINUNFRC 0.50 0.71* -- -- >2.00* --  CREATININE 0.71 -- -- 0.85 -- 0.77  CKTOTAL -- -- 15 -- 11 --  CKMB -- -- 1.1 -- 1.1 --  TROPONINI -- -- <0.30 -- <0.30 --    Estimated Creatinine Clearance: 95.1 ml/min (by C-G formula based on Cr of 0.71).  Assessment: 65yom on xarelto pta for hx PE (last dose 7/2 am), admitted with hemoptysis, CT angio again shows bilateral PE in setting of recent septic PE/endocarditis. Baseline heparin level and INR elevated (effect of xarelto). Heparin level this a.m. 0.5 (therapeutic) - appears that Xarelto effect is gone.   No noted bleeding except for admission hemoptysis. Hgb remains low but relatively stable.  Goal of Therapy:  Heparin level 0.3-0.7 units/ml Monitor platelets by anticoagulation protocol: Yes   Plan:  1) Cotinue heparin gtt at 1300 units/hr 2) F/u daily heparin level and CBC  Christoper Fabian, PharmD, BCPS Clinical pharmacist, pager [phone] 10/03/2011,10:22 AM

## 2011-10-04 ENCOUNTER — Encounter: Payer: Self-pay | Admitting: Internal Medicine

## 2011-10-04 DIAGNOSIS — I2510 Atherosclerotic heart disease of native coronary artery without angina pectoris: Secondary | ICD-10-CM

## 2011-10-04 LAB — BASIC METABOLIC PANEL
Chloride: 103 mEq/L (ref 96–112)
GFR calc Af Amer: >90 mL/min (ref >90)
GFR calc non Af Amer: >90 mL/min (ref >90)
Potassium: 3.5 mEq/L (ref 3.5–5.1)
Sodium: 139 mEq/L (ref 135–145)

## 2011-10-04 LAB — CBC
HCT: 26.8 % — ABNORMAL LOW (ref 39.0–52.0)
Hemoglobin: 8 g/dL — ABNORMAL LOW (ref 13.0–17.0)
RBC: 3.41 MIL/uL — ABNORMAL LOW (ref 4.22–5.81)
WBC: 6.5 10*3/uL (ref 4.0–10.5)

## 2011-10-04 MED ORDER — DEXTROMETHORPHAN POLISTIREX 30 MG/5ML PO LQCR
5.0000 mL | Freq: Two times a day (BID) | ORAL | Status: DC
  Administered 2011-10-04 – 2011-10-05 (×3): 30 mg via ORAL
  Filled 2011-10-04 (×4): qty 5

## 2011-10-04 MED ORDER — LEVOFLOXACIN 500 MG PO TABS
500.0000 mg | ORAL_TABLET | Freq: Every day | ORAL | Status: DC
  Administered 2011-10-04 – 2011-10-05 (×2): 500 mg via ORAL
  Filled 2011-10-04 (×2): qty 1

## 2011-10-04 MED ORDER — DEXTROMETHORPHAN POLISTIREX 30 MG/5ML PO LQCR
15.0000 mg | Freq: Two times a day (BID) | ORAL | Status: DC | PRN
  Administered 2011-10-05: 15 mg via ORAL
  Filled 2011-10-04: qty 5

## 2011-10-04 NOTE — Progress Notes (Signed)
Name: [name] MRN: [id number] DOB: [birth year]    LOS: 3  Referring Provider:  [facility] Reason for Referral:  Sepsis  PULMONARY / CRITICAL CARE MEDICINE  HPI:  This is a 66 y/o male with ischaemic cardiomyopathy and a recent hospitalization for pacer wire candidal infection presented to the [facility] ED on 10/02/2011 from [facility] ED.  He states that he has had one week of myalgias, chills, fever, and cough since changing from IV to po fluconazole.  He also notes that in the last 24 hours he has coughed up one to two tablespoons of blood.  He has not some chest soreness with cough but no other frank chest pain.  [name]  PCCM is consulted for concern of sepsis.    Brief patient description:  66 y/o male with ischaemic cardiomyopathy and recent candidal esophagitis and pacer wire infection (also candida) presented on 7/3 with sepsis and hemoptysis likely due to HCAP but CT images are worrisome for septic emboli.  DDx also includes infarct or recurrent emboli.  Events Since Admission: 7/2 CT Angio Chest >> Bilateral PE (no change from prior), evolving mass like airspace disease on rt, some wedge shaped opacities and esophagitis  Current Status: No more hemoptysis since that 1 episode, c/o some chest pain with coughing, 'wet' but cannot bring anything up, delsym helped but was stopped for some reason afebrile  Vital Signs: Temp:  [97.9 F (36.6 C)-99.2 F (37.3 C)] 98.7 F (37.1 C) (07/05 0415) Pulse Rate:  [89-101] 94  (07/05 0415) Resp:  [18] 18  (07/05 0415) BP: (118-128)/(72-73) 128/73 mmHg (07/05 0415) SpO2:  [94 %-98 %] 94 % (07/05 0415) Weight:  [82.3 kg (181 lb 7 oz)] 82.3 kg (181 lb 7 oz) (07/05 0415)  Physical Examination: Gen: tachypnic but speaking in full sentences, no accessory muscle use HEENT: NCAT, PERRL, EOMi, OP clear, neck supple without masses PULM: Insp crackles on R lung, clear on left CV: Tachy, no clear murmur, no JVD AB: BS+, soft, nontender, no hsm Ext: warm, trace  edema, no clubbing, no cyanosis Derm: no rash or skin breakdown Neuro: A&Ox4, CN II-XII intact, strength 5/5 in all 4 extremities   Principal Problem:  *HCAP (healthcare-associated pneumonia) Active Problems:  Chronic systolic congestive heart failure  LBBB  ICD - IN SITU  CAD (coronary artery disease)  Normocytic anemia  Esophageal stricture  Candidal endocarditis  Cotton wool spots  Malnutrition  V-tach  Pulmonary embolism, septic  DVT (deep venous thrombosis)  Fever  Hemoptysis   ASSESSMENT AND PLAN  PULMONARY  Lab 10/02/11 0224  PHART 7.514*  PCO2ART 30.3*  PO2ART 73.0*  HCO3 24.4*  O2SAT 96.0   Ventilator Settings:   CXR:  See CT chest above  A:   -Hemoptysis likely related to pulmonary infarct vs. Pneumonia while on full dose anticoagulation;  -Pulmonary embolism -Mass like density on rt -favor inflammatory/ infectious etiology -this was not present in 4/13 - Note he had 5 CT chests in last 3 months! P:   -lack of hypoxemia and signs of respiratory failure are reassuring -resumed rivaroxaban  -Long term, he prefers rivaroxaban to coumadin -ct delsym bid  for cough, note allergy to codeine   CARDIOVASCULAR  Lab 10/02/11 1050 10/02/11 0325 10/02/11 0155  TROPONINI <0.30 <0.30 --  LATICACIDVEN -- -- 3.1*  PROBNP -- -- --   ECG:  Sinus tachycardia with LBBB Lines: peripheral IV  A:  -Sepsis without septic shock, question metoprolol withdrawal -CHF with ischemic cardiomyopathy -Hypertension (normotensive  now) P:  --back on metoprolol  -tele -neg enzymes  GASTROINTESTINAL  Lab 10/02/11 0505  AST 19  ALT 10  ALKPHOS 84  BILITOT 0.4  PROT 7.5  ALBUMIN 2.4*    A:  Candidal esophagitis P:   -diflucan  HEMATOLOGIC  Lab 10/04/11 0500 10/03/11 0609 10/02/11 2004 10/02/11 1050 10/02/11 0505 10/02/11 0325 10/01/11 2202  HGB 8.0* 8.0* -- -- 8.3* -- 8.8*  HCT 26.8* 26.6* -- -- 27.1* -- 28.3*  PLT 253 241 -- -- 274 -- 265  INR -- -- -- --  -- 1.85* --  APTT -- -- 62* 44* -- -- --   A:  Anemia P:  -work up per primary team  INFECTIOUS  Lab 10/04/11 0500 10/03/11 0609 10/02/11 0505 10/02/11 0156 10/01/11 2202  WBC 6.5 6.6 12.8* -- 10.9*  PROCALCITON -- 0.38 -- 0.35 --   Cultures: 7/3 blood >>ng   Antibiotics: 7/3 Vanc (HCAP) >> 7/3 Zosyn (HCAP) >>cefepime >> 7/3 Diflucan IV (pacer wire candidemia) >>   A:  HCAP, question septic emboli to lung (candidal?) vs. candidal endocarditis, low pct reassuring P:   -vanc /cefepime -PO diflucan -f/u cultures, rpt pct low - doubt this is real HCAP - can treat with PO abx in my opinion for remainder of course    BEST PRACTICE / DISPOSITION DVT Px:  Hep gtt Social / Family:  Wife updated with patient at bedside   PCCM to sign off , he preefrs to FU with PCP in [place], FU CXR in 4-6 wks for improvement of infx  Oretha Milch., M.D. Pulmonary and Critical Care Medicine [facility] Pager: [phone]  10/04/2011, 10:53 AM

## 2011-10-04 NOTE — Progress Notes (Signed)
Patient ID: [name], male   DOB: [birth year], 66 y.o.   MRN: [id number]    [facility]    Date of Admission:  10/01/2011           Week 8 antifungal therapy        Day 3 vancomycin        Day 3 cefepime Principal Problem:  *HCAP (healthcare-associated pneumonia) Active Problems:  Normocytic anemia  Candidal endocarditis  Fever  Hemoptysis  Chronic systolic congestive heart failure  LBBB  ICD - IN SITU  CAD (coronary artery disease)  Esophageal stricture  Cotton wool spots  Malnutrition  V-tach  Pulmonary embolism, septic  DVT (deep venous thrombosis)      . calcium carbonate  1 tablet Oral TID  . ceFEPime (MAXIPIME) IV  2 g Intravenous Q8H  . dextromethorphan  5 mL Oral BID  . famotidine  10 mg Oral BID  . feeding supplement  237 mL Oral BID BM  . fluconazole  400 mg Oral Daily  . furosemide  40 mg Oral Daily  . loratadine  10 mg Oral Daily  . metoprolol  100 mg Oral BID  . multivitamin with minerals  1 tablet Oral QAC breakfast  . pantoprazole  80 mg Oral Q1200  . rivaroxaban  20 mg Oral Daily  . vancomycin  1,500 mg Intravenous Q12H  . DISCONTD: vancomycin  1,500 mg Intravenous Q12H    Subjective: He is feeling much better. He has had no more hemoptysis and his cough has improved. He walked in the hallway today without any significant shortness of breath.  Objective: Temp:  [98.5 F (36.9 C)-99.2 F (37.3 C)] 98.5 F (36.9 C) (07/05 1334) Pulse Rate:  [82-101] 82  (07/05 1334) Resp:  [18] 18  (07/05 1334) BP: (122-128)/(69-73) 122/69 mmHg (07/05 1334) SpO2:  [94 %-98 %] 98 % (07/05 1334) Weight:  [82.3 kg (181 lb 7 oz)] 82.3 kg (181 lb 7 oz) (07/05 0415)  General: He is in good spirits, joking with his family Skin: No rash Lungs: Stable decreased breath sounds in right base with a few crackles Cor: Regular S1 and S2 no murmurs  Lab Results Lab Results  Component Value Date   WBC 6.5 10/04/2011   HGB 8.0* 10/04/2011   HCT 26.8* 10/04/2011   MCV 78.6 10/04/2011   PLT 253 10/04/2011    Lab Results  Component Value Date   CREATININE 0.68 10/04/2011   BUN 7 10/04/2011   NA 139 10/04/2011   K 3.5 10/04/2011   CL 103 10/04/2011   CO2 24 10/04/2011    Lab Results  Component Value Date   ALT 10 10/02/2011   AST 19 10/02/2011   ALKPHOS 84 10/02/2011   BILITOT 0.4 10/02/2011      Microbiology: Recent Results (from the past 240 hour(s))  CULTURE, BLOOD (ROUTINE X 2)     Status: Normal (Preliminary result)   Collection Time   10/01/11  9:29 PM      Component Value Range Status Comment   Specimen Description BLOOD RIGHT ARM   Final    Special Requests BOTTLES DRAWN AEROBIC AND ANAEROBIC 10CC   Final    Culture  Setup Time 10/02/2011 04:22   Final    Culture     Final    Value:        BLOOD CULTURE RECEIVED NO GROWTH TO DATE CULTURE WILL BE HELD FOR 5 DAYS BEFORE ISSUING A FINAL NEGATIVE REPORT  Report Status PENDING   Incomplete   CULTURE, BLOOD (ROUTINE X 2)     Status: Normal (Preliminary result)   Collection Time   10/01/11  9:34 PM      Component Value Range Status Comment   Specimen Description BLOOD RIGHT HAND   Final    Special Requests BOTTLES DRAWN AEROBIC AND ANAEROBIC 10CC   Final    Culture  Setup Time 10/02/2011 04:22   Final    Culture     Final    Value:        BLOOD CULTURE RECEIVED NO GROWTH TO DATE CULTURE WILL BE HELD FOR 5 DAYS BEFORE ISSUING A FINAL NEGATIVE REPORT   Report Status PENDING   Incomplete     Studies/Results: No results found.  Assessment: He improved promptly after readmission to the hospital. He remains unclear what caused his hemoptysis or new right lower lobe lesion. I agree with Dr. Vassie Loll that this does not look like typical healthcare associated pneumonia. I will change his vancomycin and cefepime to oral levofloxacin and continue high-dose oral fluconazole. In my opinion, if he continues to do well overnight he could be discharged home tomorrow with followup in my  clinic.  Plan: 1. Continue high-dose oral fluconazole 2. Change vancomycin and cefepime to oral levofloxacin and treat for 5 more days 3. Probable discharge home tomorrow  [name], MD [facility] [phone] pager   [phone] cell 10/04/2011, 3:36 PM

## 2011-10-04 NOTE — Progress Notes (Addendum)
PCP:  Judge Stall, MD  Principal Problem:  *HCAP (healthcare-associated pneumonia) Active Problems:  Chronic systolic congestive heart failure  LBBB  ICD - IN SITU  CAD (coronary artery disease)  Normocytic anemia  Esophageal stricture  Candidal endocarditis  Cotton wool spots  Malnutrition  V-tach  Pulmonary embolism, septic  DVT (deep venous thrombosis)  Fever  Hemoptysis    Assessment/Plan:  Healthcare acquired pneumonia On empiric antibiotic therapy - per ID recommendations  Cefepime 2g iv q 8 hrs Vancomycin per pharmacy  Day 3/7.  Patient understands he has 3 more days inpatient after today.  Known Candida endocarditis presenting with new fever Resumed IV Diflucan when patient admitted due to concern for possible recurrent fungemia. As there was no evidence for such iv Diflucan was changed to po Diflucan 400 mg daily on 10/03/11   Hemoptysis.  Appears resolved Likely related to pulmonary infarction versus pneumonia in the setting of anticoagulation. Very mild and low quantity. Did not recur in house. If recurs to a significant extent the patient may require discontinuation of anticoagulation and IVC filter placed. Patient was switched to iv Heparin for anticoagulation initially.  Changed Heparin to Xarelto 10/03/11.    Recent history of septic pulmonary emboli and DVT In setting of hemoptysis we initially held rivaroxoban and treated the patient with IV heparin. Heparin stopped 7/4 and Xarelto restarted 7/4.   Recent history of esophageal candidiasis Currently able to swallow without major difficulty. On Diflucan therapy.   Mild hyponatremia Resolved   Normocytic anemia Most likely primarily due to chronic inflammatory state - Hb stable. Follow trend  Chronic systolic CHF - continued on BB in house Initally we held lasix. Resumed lasix 10/03/11  Ischemic cardiomyopathy with AICD/biventricular pacemaker   Coronary artery disease status post coronary artery  bypass graft x4 2003 Asymptomatic at present  Hyperlipidemia  History of Hypertension  Narrative: 66 year-old male who was just recently discharged from hospital after being treated for Candida endocarditis, septic pulmonary emboli, DVT of the right lower extremity, history of ischemic cardiomyopathy (last EF measured 25-30%), and CAD status post CABG.  He was on IV Diflucan until a week ago and was changed to oral Diflucan after which patient started developing subjective feeling of fever and chills. Last evening patient coughed up some blood and he went to a local ER. From there he was transferred to [facility].  Subjective: No new events   Objective:  Intake/Output Summary (Last 24 hours) at 10/04/11 0713 Last data filed at 10/04/11 0419  Gross per 24 hour  Intake      0 ml  Output   1950 ml  Net  -1950 ml   Blood pressure 128/73, pulse 94, temperature 98.7 F (37.1 C), temperature source Oral, resp. rate 18, height 5\' 10"  (1.778 m), weight 82.3 kg (181 lb 7 oz), SpO2 94.00%. Patient Vitals for the past 24 hrs:  BP Temp Temp src Pulse Resp SpO2 Weight  10/04/11 0415 128/73 mmHg 98.7 F (37.1 C) Oral 94  18  94 % 82.3 kg (181 lb 7 oz)  10/03/11 1931 128/73 mmHg 99.2 F (37.3 C) Oral 101  18  97 % -  10/03/11 1409 118/72 mmHg 97.9 F (36.6 C) Oral 89  18  98 % -  10/03/11 1041 110/62 mmHg - - - - - -    Physical Exam: axox3 CVS: RRR RS still with rhonchi in the left lower lobe  Abdomen soft, NT  Lab Results:  Basename 10/04/11 0500 10/03/11  0454 10/02/11 0505  NA 139 136 134*  K 3.5 3.9 4.1  CL 103 104 95*  CO2 24 22 22   GLUCOSE 94 92 131*  BUN 7 10 13   CREATININE 0.68 0.71 0.85  CALCIUM 9.0 9.2 9.3  MG -- -- --  PHOS -- -- --    Basename 10/02/11 0505  AST 19  ALT 10  ALKPHOS 84  BILITOT 0.4  PROT 7.5  ALBUMIN 2.4*    Basename 10/04/11 0500 10/03/11 0609 10/02/11 0505  WBC 6.5 6.6 12.8*  NEUTROABS -- -- --  HGB 8.0* 8.0* 8.3*  HCT 26.8* 26.6*  27.1*  MCV 78.6 79.6 78.6  PLT 253 241 274    Basename 10/02/11 1050 10/02/11 0325  CKTOTAL 15 11  CKMB 1.1 1.1  CKMBINDEX -- --  TROPONINI <0.30 <0.30   Studies/Results: All recent x-ray/radiology reports have been reviewed in detail.   Medications:    . calcium carbonate  1 tablet Oral TID  . ceFEPime (MAXIPIME) IV  2 g Intravenous Q8H  . famotidine  10 mg Oral BID  . feeding supplement  237 mL Oral BID BM  . fluconazole  400 mg Oral Daily  . furosemide  40 mg Oral Daily  . loratadine  10 mg Oral Daily  . metoprolol  100 mg Oral BID  . multivitamin with minerals  1 tablet Oral QAC breakfast  . pantoprazole  80 mg Oral Q1200  . rivaroxaban  20 mg Oral Daily  . vancomycin  1,500 mg Intravenous Q12H  . DISCONTD: dextromethorphan  30 mg Oral TID  . DISCONTD: fluconazole (DIFLUCAN) IV  200 mg Intravenous QHS  . DISCONTD: multivitamin  1 tablet Oral Q breakfast  . DISCONTD: vancomycin  1,500 mg Intravenous Q12H     LAZA,SORIN [phone]  On-Call/Text Page:      [email or web address]      password [email or web address] LOS 3  Algis Downs, [place] [facility] Pager: [phone]

## 2011-10-04 NOTE — ED Provider Notes (Signed)
I saw and evaluated the patient, reviewed the resident's note and I agree with the findings and plan.  I have seen and examined this patient.  Transferred at patient's request from [place], pt with complicated recent medical history, now with hemoptysis.  Pt stable in the ED.  Agree with pt encounter as documented by the resident. Pt admitted to medical service for further management  Ethelda Chick, MD 10/04/11 [phone]

## 2011-10-04 NOTE — Progress Notes (Signed)
I have personally seen and examined Mr. Apfel.  I agree with the progress note as written by Algis Downs physician assistant Lonia Blood

## 2011-10-05 DIAGNOSIS — I4729 Other ventricular tachycardia: Secondary | ICD-10-CM

## 2011-10-05 DIAGNOSIS — I472 Ventricular tachycardia: Secondary | ICD-10-CM

## 2011-10-05 LAB — CBC
HCT: 26.8 % — ABNORMAL LOW (ref 39.0–52.0)
Hemoglobin: 8 g/dL — ABNORMAL LOW (ref 13.0–17.0)
MCH: 23.6 pg — ABNORMAL LOW (ref 26.0–34.0)
MCHC: 29.9 g/dL — ABNORMAL LOW (ref 30.0–36.0)
RBC: 3.39 MIL/uL — ABNORMAL LOW (ref 4.22–5.81)

## 2011-10-05 LAB — BASIC METABOLIC PANEL
BUN: 5 mg/dL — ABNORMAL LOW (ref 6–23)
Chloride: 102 mEq/L (ref 96–112)
Glucose, Bld: 95 mg/dL (ref 70–99)
Potassium: 3.5 mEq/L (ref 3.5–5.1)
Sodium: 138 mEq/L (ref 135–145)

## 2011-10-05 MED ORDER — DEXTROMETHORPHAN POLISTIREX 30 MG/5ML PO LQCR: 15.0000 mg | Freq: Two times a day (BID) | ORAL | Status: AC | PRN

## 2011-10-05 MED ORDER — MOMETASONE FUROATE 50 MCG/ACT NA SUSP: 2.0000 | Freq: Every day | NASAL | Status: DC

## 2011-10-05 MED ORDER — FUROSEMIDE 40 MG PO TABS: 40.0000 mg | ORAL_TABLET | Freq: Every day | ORAL | Status: DC

## 2011-10-05 MED ORDER — LEVOFLOXACIN 500 MG PO TABS: 500.0000 mg | ORAL_TABLET | Freq: Every day | ORAL | Status: AC

## 2011-10-05 MED ORDER — CETIRIZINE HCL 10 MG PO TABS: 10.0000 mg | ORAL_TABLET | Freq: Every day | ORAL | Status: DC

## 2011-10-05 MED ORDER — RIVAROXABAN 20 MG PO TABS: 20.0000 mg | ORAL_TABLET | Freq: Every day | ORAL | Status: DC

## 2011-10-05 MED ORDER — LEVOFLOXACIN 500 MG PO TABS: 500.0000 mg | ORAL_TABLET | Freq: Every day | ORAL | Status: DC

## 2011-10-05 MED ORDER — FLUCONAZOLE 200 MG PO TABS: 400.0000 mg | ORAL_TABLET | Freq: Every day | ORAL | Status: DC

## 2011-10-05 NOTE — Progress Notes (Signed)
Pt. Discharged 10/05/2011  11:54 AM Discharge instructions reviewed with patient/family. Patient/family verbalized understanding. All Rx's given. Questions answered as needed. Pt. Discharged to home with family. Jaze Rodino, Chrystine Oiler

## 2011-10-05 NOTE — Progress Notes (Signed)
Pt had 10 beats Vtach last night per night nurse. Patient in no distress.

## 2011-10-05 NOTE — Progress Notes (Signed)
Patient ID: [name], male   DOB: May 25, 1945, 66 y.o.   MRN: [id number]    [facility]    Date of Admission:  10/01/2011           Week 8 antifungal therapy        Day 4 antibacterial therapy Principal Problem:  *HCAP (healthcare-associated pneumonia) Active Problems:  Normocytic anemia  Candidal endocarditis  Fever  Hemoptysis  Chronic systolic congestive heart failure  LBBB  ICD - IN SITU  CAD (coronary artery disease)  Esophageal stricture  Cotton wool spots  Malnutrition  V-tach  Pulmonary embolism, septic  DVT (deep venous thrombosis)      . calcium carbonate  1 tablet Oral TID  . dextromethorphan  5 mL Oral BID  . famotidine  10 mg Oral BID  . feeding supplement  237 mL Oral BID BM  . fluconazole  400 mg Oral Daily  . furosemide  40 mg Oral Daily  . levofloxacin  500 mg Oral Q1200  . loratadine  10 mg Oral Daily  . metoprolol  100 mg Oral BID  . multivitamin with minerals  1 tablet Oral QAC breakfast  . pantoprazole  80 mg Oral Q1200  . rivaroxaban  20 mg Oral Daily  . DISCONTD: ceFEPime (MAXIPIME) IV  2 g Intravenous Q8H  . DISCONTD: levofloxacin  500 mg Oral Daily  . DISCONTD: vancomycin  1,500 mg Intravenous Q12H    Subjective: He is feeling much better. He has had no more hemoptysis and his cough is improving.  Objective: Temp:  [98.5 F (36.9 C)-99.1 F (37.3 C)] 98.7 F (37.1 C) (07/06 0415) Pulse Rate:  [82-100] 100  (07/06 0900) Resp:  [18] 18  (07/06 0415) BP: (122-132)/(69-80) 132/80 mmHg (07/06 0900) SpO2:  [96 %-98 %] 96 % (07/06 0415) Weight:  [81.647 kg (180 lb)-82.419 kg (181 lb 11.2 oz)] 81.647 kg (180 lb) (07/06 0625)  General: He is in good spirits he needed to go home  Skin: no rash Lungs: Diminished breath sounds with some crackles in right base  Microbiology: Recent Results (from the past 240 hour(s))  CULTURE, BLOOD (ROUTINE X 2)     Status: Normal (Preliminary result)   Collection Time   10/01/11  9:29 PM      Component Value Range Status Comment   Specimen Description BLOOD RIGHT ARM   Final    Special Requests BOTTLES DRAWN AEROBIC AND ANAEROBIC 10CC   Final    Culture  Setup Time 10/02/2011 04:22   Final    Culture     Final    Value:        BLOOD CULTURE RECEIVED NO GROWTH TO DATE CULTURE WILL BE HELD FOR 5 DAYS BEFORE ISSUING A FINAL NEGATIVE REPORT   Report Status PENDING   Incomplete   CULTURE, BLOOD (ROUTINE X 2)     Status: Normal (Preliminary result)   Collection Time   10/01/11  9:34 PM      Component Value Range Status Comment   Specimen Description BLOOD RIGHT HAND   Final    Special Requests BOTTLES DRAWN AEROBIC AND ANAEROBIC 10CC   Final    Culture  Setup Time 10/02/2011 04:22   Final    Culture     Final    Value:        BLOOD CULTURE RECEIVED NO GROWTH TO DATE CULTURE WILL BE HELD FOR 5 DAYS BEFORE ISSUING A FINAL NEGATIVE REPORT   Report Status  PENDING   Incomplete    Assessment: He has improved.   Plan: 1. Continue high-dose fluconazole until his followup visit with me in clinic  2. Continue levofloxacin for 4 more days 3. Please call if I can be of further assistance while here.  [name], MD [facility] [phone] pager   [phone] cell 10/05/2011, 12:05 PM

## 2011-10-05 NOTE — Discharge Summary (Signed)
Physician Discharge Summary  [name] OZH:[id number] DOB: 1946/01/27 DOA: 10/01/2011  PCP: Judge Stall, MD  Admit date: 10/01/2011 Discharge date: 10/05/2011   Discharge Diagnoses:  HCAP (healthcare-associated pneumonia)  Hemoptysis  Chronic systolic congestive heart failure  LBBB  ICD - IN SITU  CAD (coronary artery disease)  Normocytic anemia  Esophageal stricture and esophagitis presumed due to candida   Candidal endocarditis  Malnutrition  V-tach patient wearing a life vest  Recent  Pulmonary embolism, septic Recent  DVT (deep venous thrombosis)   Discharge Condition: good  Diet recommendation: heart healthy   History of present illness:  66 year-old male who was just recently discharged from hospital after being treated for Candida endocarditis, septic pulmonary emboli, DVT of the right lower extremity, history of ischemic cardiomyopathy last EF measured was 25-30%, CAD status post CABG was on IV Diflucan until a week ago and was changed to by mouth Diflucan after which patient started developing subjective feeling of fever and chills. Last evening patient coughed up some blood and he went to the local ER. From there he was transferred to [facility]. Patient denies any shortness of breath chest pain nausea vomiting or diarrhea.[name] His wife noticed that he was having fever spikes. In the ER patient was found to be mildly febrile and tachycardic and tachypneic though he is able to talk well. CT angiogram of the chest was done it shows persistence of his emboli. Patient at this time will be admitted for further management.    Hospital Course:  1. Healthcare acquired pneumonia patient received empiric antibiotic therapy - per ID recommendations with Cefepime 2g iv q 8 hrs And Vancomycin per pharmacy for 3 days. He then was changed to po levaquin on 10/04/11 by Dr. Orvan Falconer. On 10/05/11 the patient was DC ed home after 24 hrs of stability.   Known Candida endocarditis presenting with  new fever  Resumed IV Diflucan when patient admitted due to concern for possible recurrent fungemia. As there was no evidence for such iv Diflucan was changed to po Diflucan 400 mg daily on 10/03/11 and the patient will continue this increased diflucan dose until ID follow up visit occurs.   Hemoptysis. Likely related to pneumonia in the setting of anticoagulation. Very mild and low quantity. Did not recur in house. . Patient was switched to iv Heparin for anticoagulation initially, and after he remianed stable Changed Heparin to Xarelto 10/03/11.  48 hrs on xarelto did not induce any bleeding with stable hb Recent history of septic pulmonary emboli and DVT  In setting of hemoptysis we initially held rivaroxoban and treated the patient with IV heparin.  Heparin stopped 7/4 and Xarelto restarted 7/4.  Recent history of esophageal candidiasis  Currently able to swallow without major difficulty. On Diflucan therapy.  Normocytic anemia  Most likely primarily due to chronic inflammatory state - Hb stable. Chronic systolic CHF - continued on BB in house  Initally we held lasix. Resumed lasix 10/03/11   Coronary artery disease status post coronary artery bypass graft x4 2003  Asymptomatic at present   Chronic cough due to combination of chronic rhinitis and post nasal drip, severe GERD and pleural irritation from hcap - plan for Zyrtec, nasonex, nexium bid and delsym     Procedures:  Ct chest  Consultations:  Pulmonary  ID  Discharge Exam: Filed Vitals:   10/05/11 0900  BP: 132/80  Pulse: 100  Temp:   Resp:    Filed Vitals:   10/04/11 2022 10/05/11 0415  10/05/11 0625 10/05/11 0900  BP: 128/79 129/74  132/80  Pulse: 93 87  100  Temp: 99.1 F (37.3 C) 98.7 F (37.1 C)    TempSrc: Oral Oral    Resp: 18 18    Height:      Weight:  82.419 kg (181 lb 11.2 oz) 81.647 kg (180 lb)   SpO2: 98% 96%     General: axox3 Cardiovascular: RRR Respiratory: mainly clear - some rhonchi at right  base   Discharge Instructions  Discharge Orders    Future Appointments: Provider: Department: Dept Phone: Center:   10/09/2011 10:00 AM Vesta Mixer, MD [facility] [phone] None   11/05/2011 2:45 PM [name], MD [facility] [phone] [facility]   12/26/2011 3:00 PM Marinus Maw, MD [facility] [phone] [facility]     Future Orders Please Complete By Expires   Diet - low sodium heart healthy      Increase activity slowly        Medication List  As of 10/05/2011 11:20 AM   STOP taking these medications         loratadine 10 MG tablet         TAKE these medications         acetaminophen 325 MG tablet   Commonly known as: TYLENOL   Take 325-650 mg by mouth every 4 (four) hours as needed. For pain/fever. Pt can take up to 2 tablets as needed for pain      aspirin 81 MG EC tablet   Take 1 tablet (81 mg total) by mouth daily.      calcium carbonate 500 MG chewable tablet   Commonly known as: TUMS - dosed in mg elemental calcium   Chew 1 tablet by mouth 3 (three) times daily.      cetirizine 10 MG tablet   Commonly known as: ZYRTEC   Take 1 tablet (10 mg total) by mouth at bedtime.      clonazePAM 0.5 MG tablet   Commonly known as: KLONOPIN   Take 0.5 mg by mouth 2 (two) times daily as needed. For anxiety      dextromethorphan 30 MG/5ML liquid   Commonly known as: DELSYM   Take 2.5 mLs (15 mg total) by mouth 2 (two) times daily as needed for cough.      esomeprazole 40 MG capsule   Commonly known as: NEXIUM   Take 40 mg by mouth 2 (two) times daily.      famotidine 10 MG tablet   Commonly known as: PEPCID   Take 10 mg by mouth 2 (two) times daily.      feeding supplement Liqd   Take 237 mLs by mouth 2 (two) times daily between meals.      Flaxseed (Linseed) 1000 MG Caps   Take 1 capsule (1,000 mg total) by mouth daily.      fluconazole 200 MG tablet   Commonly known as: DIFLUCAN   Take 2 tablets (400 mg total) by mouth daily.        furosemide 40 MG tablet   Commonly known as: LASIX   Take 1 tablet (40 mg total) by mouth daily.      levofloxacin 500 MG tablet   Commonly known as: LEVAQUIN   Take 1 tablet (500 mg total) by mouth daily at 12 noon.      losartan 25 MG tablet   Commonly known as: COZAAR   Take 1 tablet (25 mg total) by  mouth daily.      metoprolol 100 MG tablet   Commonly known as: LOPRESSOR   Take 1 tablet (100 mg total) by mouth 2 (two) times daily.      mometasone 50 MCG/ACT nasal spray   Commonly known as: NASONEX   Place 2 sprays into the nose daily.      multivitamin tablet   Take 1 tablet by mouth daily with breakfast.      Rivaroxaban 20 MG Tabs   Commonly known as: XARELTO   Take 1 tablet (20 mg total) by mouth daily.      vitamin C 100 MG tablet   Take 100 mg by mouth daily.           Follow-up Information    Schedule an appointment as soon as possible for a visit with Judge Stall, MD.      Schedule an appointment as soon as possible for a visit with [name], MD.   Contact information:   [address] [place] [address] [phone]           The results of significant diagnostics from this hospitalization (including imaging, microbiology, ancillary and laboratory) are listed below for reference.    Significant Diagnostic Studies: Dg Chest 2 View  09/10/2011  *RADIOLOGY REPORT*  Clinical Data: Shortness of breath.  Cough and congestion.  History of pulmonary embolism.  CHEST - 2 VIEW  Comparison: Chest x-ray 09/06/2011.  Chest CT 09/01/2011.  Findings: There is a right upper extremity PICC with tip terminating in the mid superior vena cava. The patient is status post median sternotomyfor CABG with a LIMA. Lung volumes are normal.  Mild elevation of the right hemidiaphragm has increased compared to the prior study.  There continues to be patchy areas of interstitial prominence and areas of airspace consolidation throughout the lungs  bilaterally, most pronounced in the right middle and lower lobes.  Mild thickening of the horizontal fissure is again noted.  Small right-sided pleural effusion.  No definite left pleural effusion.  No evidence of pulmonary edema.  Heart size is borderline enlarged (unchanged). The patient is rotated to the right on today's exam, resulting in distortion of the mediastinal contours and reduced diagnostic sensitivity and specificity for mediastinal pathology.  Atherosclerosis in the thoracic aorta.  IMPRESSION: 1.  The appearance of chest is very similar to the recent prior study from 09/06/2011, but overall demonstrates slightly improved aeration.  Findings are suggestive of resolving areas of pulmonary hemorrhage related to resolving areas of pulmonary infarction. 2.  Small right-sided pleural effusion has also slightly decreased in size. 3.  Increased elevation of the right hemidiaphragm. 4.  Postoperative changes and support apparatus, as above. 5.  Atherosclerosis.  Original Report Authenticated By: Florencia Reasons, M.D.   Ct Angio Chest W/cm &/or Wo Cm  10/02/2011  *RADIOLOGY REPORT*  Clinical Data: Hemoptysis, history of pulmonary emboli.  CT ANGIOGRAPHY CHEST  Technique:  Multidetector CT imaging of the chest using the standard protocol during bolus administration of intravenous contrast. Multiplanar reconstructed images including MIPs were obtained and reviewed to evaluate the vascular anatomy.  Contrast: OMNIPAQUE IOHEXOL 350 MG/ML SOLN  Comparison: 09/10/2011 radiograph, 09/01/2011 CT  Findings: Large filling defect within the right lower lobe pulmonary arterial branches again noted. Embolus within the left upper lobe/lingular branch again noted.  Peripheral left lower lobe branches are poorly evaluated due to respiratory motion and streak artifact from contact with the gantry on the right.  Normal caliber  aorta.  Status post median sternotomy and CABG. Heart size upper normal.  Small right  pleural effusion.  Prominent mediastinal and right hilar lymph nodes again noted.  As index, a right paratracheal lymph node measures 1.1 cm short axis.  Limited images through the upper abdomen demonstrate no acute change.  Central airways are patent. Subpleural bullous changes along the right hemithorax are similar to priors.  There is been some interval clearing of the right lower lobe consolidation however the residual demonstrates a multi focal rounded/mass-like configuration.  On the left, there are multiple peripheral wedge shaped opacities, some of which are smaller however some of which have developed in the interval.  No definitive pneumothorax.  Circumferential esophageal wall thickening.  Multilevel degenerative changes of the imaged spine. No acute or aggressive appearing osseous lesion.  IMPRESSION: Bilateral pulmonary emboli again noted.  Evolving airspace opacities, demonstrating a more multi focal mass- like configuration on the right. May reflect a combination of infarct and infection.  Left-sided wedge-shaped opacities, some of which are smaller however some which have developed in the interval.  Circumferential esophageal wall thickening may reflect esophagitis.  Discussed via telephone with the ER physician at 11:55 p.m. on 10/01/2011.  Original Report Authenticated By: Waneta Martins, M.D.   Dg Chest Port 1 View  09/06/2011  *RADIOLOGY REPORT*  Clinical Data: Evaluate PICC line placement  PORTABLE CHEST - 1 VIEW  Comparison: 09/01/2011  Findings:  Right arm PICC line tip is in the SVC.  There is mild cardiac enlargement and mild interstitial edema. Partially loculated right pleural effusion is again noted.  Airspace opacities noted within the left midlung, similar to previous exam.  IMPRESSION:  1.  No change and pulmonary edema and pleural effusion. 2.  Persistent left midlung airspace opacity.  Original Report Authenticated By: Rosealee Albee, M.D.    Microbiology: Recent Results  (from the past 240 hour(s))  CULTURE, BLOOD (ROUTINE X 2)     Status: Normal (Preliminary result)   Collection Time   10/01/11  9:29 PM      Component Value Range Status Comment   Specimen Description BLOOD RIGHT ARM   Final    Special Requests BOTTLES DRAWN AEROBIC AND ANAEROBIC 10CC   Final    Culture  Setup Time 10/02/2011 04:22   Final    Culture     Final    Value:        BLOOD CULTURE RECEIVED NO GROWTH TO DATE CULTURE WILL BE HELD FOR 5 DAYS BEFORE ISSUING A FINAL NEGATIVE REPORT   Report Status PENDING   Incomplete   CULTURE, BLOOD (ROUTINE X 2)     Status: Normal (Preliminary result)   Collection Time   10/01/11  9:34 PM      Component Value Range Status Comment   Specimen Description BLOOD RIGHT HAND   Final    Special Requests BOTTLES DRAWN AEROBIC AND ANAEROBIC 10CC   Final    Culture  Setup Time 10/02/2011 04:22   Final    Culture     Final    Value:        BLOOD CULTURE RECEIVED NO GROWTH TO DATE CULTURE WILL BE HELD FOR 5 DAYS BEFORE ISSUING A FINAL NEGATIVE REPORT   Report Status PENDING   Incomplete      Labs: Basic Metabolic Panel:  Lab 10/05/11 1610 10/04/11 0500 10/03/11 0609 10/02/11 0505 10/01/11 2202  NA 138 139 136 134* 135  K 3.5 3.5 3.9 4.1 3.6  CL 102 103 104 95* 97  CO2 27 24 22 22 24   GLUCOSE 95 94 92 131* 112*  BUN 5* 7 10 13 13   CREATININE 0.76 0.68 0.71 0.85 0.77  CALCIUM 9.1 9.0 9.2 9.3 9.0  MG -- -- -- -- --  PHOS -- -- -- -- --   Liver Function Tests:  Lab 10/02/11 0505  AST 19  ALT 10  ALKPHOS 84  BILITOT 0.4  PROT 7.5  ALBUMIN 2.4*   No results found for this basename: LIPASE:5,AMYLASE:5 in the last 168 hours No results found for this basename: AMMONIA:5 in the last 168 hours CBC:  Lab 10/05/11 0551 10/04/11 0500 10/03/11 0609 10/02/11 0505 10/01/11 2202  WBC 5.5 6.5 6.6 12.8* 10.9*  NEUTROABS -- -- -- -- --  HGB 8.0* 8.0* 8.0* 8.3* 8.8*  HCT 26.8* 26.8* 26.6* 27.1* 28.3*  MCV 79.1 78.6 79.6 78.6 78.2  PLT 259 253 241 274 265     Cardiac Enzymes:  Lab 10/02/11 1050 10/02/11 0325  CKTOTAL 15 11  CKMB 1.1 1.1  CKMBINDEX -- --  TROPONINI <0.30 <0.30   BNP: BNP (last 3 results)  Basename 09/01/11 0500 08/27/11 2100 08/17/11 0415  PROBNP 973.1* 1011.0* 4005.0*   CBG: No results found for this basename: GLUCAP:5 in the last 168 hours  Time coordinating discharge: 40 minutes   Signed:  Lonia Blood, MD  [facility] 10/05/2011, 11:20 AM

## 2011-10-08 LAB — CULTURE, BLOOD (ROUTINE X 2): Culture: NO GROWTH

## 2011-10-09 ENCOUNTER — Encounter: Payer: Self-pay | Admitting: Cardiovascular Disease

## 2011-10-09 ENCOUNTER — Ambulatory Visit (INDEPENDENT_AMBULATORY_CARE_PROVIDER_SITE_OTHER): Payer: Medicare Other | Admitting: Cardiovascular Disease

## 2011-10-09 VITALS — BP 102/73 | HR 68 | Temp 97.2°F | Ht 70.0 in | Wt 179.0 lb

## 2011-10-09 DIAGNOSIS — I509 Heart failure, unspecified: Secondary | ICD-10-CM

## 2011-10-09 DIAGNOSIS — I5022 Chronic systolic (congestive) heart failure: Secondary | ICD-10-CM
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
## 2011-10-09 NOTE — Assessment & Plan Note (Signed)
It seems to be holding his own. He has known congestive heart failure with an ejection fraction of around 30%. Class III heart failure. He did a lot better with his biventricular pacer. The plan is to replace his biventricular pacer/ICD when we have verified that he is free of the candida infection

## 2011-10-09 NOTE — Patient Instructions (Addendum)
Your physician discussed the importance of regular exercise and recommended that you start or continue a regular exercise program for good health. DR Elease Hashimoto WANTS YOU TOP USE WEIGHTS DAILY FOR STRENGTH BUILDING  Your physician recommends that you schedule a follow-up appointment in: 3 MONTHS / EKG   Exercise to Stay Healthy Exercise helps you become and stay healthy. EXERCISE IDEAS AND TIPS Choose exercises that:  You enjoy.   Fit into your day.  You do not need to exercise really hard to be healthy. You can do exercises at a slow or medium level and stay healthy. You can:  Stretch before and after working out.   Try yoga, Pilates, or tai chi.   Lift weights.   Walk fast, swim, jog, run, climb stairs, bicycle, dance, or rollerskate.   Take aerobic classes.  Exercises that burn about 150 calories:  Running 1  miles in 15 minutes.   Playing volleyball for 45 to 60 minutes.   Washing and waxing a car for 45 to 60 minutes.   Playing touch football for 45 minutes.   Walking 1  miles in 35 minutes.   Pushing a stroller 1  miles in 30 minutes.   Playing basketball for 30 minutes.   Raking leaves for 30 minutes.   Bicycling 5 miles in 30 minutes.   Walking 2 miles in 30 minutes.   Dancing for 30 minutes.   Shoveling snow for 15 minutes.   Swimming laps for 20 minutes.   Walking up stairs for 15 minutes.   Bicycling 4 miles in 15 minutes.   Gardening for 30 to 45 minutes.   Jumping rope for 15 minutes.   Washing windows or floors for 45 to 60 minutes.  Document Released: 04/20/2010 Document Revised: 03/07/2011 Document Reviewed: 04/20/2010 [facility] Patient Information 2012 [place], [place].

## 2011-10-11 LAB — AFB CULTURE WITH SMEAR (NOT AT ARMC): Acid Fast Smear: NONE SEEN

## 2011-10-14 ENCOUNTER — Other Ambulatory Visit (HOSPITAL_COMMUNITY): Payer: Self-pay | Admitting: Ophthalmology

## 2011-10-15 NOTE — Telephone Encounter (Signed)
Patient is not followed in our clinic.  Please let the pharmacy know.

## 2011-11-01 ENCOUNTER — Other Ambulatory Visit: Payer: Self-pay

## 2011-11-01 MED ORDER — METOPROLOL TARTRATE 100 MG PO TABS: 100.0000 mg | ORAL_TABLET | Freq: Two times a day (BID) | ORAL | Status: DC

## 2011-11-01 MED ORDER — RIVAROXABAN 20 MG PO TABS: 20.0000 mg | ORAL_TABLET | Freq: Every day | ORAL | Status: DC

## 2011-11-01 NOTE — Telephone Encounter (Signed)
..   Requested Prescriptions   Signed Prescriptions Disp Refills  . metoprolol (LOPRESSOR) 100 MG tablet 60 tablet 6    Sig: Take 1 tablet (100 mg total) by mouth 2 (two) times daily.    Authorizing Provider: Vesta Mixer    Ordering User: Christella Hartigan, Eliyah Mcshea Judie Petit

## 2011-11-01 NOTE — Telephone Encounter (Signed)
..   Requested Prescriptions   Pending Prescriptions Disp Refills  . Rivaroxaban (XARELTO) 20 MG TABS 30 tablet 6    Sig: Take 1 tablet (20 mg total) by mouth daily.

## 2011-11-05 ENCOUNTER — Ambulatory Visit (INDEPENDENT_AMBULATORY_CARE_PROVIDER_SITE_OTHER): Payer: Medicare Other | Admitting: Internal Medicine

## 2011-11-05 ENCOUNTER — Encounter: Payer: Self-pay | Admitting: Internal Medicine

## 2011-11-05 ENCOUNTER — Other Ambulatory Visit: Payer: Self-pay | Admitting: Cardiovascular Disease

## 2011-11-05 ENCOUNTER — Other Ambulatory Visit: Payer: Self-pay | Admitting: *Deleted

## 2011-11-05 VITALS — BP 122/78 | HR 73 | Temp 97.6°F | Ht 70.0 in | Wt 170.0 lb

## 2011-11-05 DIAGNOSIS — B376 Candidal endocarditis: Secondary | ICD-10-CM

## 2011-11-05 MED ORDER — RIVAROXABAN 20 MG PO TABS: 20.0000 mg | ORAL_TABLET | Freq: Every day | ORAL | Status: DC

## 2011-11-05 NOTE — Telephone Encounter (Signed)
Opened in Error.

## 2011-11-05 NOTE — Progress Notes (Signed)
Patient ID: [name], male   DOB: 1945/09/27, 66 y.o.   MRN: [id number]    [facility] for Infectious Disease  Patient Active Problem List  Diagnosis  . Chronic systolic congestive heart failure  . LBBB  . ICD - IN SITU  . CAD (coronary artery disease)  . Normocytic anemia  . Esophageal stricture  . Multiple pulmonary nodules  . Acute respiratory failure with hypoxia  . Candidal endocarditis  . Cotton wool spots  . Pleural effusion  . Malnutrition  . V-tach  . Pulmonary embolism, septic  . DVT (deep venous thrombosis)  . Fever  . Hemoptysis  . HCAP (healthcare-associated pneumonia)    Patient's Medications  New Prescriptions   No medications on file  Previous Medications   ACETAMINOPHEN (TYLENOL) 325 MG TABLET    Take 325-650 mg by mouth every 4 (four) hours as needed. For pain/fever. Pt can take up to 2 tablets as needed for pain   ASPIRIN EC 81 MG EC TABLET    Take 1 tablet (81 mg total) by mouth daily.   CETIRIZINE (ZYRTEC) 10 MG TABLET    Take 1 tablet (10 mg total) by mouth at bedtime.   CLONAZEPAM (KLONOPIN) 0.5 MG TABLET    Take 0.5 mg by mouth 2 (two) times daily as needed. For anxiety   ESOMEPRAZOLE (NEXIUM) 40 MG CAPSULE    Take 40 mg by mouth daily before breakfast.    FLUCONAZOLE (DIFLUCAN) 200 MG TABLET    Take 200 mg by mouth daily. Take 2 tablets by mouth daily   FUROSEMIDE (LASIX) 40 MG TABLET    Take 1 tablet (40 mg total) by mouth daily.   LOSARTAN (COZAAR) 25 MG TABLET    Take 1 tablet (25 mg total) by mouth daily.   METOPROLOL (LOPRESSOR) 100 MG TABLET    Take 1 tablet (100 mg total) by mouth 2 (two) times daily.   MOMETASONE (NASONEX) 50 MCG/ACT NASAL SPRAY    Place 2 sprays into the nose daily.   RIVAROXABAN (XARELTO) 20 MG TABS    Take 1 tablet (20 mg total) by mouth daily.  Modified Medications   No medications on file  Discontinued Medications   FEEDING SUPPLEMENT (ENSURE COMPLETE) LIQD    Take 237 mLs by mouth as needed.     Subjective: [name] is in for his routine followup visit. He is now completed approximately 3 months of antifungal therapy for his unusual candidal esophagitis, pacemaker endocarditis and septic pulmonary emboli. He was rehospitalized one month ago with another episode of hemoptysis but this resolved spontaneously. His chest CT scan at that time showed a new rounded density in the right base but was otherwise improved. He has not had any further hemoptysis, fever or shortness of breath and his appetite has improved. He is feeling much better.  He has had some problem with sinus congestion over the past month. This is most noticeable when he is sitting up and resolves quickly upon laying down. He has not had any rhinorrhea or sneezing. The congestion has not improved with Zyrtec or Nasonex. He has tried using a nettie pot without any relief.  He states that he is also noted some generalized aching in his muscles and joints. He recalls that this began after he started Xarelto.  Objective: Temp: 97.6 F (36.4 C) (08/06 1439) Temp src: Oral (08/06 1439) BP: 122/78 mmHg (08/06 1439) Pulse Rate: 73  (08/06 1439)  General: He is in good spirits Skin:  No rash Lungs: Few right lung base crackles posteriorly Cor: Regular S1 and S2 no murmurs Abdomen: Nontender No acute abnormalities of his joints or extremities    Assessment: He is doing much better on therapy for his disseminated candidal infection. All of his blood cultures were negative before therapy so repeat blood cultures will not be helpful in determining when it is safe to replace his pacemaker. I would favor repeating a chest CT scan in about one month. I would feel better about replacing the pacemaker once the chest CT scan shows significant improvement.  Plan: 1. Continue fluconazole 2. Obtain lab work done recently and his primary care physician office 3. Followup in 6 weeks   [name], MD [facility] for  Infectious Disease [facility] [phone] pager   [phone] cell 11/05/2011, 3:04 PM

## 2011-11-08 ENCOUNTER — Other Ambulatory Visit: Payer: Self-pay | Admitting: Internal Medicine

## 2011-11-12 ENCOUNTER — Telehealth: Payer: Self-pay | Admitting: Internal Medicine

## 2011-11-12 NOTE — Telephone Encounter (Signed)
Please return call to patient [phone] regarding medical treatment questions

## 2011-11-12 NOTE — Telephone Encounter (Signed)
Pt calling back to add that he also has a question re losartin

## 2011-11-12 NOTE — Telephone Encounter (Signed)
Dr Elease Hashimoto said to stop losartan and f/u in 1-2 months, keep track of BP and bring with him on OV, pt already had f/u set in 2 months and will get BP, pt agreed to plan

## 2011-11-12 NOTE — Telephone Encounter (Signed)
He thinks the Losartan is causing his leg pain.  His pain is so bad he has to take pain pills to walk. He has been out of the hospital for about a month He wants to know if Dr Elease Hashimoto will suggest something else for him to take in its place I let him know I would forward this to his nurse for review.

## 2011-12-11 ENCOUNTER — Other Ambulatory Visit: Payer: Self-pay | Admitting: Internal Medicine

## 2011-12-16 ENCOUNTER — Telehealth: Payer: Self-pay

## 2011-12-16 NOTE — Telephone Encounter (Signed)
Pt states he has been experiencing hair loss since starting fluconazole in May.  He states his symptoms are better and would like to discontinue if possible.   He is currently taking fluconazole 200 mg daily.   Laurell Josephs, RN

## 2011-12-19 NOTE — Telephone Encounter (Signed)
Please call [name] and see if he thinks it this decision can wait until his upcoming visit with me on October 30.

## 2011-12-26 ENCOUNTER — Encounter: Payer: Self-pay | Admitting: Internal Medicine

## 2011-12-26 ENCOUNTER — Ambulatory Visit (INDEPENDENT_AMBULATORY_CARE_PROVIDER_SITE_OTHER): Payer: Medicare Other | Admitting: Internal Medicine

## 2011-12-26 VITALS — BP 120/80 | HR 65 | Ht 70.0 in | Wt 189.0 lb

## 2011-12-26 DIAGNOSIS — I509 Heart failure, unspecified: Secondary | ICD-10-CM

## 2011-12-26 DIAGNOSIS — I4729 Other ventricular tachycardia: Secondary | ICD-10-CM

## 2011-12-26 DIAGNOSIS — I5022 Chronic systolic (congestive) heart failure: Secondary | ICD-10-CM

## 2011-12-26 DIAGNOSIS — B376 Candidal endocarditis: Secondary | ICD-10-CM

## 2011-12-26 DIAGNOSIS — I472 Ventricular tachycardia, unspecified: Secondary | ICD-10-CM

## 2011-12-26 NOTE — Assessment & Plan Note (Signed)
The patient remains stable. His heart failure is still class III. He has left bundle branch block. He has chronic systolic dysfunction with an ejection fraction of 30%. We will plan ICD implantation once he is gone back and seen his infectious disease specialist and undergone CT scan of the chest.

## 2011-12-26 NOTE — Assessment & Plan Note (Signed)
He is still wearing his life vest. He has had no recurrent ventricular arrhythmias.
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
## 2011-12-26 NOTE — Patient Instructions (Signed)
Your physician has recommended that you have a defibrillator inserted. An implantable cardioverter defibrillator (ICD) is a small device that is placed in your chest or, in rare cases, your abdomen. This device uses electrical pulses or shocks to help control life-threatening, irregular heartbeats that could lead the heart to suddenly stop beating (sudden cardiac arrest). Leads are attached to the ICD that goes into your heart. This is done in the hospital and usually requires an overnight stay. Please see the instruction sheet given to you today for more information.---in 3-4 weeks   Your physician has requested that you have an echocardiogram. Echocardiography is a painless test that uses sound waves to create images of your heart. It provides your doctor with information about the size and shape of your heart and how well your heart's chambers and valves are working. This procedure takes approximately one hour. There are no restrictions for this procedure.

## 2011-12-26 NOTE — Assessment & Plan Note (Addendum)
The patient remains afebrile but is still on fluconazole. He will followup with his infectious disease specialist in the next week. We will plan to proceed with ICD implantation if he is given clearance by Dr. Orvan Falconer. CT scan of the chest is pending. We'll plan to obtain 2-D echo to rule out recurrent vegetation.

## 2011-12-27 ENCOUNTER — Telehealth: Payer: Self-pay | Admitting: *Deleted

## 2011-12-27 LAB — BASIC METABOLIC PANEL: Creatinine: 0.8 mg/dL (ref 0.6–1.3)

## 2011-12-27 LAB — CBC AND DIFFERENTIAL: Platelets: 322 10*3/uL (ref 150–399)

## 2011-12-27 NOTE — Telephone Encounter (Signed)
Patient called and advised he has just seen his cardiologist. He advised that the cardiologist requested that we do a Chest CT on him prior to them placing his pacemaker. He also asked that since he does not live in [place] if we could set it up for the same day his has to be here for his next appt with Dr Orvan Falconer 01/02/12 @ 245. Advised the patient will have to ask Dr Orvan Falconer to see if he wants this and if so he can put the order in and I can try to get it authorized before next week. Advised the patient that someone will give him a call once a decision has been made.

## 2011-12-31 ENCOUNTER — Ambulatory Visit (HOSPITAL_COMMUNITY): Payer: Medicare Other | Attending: Cardiology | Admitting: Radiology

## 2011-12-31 DIAGNOSIS — I5022 Chronic systolic (congestive) heart failure: Secondary | ICD-10-CM

## 2011-12-31 DIAGNOSIS — I059 Rheumatic mitral valve disease, unspecified: Secondary | ICD-10-CM | POA: Insufficient documentation

## 2011-12-31 DIAGNOSIS — I10 Essential (primary) hypertension: Secondary | ICD-10-CM | POA: Insufficient documentation

## 2011-12-31 DIAGNOSIS — I2589 Other forms of chronic ischemic heart disease: Secondary | ICD-10-CM | POA: Insufficient documentation

## 2011-12-31 DIAGNOSIS — I379 Nonrheumatic pulmonary valve disorder, unspecified: Secondary | ICD-10-CM | POA: Insufficient documentation

## 2011-12-31 DIAGNOSIS — I509 Heart failure, unspecified: Secondary | ICD-10-CM

## 2011-12-31 DIAGNOSIS — I369 Nonrheumatic tricuspid valve disorder, unspecified: Secondary | ICD-10-CM | POA: Insufficient documentation

## 2011-12-31 NOTE — Progress Notes (Signed)
Echocardiogram performed.  

## 2012-01-02 ENCOUNTER — Encounter: Payer: Self-pay | Admitting: Cardiovascular Disease

## 2012-01-02 ENCOUNTER — Encounter: Payer: Self-pay | Admitting: Internal Medicine

## 2012-01-02 ENCOUNTER — Ambulatory Visit (INDEPENDENT_AMBULATORY_CARE_PROVIDER_SITE_OTHER): Payer: Medicare Other | Admitting: Internal Medicine

## 2012-01-02 VITALS — BP 116/74 | HR 66 | Temp 97.8°F | Wt 189.0 lb

## 2012-01-02 DIAGNOSIS — B376 Candidal endocarditis: Secondary | ICD-10-CM

## 2012-01-02 NOTE — Progress Notes (Signed)
Patient ID: MCGREGOR TINNON, male   DOB: Apr 30, 1945, 66 y.o.   MRN: [id number]    [facility] for Infectious Disease  Patient Active Problem List  Diagnosis  . Chronic systolic congestive heart failure  . LBBB  . ICD - IN SITU  . CAD (coronary artery disease)  . Normocytic anemia  . Esophageal stricture  . Multiple pulmonary nodules  . Acute respiratory failure with hypoxia  . Candidal endocarditis  . Cotton wool spots  . Pleural effusion  . Malnutrition  . V-tach  . Pulmonary embolism, septic  . DVT (deep venous thrombosis)  . Fever  . Hemoptysis  . HCAP (healthcare-associated pneumonia)    Patient's Medications  New Prescriptions   No medications on file  Previous Medications   ACETAMINOPHEN (TYLENOL) 325 MG TABLET    Take 325-650 mg by mouth every 4 (four) hours as needed. For pain/fever. Pt can take up to 2 tablets as needed for pain   ASPIRIN EC 81 MG EC TABLET    Take 1 tablet (81 mg total) by mouth daily.   CLONAZEPAM (KLONOPIN) 1 MG TABLET    Take 1 mg by mouth 2 (two) times daily as needed.   ESOMEPRAZOLE (NEXIUM) 40 MG CAPSULE    Take 40 mg by mouth daily before breakfast.    FLUCONAZOLE (DIFLUCAN) 200 MG TABLET    TAKE 2 TABLETS BY MOUTH DAILY   FUROSEMIDE (LASIX) 20 MG TABLET    Take 20 mg by mouth daily.   IBUPROFEN (ADVIL,MOTRIN) 200 MG TABLET    Take 200 mg by mouth every 6 (six) hours as needed.   METOPROLOL (LOPRESSOR) 100 MG TABLET    Take 1 tablet (100 mg total) by mouth 2 (two) times daily.   MOMETASONE (NASONEX) 50 MCG/ACT NASAL SPRAY    Place 2 sprays into the nose daily.   RIVAROXABAN (XARELTO) 20 MG TABS    Take 1 tablet (20 mg total) by mouth daily.  Modified Medications   No medications on file  Discontinued Medications   No medications on file    Subjective: Mr. Yearby is in for his routine followup visit. 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  His old pacemaker was removed. He has continued to wear his life vest. He has not had any further fever or swallowing difficulties. He denies any chills or sweats. He states that his appetite is "to good." He thinks the fluconazole was causing him to have hair loss. He does not like taking these Xarelto because he believes it is causing weakness in his legs which leads to buckling of his legs when he is walking. He has had no further hemoptysis.  Objective: Temp: 97.8 F (36.6 C) (10/03 1447) Temp src: Oral (10/03 1447) BP: 116/74 mmHg (10/03 1447) Pulse Rate: 66  (10/03 1447)  General: he is in good spirits his usual Skin: those blood or conjunctival hemorrhages Lungs: clear Cor: distant but regular S1 and S2 with no murmurs. He is wearing his life vest   Assessment: I think there is a strong possibility that is candidal endocarditis, septic pulmonary emboli and pneumonia have resolved with removal of his pacemakerand 5 months of fluconazole therapy. His recent repeat a transthoracic echocardiogram showed a density in the right atrium of uncertain significance. No valvular abnormalities were noted. I will repeat a chest CT scan today before making a decision about discontinuation of his fluconazole. If it is  important that we get that information before making a decision about timing of reimplantation of a new pacemaker.  Plan: 1. Continue fluconazole for now pending results of repeat chest CT scan 2. Obtain results of recent blood work done by his primary care physician, Dr. Judge Stall, to see if his anemia is improving   [name], MD [facility] for Infectious Disease [facility] [phone] pager   [phone] cell 01/02/2012, 3:21 PM

## 2012-01-07 ENCOUNTER — Telehealth: Payer: Self-pay | Admitting: *Deleted

## 2012-01-07 ENCOUNTER — Encounter: Payer: Self-pay | Admitting: *Deleted

## 2012-01-07 NOTE — Telephone Encounter (Signed)
Called patient to advise him of the appt made for his CT Angio. Gave him 01-08-12 at 1245 at [facility]. He took the appt and advised it was fine with his other scheduled appts.

## 2012-01-08 ENCOUNTER — Other Ambulatory Visit: Payer: Self-pay | Admitting: Internal Medicine

## 2012-01-08 ENCOUNTER — Ambulatory Visit (HOSPITAL_COMMUNITY)
Admission: RE | Admit: 2012-01-08 | Discharge: 2012-01-08 | Disposition: A | Discharge status: Home / Self Care | Payer: Medicare Other | Source: Ambulatory Visit | Attending: Internal Medicine | Admitting: Internal Medicine

## 2012-01-08 ENCOUNTER — Encounter (HOSPITAL_COMMUNITY): Payer: Self-pay

## 2012-01-08 DIAGNOSIS — B376 Candidal endocarditis: Secondary | ICD-10-CM

## 2012-01-08 DIAGNOSIS — I2699 Other pulmonary embolism without acute cor pulmonale: Secondary | ICD-10-CM | POA: Insufficient documentation

## 2012-01-08 MED ORDER — IOHEXOL 300 MG/ML  SOLN: 100.0000 mL | Freq: Once | INTRAMUSCULAR | Status: AC | PRN

## 2012-01-13 ENCOUNTER — Telehealth: Payer: Self-pay | Admitting: *Deleted

## 2012-01-13 NOTE — Telephone Encounter (Signed)
Patient called to see if we had gotten the results of his recent CT Angio. Advised him yes but that I am not qualified to give him the results. He understood just wanted to have Dr Orvan Falconer read it and give Dr Lubertha Basque office a call with the results as he is waiting for clearance for his Pacemaker procedure. Advised the patient will send Dr Orvan Falconer an message and have him review the results and see if he can give clearance for the procedure. Advised someone will give him a call either way. Patient was fine with this course of action and advised will wait to hear from [place].

## 2012-01-15 ENCOUNTER — Encounter: Payer: Self-pay | Admitting: Cardiovascular Disease

## 2012-01-15 ENCOUNTER — Ambulatory Visit (INDEPENDENT_AMBULATORY_CARE_PROVIDER_SITE_OTHER): Payer: Medicare Other | Admitting: Cardiovascular Disease

## 2012-01-15 VITALS — BP 136/70 | HR 85 | Ht 70.0 in | Wt 196.0 lb

## 2012-01-15 DIAGNOSIS — I5022 Chronic systolic (congestive) heart failure: Secondary | ICD-10-CM

## 2012-01-15 DIAGNOSIS — I509 Heart failure, unspecified: Secondary | ICD-10-CM

## 2012-01-15 DIAGNOSIS — I82409 Acute embolism and thrombosis of unspecified deep veins of unspecified lower extremity: Secondary | ICD-10-CM

## 2012-01-15 NOTE — Patient Instructions (Addendum)
Your physician recommends that you schedule a follow-up appointment in: 3 Months  Your physician recommends that you continue on your current medications as directed. Please refer to the Current Medication list given to you today.   
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
## 2012-01-15 NOTE — Assessment & Plan Note (Signed)
[name] seems to be doing well. It has been very difficult to keep him on standard heart failure medications because of numerous drug intolerances. I think he looks great and is overall getting stronger and stronger every day.  We'll continue with the metoprolol.  Try him on ACE inhibitors and angiotensin blockers but he does not tolerate them.  He has been many months without any fevers. I think that he is ready to have his biventricular pacemaker/ICD put back in. He's had a CT scan of his lungs. He was found to have a small nodule which appear to be new but the radiologist thought it might be due to inflammation and not do to a new infection.  We will ask Dr. Orvan Falconer to review the data and make sure that he is satisfied that this does not have any new infections.

## 2012-01-15 NOTE — Assessment & Plan Note (Signed)
[name] developed a DVT while in the hospital when he was at prolonged bed rest. He took the Xarelto for about 3-4 months but then was not able to tolerate it.  He developed significant leg pains.   He also stated that it was very expensive and that he would not be able to afford it. He took a total of 3-4 months I suspect that this is adequate for treatment of his DVT. I suggested that we may try Coumadin and he did not want take Coumadin.

## 2012-01-16 ENCOUNTER — Telehealth: Payer: Self-pay | Admitting: Cardiovascular Disease

## 2012-01-16 ENCOUNTER — Telehealth: Payer: Self-pay | Admitting: *Deleted

## 2012-01-16 NOTE — Telephone Encounter (Signed)
Pt inquiring about scheduling  App for new pacer placement. Please call him back.

## 2012-01-16 NOTE — Telephone Encounter (Signed)
Please see previous note from [place], patient's wife is calling again regarding CT results. Wendall Mola

## 2012-01-16 NOTE — Telephone Encounter (Signed)
Please call [name] and let him know that I am still reviewing the scan with his other physicians and will call him on Monday. Please apologize to him from either way.

## 2012-01-16 NOTE — Telephone Encounter (Signed)
Waiting to hear from Dr Orvan Falconer about CT and when Dr Ladona Ridgel can proceed with Bi-V ICD implant  They have left 2 messages with Dr Blair Dolphin office and have not heard back

## 2012-01-16 NOTE — Telephone Encounter (Signed)
New problem    Status of upcoming procedure  

## 2012-01-20 ENCOUNTER — Telehealth: Payer: Self-pay | Admitting: Internal Medicine

## 2012-01-20 NOTE — Telephone Encounter (Signed)
[name] chest CT scan shows significant improvement in the lesions that were present it lasted July. However, he has a new 3.5 cm lesion in the left lower lobe of unknown cause. I spoke with him today. He is feeling much better since stopping the Xarelto. He continues to take his fluconazole but very much wants to stop it. I told him that I could not assure him that there is not some active, residual infection do to Candida. He still prefers to stop and see what happens. I would suggest repeating blood cultures in 4-6 weeks before deciding on reimplantation of a permanent pacemaker. He will follow up with me in one month.

## 2012-01-22 ENCOUNTER — Encounter: Payer: Self-pay | Admitting: Cardiovascular Disease

## 2012-01-28 NOTE — Telephone Encounter (Signed)
Dr Ladona Ridgel spoke with Dr Orvan Falconer with infectious disease  The recommendation is to take Fluconazole for 12 months and have the device reimplanted or just continue to wear the LV .  Will schedule patient to see Dr Ladona Ridgel in the next few weeks on 02/05/12 to discuss the above

## 2012-02-05 ENCOUNTER — Ambulatory Visit (INDEPENDENT_AMBULATORY_CARE_PROVIDER_SITE_OTHER): Payer: Medicare Other | Admitting: Internal Medicine

## 2012-02-05 ENCOUNTER — Encounter: Payer: Self-pay | Admitting: Internal Medicine

## 2012-02-05 ENCOUNTER — Encounter: Payer: Self-pay | Admitting: *Deleted

## 2012-02-05 VITALS — BP 102/58 | HR 71 | Ht 70.0 in | Wt 203.0 lb

## 2012-02-05 DIAGNOSIS — I5022 Chronic systolic (congestive) heart failure: Secondary | ICD-10-CM

## 2012-02-05 DIAGNOSIS — Z01812 Encounter for preprocedural laboratory examination: Secondary | ICD-10-CM

## 2012-02-05 DIAGNOSIS — I4729 Other ventricular tachycardia: Secondary | ICD-10-CM

## 2012-02-05 DIAGNOSIS — I472 Ventricular tachycardia, unspecified: Secondary | ICD-10-CM

## 2012-02-05 DIAGNOSIS — I509 Heart failure, unspecified: Secondary | ICD-10-CM

## 2012-02-05 DIAGNOSIS — I2690 Septic pulmonary embolism without acute cor pulmonale: Secondary | ICD-10-CM

## 2012-02-05 DIAGNOSIS — Z95810 Presence of automatic (implantable) cardiac defibrillator: Secondary | ICD-10-CM

## 2012-02-05 NOTE — Assessment & Plan Note (Signed)
The patient's defibrillator has been removed. He is now ready to undergo insertion of a new biventricular pacemaker. I've recommended that the patient start back on fluconazole twice daily. He is willing to take this medication.
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
## 2012-02-05 NOTE — Assessment & Plan Note (Signed)
Of note his CT scan results. He will continue antibiotic therapy. There is no evidence of active infection.

## 2012-02-05 NOTE — Assessment & Plan Note (Signed)
His symptoms are class II. He will continue his current medical therapy and will be scheduled for biventricular pacemaker insertion in the next few weeks.

## 2012-02-10 ENCOUNTER — Encounter (HOSPITAL_COMMUNITY): Payer: Self-pay | Admitting: Pharmacy Technician

## 2012-02-13 ENCOUNTER — Other Ambulatory Visit: Payer: Self-pay | Admitting: *Deleted

## 2012-02-13 DIAGNOSIS — I472 Ventricular tachycardia, unspecified: Secondary | ICD-10-CM

## 2012-02-13 DIAGNOSIS — I509 Heart failure, unspecified: Secondary | ICD-10-CM

## 2012-02-17 ENCOUNTER — Other Ambulatory Visit (INDEPENDENT_AMBULATORY_CARE_PROVIDER_SITE_OTHER): Payer: Medicare Other

## 2012-02-17 DIAGNOSIS — I509 Heart failure, unspecified: Secondary | ICD-10-CM

## 2012-02-17 DIAGNOSIS — Z01812 Encounter for preprocedural laboratory examination: Secondary | ICD-10-CM

## 2012-02-17 DIAGNOSIS — I472 Ventricular tachycardia, unspecified: Secondary | ICD-10-CM

## 2012-02-17 DIAGNOSIS — I4729 Other ventricular tachycardia: Secondary | ICD-10-CM

## 2012-02-17 LAB — CBC WITH DIFFERENTIAL/PLATELET
Basophils Absolute: 0 10*3/uL (ref 0.0–0.1)
Eosinophils Absolute: 0.2 10*3/uL (ref 0.0–0.7)
Lymphocytes Relative: 26.8 % (ref 12.0–46.0)
MCHC: 32.5 g/dL (ref 30.0–36.0)
Neutro Abs: 4.5 10*3/uL (ref 1.4–7.7)
Neutrophils Relative %: 60 % (ref 43.0–77.0)
Platelets: 205 10*3/uL (ref 150.0–400.0)
RDW: 17.7 % — ABNORMAL HIGH (ref 11.5–14.6)

## 2012-02-17 LAB — BASIC METABOLIC PANEL
BUN: 14 mg/dL (ref 6–23)
CO2: 27 mEq/L (ref 19–32)
Calcium: 9.5 mg/dL (ref 8.4–10.5)
Creatinine, Ser: 1 mg/dL (ref 0.4–1.5)
GFR: 80.32 mL/min (ref >60.00)
Glucose, Bld: 84 mg/dL (ref 70–99)

## 2012-02-19 ENCOUNTER — Encounter: Payer: Self-pay | Admitting: Internal Medicine

## 2012-02-20 ENCOUNTER — Ambulatory Visit (INDEPENDENT_AMBULATORY_CARE_PROVIDER_SITE_OTHER): Payer: Medicare Other | Admitting: Internal Medicine

## 2012-02-20 ENCOUNTER — Encounter: Payer: Self-pay | Admitting: Internal Medicine

## 2012-02-20 VITALS — BP 122/74 | HR 60 | Temp 97.8°F | Ht 70.0 in | Wt 201.0 lb

## 2012-02-20 DIAGNOSIS — B376 Candidal endocarditis: Secondary | ICD-10-CM

## 2012-02-20 MED ORDER — FLUCONAZOLE 200 MG PO TABS: 400.0000 mg | ORAL_TABLET | Freq: Every day | ORAL | Status: DC

## 2012-02-20 NOTE — Progress Notes (Addendum)
Patient ID: [name], male   DOB: Dec 07, 1945, 66 y.o.   MRN: [id number]    [facility]  Patient Active Problem List  Diagnosis  . Chronic systolic congestive heart failure  . LBBB  . ICD - IN SITU  . CAD (coronary artery disease)  . Normocytic anemia  . Esophageal stricture  . Multiple pulmonary nodules  . Acute respiratory failure with hypoxia  . Candidal endocarditis  . Cotton wool spots  . Pleural effusion  . Malnutrition  . V-tach  . Pulmonary embolism, septic  . DVT (deep venous thrombosis)  . Fever  . Hemoptysis  . HCAP (healthcare-associated pneumonia)    Patient's Medications  New Prescriptions   No medications on file  Previous Medications   ASPIRIN-SALICYLAMIDE-CAFFEINE (BC HEADACHE POWDER PO)    Take 1 Package by mouth daily.    CLONAZEPAM (KLONOPIN) 1 MG TABLET    Take 1 mg by mouth at bedtime.    ESOMEPRAZOLE (NEXIUM) 40 MG CAPSULE    Take 40 mg by mouth daily before breakfast.    IBUPROFEN (ADVIL,MOTRIN) 200 MG TABLET    Take 400 mg by mouth every 6 (six) hours as needed. For pain   METOPROLOL (LOPRESSOR) 100 MG TABLET    Take 100 mg by mouth daily.   MUPIROCIN CALCIUM (BACTROBAN NASAL NA)    Place 1 spray into the nose 2 (two) times daily.   OVER THE COUNTER MEDICATION    Place 1 application into the nose at bedtime. ARY nasal gel For nasal dryness   PSEUDOEPHED-APAP-GUAIFENESIN (TYLENOL SINUS SEVERE CONGEST PO)    Take 2 tablets by mouth daily as needed. Congestion  Modified Medications   Modified Medication Previous Medication   FLUCONAZOLE (DIFLUCAN) 200 MG TABLET fluconazole (DIFLUCAN) 200 MG tablet      Take 2 tablets (400 mg total) by mouth daily. Started 01-06-12 started before procedure    Take 200 mg by mouth 2 (two) times daily. Started 01-06-12 started before procedure  Discontinued Medications   No medications on file    Subjective: [name] is in for his routine followup visit. At that time of his last  visit with me in late October he stopped his fluconazole. However Dr. Ladona Ridgel was able to convince him to restart on November 6  In anticipation of reimplantation of a permanent pacemaker on November 25. He has been taking 400 mg daily without any difficulties. He has stable dyspnea on exertion. He has not had any fever, cough or further episodes of hemoptysis.  Objective: Temp: 97.8 F (36.6 C) (11/21 1344) Temp src: Oral (11/21 1344) BP: 122/74 mmHg (11/21 1344) Pulse Rate: 60  (11/21 1344)  General: he is cheerful and in good spirits Skin: no rash or splinter hemorrhages Lungs: he continues to have some decreased breath sounds and scattered crackles in the right lung base posteriorly Cor: regular S1-S2 no murmurs    Assessment: I certainly agree with continuing high-dose fluconazole for his very complicated Candida endocarditis complicated by septic pulmonary emboli. His followup CT scan last month showed some improvement in the right lung base but also new areas of nodular opacity. It is unclear to me if this represents persistent candidal pneumonia. I recommend repeat chest CT scan in 3 months.  I encouraged him to get an influenza vaccine today but he states that he has always developed "flu"after vaccination and has not taken the vaccine and many years. I have a long conversation with him about this  misperception and told him that if he were to get true influenza he would be at high risk for serious complications. He stated that he would consider it after he has gotten through his permanent pacemaker reimplantation.  Plan: 1. Continue high-dose fluconazole 2. Reimplantation of his permanent pacemaker on November 25 3. Followup here in 3 months   [name], MD [facility] [phone] pager   [phone] cell 02/20/2012, 2:02 PM

## 2012-02-23 MED ORDER — SODIUM CHLORIDE 0.9 % IR SOLN
80.0000 mg | Status: DC
  Filled 2012-02-23: qty 2

## 2012-02-23 MED ORDER — CEFAZOLIN SODIUM-DEXTROSE 2-3 GM-% IV SOLR
2.0000 g | INTRAVENOUS | Status: DC
  Filled 2012-02-23 (×2): qty 50

## 2012-02-24 ENCOUNTER — Encounter (HOSPITAL_COMMUNITY)
Admission: RE | Disposition: A | Discharge status: Home / Self Care | Payer: Self-pay | Source: Ambulatory Visit | Attending: Internal Medicine

## 2012-02-24 ENCOUNTER — Encounter (HOSPITAL_COMMUNITY): Payer: Self-pay | Admitting: General Practice

## 2012-02-24 ENCOUNTER — Ambulatory Visit (HOSPITAL_COMMUNITY)
Admission: RE | Admit: 2012-02-24 | Discharge: 2012-02-25 | Disposition: A | Discharge status: Home / Self Care | Payer: Medicare Other | Source: Ambulatory Visit | Attending: Internal Medicine | Admitting: Internal Medicine

## 2012-02-24 DIAGNOSIS — I472 Ventricular tachycardia, unspecified: Secondary | ICD-10-CM

## 2012-02-24 DIAGNOSIS — B376 Candidal endocarditis: Secondary | ICD-10-CM

## 2012-02-24 DIAGNOSIS — K219 Gastro-esophageal reflux disease without esophagitis: Secondary | ICD-10-CM | POA: Insufficient documentation

## 2012-02-24 DIAGNOSIS — E785 Hyperlipidemia, unspecified: Secondary | ICD-10-CM | POA: Insufficient documentation

## 2012-02-24 DIAGNOSIS — I2589 Other forms of chronic ischemic heart disease: Secondary | ICD-10-CM | POA: Insufficient documentation

## 2012-02-24 DIAGNOSIS — I2690 Septic pulmonary embolism without acute cor pulmonale: Secondary | ICD-10-CM

## 2012-02-24 DIAGNOSIS — E46 Unspecified protein-calorie malnutrition: Secondary | ICD-10-CM

## 2012-02-24 DIAGNOSIS — Z95810 Presence of automatic (implantable) cardiac defibrillator: Secondary | ICD-10-CM

## 2012-02-24 DIAGNOSIS — I2510 Atherosclerotic heart disease of native coronary artery without angina pectoris: Secondary | ICD-10-CM

## 2012-02-24 DIAGNOSIS — I10 Essential (primary) hypertension: Secondary | ICD-10-CM | POA: Insufficient documentation

## 2012-02-24 DIAGNOSIS — J90 Pleural effusion, not elsewhere classified: Secondary | ICD-10-CM

## 2012-02-24 DIAGNOSIS — R509 Fever, unspecified: Secondary | ICD-10-CM

## 2012-02-24 DIAGNOSIS — R042 Hemoptysis: Secondary | ICD-10-CM

## 2012-02-24 DIAGNOSIS — I5022 Chronic systolic (congestive) heart failure: Secondary | ICD-10-CM

## 2012-02-24 DIAGNOSIS — I252 Old myocardial infarction: Secondary | ICD-10-CM | POA: Insufficient documentation

## 2012-02-24 DIAGNOSIS — J9601 Acute respiratory failure with hypoxia: Secondary | ICD-10-CM

## 2012-02-24 DIAGNOSIS — I447 Left bundle-branch block, unspecified: Secondary | ICD-10-CM | POA: Insufficient documentation

## 2012-02-24 DIAGNOSIS — Z79899 Other long term (current) drug therapy: Secondary | ICD-10-CM | POA: Insufficient documentation

## 2012-02-24 DIAGNOSIS — H3581 Retinal edema: Secondary | ICD-10-CM

## 2012-02-24 DIAGNOSIS — J189 Pneumonia, unspecified organism: Secondary | ICD-10-CM

## 2012-02-24 DIAGNOSIS — I509 Heart failure, unspecified: Secondary | ICD-10-CM

## 2012-02-24 DIAGNOSIS — R918 Other nonspecific abnormal finding of lung field: Secondary | ICD-10-CM

## 2012-02-24 DIAGNOSIS — D649 Anemia, unspecified: Secondary | ICD-10-CM

## 2012-02-24 DIAGNOSIS — K222 Esophageal obstruction: Secondary | ICD-10-CM

## 2012-02-24 DIAGNOSIS — I82409 Acute embolism and thrombosis of unspecified deep veins of unspecified lower extremity: Secondary | ICD-10-CM

## 2012-02-24 HISTORY — DX: Unspecified chronic bronchitis: J42

## 2012-02-24 HISTORY — DX: Residual hemorrhoidal skin tags: K64.4

## 2012-02-24 HISTORY — DX: Pneumonia, unspecified organism: J18.9

## 2012-02-24 HISTORY — DX: Angina pectoris, unspecified: I20.9

## 2012-02-24 HISTORY — DX: Anemia, unspecified: D64.9

## 2012-02-24 HISTORY — DX: Rheumatoid arthritis, unspecified: M06.9

## 2012-02-24 HISTORY — DX: Acute embolism and thrombosis of unspecified deep veins of unspecified lower extremity: I82.409

## 2012-02-24 HISTORY — DX: Other pulmonary embolism without acute cor pulmonale: I26.99

## 2012-02-24 HISTORY — PX: BI-VENTRICULAR PACEMAKER INSERTION: SHX5462

## 2012-02-24 LAB — SURGICAL PCR SCREEN: Staphylococcus aureus: NEGATIVE

## 2012-02-24 SURGERY — BI-VENTRICULAR PACEMAKER INSERTION (CRT-P)
Anesthesia: LOCAL

## 2012-02-24 MED ORDER — SODIUM CHLORIDE 0.9 % IJ SOLN: 3.0000 mL | INTRAMUSCULAR | Status: DC | PRN

## 2012-02-24 MED ORDER — ONDANSETRON HCL 4 MG/2ML IJ SOLN: 4.0000 mg | Freq: Four times a day (QID) | INTRAMUSCULAR | Status: DC | PRN

## 2012-02-24 MED ORDER — PANTOPRAZOLE SODIUM 40 MG PO TBEC
40.0000 mg | DELAYED_RELEASE_TABLET | Freq: Every day | ORAL | Status: DC
  Filled 2012-02-24 (×2): qty 1

## 2012-02-24 MED ORDER — LIDOCAINE HCL (PF) 1 % IJ SOLN
INTRAMUSCULAR | Status: AC
  Filled 2012-02-24: qty 60

## 2012-02-24 MED ORDER — MIDAZOLAM HCL 2 MG/2ML IJ SOLN
INTRAMUSCULAR | Status: AC
  Filled 2012-02-24: qty 2

## 2012-02-24 MED ORDER — MUPIROCIN 2 % EX OINT
TOPICAL_OINTMENT | CUTANEOUS | Status: AC
  Administered 2012-02-24: 1 via NASAL
  Filled 2012-02-24: qty 22

## 2012-02-24 MED ORDER — ACETAMINOPHEN 325 MG PO TABS
325.0000 mg | ORAL_TABLET | ORAL | Status: DC | PRN
  Administered 2012-02-24 – 2012-02-25 (×2): 650 mg via ORAL
  Filled 2012-02-24 (×2): qty 2

## 2012-02-24 MED ORDER — CHLORHEXIDINE GLUCONATE 4 % EX LIQD
60.0000 mL | Freq: Once | CUTANEOUS | Status: DC
  Filled 2012-02-24: qty 60

## 2012-02-24 MED ORDER — CEFAZOLIN SODIUM-DEXTROSE 2-3 GM-% IV SOLR
2.0000 g | Freq: Four times a day (QID) | INTRAVENOUS | Status: AC
  Administered 2012-02-24 – 2012-02-25 (×3): 2 g via INTRAVENOUS
  Filled 2012-02-24 (×4): qty 50

## 2012-02-24 MED ORDER — VERAPAMIL HCL 2.5 MG/ML IV SOLN
INTRAVENOUS | Status: AC
  Filled 2012-02-24: qty 2

## 2012-02-24 MED ORDER — FENTANYL CITRATE 0.05 MG/ML IJ SOLN
INTRAMUSCULAR | Status: AC
  Filled 2012-02-24: qty 2

## 2012-02-24 MED ORDER — MUPIROCIN CALCIUM 2 % NA OINT: TOPICAL_OINTMENT | Freq: Two times a day (BID) | NASAL | Status: DC

## 2012-02-24 MED ORDER — METOPROLOL TARTRATE 100 MG PO TABS
100.0000 mg | ORAL_TABLET | Freq: Every day | ORAL | Status: DC
  Filled 2012-02-24: qty 1

## 2012-02-24 MED ORDER — SODIUM CHLORIDE 0.9 % IV SOLN: 250.0000 mL | INTRAVENOUS | Status: DC

## 2012-02-24 MED ORDER — FLUCONAZOLE 200 MG PO TABS
400.0000 mg | ORAL_TABLET | Freq: Every day | ORAL | Status: DC
  Filled 2012-02-24: qty 2

## 2012-02-24 MED ORDER — SODIUM CHLORIDE 0.9 % IJ SOLN: 3.0000 mL | Freq: Two times a day (BID) | INTRAMUSCULAR | Status: DC

## 2012-02-24 MED ORDER — MUPIROCIN 2 % EX OINT
TOPICAL_OINTMENT | Freq: Two times a day (BID) | CUTANEOUS | Status: DC
  Administered 2012-02-24: 1 via NASAL
  Filled 2012-02-24: qty 22

## 2012-02-24 MED ORDER — YOU HAVE A PACEMAKER BOOK
Freq: Once | Status: DC
  Filled 2012-02-24: qty 1

## 2012-02-24 MED ORDER — SODIUM CHLORIDE 0.45 % IV SOLN
INTRAVENOUS | Status: DC
  Administered 2012-02-24: 12:00:00 via INTRAVENOUS

## 2012-02-24 MED ORDER — CLONAZEPAM 0.5 MG PO TABS
1.0000 mg | ORAL_TABLET | Freq: Every day | ORAL | Status: DC
  Administered 2012-02-24: 1 mg via ORAL
  Filled 2012-02-24 (×2): qty 1
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
## 2012-02-24 NOTE — Interval H&P Note (Signed)
History and Physical Interval Note:  02/24/2012 12:00 PM  [name]  has presented today for surgery, with the diagnosis of VTach  The various methods of treatment have been discussed with the patient and family. After consideration of risks, benefits and other options for treatment, the patient has consented to  Procedure(s) (LRB) with comments: BI-VENTRICULAR PACEMAKER INSERTION (CRT-P) (N/A) as a surgical intervention .  The patient's history has been reviewed, patient examined, no change in status, stable for surgery.  I have reviewed the patient's chart and labs.  Questions were answered to the patient's satisfaction.     Lewayne Bunting

## 2012-02-25 ENCOUNTER — Ambulatory Visit (HOSPITAL_COMMUNITY): Payer: Medicare Other

## 2012-02-25 NOTE — Discharge Summary (Signed)
ELECTROPHYSIOLOGY DISCHARGE SUMMARY    Patient ID: [name],  MRN: [id number], DOB/AGE: July 07, 1945 66 y.o.  Admit date: 02/24/2012 Discharge date: 02/25/2012  Primary Care Physician: Judge Stall, MD Primary Cardiologist: Elease Hashimoto, MD  Primary Discharge Diagnosis:  1. ICM s/p BiV PPM implantation 02/24/2012 2. LBBB 3. Chronic systolic HF  Secondary Discharge Diagnoses:  1. Recent endocarditis s/p ICD system extraction May 2013, complicated by septic emboli 2. HTN 3. Dyslipidemia 4. CAD s/p CABG 5. GERD  Procedures This Admission:  1. BiV PPM implantation  Medtronic model 5076 52-cm active fixation pacing lead, serial number [id number] was advanced into the right ventricle and the Medtronic model 5076 45-cm active fixation pacing lead, serial number [id number] was advanced into the right atrium. Medtronic model [id number] 88-cm bipolar pacing lead, serial number [id number] was advanced over the guidewire into the lateral wall of the left ventricle. Medtronic BiV PPM serial # [id number].  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 He underwent system extraction and a long course of fluconazole. He has had no recurrent fevers or chills and is on chronic suppressive fluconazole dosing. He was seen in follow-up by Dr. Ladona Ridgel on 02/05/2012 and scheduled for insertion of a biventricular pacemaker which was done yesterday 02/24/2012. [name] tolerated this procedure well without any immediate complication. He remains hemodynamically stable and afebrile. His chest xray shows stable lead placement without pneumothorax. His device interrogation has been reviewed by Dr. Ladona Ridgel and shows normal BiV ICD function with stable lead parameters/measurements. His implant site is intact without significant bleeding or hematoma. He has been given  discharge instructions including wound care and activity restrictions. He will follow-up in 10 days for wound check. There were no changes made to his medications. He has been seen, examined and deemed stable for discharge today by Dr. Lewayne Bunting.  Physical Exam: Vitals: Blood pressure 124/65, pulse 66, temperature 97.8 F (36.6 C), temperature source Oral, resp. rate 21, height 5\' 10"  (1.778 m), weight 197 lb 8.5 oz (89.6 kg), SpO2 91.00%.  General: Well developed, well appearing 66 year old male in no acute distress. Heart: RRR. S1, S2 present without murmur, rub or gallop. Lungs: CTA bilaterally. No wheezes, rales or rhonchi. Abdomen: Soft, nondistended. Extremities: No cyanosis, clubbing or edema. Neuro: Alert and oriented x 3. No focal deficits. Skin: Right upper chest/implant site intact without erythema, edema, bleeding or hematoma.  Labs: Lab Results  Component Value Date   WBC 7.5 02/17/2012   HGB 13.2 02/17/2012   HCT 40.7 02/17/2012   MCV 87.6 02/17/2012   PLT 205.0 02/17/2012    Disposition:  The patient is being discharged in stable condition.  Follow-up: Follow-up Information    Follow up with [name]., MD. On 04/17/2012. (At 2:15 PM)    Contact information:   [address] [place] [address] [phone]       Follow up with Lewayne Bunting, MD. On 05/27/2012. (At 9:30 AM)    Contact information:   [address] [place] [address] [phone]       Follow up with [facility] On 03/05/2012. (At 3:00 PM for wound check)    Contact information:   [address] [place] [address] [phone]        Discharge Medications:    Medication  List     As of 02/25/2012  8:25 AM    STOP taking these medications         BC HEADACHE POWDER PO      TAKE these medications         BACTROBAN NASAL NA   Place 1 spray into the nose 2 (two) times daily.      clonazePAM 1 MG tablet     Commonly known as: KLONOPIN   Take 1 mg by mouth at bedtime.      esomeprazole 40 MG capsule   Commonly known as: NEXIUM   Take 40 mg by mouth daily before breakfast.      fluconazole 200 MG tablet   Commonly known as: DIFLUCAN   Take 2 tablets (400 mg total) by mouth daily. Started 01-06-12 started before procedure      ibuprofen 200 MG tablet   Commonly known as: ADVIL,MOTRIN   Take 400 mg by mouth every 6 (six) hours as needed. For pain      metoprolol 100 MG tablet   Commonly known as: LOPRESSOR   Take 100 mg by mouth daily.      OVER THE COUNTER MEDICATION   Place 1 application into the nose at bedtime. ARY nasal gel  For nasal dryness      TYLENOL SINUS SEVERE CONGEST PO   Take 2 tablets by mouth daily as needed. Congestion      Duration of Discharge Encounter: Greater than 30 minutes including physician time.  Signed, Rick Duff, PA-C 02/25/2012, 8:25 AM   EP Attending  Patient seen and examined. I agree with the above exam, assessment and plan.   Leonia Reeves.D.

## 2012-02-25 NOTE — Op Note (Signed)
NAMECRISTHIAN, [name] NO.:  [id number]  MEDICAL RECORD NO.:  [id number]  LOCATION:  6526                         FACILITY:  [facility]  PHYSICIAN:  Doylene Canning. Ladona Ridgel, MD    DATE OF BIRTH:  10/10/1945  DATE OF PROCEDURE:  02/24/2012 DATE OF DISCHARGE:                              OPERATIVE REPORT   PROCEDURE PERFORMED:  Insertion of a biventricular pacemaker.  INDICATION:  Chronic class III systolic heart failure in the setting of left bundle-branch block.  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  He underwent extraction and a long course of fluconazole.  He has had no recurrent fevers or chills and is on chronic suppressive fluconazole dosing and is now referred for insertion of a biventricular pacemaker.  PROCEDURE:  After informed consent was obtained, the patient was taken to the Diagnostic EP Lab in the fasting state.  After usual preparation and draping, intravenous fentanyl and midazolam was given for sedation. A 30 mL of lidocaine was infiltrated into the right infraclavicular region.  A 7-cm incision was carried out over this region. Electrocautery was utilized to dissect down to the fascial plane.  The right subclavian vein was punctured x3.  The Medtronic model 5076 52-cm active fixation pacing lead, serial number [id number] was advanced into the right ventricle and the Medtronic model 5076 45-cm active fixation pacing lead, serial number [id number] was advanced into the right atrium.  Mapping was carried out in the right ventricle.  At the final site on the RV septum, the R-waves were 15 and impedance was 1000 ohms and the threshold of 1 V at 0.5 milliseconds.  The 10-V pacing did not stimulate the diaphragm and there was large injury current.  With the ventricular lead in satisfactory position, attention was then turned to placement of the atrial lead, which was  placed in the anterolateral portion of the right atrium.  Initially, the P-waves were 3.  There was remained a large injury current and they dropped to 1.5.  The threshold was 1.1 V at 0.5 milliseconds and the impedance was 641 ohms.  Again, 10- V pacing did not stimulate the diaphragm and again, there was a large injury current.  With these satisfactory parameters, attention was then turned to placement of the left ventricular lead.  The coronary sinus guiding catheter was inserted into the coronary sinus by way of the right atrium with modest amounts of difficulty.  Venography of the coronary sinus was then carried out.  This demonstrated a large lateral vein, which had previously been utilized for LV lead placement.  The vein was fairly easily cannulated.  The Mailman 0.014 guidewire was utilized to cannulate the vein.  The Medtronic model 4194 88-cm bipolar pacing lead, serial number [id number] was advanced over the guidewire into the lateral wall of the left ventricle.  Mapping was carried out from the base to the apex.  Along the entire course of the lateral wall of the left ventricle in the lateral vein, pacing thresholds were high. In some locations, there was also diaphragmatic stimulation. Ultimately, we chose the best site for pacing from a physiologic perspective at  a location approximately one-third from base to apex.  In this location, the threshold was less than 4 V at 0.8 milliseconds. Diaphragmatic stimulation was not present at 5 V at 0.8 milliseconds. With these suboptimal, but still satisfactory parameters and with no other options for LV pacing, the lead was liberated from the guiding catheter in the usual manner.  The leads were then secured to the subpectoral fascia with a figure-of-eight silk suture and the sewing sleeves were also secured with silk suture.  Electrocautery was then utilized to make a subcutaneous pocket.  The pocket was irrigated with antibiotic  irrigation.  Electrocautery was utilized to assure hemostasis.  The Medtronic biventricular pacemaker was then connected to the atrial RV and LV leads and placed back in the subcutaneous pocket. The pocket was irrigated with antibiotic irrigation and the incision was closed with 2-0 and 3-0 Vicryl.  Benzoin and Steri-Strips were painted on the skin, a pressure dressing was applied, and the patient was returned to his room in satisfactory condition.  COMPLICATIONS:  There were no immediate procedure complications.  RESULTS:  This demonstrate successful implantation of a right-sided biventricular pacemaker in a patient with an ischemic cardiomyopathy, left bundle-branch block, and chronic systolic heart failure.  Of note, the patient opted not to have a biventricular ICD in place for his preference.     Doylene Canning. Ladona Ridgel, MD     GWT/MEDQ  D:  02/24/2012  T:  02/25/2012  Job:  [id number]  cc:   Vesta Mixer, M.D.

## 2012-03-03 ENCOUNTER — Encounter: Payer: Self-pay | Admitting: *Deleted

## 2012-03-03 DIAGNOSIS — Z950 Presence of cardiac pacemaker: Secondary | ICD-10-CM | POA: Insufficient documentation

## 2012-03-05 ENCOUNTER — Encounter: Payer: Self-pay | Admitting: Internal Medicine

## 2012-03-05 ENCOUNTER — Ambulatory Visit (INDEPENDENT_AMBULATORY_CARE_PROVIDER_SITE_OTHER): Payer: Medicare Other | Admitting: *Deleted

## 2012-03-05 DIAGNOSIS — I4729 Other ventricular tachycardia: Secondary | ICD-10-CM

## 2012-03-05 DIAGNOSIS — Z950 Presence of cardiac pacemaker: Secondary | ICD-10-CM

## 2012-03-05 DIAGNOSIS — I472 Ventricular tachycardia, unspecified: Secondary | ICD-10-CM

## 2012-03-05 LAB — PACEMAKER DEVICE OBSERVATION
AL IMPEDENCE PM: 513 Ohm
BAMS-0001: 170 {beats}/min
BATTERY VOLTAGE: 3.0354 V
RV LEAD AMPLITUDE: 7.9 mv
RV LEAD IMPEDENCE PM: 513 Ohm

## 2012-03-05 NOTE — Progress Notes (Signed)
Pacer check in clinic  

## 2012-04-03 ENCOUNTER — Telehealth: Payer: Self-pay | Admitting: Internal Medicine

## 2012-04-03 NOTE — Telephone Encounter (Signed)
Spoke w/Alyssa at [facility]. Information was given in regards to new implant.

## 2012-04-03 NOTE — Telephone Encounter (Signed)
New Problem:    Called in wanting to know if the patient's device was explanted when his new device was implanted.  Please call back.

## 2012-04-17 ENCOUNTER — Ambulatory Visit (INDEPENDENT_AMBULATORY_CARE_PROVIDER_SITE_OTHER): Payer: Medicare Other | Admitting: Cardiovascular Disease

## 2012-04-17 ENCOUNTER — Encounter: Payer: Self-pay | Admitting: Cardiovascular Disease

## 2012-04-17 VITALS — BP 120/80 | HR 63 | Ht 70.0 in | Wt 201.0 lb

## 2012-04-17 DIAGNOSIS — R062 Wheezing: Secondary | ICD-10-CM

## 2012-04-17 DIAGNOSIS — I5022 Chronic systolic (congestive) heart failure: Secondary | ICD-10-CM

## 2012-04-17 DIAGNOSIS — I509 Heart failure, unspecified: Secondary | ICD-10-CM

## 2012-04-17 DIAGNOSIS — J189 Pneumonia, unspecified organism: Secondary | ICD-10-CM

## 2012-04-17 DIAGNOSIS — I2510 Atherosclerotic heart disease of native coronary artery without angina pectoris: Secondary | ICD-10-CM

## 2012-04-17 LAB — CBC WITH DIFFERENTIAL/PLATELET
Basophils Relative: 0.4 % (ref 0.0–3.0)
Eosinophils Absolute: 0.2 10*3/uL (ref 0.0–0.7)
Hemoglobin: 14.4 g/dL (ref 13.0–17.0)
MCHC: 34.2 g/dL (ref 30.0–36.0)
MCV: 87.7 fl (ref 78.0–100.0)
Monocytes Absolute: 0.8 10*3/uL (ref 0.1–1.0)
Neutro Abs: 2.7 10*3/uL (ref 1.4–7.7)
RBC: 4.79 Mil/uL (ref 4.22–5.81)
RDW: 16 % — ABNORMAL HIGH (ref 11.5–14.6)

## 2012-04-17 LAB — BASIC METABOLIC PANEL
CO2: 27 mEq/L (ref 19–32)
Chloride: 104 mEq/L (ref 96–112)
Creatinine, Ser: 1.3 mg/dL (ref 0.4–1.5)
Glucose, Bld: 116 mg/dL — ABNORMAL HIGH (ref 70–99)
Sodium: 138 mEq/L (ref 135–145)

## 2012-04-17 MED ORDER — ALBUTEROL SULFATE HFA 108 (90 BASE) MCG/ACT IN AERS: 2.0000 | INHALATION_SPRAY | Freq: Four times a day (QID) | RESPIRATORY_TRACT | Status: DC | PRN

## 2012-04-17 NOTE — Assessment & Plan Note (Signed)
Has a history of pneumonia. He has very coarse rhonchi and wheezes in his right lung . We'll get CBC with differential.   I have referred him to his medical Dr. to get a chest x-ray and for further evaluation.

## 2012-04-17 NOTE — Assessment & Plan Note (Addendum)
[name] Is doing well from a heart standpoint. His energy levels are almost back to what they were before his episode of endocarditis.  We'll continue his current medications.  Meds on low-dose beta blocker. He does not tolerate ACE inhibitor or ARB.  He has coarse rhonchi and wheezes in his right lower lobe. We'll give him a prescription for albuterol. We'll check a CBC with differential. I've asked him to see his medical doctor for further evaluation of this.

## 2012-04-17 NOTE — Assessment & Plan Note (Signed)
He's not having episodes of angina. Continue with the same medications.
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
## 2012-04-17 NOTE — Patient Instructions (Addendum)
Your physician recommends that you return for lab work in: Today (CBCw/ Diff, BMP)  Your physician wants you to follow-up in: 6 Months You will receive a reminder letter in the mail two months in advance. If you don't receive a letter, please call our office to schedule the follow-up appointment.  Your physician has recommended you make the following change in your medication:   START ALBUTEROL INHALER 2 PUFFS EVERY 6 HOURS AS NEEDED

## 2012-05-16 ENCOUNTER — Other Ambulatory Visit: Payer: Self-pay

## 2012-05-26 ENCOUNTER — Other Ambulatory Visit: Payer: Self-pay | Admitting: Cardiovascular Disease

## 2012-05-27 ENCOUNTER — Encounter: Payer: Medicare Other | Admitting: Internal Medicine

## 2012-05-27 MED ORDER — METOPROLOL TARTRATE 100 MG PO TABS: 100.0000 mg | ORAL_TABLET | Freq: Every day | ORAL | Status: DC

## 2012-05-27 NOTE — Telephone Encounter (Signed)
Refill completed.

## 2012-06-02 ENCOUNTER — Encounter: Payer: Self-pay | Admitting: Internal Medicine

## 2012-06-02 ENCOUNTER — Ambulatory Visit (INDEPENDENT_AMBULATORY_CARE_PROVIDER_SITE_OTHER): Payer: Medicare Other | Admitting: Internal Medicine

## 2012-06-02 VITALS — BP 135/81 | HR 59 | Ht 70.0 in | Wt 202.0 lb

## 2012-06-02 VITALS — BP 133/83 | HR 60 | Temp 98.3°F | Ht 70.0 in | Wt 201.5 lb

## 2012-06-02 DIAGNOSIS — I4729 Other ventricular tachycardia: Secondary | ICD-10-CM

## 2012-06-02 DIAGNOSIS — B376 Candidal endocarditis: Secondary | ICD-10-CM

## 2012-06-02 DIAGNOSIS — I472 Ventricular tachycardia, unspecified: Secondary | ICD-10-CM

## 2012-06-02 DIAGNOSIS — I509 Heart failure, unspecified: Secondary | ICD-10-CM

## 2012-06-02 DIAGNOSIS — Z950 Presence of cardiac pacemaker: Secondary | ICD-10-CM

## 2012-06-02 DIAGNOSIS — I5022 Chronic systolic (congestive) heart failure: Secondary | ICD-10-CM

## 2012-06-02 LAB — PACEMAKER DEVICE OBSERVATION
AL IMPEDENCE PM: 570 Ohm
ATRIAL PACING PM: 6.95
BATTERY VOLTAGE: 3.0109 V
RV LEAD IMPEDENCE PM: 589 Ohm
VENTRICULAR PACING PM: 99.61

## 2012-06-02 NOTE — Progress Notes (Signed)
Patient ID: [name], male   DOB: 07-Jul-1945, 67 y.o.   MRN: [id number]         [facility]  Patient Active Problem List  Diagnosis  . Chronic systolic congestive heart failure  . LBBB  . CAD (coronary artery disease)  . Normocytic anemia  . Esophageal stricture  . Multiple pulmonary nodules  . Acute respiratory failure with hypoxia  . Candidal endocarditis  . Cotton wool spots  . Pleural effusion  . Malnutrition  . V-tach  . Pulmonary embolism, septic  . DVT (deep venous thrombosis)  . Fever  . Hemoptysis  . HCAP (healthcare-associated pneumonia)  . Pacemaker-Medtronic    Patient's Medications  New Prescriptions   No medications on file  Previous Medications   ALBUTEROL (PROVENTIL HFA;VENTOLIN HFA) 108 (90 BASE) MCG/ACT INHALER    Inhale 2 puffs into the lungs every 6 (six) hours as needed for wheezing.   ASPIRIN-SALICYLAMIDE-CAFFEINE (BC HEADACHE POWDER PO)    Take by mouth.   CLONAZEPAM (KLONOPIN) 1 MG TABLET    Take 1 mg by mouth at bedtime.    FLUCONAZOLE (DIFLUCAN) 200 MG TABLET    Take 2 tablets (400 mg total) by mouth daily. Started 01-06-12 started before procedure   FUROSEMIDE (LASIX) 20 MG TABLET    Take 1 tablet daily   METOPROLOL (LOPRESSOR) 100 MG TABLET    Take 100 mg by mouth daily.   PSEUDOEPHED-APAP-GUAIFENESIN (TYLENOL SINUS SEVERE CONGEST PO)    Take 2 tablets by mouth daily as needed. Congestion  Modified Medications   No medications on file  Discontinued Medications   OVER THE COUNTER MEDICATION    Place 1 application into the nose at bedtime. ARY nasal gel For nasal dryness    Subjective: [name] is in for his routine visit. He has new pacemaker placed without incident and is feeling much better. He is tolerating his fluconazole well. He had shingles about a month ago on his right chest but it is resolving he has no residual pain. A few days ago he did not rash on his right wrist where he had the previous  skin graft. It does not itch. It seems to be getting a little bit better.  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 His old pacemaker was removed and he now has a new pacemaker. Given the high risk of relapse is generally recommended that lifelong suppressive therapy with fluconazole be continued. He is comfortable with this plan.  I'm not sure what is causing his focal left wrist rash. It seems to be starting to fade. I would simply observe and see if it resolves on its own.  Plan: 1. Continue fluconazole indefinitely 2. Followup in 4 months   [name], MD [facility] [phone] pager   [phone] cell  06/02/2012, 1:56 PM
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
## 2012-06-02 NOTE — Patient Instructions (Addendum)
Your physician wants you to follow-up in: Nov with Dr Taylor You will receive a reminder letter in the mail two months in advance. If you don't receive a letter, please call our office to schedule the follow-up appointment.  

## 2012-06-02 NOTE — Assessment & Plan Note (Signed)
His heart failure symptoms are class 2-3. I've encouraged the patient to increase his physical activity. He will continue his current medical therapy. He is instructed to maintain a low-sodium diet.

## 2012-06-02 NOTE — Assessment & Plan Note (Signed)
His Medtronic biventricular pacemaker is working normally. He is encouraged to maintain a low-sodium diet. His fluid index looks good. We'll plan to recheck in several months.

## 2012-06-17 ENCOUNTER — Other Ambulatory Visit: Payer: Self-pay | Admitting: *Deleted

## 2012-06-17 DIAGNOSIS — I509 Heart failure, unspecified: Secondary | ICD-10-CM

## 2012-06-17 DIAGNOSIS — I2510 Atherosclerotic heart disease of native coronary artery without angina pectoris: Secondary | ICD-10-CM

## 2012-06-17 DIAGNOSIS — R062 Wheezing: Secondary | ICD-10-CM

## 2012-06-17 MED ORDER — ALBUTEROL SULFATE HFA 108 (90 BASE) MCG/ACT IN AERS: 2.0000 | INHALATION_SPRAY | Freq: Four times a day (QID) | RESPIRATORY_TRACT | Status: AC | PRN

## 2012-06-17 NOTE — Telephone Encounter (Signed)
PER DR. Elease Hashimoto IT IS OK TO FILL PT'S ALBUTEROL. Fax Received. Refill Completed. Fidela Cieslak Chowoe (R.M.A)

## 2012-06-26 ENCOUNTER — Other Ambulatory Visit: Payer: Self-pay | Admitting: Internal Medicine

## 2012-06-26 DIAGNOSIS — B376 Candidal endocarditis: Secondary | ICD-10-CM

## 2012-10-13 ENCOUNTER — Ambulatory Visit: Payer: Medicare Other | Admitting: Internal Medicine

## 2012-10-20 ENCOUNTER — Ambulatory Visit
Admission: RE | Admit: 2012-10-20 | Discharge: 2012-10-20 | Disposition: A | Discharge status: Home / Self Care | Payer: Medicare Other | Source: Ambulatory Visit | Attending: Internal Medicine | Admitting: Internal Medicine

## 2012-10-20 ENCOUNTER — Ambulatory Visit (INDEPENDENT_AMBULATORY_CARE_PROVIDER_SITE_OTHER): Payer: Medicare Other | Admitting: Internal Medicine

## 2012-10-20 ENCOUNTER — Encounter: Payer: Self-pay | Admitting: Internal Medicine

## 2012-10-20 VITALS — BP 127/80 | HR 59 | Temp 97.9°F | Wt 208.0 lb

## 2012-10-20 DIAGNOSIS — R0609 Other forms of dyspnea: Secondary | ICD-10-CM

## 2012-10-20 DIAGNOSIS — R079 Chest pain, unspecified: Secondary | ICD-10-CM
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
## 2012-10-29 ENCOUNTER — Ambulatory Visit (INDEPENDENT_AMBULATORY_CARE_PROVIDER_SITE_OTHER): Payer: Medicare Other | Admitting: Cardiovascular Disease

## 2012-10-29 ENCOUNTER — Encounter: Payer: Self-pay | Admitting: Cardiovascular Disease

## 2012-10-29 VITALS — BP 124/80 | HR 62 | Ht 70.0 in | Wt 208.1 lb

## 2012-10-29 DIAGNOSIS — I5022 Chronic systolic (congestive) heart failure: Secondary | ICD-10-CM

## 2012-10-29 DIAGNOSIS — R079 Chest pain, unspecified: Secondary | ICD-10-CM

## 2012-10-29 DIAGNOSIS — I509 Heart failure, unspecified: Secondary | ICD-10-CM

## 2012-10-29 DIAGNOSIS — I2510 Atherosclerotic heart disease of native coronary artery without angina pectoris: Secondary | ICD-10-CM

## 2012-10-29 MED ORDER — LOSARTAN POTASSIUM 25 MG PO TABS: 25.0000 mg | ORAL_TABLET | Freq: Every day | ORAL | Status: DC

## 2012-10-29 NOTE — Patient Instructions (Addendum)
Your physician has requested that you have a lexiscan myoview.  Please follow instruction sheet, as given.  Your physician has recommended you make the following change in your medication:  Start losartan 25 mg daily  Your physician has requested that you have an echocardiogram in 3 months prior to 3 month ov . Echocardiography is a painless test that uses sound waves to create images of your heart. It provides your doctor with information about the size and shape of your heart and how well your heart's chambers and valves are working. This procedure takes approximately one hour. There are no restrictions for this procedure.   Your physician recommends that you schedule a follow-up appointment in: 3 months
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
## 2012-10-29 NOTE — Assessment & Plan Note (Signed)
[name]'s  ejection fraction has fallen from 30% to 15%. He's been resistant to taking an ACE inhibitor or ARB. I finally convinced him to take an ARB today. We'll start him on Cozaar 25 mg a day. We'll plan on adding spironolactone in the near future.

## 2012-10-29 NOTE — Assessment & Plan Note (Addendum)
[name] presents today with some unusual chest pains. His symptoms are fairly atypical but he is concerned that he may have a blockage. He has severe shortness of breath with exertion.  His left ventricle systolic function has fallen in the past year so. His EF is now 15 %  Like to proceed with a stress Myoview study for further evaluation.

## 2012-11-02 ENCOUNTER — Other Ambulatory Visit: Payer: Self-pay | Admitting: Internal Medicine

## 2012-11-02 DIAGNOSIS — B379 Candidiasis, unspecified: Secondary | ICD-10-CM

## 2012-11-04 ENCOUNTER — Other Ambulatory Visit: Payer: Self-pay

## 2012-11-11 ENCOUNTER — Ambulatory Visit (HOSPITAL_COMMUNITY): Payer: Medicare Other | Attending: Cardiovascular Disease | Admitting: Radiology

## 2012-11-11 VITALS — BP 114/72 | HR 63 | Ht 70.0 in | Wt 208.0 lb

## 2012-11-11 DIAGNOSIS — R5383 Other fatigue: Secondary | ICD-10-CM | POA: Insufficient documentation

## 2012-11-11 DIAGNOSIS — Z8249 Family history of ischemic heart disease and other diseases of the circulatory system: Secondary | ICD-10-CM | POA: Insufficient documentation

## 2012-11-11 DIAGNOSIS — R0609 Other forms of dyspnea: Secondary | ICD-10-CM | POA: Insufficient documentation

## 2012-11-11 DIAGNOSIS — R0602 Shortness of breath: Secondary | ICD-10-CM

## 2012-11-11 DIAGNOSIS — I447 Left bundle-branch block, unspecified: Secondary | ICD-10-CM | POA: Insufficient documentation

## 2012-11-11 DIAGNOSIS — R079 Chest pain, unspecified: Secondary | ICD-10-CM

## 2012-11-11 DIAGNOSIS — R0989 Other specified symptoms and signs involving the circulatory and respiratory systems: Secondary | ICD-10-CM | POA: Insufficient documentation

## 2012-11-11 DIAGNOSIS — I10 Essential (primary) hypertension: Secondary | ICD-10-CM | POA: Insufficient documentation

## 2012-11-11 DIAGNOSIS — Z87891 Personal history of nicotine dependence: Secondary | ICD-10-CM | POA: Insufficient documentation

## 2012-11-11 DIAGNOSIS — R42 Dizziness and giddiness: Secondary | ICD-10-CM | POA: Insufficient documentation

## 2012-11-11 DIAGNOSIS — R5381 Other malaise: Secondary | ICD-10-CM | POA: Insufficient documentation

## 2012-11-11 DIAGNOSIS — Z951 Presence of aortocoronary bypass graft: Secondary | ICD-10-CM | POA: Insufficient documentation

## 2012-11-11 DIAGNOSIS — I2510 Atherosclerotic heart disease of native coronary artery without angina pectoris: Secondary | ICD-10-CM

## 2012-11-11 DIAGNOSIS — R0789 Other chest pain: Secondary | ICD-10-CM | POA: Insufficient documentation

## 2012-11-11 MED ORDER — ADENOSINE (DIAGNOSTIC) 3 MG/ML IV SOLN
0.5600 mg/kg | Freq: Once | INTRAVENOUS | Status: AC
  Administered 2012-11-11: 52.8 mg via INTRAVENOUS

## 2012-11-11 MED ORDER — TECHNETIUM TC 99M SESTAMIBI GENERIC - CARDIOLITE
11.0000 | Freq: Once | INTRAVENOUS | Status: AC | PRN
  Administered 2012-11-11: 11 via INTRAVENOUS

## 2012-11-11 MED ORDER — TECHNETIUM TC 99M SESTAMIBI GENERIC - CARDIOLITE
33.0000 | Freq: Once | INTRAVENOUS | Status: AC | PRN
  Administered 2012-11-11: 33 via INTRAVENOUS

## 2012-11-11 MED ORDER — ADENOSINE (DIAGNOSTIC) 3 MG/ML IV SOLN: 0.8400 mg/kg | Freq: Once | INTRAVENOUS | Status: DC

## 2012-11-11 NOTE — Progress Notes (Signed)
[facility] SITE 3 NUCLEAR MED [address] [place], [place] [address] [phone]    Cardiology Nuclear Med Study  [name] is a 67 y.o. male     MRN : [id number]     DOB: 06-04-1945  Procedure Date: 11/11/2012  Nuclear Med Background Indication for Stress Test:  Evaluation for Ischemia and Graft Patency History:  '03 AWMI>CABG>ICD; '13 Echo:EF=15%; h/o Cardioversion Cardiac Risk Factors: Family History - CAD, History of Smoking, Hypertension, LBBB and Lipids  Symptoms:  Chest Pain (last episode of chest discomfort was yesterday), Dizziness, DOE and Fatigue   Nuclear Pre-Procedure Caffeine/Decaff Intake:  None NPO After: 6 pm   Lungs:  Clear. O2 Sat: 94% on room air. IV 0.9% NS with Angio Cath:  22g  IV Site: R Antecubital  IV Started by:  Bonnita Levan, RN  Chest Size (in):  46 Cup Size: n/a  Height: 5\' 10"  (1.778 m)  Weight:  208 lb (94.348 kg)  BMI:  Body mass index is 29.84 kg/(m^2). Tech Comments:  No med's taken today    Nuclear Med Study 1 or 2 day study: 1 day  Stress Test Type:  Adenosine  Reading MD: Charlton Haws, MD  Order Authorizing Provider:  Kristeen Miss, MD  Resting Radionuclide: Technetium 38m Sestamibi  Resting Radionuclide Dose: 11.0 mCi   Stress Radionuclide:  Technetium 69m Sestamibi  Stress Radionuclide Dose: 33.0 mCi           Stress Protocol Rest HR: 63 Stress HR: 90  Rest BP: 114/72 Stress BP: 147/74  Exercise Time (min): n/a METS: n/a   Predicted Max HR: 153 bpm % Max HR: 58.82 bpm Rate Pressure Product: 29528   Dose of Adenosine (mg):  52.9 Dose of Lexiscan: n/a mg  Dose of Atropine (mg): n/a Dose of Dobutamine: n/a mcg/kg/min (at max HR)  Stress Test Technologist: Smiley Houseman, CMA-N  Nuclear Technologist:  Domenic Polite, CNMT     Rest Procedure:  Myocardial perfusion imaging was performed at rest 45 minutes following the intravenous administration of Technetium 9m Sestamibi.  Rest ECG: Paced  rhythm  Stress Procedure:  The patient received IV adenosine at 140 mcg/kg/min for 4 minutes.  He c/o chest tightness with infusion.  Technetium 59m Sestamibi was injected at the 2 minute mark and quantitative spect images were obtained after a 45 minute delay.  Stress ECG: No significant change from baseline ECG  QPS Raw Data Images:  Normal; no motion artifact; normal heart/lung ratio. Stress Images:  Basal lateral infarct Rest Images:  Basal lateral wall infarct Subtraction (SDS):  There is a fixed defect that is most consistent with a previous infarction. Transient Ischemic Dilatation (Normal <1.22):  n/a Lung/Heart Ratio (Normal <0.45):  0.47  Quantitative Gated Spect Images QGS EDV:  n/a QGS ESV:  n/a  Impression Exercise Capacity:  Adenosine study with no exercise. BP Response:  Normal blood pressure response. Clinical Symptoms:  Atypical chest pain. ECG Impression:  No significant ST segment change suggestive of ischemia. 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  LV Ejection Fraction: Study not gated.  LV Wall Motion:  Study not gated   [employer]

## 2012-11-25 ENCOUNTER — Encounter: Payer: Self-pay | Admitting: Internal Medicine

## 2012-11-25 ENCOUNTER — Ambulatory Visit (INDEPENDENT_AMBULATORY_CARE_PROVIDER_SITE_OTHER): Payer: Medicare Other | Admitting: Internal Medicine

## 2012-11-25 VITALS — BP 144/87 | HR 60 | Temp 97.7°F | Ht 70.0 in | Wt 211.5 lb

## 2012-11-25 DIAGNOSIS — B376 Candidal endocarditis: Secondary | ICD-10-CM
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
## 2012-12-01 ENCOUNTER — Ambulatory Visit: Payer: Medicare Other | Admitting: Internal Medicine

## 2013-01-12 ENCOUNTER — Ambulatory Visit (INDEPENDENT_AMBULATORY_CARE_PROVIDER_SITE_OTHER): Payer: Medicare Other | Admitting: Cardiovascular Disease

## 2013-01-12 ENCOUNTER — Encounter: Payer: Self-pay | Admitting: Cardiovascular Disease

## 2013-01-12 ENCOUNTER — Other Ambulatory Visit (HOSPITAL_COMMUNITY): Payer: Self-pay | Admitting: Cardiovascular Disease

## 2013-01-12 ENCOUNTER — Ambulatory Visit (HOSPITAL_COMMUNITY): Payer: Medicare Other | Attending: Cardiovascular Disease | Admitting: Cardiology

## 2013-01-12 VITALS — BP 130/80 | HR 67 | Ht 70.0 in | Wt 215.0 lb

## 2013-01-12 DIAGNOSIS — R079 Chest pain, unspecified: Secondary | ICD-10-CM

## 2013-01-12 DIAGNOSIS — I5022 Chronic systolic (congestive) heart failure: Secondary | ICD-10-CM

## 2013-01-12 DIAGNOSIS — E785 Hyperlipidemia, unspecified: Secondary | ICD-10-CM | POA: Insufficient documentation

## 2013-01-12 DIAGNOSIS — R0989 Other specified symptoms and signs involving the circulatory and respiratory systems: Secondary | ICD-10-CM

## 2013-01-12 DIAGNOSIS — I2510 Atherosclerotic heart disease of native coronary artery without angina pectoris: Secondary | ICD-10-CM

## 2013-01-12 DIAGNOSIS — R0609 Other forms of dyspnea: Secondary | ICD-10-CM

## 2013-01-12 DIAGNOSIS — I509 Heart failure, unspecified: Secondary | ICD-10-CM | POA: Insufficient documentation

## 2013-01-12 DIAGNOSIS — R0789 Other chest pain: Secondary | ICD-10-CM | POA: Insufficient documentation

## 2013-01-12 DIAGNOSIS — I080 Rheumatic disorders of both mitral and aortic valves: Secondary | ICD-10-CM | POA: Insufficient documentation

## 2013-01-12 MED ORDER — FUROSEMIDE 20 MG PO TABS: 20.0000 mg | ORAL_TABLET | Freq: Every day | ORAL | Status: DC

## 2013-01-12 MED ORDER — POTASSIUM CHLORIDE ER 10 MEQ PO TBCR: 10.0000 meq | EXTENDED_RELEASE_TABLET | Freq: Every day | ORAL | Status: DC

## 2013-01-12 NOTE — Assessment & Plan Note (Signed)
He has not had any angina.  Cont. Current meds

## 2013-01-12 NOTE — Patient Instructions (Addendum)
Your physician has recommended you make the following change in your medication:  Start lasix 20 mg daily Start potassium (kdur) 10 meq daily with lasix  Your physician recommends that you return for lab work in: 3 weeks  Your physician recommends that you schedule a follow-up appointment in: 3 months call in December to schedule/ you will receive a reminder

## 2013-01-12 NOTE — Assessment & Plan Note (Addendum)
The echocardiogram looks better than it did several months ago. Once again, [name] has stopped some of his medications between visits.  He stopped his Lasix due to leg pain.  I suspect he had hypokalemia.   I discussed the issues in great detail.  Will try lasix again and will give him KCl 10 meq a day.  .  Will return in 3 weeks for BMP.  I Am optimistic that with continued medical therapy that his ejection fraction will continue to improve. It has improved from 15% to around 35% with the addition of the losartan. We will try to add low-dose Lasix. I was hoping to increase the dose of losartan but I don't think that we should make a change in his Lasix and losartan in the same visit. He has multiple side effects from medications and I want to make sure that we titrate up his medications very slowly. It's been very difficult to keep him on standard heart failure therapy.    I 'll see him in 3 months for ov and bmp.

## 2013-01-12 NOTE — Progress Notes (Signed)
Echo performed. 
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
## 2013-01-18 ENCOUNTER — Telehealth: Payer: Self-pay | Admitting: *Deleted

## 2013-01-18 NOTE — Telephone Encounter (Signed)
All notes and tests were sent to pcp and surgeon plus sent clearance note from requesting office stating to continue plavix.

## 2013-02-01 ENCOUNTER — Encounter: Payer: Self-pay | Admitting: Cardiovascular Disease

## 2013-02-04 ENCOUNTER — Other Ambulatory Visit: Payer: Self-pay

## 2013-02-10 ENCOUNTER — Other Ambulatory Visit (INDEPENDENT_AMBULATORY_CARE_PROVIDER_SITE_OTHER): Payer: Medicare Other

## 2013-02-10 DIAGNOSIS — I5022 Chronic systolic (congestive) heart failure: Secondary | ICD-10-CM

## 2013-02-10 DIAGNOSIS — I509 Heart failure, unspecified: Secondary | ICD-10-CM

## 2013-02-10 LAB — BASIC METABOLIC PANEL
CO2: 25 mEq/L (ref 19–32)
Chloride: 105 mEq/L (ref 96–112)
GFR: 74.01 mL/min (ref >60.00)
Glucose, Bld: 101 mg/dL — ABNORMAL HIGH (ref 70–99)
Potassium: 4.6 mEq/L (ref 3.5–5.1)
Sodium: 139 mEq/L (ref 135–145)

## 2013-03-08 ENCOUNTER — Other Ambulatory Visit: Payer: Self-pay | Admitting: Internal Medicine

## 2013-03-08 DIAGNOSIS — B379 Candidiasis, unspecified: Secondary | ICD-10-CM

## 2013-03-18 ENCOUNTER — Ambulatory Visit (INDEPENDENT_AMBULATORY_CARE_PROVIDER_SITE_OTHER): Payer: Medicare Other | Admitting: Internal Medicine

## 2013-03-18 ENCOUNTER — Encounter: Payer: Self-pay | Admitting: Internal Medicine

## 2013-03-18 VITALS — BP 118/70 | HR 80 | Ht 70.0 in | Wt 215.2 lb

## 2013-03-18 DIAGNOSIS — I4729 Other ventricular tachycardia: Secondary | ICD-10-CM

## 2013-03-18 DIAGNOSIS — I509 Heart failure, unspecified: Secondary | ICD-10-CM

## 2013-03-18 DIAGNOSIS — I5022 Chronic systolic (congestive) heart failure: Secondary | ICD-10-CM

## 2013-03-18 DIAGNOSIS — I472 Ventricular tachycardia, unspecified: Secondary | ICD-10-CM

## 2013-03-18 DIAGNOSIS — Z950 Presence of cardiac pacemaker: Secondary | ICD-10-CM

## 2013-03-18 LAB — MDC_IDC_ENUM_SESS_TYPE_INCLINIC
Brady Statistic AP VS Percent: 0.03 %
Brady Statistic AS VP Percent: 91.99 %
Brady Statistic AS VS Percent: 0.2 %
Brady Statistic RV Percent Paced: 99.77 %
Date Time Interrogation Session: 20141218142004
Lead Channel Impedance Value: 1083 Ohm
Lead Channel Impedance Value: 1235 Ohm
Lead Channel Impedance Value: 437 Ohm
Lead Channel Impedance Value: 608 Ohm
Lead Channel Impedance Value: 627 Ohm
Lead Channel Pacing Threshold Amplitude: 0.75 V
Lead Channel Pacing Threshold Amplitude: 1 V
Lead Channel Pacing Threshold Pulse Width: 0.8 ms
Lead Channel Sensing Intrinsic Amplitude: 2.5 mV
Lead Channel Setting Pacing Pulse Width: 0.8 ms

## 2013-03-18 NOTE — Patient Instructions (Signed)
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
## 2013-04-12 ENCOUNTER — Encounter: Payer: Self-pay | Admitting: Cardiovascular Disease

## 2013-04-12 ENCOUNTER — Other Ambulatory Visit: Payer: Self-pay

## 2013-04-12 ENCOUNTER — Ambulatory Visit (INDEPENDENT_AMBULATORY_CARE_PROVIDER_SITE_OTHER): Payer: Medicare HMO | Admitting: Cardiovascular Disease

## 2013-04-12 VITALS — BP 120/72 | HR 63 | Ht 70.0 in | Wt 215.0 lb

## 2013-04-12 DIAGNOSIS — R079 Chest pain, unspecified: Secondary | ICD-10-CM

## 2013-04-12 DIAGNOSIS — I509 Heart failure, unspecified: Secondary | ICD-10-CM

## 2013-04-12 DIAGNOSIS — I5022 Chronic systolic (congestive) heart failure: Secondary | ICD-10-CM

## 2013-04-12 DIAGNOSIS — I2510 Atherosclerotic heart disease of native coronary artery without angina pectoris: Secondary | ICD-10-CM

## 2013-04-12 LAB — BASIC METABOLIC PANEL
BUN: 17 mg/dL (ref 6–23)
CO2: 29 meq/L (ref 19–32)
CREATININE: 1.3 mg/dL (ref 0.4–1.5)
Calcium: 9.8 mg/dL (ref 8.4–10.5)
Chloride: 104 mEq/L (ref 96–112)
GFR: 58.97 mL/min — ABNORMAL LOW (ref >60.00)
GLUCOSE: 88 mg/dL (ref 70–99)
Potassium: 4.8 mEq/L (ref 3.5–5.1)
SODIUM: 140 meq/L (ref 135–145)

## 2013-04-12 MED ORDER — METOPROLOL TARTRATE 100 MG PO TABS
50.0000 mg | ORAL_TABLET | Freq: Two times a day (BID) | ORAL | Status: DC
Start: 2013-04-12 — End: 2013-04-12

## 2013-04-12 MED ORDER — LOSARTAN POTASSIUM 50 MG PO TABS: 50.0000 mg | ORAL_TABLET | Freq: Every day | ORAL | Status: DC

## 2013-04-12 MED ORDER — METOPROLOL TARTRATE 100 MG PO TABS: ORAL_TABLET | ORAL | Status: DC

## 2013-04-12 MED ORDER — METOPROLOL TARTRATE 100 MG PO TABS: 50.0000 mg | ORAL_TABLET | Freq: Two times a day (BID) | ORAL | Status: DC

## 2013-04-12 MED ORDER — LOSARTAN POTASSIUM 50 MG PO TABS
50.0000 mg | ORAL_TABLET | Freq: Every day | ORAL | Status: DC
Start: 2013-04-12 — End: 2013-04-12

## 2013-04-12 NOTE — Patient Instructions (Signed)
Your physician recommends that you return for lab work in: today   Your physician has recommended you make the following change in your medication:  [place] [address] [place] physician recommends that you schedule a follow-up appointment in: 3 MONTHS

## 2013-04-12 NOTE — Assessment & Plan Note (Signed)
[name] is doing OK.  He was started on Losartan during his last visit and is tolerating it well.  We will increase his dose to 50 mg a  Day.  Will see him back in 3 months for OV and BMP.
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
## 2013-04-15 ENCOUNTER — Other Ambulatory Visit: Payer: Self-pay | Admitting: *Deleted

## 2013-04-15 DIAGNOSIS — B379 Candidiasis, unspecified: Secondary | ICD-10-CM

## 2013-04-15 MED ORDER — FLUCONAZOLE 200 MG PO TABS: ORAL_TABLET | ORAL | Status: DC

## 2013-05-03 ENCOUNTER — Encounter: Payer: Self-pay | Admitting: Internal Medicine

## 2013-05-11 NOTE — Progress Notes (Signed)
Quick Note:  Patient notified of lab results from January, 2015. Verbalized agreement with current treatment plan. Scheduled his 3 month f/u appt with Dr. Acie Fredrickson for 07/14/13 @ 1:45pm. ______

## 2013-05-12 ENCOUNTER — Encounter: Payer: Self-pay | Admitting: Internal Medicine

## 2013-05-31 ENCOUNTER — Encounter: Payer: Self-pay | Admitting: Internal Medicine

## 2013-05-31 ENCOUNTER — Ambulatory Visit (INDEPENDENT_AMBULATORY_CARE_PROVIDER_SITE_OTHER): Payer: Medicare HMO | Admitting: Internal Medicine

## 2013-05-31 VITALS — BP 125/77 | HR 63 | Temp 97.8°F | Wt 205.0 lb

## 2013-05-31 DIAGNOSIS — B376 Candidal endocarditis: Secondary | ICD-10-CM
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
## 2013-06-04 ENCOUNTER — Other Ambulatory Visit: Payer: Self-pay

## 2013-06-04 MED ORDER — POTASSIUM CHLORIDE ER 10 MEQ PO TBCR: 10.0000 meq | EXTENDED_RELEASE_TABLET | Freq: Every day | ORAL | Status: DC

## 2013-06-04 MED ORDER — POTASSIUM CHLORIDE ER 10 MEQ PO TBCR: 8.0000 meq | EXTENDED_RELEASE_TABLET | Freq: Every day | ORAL | Status: DC

## 2013-06-21 ENCOUNTER — Ambulatory Visit (INDEPENDENT_AMBULATORY_CARE_PROVIDER_SITE_OTHER): Payer: Medicare HMO | Admitting: *Deleted

## 2013-06-21 DIAGNOSIS — I5022 Chronic systolic (congestive) heart failure: Secondary | ICD-10-CM

## 2013-06-21 DIAGNOSIS — I4729 Other ventricular tachycardia: Secondary | ICD-10-CM

## 2013-06-21 DIAGNOSIS — I472 Ventricular tachycardia, unspecified: Secondary | ICD-10-CM

## 2013-06-21 DIAGNOSIS — I509 Heart failure, unspecified: Secondary | ICD-10-CM

## 2013-06-21 DIAGNOSIS — I447 Left bundle-branch block, unspecified: Secondary | ICD-10-CM

## 2013-06-21 LAB — MDC_IDC_ENUM_SESS_TYPE_REMOTE
Battery Remaining Longevity: 5
Brady Statistic AS VS Percent: 1.5 %
Lead Channel Impedance Value: 589 Ohm
Lead Channel Pacing Threshold Amplitude: 0.625 V
Lead Channel Pacing Threshold Pulse Width: 0.4 ms
Lead Channel Pacing Threshold Pulse Width: 0.4 ms
Lead Channel Sensing Intrinsic Amplitude: 13 mV
Lead Channel Sensing Intrinsic Amplitude: 2.6 mV
Lead Channel Setting Sensing Sensitivity: 4 mV
MDC IDC MSMT BATTERY VOLTAGE: 3 V
MDC IDC MSMT LEADCHNL LV IMPEDANCE VALUE: 380 Ohm
MDC IDC MSMT LEADCHNL RA IMPEDANCE VALUE: 532 Ohm
MDC IDC MSMT LEADCHNL RA PACING THRESHOLD AMPLITUDE: 0.875 V
MDC IDC SET LEADCHNL LV PACING AMPLITUDE: 2 V
MDC IDC SET LEADCHNL LV PACING PULSEWIDTH: 0.8 ms
MDC IDC SET LEADCHNL RA PACING AMPLITUDE: 2 V
MDC IDC SET LEADCHNL RV PACING AMPLITUDE: 2.5 V
MDC IDC SET LEADCHNL RV PACING PULSEWIDTH: 0.4 ms
MDC IDC SET ZONE DETECTION INTERVAL: 350 ms
MDC IDC STAT BRADY AP VP PERCENT: 3.7 %
MDC IDC STAT BRADY AP VS PERCENT: 0.1 % — AB
MDC IDC STAT BRADY AS VP PERCENT: 94.8 %
Zone Setting Detection Interval: 400 ms

## 2013-06-23 ENCOUNTER — Encounter: Payer: Self-pay | Admitting: Cardiovascular Disease

## 2013-06-24 ENCOUNTER — Encounter: Payer: Self-pay | Admitting: Cardiovascular Disease

## 2013-06-30 ENCOUNTER — Encounter: Payer: Self-pay | Admitting: *Deleted

## 2013-07-08 ENCOUNTER — Encounter: Payer: Self-pay | Admitting: Internal Medicine

## 2013-07-14 ENCOUNTER — Other Ambulatory Visit: Payer: Medicare HMO

## 2013-07-14 ENCOUNTER — Encounter: Payer: Self-pay | Admitting: Cardiovascular Disease

## 2013-07-14 ENCOUNTER — Ambulatory Visit (INDEPENDENT_AMBULATORY_CARE_PROVIDER_SITE_OTHER): Payer: Medicare HMO | Admitting: Cardiovascular Disease

## 2013-07-14 VITALS — BP 142/80 | HR 72 | Ht 70.0 in | Wt 212.0 lb

## 2013-07-14 DIAGNOSIS — I509 Heart failure, unspecified: Secondary | ICD-10-CM

## 2013-07-14 DIAGNOSIS — I5022 Chronic systolic (congestive) heart failure: Secondary | ICD-10-CM

## 2013-07-14 DIAGNOSIS — I2510 Atherosclerotic heart disease of native coronary artery without angina pectoris: Secondary | ICD-10-CM

## 2013-07-14 DIAGNOSIS — Z79899 Other long term (current) drug therapy: Secondary | ICD-10-CM

## 2013-07-14 LAB — BASIC METABOLIC PANEL
BUN: 13 mg/dL (ref 6–23)
CO2: 24 meq/L (ref 19–32)
CREATININE: 1.3 mg/dL (ref 0.4–1.5)
Calcium: 10 mg/dL (ref 8.4–10.5)
Chloride: 103 mEq/L (ref 96–112)
GFR: 61.11 mL/min (ref >60.00)
Glucose, Bld: 91 mg/dL (ref 70–99)
Potassium: 4.8 mEq/L (ref 3.5–5.1)
Sodium: 137 mEq/L (ref 135–145)

## 2013-07-14 LAB — LIPID PANEL
CHOL/HDL RATIO: 9
Cholesterol: 253 mg/dL — ABNORMAL HIGH (ref 0–200)
HDL: 27.2 mg/dL — ABNORMAL LOW (ref >39.00)
LDL Cholesterol: 141 mg/dL — ABNORMAL HIGH (ref 0–99)
VLDL: 84.4 mg/dL — ABNORMAL HIGH (ref 0.0–40.0)

## 2013-07-14 LAB — HEPATIC FUNCTION PANEL
ALBUMIN: 4.2 g/dL (ref 3.5–5.2)
ALK PHOS: 60 U/L (ref 39–117)
ALT: 16 U/L (ref 0–53)
AST: 20 U/L (ref 0–37)
BILIRUBIN DIRECT: 0.1 mg/dL (ref 0.0–0.3)
Total Bilirubin: 0.7 mg/dL (ref 0.3–1.2)
Total Protein: 7.8 g/dL (ref 6.0–8.3)

## 2013-07-14 MED ORDER — LOSARTAN POTASSIUM 100 MG PO TABS: 100.0000 mg | ORAL_TABLET | Freq: Every day | ORAL | Status: DC

## 2013-07-14 NOTE — Patient Instructions (Signed)
Your physician recommends that you return for lab work in: today bmet lipid liver  Your physician recommends that you return for lab work in: 1 month/ [place] IS AGREEING [place].  Your physician recommends that you schedule a follow-up appointment in: 3 months   Your physician has recommended you make the following change in your medication:  INCREASE LOSARTAN TO 100 MG DAILY

## 2013-07-14 NOTE — Assessment & Plan Note (Signed)
He has not had any chest pain.
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
## 2013-07-14 NOTE — Assessment & Plan Note (Signed)
[name] is doing well.  He still some occasional episodes of shortness of breath overall seems to be doing well. His blood pressure is a little elevated. Her lites increases Bussard 100 mg a day. Will check a baseline basic metabolic profile today as well as lipids in hepatic profile. We'll repeat a basic Medoff profile in one month since her going up on the a starting dose.  I'll see him again in 3 months for followup visit.

## 2013-08-08 ENCOUNTER — Encounter: Payer: Self-pay | Admitting: Cardiovascular Disease

## 2013-08-11 IMAGING — RF DG ESOPHAGUS
15 of 18 series · 19 of 24 positions shown · non-contrast
Comparison: None.

CLINICAL DATA: Dysphagia.

ESOPHOGRAM/BARIUM SWALLOW
TECHNIQUE: Combined double contrast and single contrast
examination performed using effervescent crystals, thick barium
liquid, and thin barium liquid.
Fluoroscopy time:  1.8 minutes slow pulsed fluoroscopy.

[Series 1: run · 2 of 5 slices shown (1 of 15)]
[im 1/5]
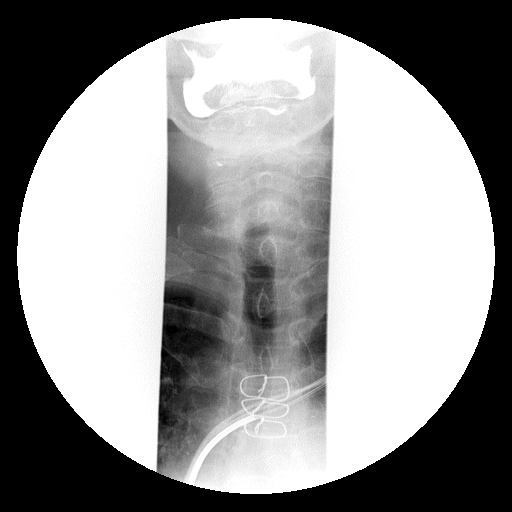
[im 5/5]
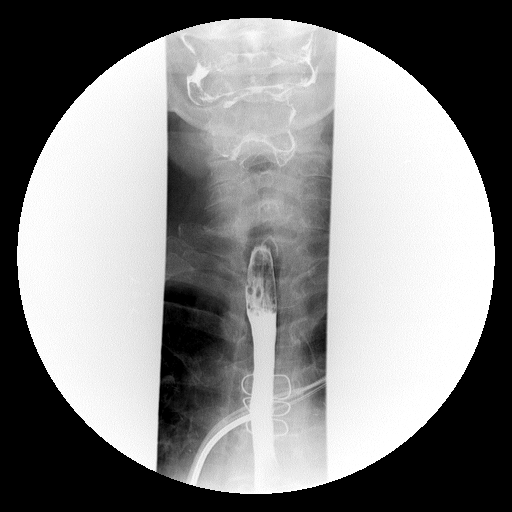

[Series 2: run · 2 of 6 slices shown (2 of 15)]
[im 3/6]
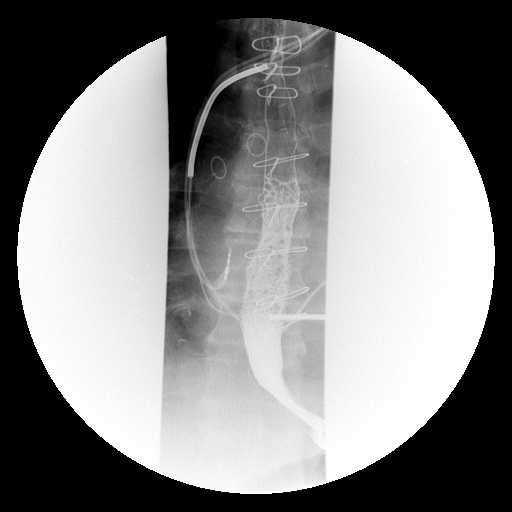
[im 6/6]
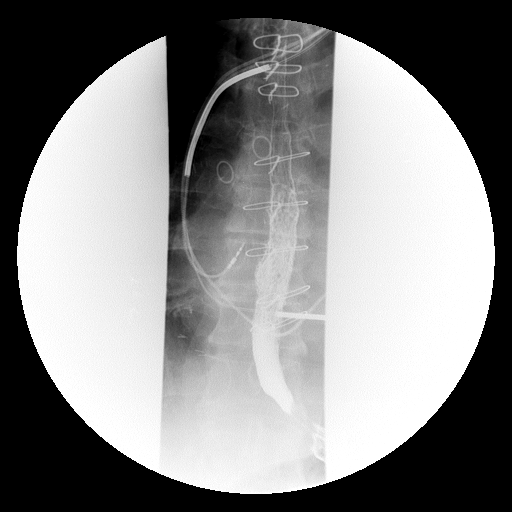

[Series 3: run · 1 of 1 slices shown (3 of 15)]
[im 1/1]
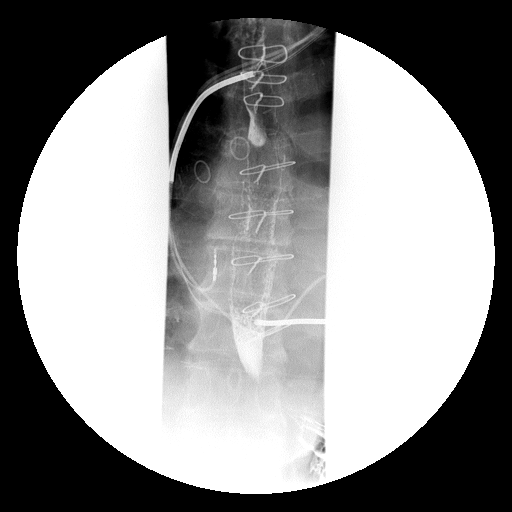

[Series 4: run · 1 of 4 slices shown (4 of 15)]
[im 1/4]
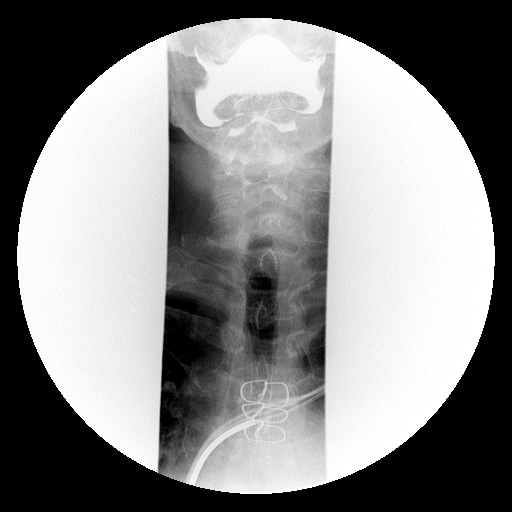

[Series 5: run · 3 of 6 slices shown (5 of 15)]
[im 1/6]
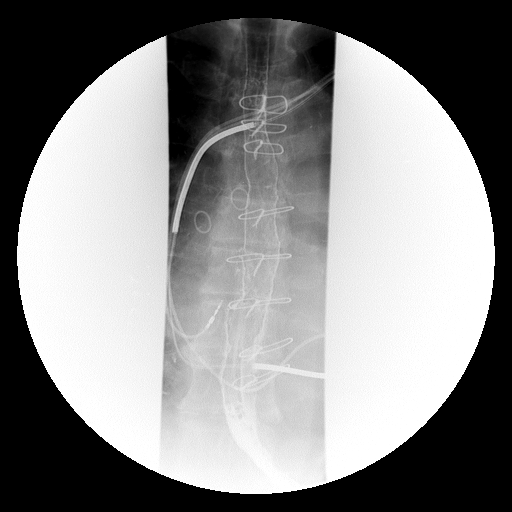
[im 3/6]
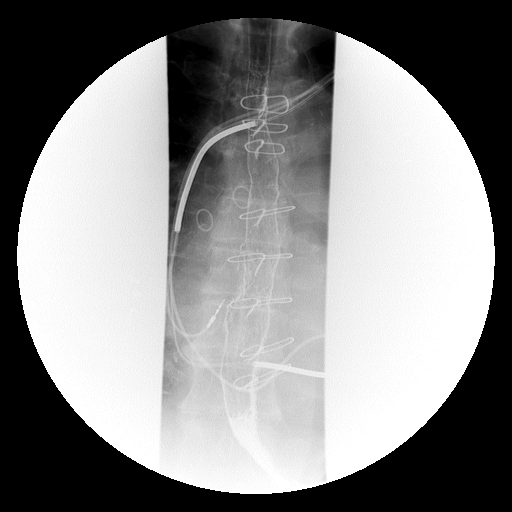
[im 6/6]
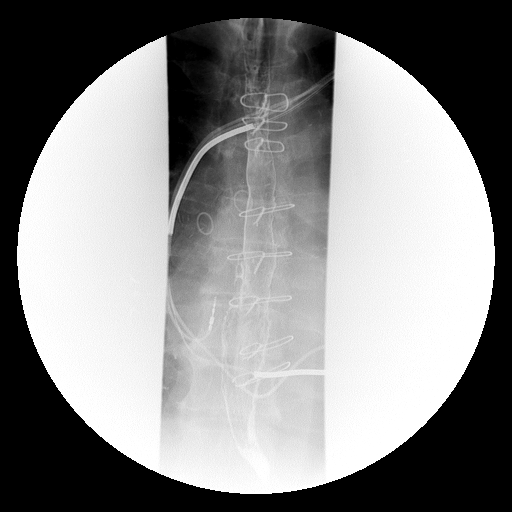

[Series 7: run · 1 of 1 slices shown (6 of 15)]
[im 1/1]
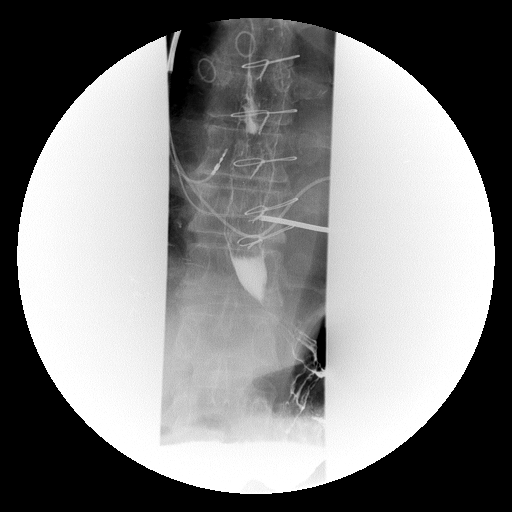

[Series 8: run · 1 of 1 slices shown (7 of 15)]
[im 1/1]
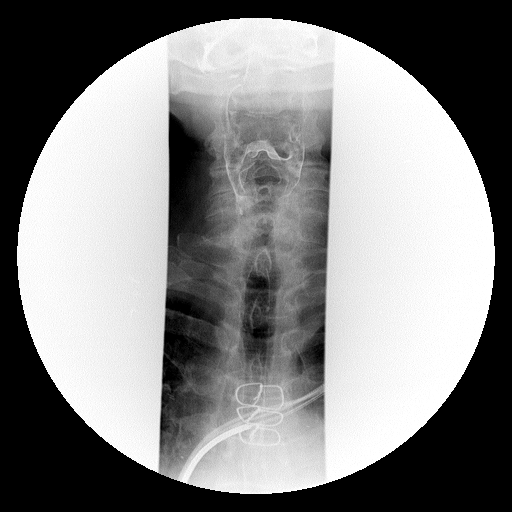

[Series 9: run · 1 of 1 slices shown (8 of 15)]
[im 1/1]
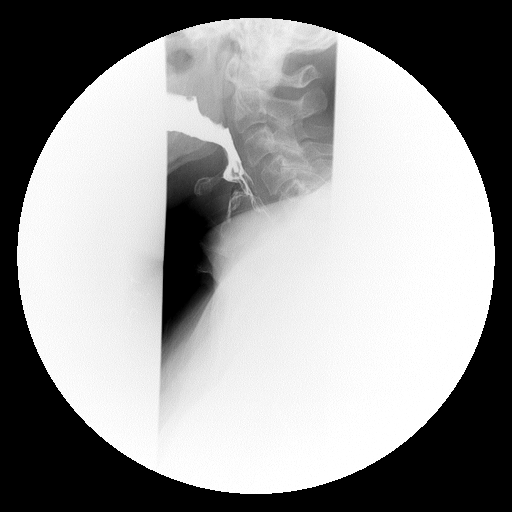

[Series 10: run · 1 of 1 slices shown (9 of 15)]
[im 1/1]
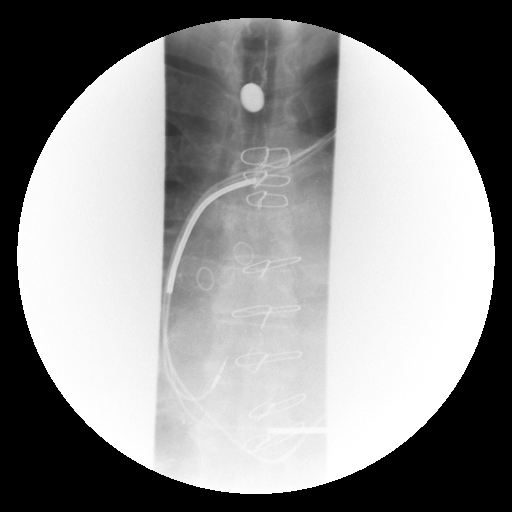

[Series 12: run · 1 of 1 slices shown (10 of 15)]
[im 1/1]
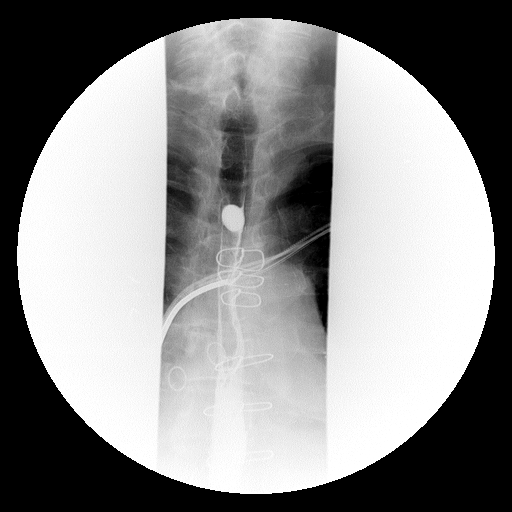

[Series 13: run · 1 of 1 slices shown (11 of 15)]
[im 1/1]
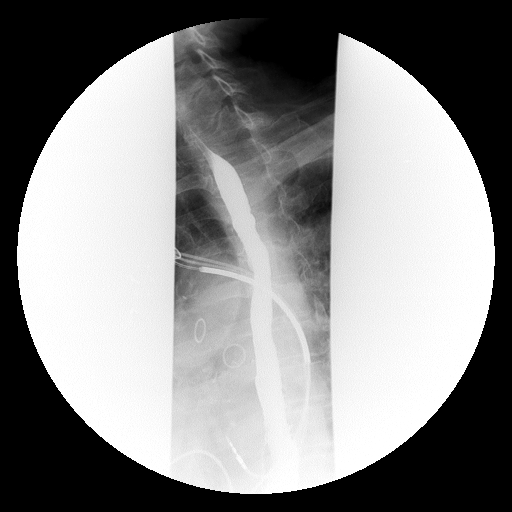

[Series 14: run · 1 of 1 slices shown (12 of 15)]
[im 1/1]
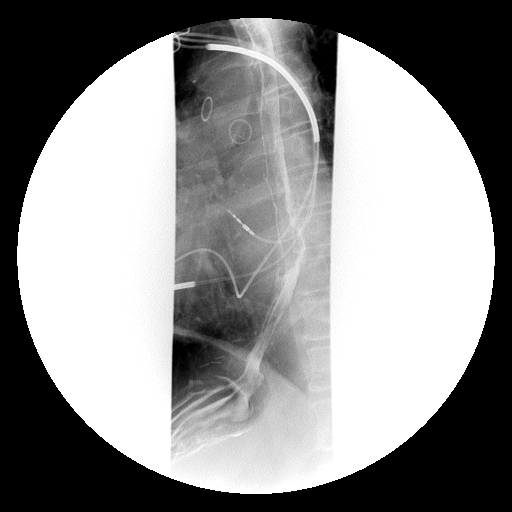

[Series 15: run · 1 of 1 slices shown (13 of 15)]
[im 1/1]
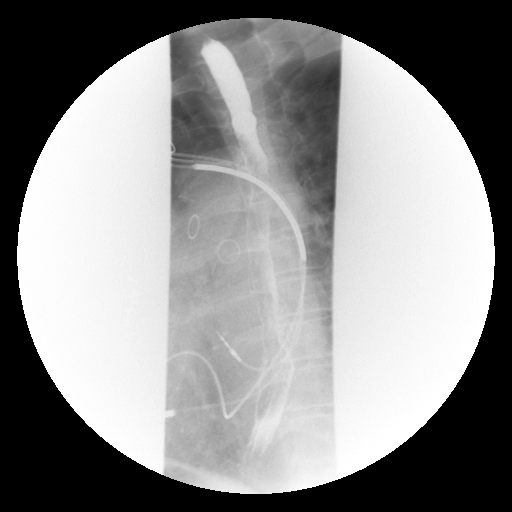

[Series 17: run · 1 of 1 slices shown (14 of 15)]
[im 1/1]
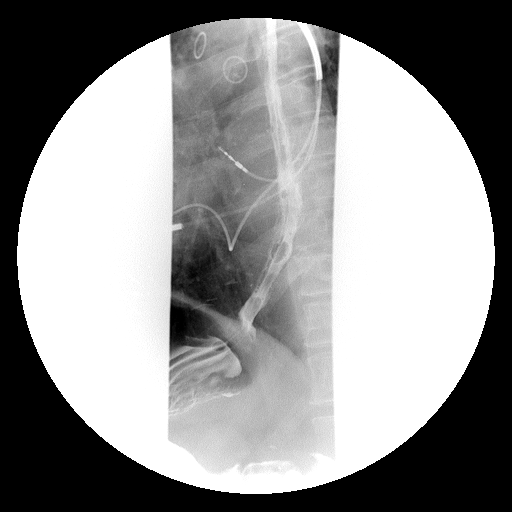

[Series 18: run · 1 of 1 slices shown (15 of 15)]
[im 1/1]
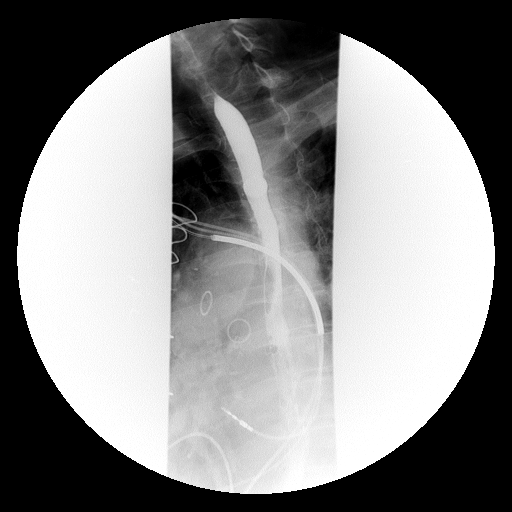

[19 of 24 positions shown; findings below may reference images not displayed]

FINDINGS: The oral pharyngeal swallowing mechanisms are normal.
There is a smooth tapered narrowing of the esophagus at the
thoracic inlet over a approximately 4 cm length.  There is no
defined focal stricture.

There are multiple small esophageal erosions in the mid to distal
esophagus.  No mass lesions.

The patient ingested a 13 mm barium tablet lodged in the upper
thoracic esophagus at the level of the thoracic inlet and did not
pass despite repeated swallows of water, barium, and changes in
patient position.

No visible hiatal hernia.
IMPRESSION: 1.  Smooth tapered narrowing of the upper thoracic esophagus at the
level of the thoracic inlet.  A 13 mm barium tablet would not pass
through this area.
2.  Multiple small erosions of the mucosa of the distal two thirds
of the esophagus consistent with esophagitis.

## 2013-08-13 ENCOUNTER — Other Ambulatory Visit: Payer: Medicare HMO

## 2013-09-04 IMAGING — CR DG CHEST 1V PORT
1 series · 1 of 1 positions shown · non-contrast
Comparison: 08/16/2011; 08/15/2011; 08/08/2011

CLINICAL DATA: Respiratory failure

PORTABLE CHEST - 1 VIEW

[view not recorded]
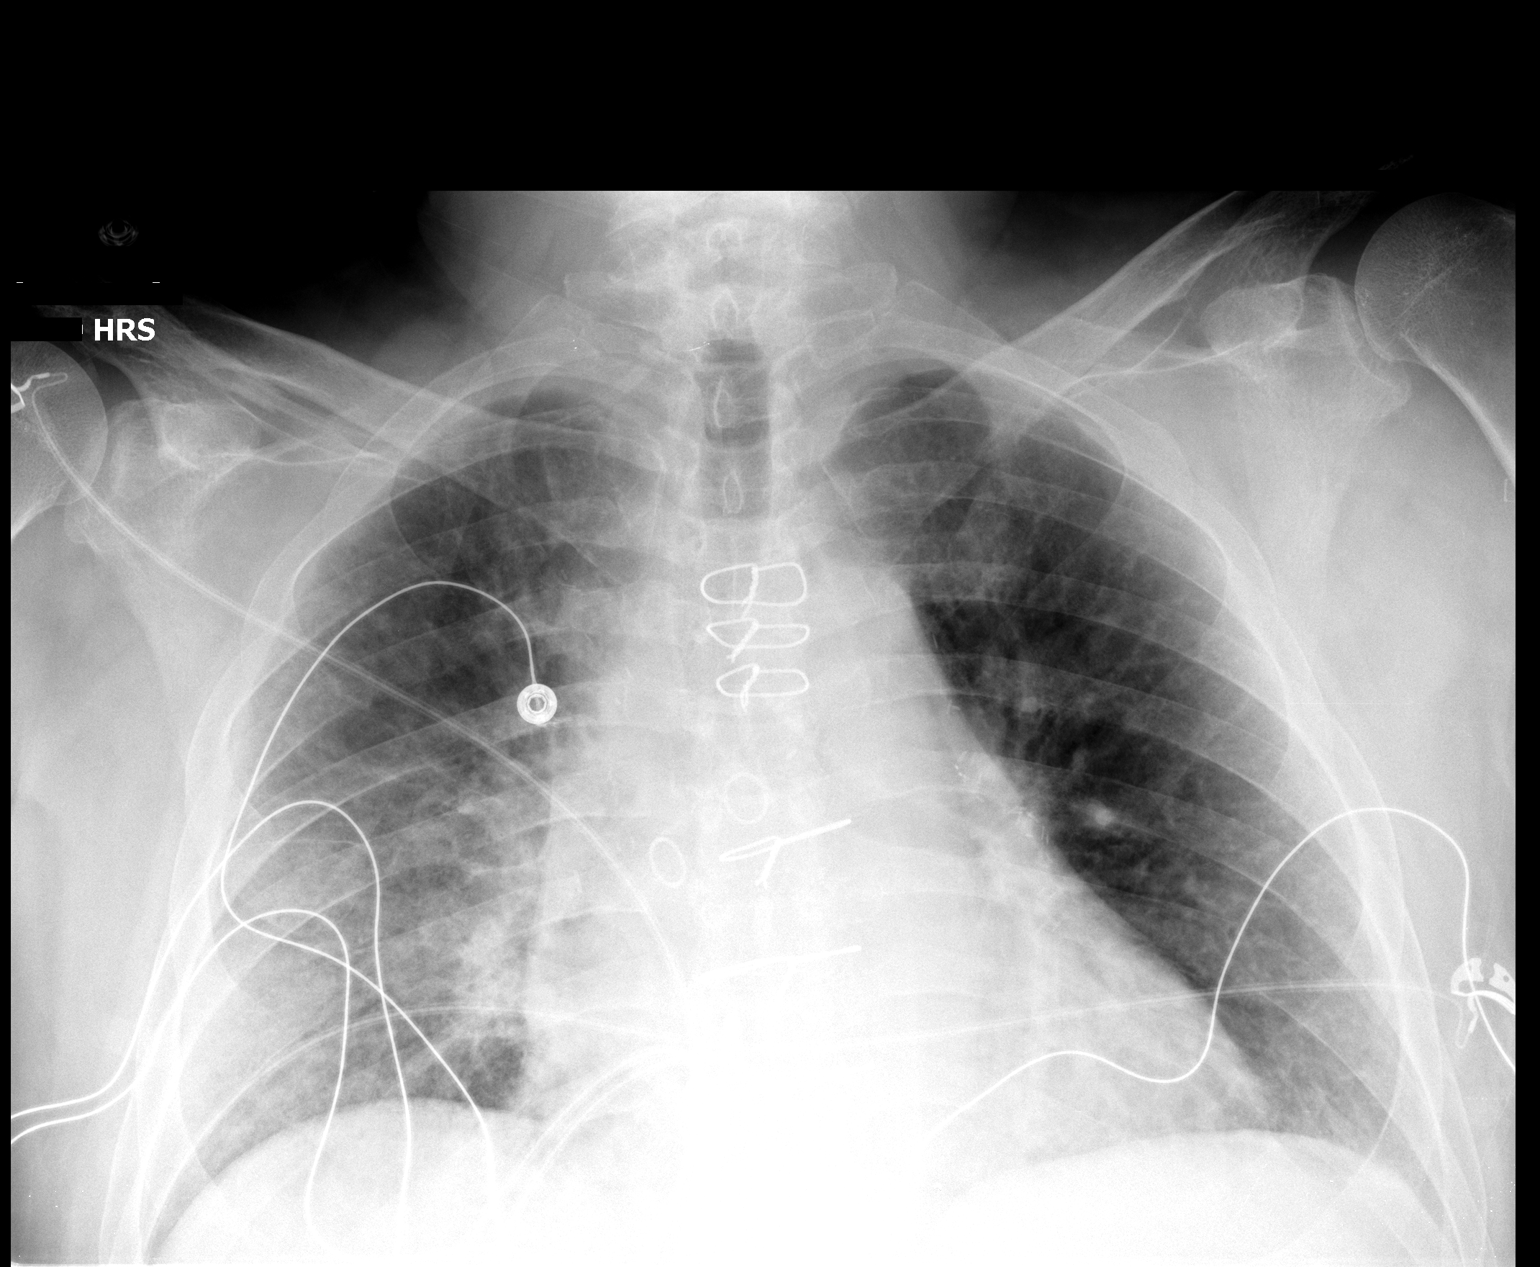

[1 of 1 positions shown; findings below may reference images not displayed]

FINDINGS: Grossly unchanged enlarged cardiac silhouette and mediastinal
contours post median sternotomy and CABG.  Interval extubation and
removal of enteric tube.  The lungs remain hyperinflated.  Ill-
defined heterogeneous opacities within the right mid lung, grossly
unchanged.  No definite pneumothorax or pleural effusion.
Unchanged bones.
IMPRESSION: 1.  Interval extubation and removal of enteric tube.  No
pneumothorax.
2.  Grossly unchanged right mid lung heterogeneous opacities,
possibly atelectasis, though developing infection is not excluded.
Continued attention on follow-up is recommended.

## 2013-09-05 IMAGING — CR DG CHEST 2V
2 series · 2 of 2 positions shown · non-contrast
Comparison: 08/17/2011; 08/16/2011; 08/15/2011

CLINICAL DATA: Shortness of breath, congestion, possible pneumonia,
CHF

CHEST - 2 VIEW

[w chest lat]
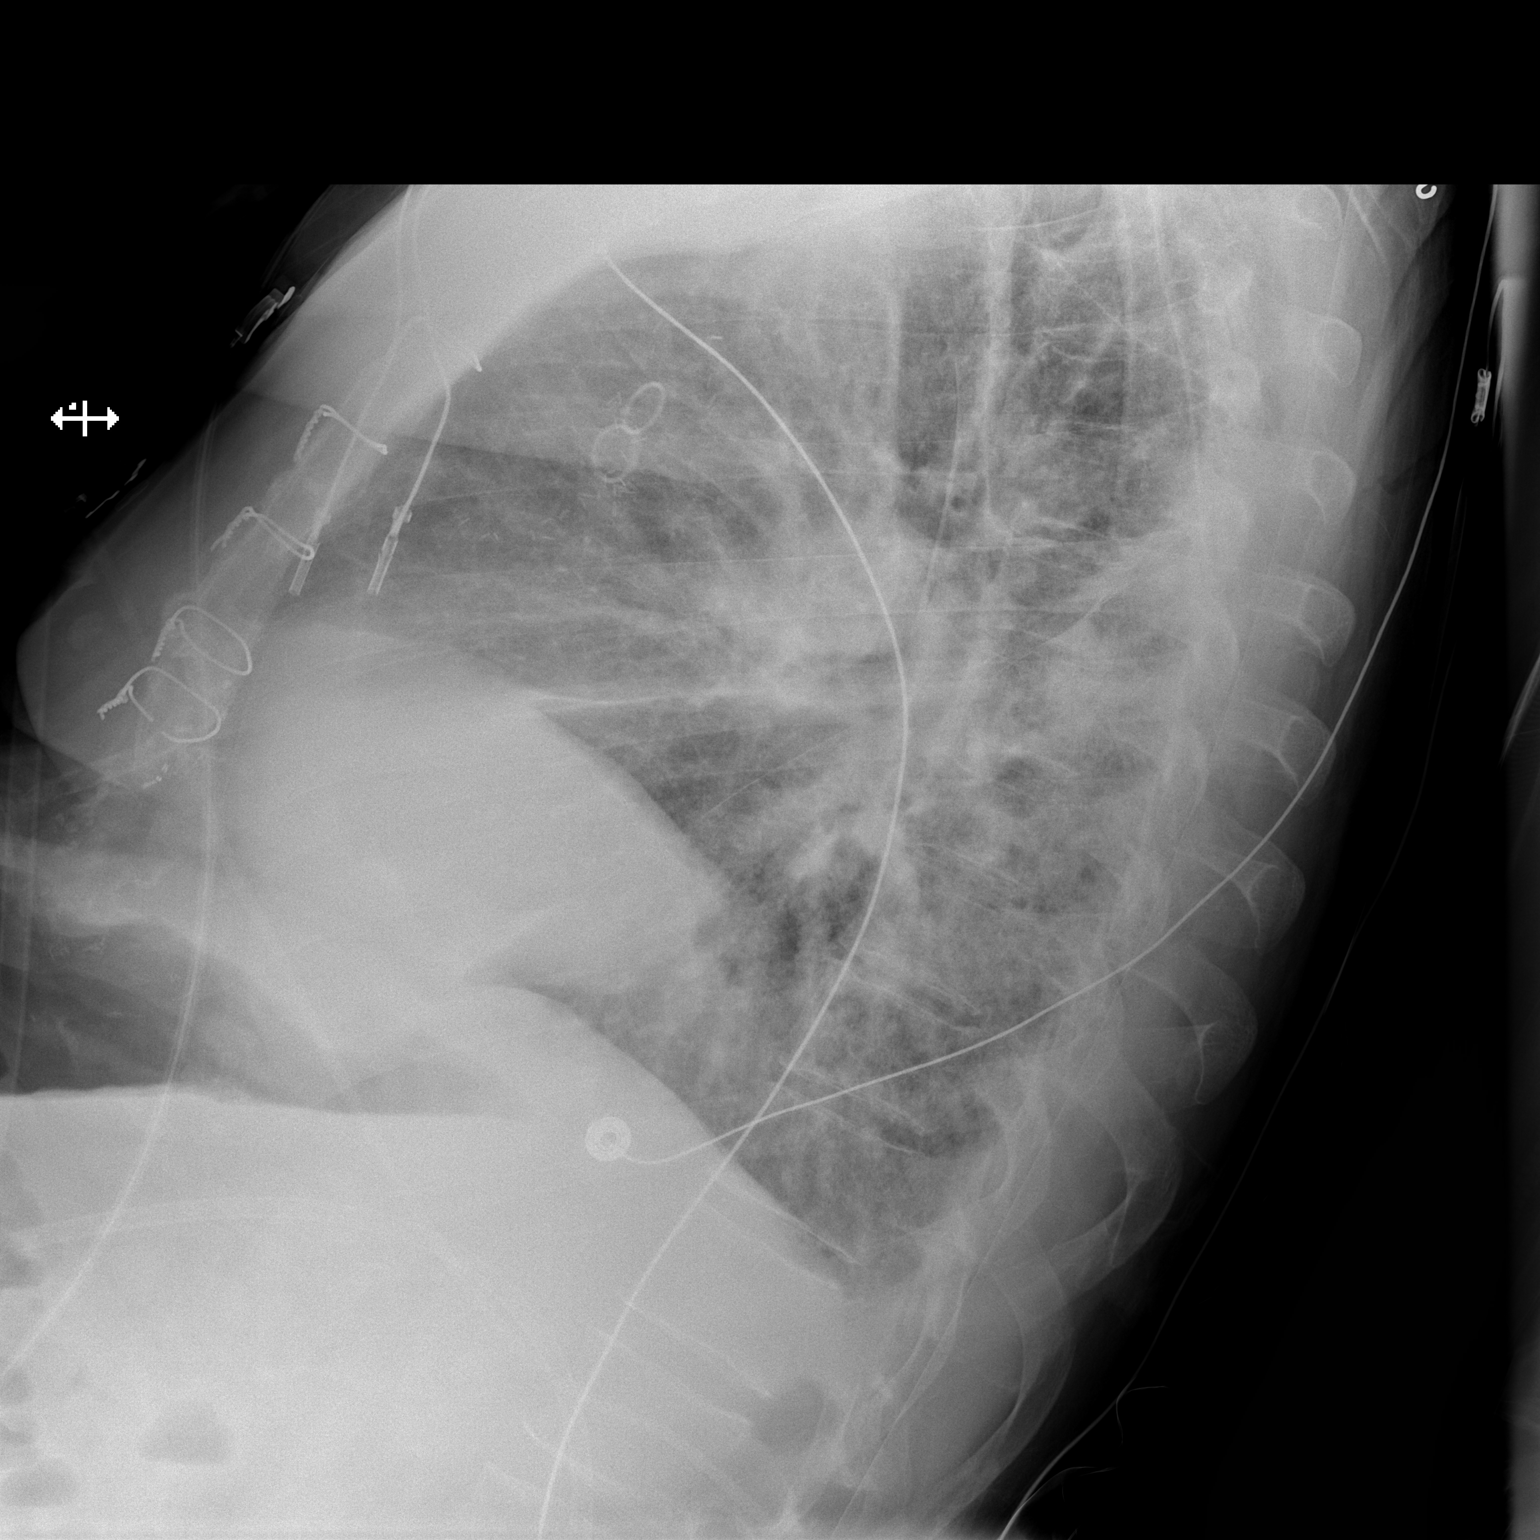

[x chest ap]
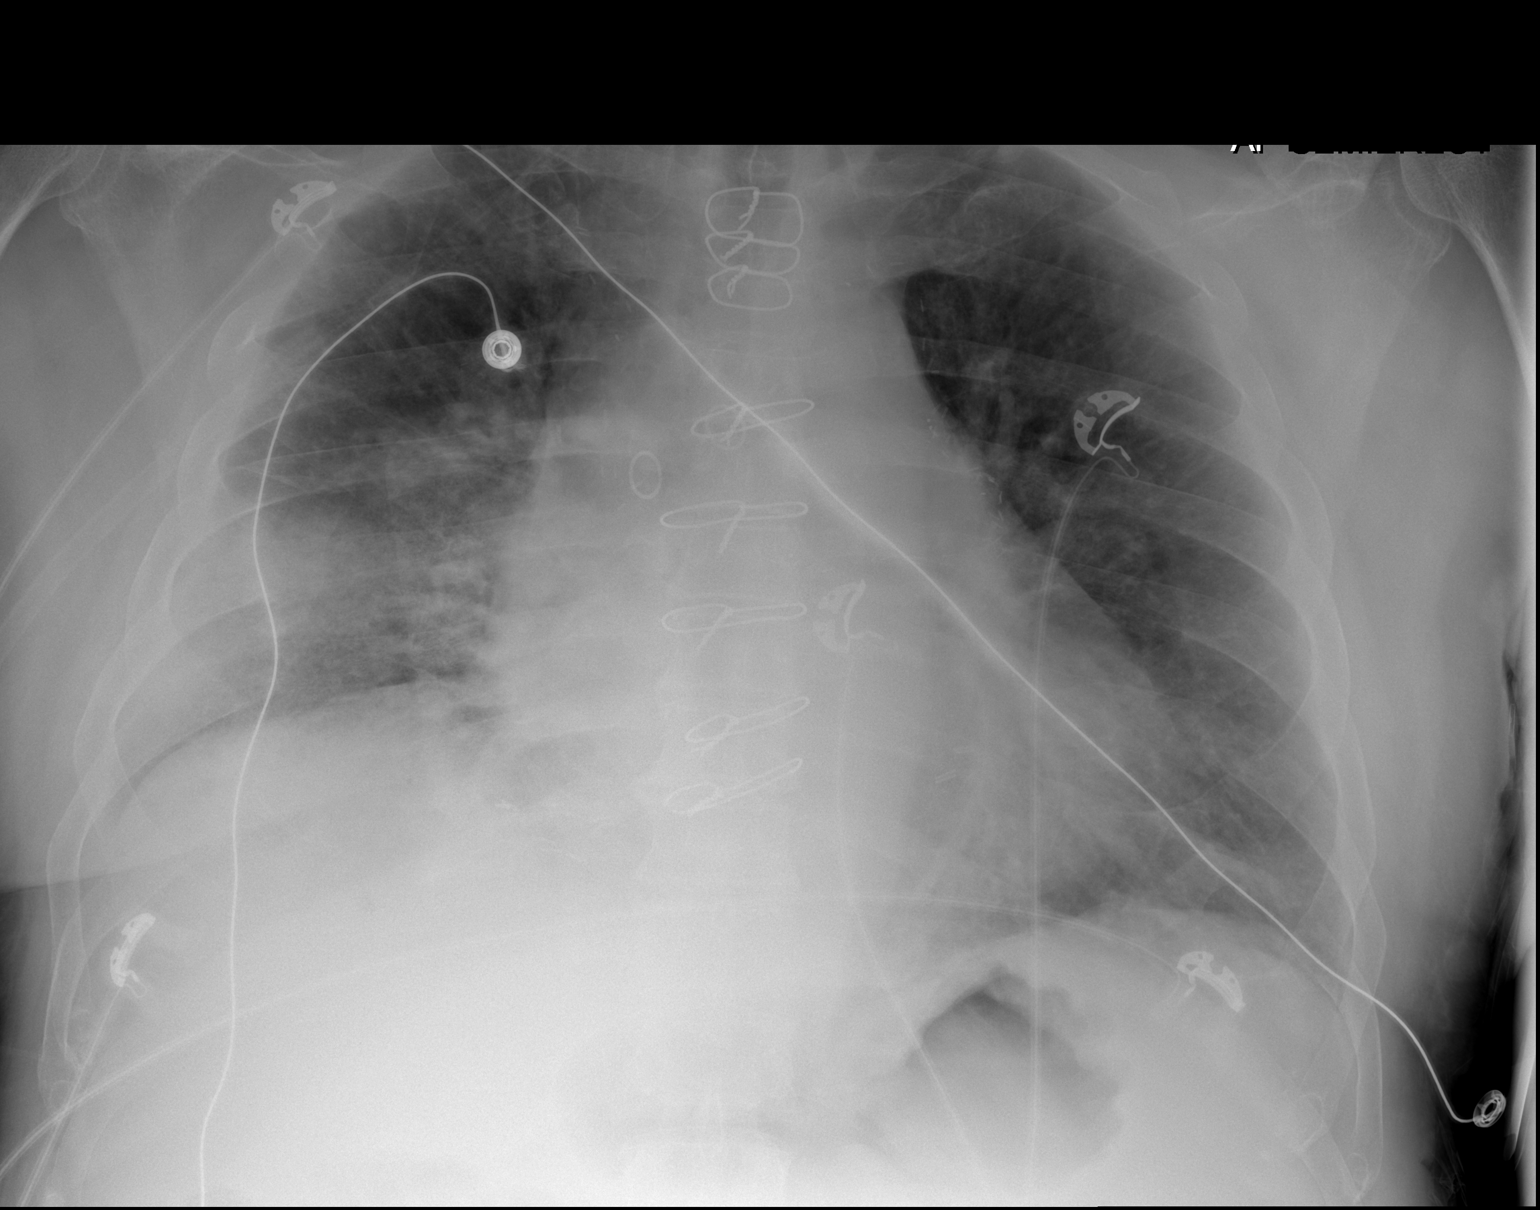

[2 of 2 positions shown; findings below may reference images not displayed]

FINDINGS: Grossly unchanged enlarged cardiac silhouette and
mediastinal contours post median sternotomy.  Lung volumes remain
persistently reduced.  Interval consolidation of peripheral
heterogeneous / consolidative opacities within the right mid lung.
No definite pleural effusion or pneumothorax.  Unchanged bones.
IMPRESSION: Worsening consolidative opacities within the right mid lung
worrisome for progression of infection.  Continued attention on
follow-up is recommended.

## 2013-09-23 ENCOUNTER — Ambulatory Visit (INDEPENDENT_AMBULATORY_CARE_PROVIDER_SITE_OTHER): Payer: Medicare HMO | Admitting: *Deleted

## 2013-09-23 DIAGNOSIS — I447 Left bundle-branch block, unspecified: Secondary | ICD-10-CM

## 2013-09-23 DIAGNOSIS — I509 Heart failure, unspecified: Secondary | ICD-10-CM

## 2013-09-23 DIAGNOSIS — I4729 Other ventricular tachycardia: Secondary | ICD-10-CM

## 2013-09-23 DIAGNOSIS — I5022 Chronic systolic (congestive) heart failure: Secondary | ICD-10-CM

## 2013-09-23 DIAGNOSIS — I472 Ventricular tachycardia, unspecified: Secondary | ICD-10-CM

## 2013-09-23 LAB — MDC_IDC_ENUM_SESS_TYPE_REMOTE
Brady Statistic AP VP Percent: 6.95 %
Brady Statistic AP VS Percent: 0.03 %
Brady Statistic AS VP Percent: 91.48 %
Brady Statistic AS VS Percent: 1.54 %
Brady Statistic RA Percent Paced: 6.99 %
Date Time Interrogation Session: 20150625193401
Lead Channel Impedance Value: 1102 Ohm
Lead Channel Impedance Value: 1159 Ohm
Lead Channel Impedance Value: 380 Ohm
Lead Channel Impedance Value: 380 Ohm
Lead Channel Impedance Value: 513 Ohm
Lead Channel Impedance Value: 589 Ohm
Lead Channel Impedance Value: 969 Ohm
Lead Channel Pacing Threshold Amplitude: 0.875 V
Lead Channel Pacing Threshold Pulse Width: 0.4 ms
Lead Channel Pacing Threshold Pulse Width: 0.4 ms
Lead Channel Sensing Intrinsic Amplitude: 4.125 mV
Lead Channel Setting Pacing Amplitude: 2.5 V
Lead Channel Setting Pacing Pulse Width: 0.4 ms
Lead Channel Setting Pacing Pulse Width: 0.8 ms
MDC IDC MSMT BATTERY REMAINING LONGEVITY: 59 mo
MDC IDC MSMT BATTERY VOLTAGE: 3 V
MDC IDC MSMT LEADCHNL RV IMPEDANCE VALUE: 418 Ohm
MDC IDC MSMT LEADCHNL RV IMPEDANCE VALUE: 589 Ohm
MDC IDC MSMT LEADCHNL RV PACING THRESHOLD AMPLITUDE: 0.75 V
MDC IDC MSMT LEADCHNL RV SENSING INTR AMPL: 11 mV
MDC IDC SET LEADCHNL LV PACING AMPLITUDE: 2 V
MDC IDC SET LEADCHNL RA PACING AMPLITUDE: 2 V
MDC IDC SET LEADCHNL RV SENSING SENSITIVITY: 4 mV
MDC IDC SET ZONE DETECTION INTERVAL: 400 ms
MDC IDC STAT BRADY RV PERCENT PACED: 98.43 %
Zone Setting Detection Interval: 350 ms

## 2013-09-23 NOTE — Progress Notes (Signed)
Remote pacemaker transmission.   

## 2013-10-06 ENCOUNTER — Encounter: Payer: Self-pay | Admitting: Cardiology

## 2013-10-12 ENCOUNTER — Encounter: Payer: Self-pay | Admitting: Internal Medicine

## 2013-10-14 ENCOUNTER — Ambulatory Visit: Payer: Medicare HMO | Admitting: Cardiovascular Disease

## 2013-11-19 ENCOUNTER — Encounter: Payer: Self-pay | Admitting: Cardiovascular Disease

## 2013-11-19 ENCOUNTER — Ambulatory Visit (INDEPENDENT_AMBULATORY_CARE_PROVIDER_SITE_OTHER): Payer: Medicare HMO | Admitting: Cardiovascular Disease

## 2013-11-19 VITALS — BP 124/94 | HR 50 | Ht 70.0 in | Wt 213.1 lb

## 2013-11-19 DIAGNOSIS — I2510 Atherosclerotic heart disease of native coronary artery without angina pectoris: Secondary | ICD-10-CM

## 2013-11-19 DIAGNOSIS — I509 Heart failure, unspecified: Secondary | ICD-10-CM

## 2013-11-19 DIAGNOSIS — I5022 Chronic systolic (congestive) heart failure: Secondary | ICD-10-CM

## 2013-11-19 MED ORDER — LOSARTAN POTASSIUM 50 MG PO TABS: 50.0000 mg | ORAL_TABLET | Freq: Every day | ORAL | Status: DC

## 2013-11-19 NOTE — Patient Instructions (Signed)
Your physician recommends that you continue on your current medications as directed. Please refer to the Current Medication list given to you today.  Your physician wants you to follow-up in: 6 months with Dr. Nahser.  You will receive a reminder letter in the mail two months in advance. If you don't receive a letter, please call our office to schedule the follow-up appointment.  
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
## 2013-11-19 NOTE — Assessment & Plan Note (Signed)
[name] is doing ok.  Continue current meds.  His BP is typically well controlled but he has been eating more salt while he has been here taking care of his mother.  I'll see him in 6 months.

## 2013-12-16 ENCOUNTER — Ambulatory Visit: Payer: Medicare HMO | Admitting: Internal Medicine

## 2013-12-21 ENCOUNTER — Ambulatory Visit: Payer: Medicare HMO | Admitting: Internal Medicine

## 2013-12-27 ENCOUNTER — Ambulatory Visit (INDEPENDENT_AMBULATORY_CARE_PROVIDER_SITE_OTHER): Payer: Medicare HMO | Admitting: *Deleted

## 2013-12-27 DIAGNOSIS — I472 Ventricular tachycardia, unspecified: Secondary | ICD-10-CM

## 2013-12-27 DIAGNOSIS — I509 Heart failure, unspecified: Secondary | ICD-10-CM

## 2013-12-27 DIAGNOSIS — I5022 Chronic systolic (congestive) heart failure: Secondary | ICD-10-CM

## 2013-12-27 DIAGNOSIS — I4729 Other ventricular tachycardia: Secondary | ICD-10-CM

## 2013-12-27 LAB — MDC_IDC_ENUM_SESS_TYPE_REMOTE
Battery Remaining Longevity: 58 mo
Battery Voltage: 3 V
Brady Statistic AP VP Percent: 18.47 %
Brady Statistic AS VP Percent: 80.86 %
Brady Statistic AS VS Percent: 0.64 %
Brady Statistic RA Percent Paced: 18.51 %
Lead Channel Impedance Value: 380 Ohm
Lead Channel Impedance Value: 399 Ohm
Lead Channel Impedance Value: 589 Ohm
Lead Channel Pacing Threshold Amplitude: 0.625 V
Lead Channel Pacing Threshold Amplitude: 1.25 V
Lead Channel Pacing Threshold Pulse Width: 0.8 ms
Lead Channel Sensing Intrinsic Amplitude: 13.75 mV
Lead Channel Sensing Intrinsic Amplitude: 2.5 mV
Lead Channel Sensing Intrinsic Amplitude: 2.5 mV
Lead Channel Setting Pacing Amplitude: 2.5 V
Lead Channel Setting Pacing Pulse Width: 0.4 ms
Lead Channel Setting Pacing Pulse Width: 0.8 ms
Lead Channel Setting Sensing Sensitivity: 4 mV
MDC IDC MSMT LEADCHNL LV IMPEDANCE VALUE: 1140 Ohm
MDC IDC MSMT LEADCHNL LV IMPEDANCE VALUE: 1197 Ohm
MDC IDC MSMT LEADCHNL LV IMPEDANCE VALUE: 589 Ohm
MDC IDC MSMT LEADCHNL LV IMPEDANCE VALUE: 969 Ohm
MDC IDC MSMT LEADCHNL RA IMPEDANCE VALUE: 399 Ohm
MDC IDC MSMT LEADCHNL RA IMPEDANCE VALUE: 551 Ohm
MDC IDC MSMT LEADCHNL RA PACING THRESHOLD AMPLITUDE: 0.75 V
MDC IDC MSMT LEADCHNL RA PACING THRESHOLD PULSEWIDTH: 0.4 ms
MDC IDC MSMT LEADCHNL RV PACING THRESHOLD PULSEWIDTH: 0.4 ms
MDC IDC MSMT LEADCHNL RV SENSING INTR AMPL: 13.75 mV
MDC IDC SESS DTM: 20150928150532
MDC IDC SET LEADCHNL LV PACING AMPLITUDE: 2.25 V
MDC IDC SET LEADCHNL RA PACING AMPLITUDE: 2 V
MDC IDC STAT BRADY AP VS PERCENT: 0.04 %
MDC IDC STAT BRADY RV PERCENT PACED: 99.33 %
Zone Setting Detection Interval: 350 ms
Zone Setting Detection Interval: 400 ms

## 2013-12-27 NOTE — Progress Notes (Signed)
Remote pacemaker transmission.   

## 2014-01-03 ENCOUNTER — Encounter: Payer: Self-pay | Admitting: Cardiology

## 2014-01-07 ENCOUNTER — Encounter: Payer: Self-pay | Admitting: Internal Medicine

## 2014-01-24 ENCOUNTER — Ambulatory Visit (INDEPENDENT_AMBULATORY_CARE_PROVIDER_SITE_OTHER): Payer: Medicare HMO | Admitting: Internal Medicine

## 2014-01-24 ENCOUNTER — Encounter: Payer: Self-pay | Admitting: Internal Medicine

## 2014-01-24 VITALS — BP 163/90 | HR 60 | Temp 97.9°F | Ht 70.0 in | Wt 213.0 lb

## 2014-01-24 DIAGNOSIS — B376 Candidal endocarditis: Secondary | ICD-10-CM
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
## 2014-03-10 ENCOUNTER — Encounter (HOSPITAL_COMMUNITY): Payer: Self-pay | Admitting: Internal Medicine

## 2014-03-15 ENCOUNTER — Encounter: Payer: Self-pay | Admitting: Internal Medicine

## 2014-03-15 ENCOUNTER — Ambulatory Visit (INDEPENDENT_AMBULATORY_CARE_PROVIDER_SITE_OTHER): Payer: Medicare HMO | Admitting: Internal Medicine

## 2014-03-15 VITALS — BP 122/62 | HR 61 | Ht 70.0 in | Wt 212.0 lb

## 2014-03-15 DIAGNOSIS — I5022 Chronic systolic (congestive) heart failure: Secondary | ICD-10-CM

## 2014-03-15 DIAGNOSIS — Z950 Presence of cardiac pacemaker: Secondary | ICD-10-CM

## 2014-03-15 DIAGNOSIS — I472 Ventricular tachycardia, unspecified: Secondary | ICD-10-CM

## 2014-03-15 LAB — MDC_IDC_ENUM_SESS_TYPE_INCLINIC
Battery Remaining Longevity: 55 mo
Battery Voltage: 3 V
Brady Statistic AS VP Percent: 88.5 %
Brady Statistic AS VS Percent: 1.1 %
Brady Statistic RA Percent Paced: 10.41 %
Brady Statistic RV Percent Paced: 98.87 %
Lead Channel Impedance Value: 1121 Ohm
Lead Channel Impedance Value: 418 Ohm
Lead Channel Pacing Threshold Amplitude: 0.625 V
Lead Channel Pacing Threshold Amplitude: 1.25 V
Lead Channel Pacing Threshold Pulse Width: 0.4 ms
Lead Channel Sensing Intrinsic Amplitude: 10 mV
Lead Channel Sensing Intrinsic Amplitude: 2.625 mV
Lead Channel Setting Pacing Amplitude: 2.5 V
Lead Channel Setting Pacing Pulse Width: 0.4 ms
Lead Channel Setting Pacing Pulse Width: 0.8 ms
Lead Channel Setting Sensing Sensitivity: 4 mV
MDC IDC MSMT LEADCHNL LV IMPEDANCE VALUE: 1178 Ohm
MDC IDC MSMT LEADCHNL LV IMPEDANCE VALUE: 380 Ohm
MDC IDC MSMT LEADCHNL LV IMPEDANCE VALUE: 589 Ohm
MDC IDC MSMT LEADCHNL LV IMPEDANCE VALUE: 969 Ohm
MDC IDC MSMT LEADCHNL LV PACING THRESHOLD PULSEWIDTH: 0.8 ms
MDC IDC MSMT LEADCHNL RA IMPEDANCE VALUE: 399 Ohm
MDC IDC MSMT LEADCHNL RA IMPEDANCE VALUE: 551 Ohm
MDC IDC MSMT LEADCHNL RA PACING THRESHOLD AMPLITUDE: 1 V
MDC IDC MSMT LEADCHNL RA PACING THRESHOLD PULSEWIDTH: 0.4 ms
MDC IDC MSMT LEADCHNL RV IMPEDANCE VALUE: 570 Ohm
MDC IDC SESS DTM: 20151215120838
MDC IDC SET LEADCHNL LV PACING AMPLITUDE: 2.25 V
MDC IDC SET LEADCHNL RA PACING AMPLITUDE: 2 V
MDC IDC STAT BRADY AP VP PERCENT: 10.37 %
MDC IDC STAT BRADY AP VS PERCENT: 0.03 %
Zone Setting Detection Interval: 350 ms
Zone Setting Detection Interval: 400 ms

## 2014-03-15 NOTE — Patient Instructions (Signed)
Your physician wants you to follow-up in: 12 months with Dr. Taylor You will receive a reminder letter in the mail two months in advance. If you don't receive a letter, please call our office to schedule the follow-up appointment.    Remote monitoring is used to monitor your Pacemaker or ICD from home. This monitoring reduces the number of office visits required to check your device to one time per year. It allows us to keep an eye on the functioning of your device to ensure it is working properly. You are scheduled for a device check from home on 06/14/14. You may send your transmission at any time that day. If you have a wireless device, the transmission will be sent automatically. After your physician reviews your transmission, you will receive a postcard with your next transmission date.   

## 2014-03-15 NOTE — Assessment & Plan Note (Signed)
He has had no sustained ventricular arrhythmias. He has had nonsustained VT. No change in medical therapy.

## 2014-03-15 NOTE — Assessment & Plan Note (Signed)
His chronic systolic heart failure is class IIb. He'll continue his current medical therapy. He is encouraged to maintain a low-sodium diet and remain active.
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
## 2014-03-15 NOTE — Assessment & Plan Note (Signed)
His Medtronic dual-chamber biventricular pacemaker is working normally. We'll plan to recheck in several months.

## 2014-03-19 ENCOUNTER — Other Ambulatory Visit: Payer: Self-pay | Admitting: Internal Medicine

## 2014-04-15 ENCOUNTER — Other Ambulatory Visit: Payer: Self-pay | Admitting: Cardiovascular Disease

## 2014-04-26 ENCOUNTER — Telehealth: Payer: Self-pay | Admitting: Cardiology

## 2014-04-26 NOTE — Telephone Encounter (Signed)
Pt stated that he will do monitor on his own at home.

## 2014-05-19 ENCOUNTER — Ambulatory Visit (INDEPENDENT_AMBULATORY_CARE_PROVIDER_SITE_OTHER): Payer: Medicare HMO | Admitting: Cardiovascular Disease

## 2014-05-19 ENCOUNTER — Encounter: Payer: Self-pay | Admitting: Cardiovascular Disease

## 2014-05-19 VITALS — BP 132/72 | HR 59 | Ht 70.0 in | Wt 197.4 lb

## 2014-05-19 DIAGNOSIS — I2510 Atherosclerotic heart disease of native coronary artery without angina pectoris: Secondary | ICD-10-CM

## 2014-05-19 DIAGNOSIS — I5022 Chronic systolic (congestive) heart failure: Secondary | ICD-10-CM

## 2014-05-19 MED ORDER — LOSARTAN POTASSIUM 100 MG PO TABS: 100.0000 mg | ORAL_TABLET | Freq: Every day | ORAL | Status: DC
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
## 2014-05-19 NOTE — Patient Instructions (Addendum)
Your physician has recommended you make the following change in your medication:  INCREASE Losartan to 100 mg daily  Your physician recommends that you return for lab work in: 1 month for basic metabolic panel  Your physician wants you to follow-up in: 6 months with Dr. Acie Fredrickson.  You will receive a reminder letter in the mail two months in advance. If you don't receive a letter, please call our office to schedule the follow-up appointment.  Call Christen Bame, RN [phone] to report blood pressure

## 2014-05-27 ENCOUNTER — Other Ambulatory Visit: Payer: Self-pay | Admitting: Cardiovascular Disease

## 2014-06-01 ENCOUNTER — Other Ambulatory Visit: Payer: Self-pay | Admitting: *Deleted

## 2014-06-01 DIAGNOSIS — B376 Candidal endocarditis: Secondary | ICD-10-CM

## 2014-06-01 MED ORDER — FLUCONAZOLE 200 MG PO TABS: 400.0000 mg | ORAL_TABLET | Freq: Every day | ORAL | Status: DC

## 2014-06-14 ENCOUNTER — Ambulatory Visit (INDEPENDENT_AMBULATORY_CARE_PROVIDER_SITE_OTHER): Payer: Medicare HMO | Admitting: *Deleted

## 2014-06-14 ENCOUNTER — Telehealth: Payer: Self-pay | Admitting: Cardiovascular Disease

## 2014-06-14 DIAGNOSIS — I472 Ventricular tachycardia, unspecified: Secondary | ICD-10-CM

## 2014-06-14 LAB — MDC_IDC_ENUM_SESS_TYPE_REMOTE
Battery Remaining Longevity: 50 mo
Battery Voltage: 3 V
Brady Statistic AS VP Percent: 64.55 %
Brady Statistic AS VS Percent: 0.37 %
Brady Statistic RA Percent Paced: 35.07 %
Date Time Interrogation Session: 20160315202127
Lead Channel Impedance Value: 1121 Ohm
Lead Channel Impedance Value: 1178 Ohm
Lead Channel Impedance Value: 380 Ohm
Lead Channel Impedance Value: 399 Ohm
Lead Channel Impedance Value: 399 Ohm
Lead Channel Impedance Value: 589 Ohm
Lead Channel Impedance Value: 608 Ohm
Lead Channel Impedance Value: 969 Ohm
Lead Channel Pacing Threshold Amplitude: 1.25 V
Lead Channel Pacing Threshold Pulse Width: 0.8 ms
Lead Channel Sensing Intrinsic Amplitude: 13.875 mV
Lead Channel Sensing Intrinsic Amplitude: 13.875 mV
Lead Channel Sensing Intrinsic Amplitude: 3 mV
Lead Channel Sensing Intrinsic Amplitude: 3 mV
Lead Channel Setting Pacing Amplitude: 2 V
Lead Channel Setting Sensing Sensitivity: 4 mV
MDC IDC MSMT LEADCHNL RA IMPEDANCE VALUE: 532 Ohm
MDC IDC MSMT LEADCHNL RA PACING THRESHOLD AMPLITUDE: 0.75 V
MDC IDC MSMT LEADCHNL RA PACING THRESHOLD PULSEWIDTH: 0.4 ms
MDC IDC MSMT LEADCHNL RV PACING THRESHOLD AMPLITUDE: 0.625 V
MDC IDC MSMT LEADCHNL RV PACING THRESHOLD PULSEWIDTH: 0.4 ms
MDC IDC SET LEADCHNL LV PACING AMPLITUDE: 2.25 V
MDC IDC SET LEADCHNL LV PACING PULSEWIDTH: 0.8 ms
MDC IDC SET LEADCHNL RV PACING AMPLITUDE: 2.5 V
MDC IDC SET LEADCHNL RV PACING PULSEWIDTH: 0.4 ms
MDC IDC STAT BRADY AP VP PERCENT: 35.01 %
MDC IDC STAT BRADY AP VS PERCENT: 0.06 %
MDC IDC STAT BRADY RV PERCENT PACED: 99.56 %
Zone Setting Detection Interval: 350 ms
Zone Setting Detection Interval: 400 ms

## 2014-06-14 NOTE — Telephone Encounter (Signed)
error 

## 2014-06-15 NOTE — Progress Notes (Signed)
Remote pacemaker transmission.   

## 2014-06-16 ENCOUNTER — Telehealth: Payer: Self-pay | Admitting: Cardiovascular Disease

## 2014-06-16 DIAGNOSIS — I2510 Atherosclerotic heart disease of native coronary artery without angina pectoris: Secondary | ICD-10-CM

## 2014-06-16 DIAGNOSIS — I5022 Chronic systolic (congestive) heart failure: Secondary | ICD-10-CM

## 2014-06-16 MED ORDER — LOSARTAN POTASSIUM 100 MG PO TABS: 50.0000 mg | ORAL_TABLET | Freq: Every day | ORAL | Status: DC

## 2014-06-16 NOTE — Telephone Encounter (Signed)
Spoke with patient's wife who states who patient stopped taking Losartan 100 mg on Monday due to kidney pain.  Wife states patient's BP has improved since he stopped the medication because he was in so much pain that it was increasing his BP.  She states he will resume Losartan 50 mg this coming Monday.  I advised wife to have patient continue to monitor BP and call me if if varies widely or remains elevated for any period of time.  Wife verbalized understanding and agreement and states she and patient continue to lose weight and are eating healthy.

## 2014-06-16 NOTE — Addendum Note (Signed)
Addended by: Emmaline Life on: 06/16/2014 02:39 PM   Modules accepted: Orders

## 2014-06-16 NOTE — Telephone Encounter (Signed)
New message         Pt would like for Sharyn Lull to call him back

## 2014-06-23 ENCOUNTER — Encounter: Payer: Self-pay | Admitting: Cardiology

## 2014-07-01 ENCOUNTER — Encounter: Payer: Self-pay | Admitting: Internal Medicine

## 2014-07-14 ENCOUNTER — Ambulatory Visit: Payer: Medicare HMO | Admitting: Internal Medicine

## 2014-07-27 ENCOUNTER — Encounter: Payer: Self-pay | Admitting: Internal Medicine

## 2014-07-27 ENCOUNTER — Ambulatory Visit (INDEPENDENT_AMBULATORY_CARE_PROVIDER_SITE_OTHER): Payer: Medicare HMO | Admitting: Internal Medicine

## 2014-07-27 VITALS — BP 160/90 | HR 60 | Temp 97.5°F | Ht 70.0 in | Wt 191.0 lb

## 2014-07-27 DIAGNOSIS — B376 Candidal endocarditis: Secondary | ICD-10-CM | POA: Diagnosis not present
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
## 2014-09-01 ENCOUNTER — Other Ambulatory Visit: Payer: Self-pay | Admitting: Internal Medicine

## 2014-09-01 ENCOUNTER — Other Ambulatory Visit: Payer: Self-pay | Admitting: Cardiovascular Disease

## 2014-09-01 NOTE — Telephone Encounter (Signed)
Medication Detail      Disp Refills Start End     metoprolol (LOPRESSOR) 100 MG tablet 90 tablet 3 04/18/2014     Sig: TAKE 1/2 TABLET TWO TIMES A DAY    E-Prescribing Status: Receipt confirmed by pharmacy (04/18/2014 8:58 AM EST)     Pharmacy    [facility] [place], [address]

## 2014-09-14 ENCOUNTER — Ambulatory Visit (INDEPENDENT_AMBULATORY_CARE_PROVIDER_SITE_OTHER): Payer: Medicare HMO | Admitting: *Deleted

## 2014-09-14 DIAGNOSIS — I5022 Chronic systolic (congestive) heart failure: Secondary | ICD-10-CM

## 2014-09-14 DIAGNOSIS — I472 Ventricular tachycardia, unspecified: Secondary | ICD-10-CM

## 2014-09-14 NOTE — Progress Notes (Signed)
Remote pacemaker transmission.   

## 2014-09-25 LAB — CUP PACEART REMOTE DEVICE CHECK
Battery Remaining Longevity: 49 mo
Brady Statistic AP VP Percent: 44.29 %
Brady Statistic AS VP Percent: 55.37 %
Brady Statistic RV Percent Paced: 99.66 %
Lead Channel Impedance Value: 1026 Ohm
Lead Channel Impedance Value: 380 Ohm
Lead Channel Impedance Value: 380 Ohm
Lead Channel Impedance Value: 513 Ohm
Lead Channel Impedance Value: 570 Ohm
Lead Channel Impedance Value: 589 Ohm
Lead Channel Pacing Threshold Amplitude: 0.625 V
Lead Channel Pacing Threshold Pulse Width: 0.4 ms
Lead Channel Pacing Threshold Pulse Width: 0.8 ms
Lead Channel Sensing Intrinsic Amplitude: 11.125 mV
Lead Channel Sensing Intrinsic Amplitude: 2.625 mV
Lead Channel Sensing Intrinsic Amplitude: 2.625 mV
Lead Channel Setting Pacing Amplitude: 2.25 V
Lead Channel Setting Pacing Amplitude: 2.5 V
Lead Channel Setting Pacing Pulse Width: 0.4 ms
MDC IDC MSMT BATTERY VOLTAGE: 3 V
MDC IDC MSMT LEADCHNL LV IMPEDANCE VALUE: 1083 Ohm
MDC IDC MSMT LEADCHNL LV IMPEDANCE VALUE: 361 Ohm
MDC IDC MSMT LEADCHNL LV IMPEDANCE VALUE: 855 Ohm
MDC IDC MSMT LEADCHNL LV PACING THRESHOLD AMPLITUDE: 1.25 V
MDC IDC MSMT LEADCHNL RA PACING THRESHOLD AMPLITUDE: 0.75 V
MDC IDC MSMT LEADCHNL RV PACING THRESHOLD PULSEWIDTH: 0.4 ms
MDC IDC MSMT LEADCHNL RV SENSING INTR AMPL: 11.125 mV
MDC IDC SESS DTM: 20160615205712
MDC IDC SET LEADCHNL LV PACING PULSEWIDTH: 0.8 ms
MDC IDC SET LEADCHNL RA PACING AMPLITUDE: 2 V
MDC IDC SET LEADCHNL RV SENSING SENSITIVITY: 4 mV
MDC IDC SET ZONE DETECTION INTERVAL: 400 ms
MDC IDC STAT BRADY AP VS PERCENT: 0.06 %
MDC IDC STAT BRADY AS VS PERCENT: 0.29 %
MDC IDC STAT BRADY RA PERCENT PACED: 44.35 %
Zone Setting Detection Interval: 350 ms

## 2014-10-26 ENCOUNTER — Encounter: Payer: Self-pay | Admitting: *Deleted

## 2014-10-31 ENCOUNTER — Encounter: Payer: Self-pay | Admitting: Internal Medicine

## 2014-11-18 ENCOUNTER — Encounter: Payer: Self-pay | Admitting: Cardiovascular Disease

## 2014-11-18 ENCOUNTER — Ambulatory Visit (INDEPENDENT_AMBULATORY_CARE_PROVIDER_SITE_OTHER): Payer: Medicare HMO | Admitting: Cardiovascular Disease

## 2014-11-18 VITALS — BP 148/88 | HR 61 | Ht 70.0 in | Wt 186.6 lb

## 2014-11-18 DIAGNOSIS — I2510 Atherosclerotic heart disease of native coronary artery without angina pectoris: Secondary | ICD-10-CM

## 2014-11-18 NOTE — Patient Instructions (Signed)
Medication Instructions:  INCREASE HCTZ to 12.5 mg every day   Labwork: TODAY - cholesterol, liver, basic metabolic panel   Testing/Procedures: None    Follow-Up: Your physician wants you to follow-up in: 6 months with Dr. Acie Fredrickson.  You will receive a reminder letter in the mail two months in advance. If you don't receive a letter, please call our office to schedule the follow-up appointment.
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
## 2014-11-19 LAB — HEPATIC FUNCTION PANEL
ALK PHOS: 47 U/L (ref 40–115)
ALT: 11 U/L (ref 9–46)
AST: 21 U/L (ref 10–35)
Albumin: 4.2 g/dL (ref 3.6–5.1)
BILIRUBIN TOTAL: 0.4 mg/dL (ref 0.2–1.2)
Total Protein: 7.2 g/dL (ref 6.1–8.1)

## 2014-11-19 LAB — BASIC METABOLIC PANEL
BUN: 19 mg/dL (ref 7–25)
CALCIUM: 10 mg/dL (ref 8.6–10.3)
CO2: 26 mmol/L (ref 20–31)
Chloride: 100 mmol/L (ref 98–110)
Creat: 1.01 mg/dL (ref 0.70–1.25)
Glucose, Bld: 80 mg/dL (ref 65–99)
POTASSIUM: 4.5 mmol/L (ref 3.5–5.3)
SODIUM: 138 mmol/L (ref 135–146)

## 2014-11-19 LAB — LIPID PANEL
CHOL/HDL RATIO: 8.9 ratio — AB (ref <=5.0)
Cholesterol: 258 mg/dL — ABNORMAL HIGH (ref 125–200)
HDL: 29 mg/dL — AB (ref >=40)
LDL CALC: 172 mg/dL — AB (ref <130)
Triglycerides: 287 mg/dL — ABNORMAL HIGH (ref <150)
VLDL: 57 mg/dL — AB (ref <30)

## 2014-11-30 ENCOUNTER — Ambulatory Visit (INDEPENDENT_AMBULATORY_CARE_PROVIDER_SITE_OTHER): Payer: Medicare HMO | Admitting: Pharmacist

## 2014-11-30 DIAGNOSIS — E785 Hyperlipidemia, unspecified: Secondary | ICD-10-CM | POA: Diagnosis not present

## 2014-11-30 MED ORDER — PRAVASTATIN SODIUM 40 MG PO TABS: 40.0000 mg | ORAL_TABLET | Freq: Every evening | ORAL | Status: DC
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
## 2014-11-30 NOTE — Patient Instructions (Signed)
Please pick up your prescription for pravastatin 40mg  once daily Call us in lipid clinic if you have any questions or problems [phone] Follow up with lab work in 3 months at primary care office and have them fax [place] your lipid panel results at [phone]

## 2014-12-15 ENCOUNTER — Ambulatory Visit (INDEPENDENT_AMBULATORY_CARE_PROVIDER_SITE_OTHER): Payer: Medicare HMO | Admitting: *Deleted

## 2014-12-15 DIAGNOSIS — I472 Ventricular tachycardia, unspecified: Secondary | ICD-10-CM

## 2014-12-15 DIAGNOSIS — I5022 Chronic systolic (congestive) heart failure: Secondary | ICD-10-CM | POA: Diagnosis not present

## 2014-12-15 NOTE — Progress Notes (Signed)
Remote pacemaker transmission.   

## 2014-12-27 ENCOUNTER — Other Ambulatory Visit: Payer: Self-pay | Admitting: Cardiovascular Disease

## 2014-12-29 ENCOUNTER — Telehealth: Payer: Self-pay | Admitting: Cardiovascular Disease

## 2014-12-29 DIAGNOSIS — E785 Hyperlipidemia, unspecified: Secondary | ICD-10-CM

## 2014-12-29 LAB — CUP PACEART REMOTE DEVICE CHECK
Brady Statistic AP VS Percent: 0.06 %
Brady Statistic AS VP Percent: 65.26 %
Brady Statistic RA Percent Paced: 34.65 %
Date Time Interrogation Session: 20160915195741
Lead Channel Impedance Value: 1007 Ohm
Lead Channel Impedance Value: 1159 Ohm
Lead Channel Impedance Value: 627 Ohm
Lead Channel Impedance Value: 646 Ohm
Lead Channel Pacing Threshold Pulse Width: 0.4 ms
Lead Channel Pacing Threshold Pulse Width: 0.8 ms
Lead Channel Sensing Intrinsic Amplitude: 11.125 mV
Lead Channel Sensing Intrinsic Amplitude: 2.5 mV
Lead Channel Setting Pacing Amplitude: 2.5 V
Lead Channel Setting Pacing Pulse Width: 0.4 ms
MDC IDC MSMT BATTERY REMAINING LONGEVITY: 50 mo
MDC IDC MSMT BATTERY VOLTAGE: 3 V
MDC IDC MSMT LEADCHNL LV IMPEDANCE VALUE: 1235 Ohm
MDC IDC MSMT LEADCHNL LV IMPEDANCE VALUE: 399 Ohm
MDC IDC MSMT LEADCHNL LV PACING THRESHOLD AMPLITUDE: 1.25 V
MDC IDC MSMT LEADCHNL RA IMPEDANCE VALUE: 418 Ohm
MDC IDC MSMT LEADCHNL RA IMPEDANCE VALUE: 570 Ohm
MDC IDC MSMT LEADCHNL RA PACING THRESHOLD AMPLITUDE: 0.875 V
MDC IDC MSMT LEADCHNL RA SENSING INTR AMPL: 2.5 mV
MDC IDC MSMT LEADCHNL RV IMPEDANCE VALUE: 418 Ohm
MDC IDC MSMT LEADCHNL RV PACING THRESHOLD AMPLITUDE: 0.625 V
MDC IDC MSMT LEADCHNL RV PACING THRESHOLD PULSEWIDTH: 0.4 ms
MDC IDC MSMT LEADCHNL RV SENSING INTR AMPL: 11.125 mV
MDC IDC SET LEADCHNL LV PACING AMPLITUDE: 2.25 V
MDC IDC SET LEADCHNL LV PACING PULSEWIDTH: 0.8 ms
MDC IDC SET LEADCHNL RA PACING AMPLITUDE: 2 V
MDC IDC SET LEADCHNL RV SENSING SENSITIVITY: 4 mV
MDC IDC SET ZONE DETECTION INTERVAL: 350 ms
MDC IDC STAT BRADY AP VP PERCENT: 34.59 %
MDC IDC STAT BRADY AS VS PERCENT: 0.09 %
MDC IDC STAT BRADY RV PERCENT PACED: 99.85 %
Zone Setting Detection Interval: 400 ms

## 2014-12-29 MED ORDER — EZETIMIBE 10 MG PO TABS: 10.0000 mg | ORAL_TABLET | Freq: Every day | ORAL | Status: DC

## 2014-12-29 NOTE — Telephone Encounter (Signed)
New message     For [name] Pt states pravastatin makes his joints/muscles hurt.  He cannot hardly walk.  Please advise

## 2014-12-29 NOTE — Telephone Encounter (Signed)
Returning patient's call - he was started on pravastatin 40mg  once daily a month ago in lipid clinic. He reports that he noticed pain the next day in his back, legs, kidneys, and that he had a headache too. He continued taking the pravastatin for close to a month but self d/ced this past Sunday 12/25/14. He is intolerant to 4 different statins now and he does not want to try another. Will start Zetia 10mg  daily, rx sent to patient's pharmacy. He was worried about the copay - informed him that Zetia will be going generic in the next few months and that the price should be coming down in the near future. He states that if it is too expensive for him to afford now, he will start taking it once it's cheaper. Advised patient to still f/u his lipid panel with PCP next week since he has been making lifestyle changes with diet and exercise.

## 2015-01-06 ENCOUNTER — Encounter: Payer: Self-pay | Admitting: Cardiology

## 2015-01-18 ENCOUNTER — Encounter: Payer: Self-pay | Admitting: Internal Medicine

## 2015-02-27 ENCOUNTER — Telehealth: Payer: Self-pay | Admitting: Nurse Practitioner

## 2015-02-27 NOTE — Telephone Encounter (Signed)
Left message for patient to call the office regarding Rx request received from [facility] for losartan/hctz.

## 2015-02-28 ENCOUNTER — Other Ambulatory Visit: Payer: Self-pay | Admitting: Nurse Practitioner

## 2015-02-28 ENCOUNTER — Telehealth: Payer: Self-pay | Admitting: *Deleted

## 2015-02-28 NOTE — Telephone Encounter (Signed)
Done

## 2015-03-07 ENCOUNTER — Other Ambulatory Visit: Payer: Self-pay

## 2015-03-21 ENCOUNTER — Encounter: Payer: Self-pay | Admitting: Internal Medicine

## 2015-03-21 ENCOUNTER — Ambulatory Visit (INDEPENDENT_AMBULATORY_CARE_PROVIDER_SITE_OTHER): Payer: Medicare HMO | Admitting: Internal Medicine

## 2015-03-21 VITALS — BP 160/84 | HR 60 | Ht 70.0 in | Wt 196.0 lb

## 2015-03-21 DIAGNOSIS — Z950 Presence of cardiac pacemaker: Secondary | ICD-10-CM

## 2015-03-21 DIAGNOSIS — B376 Candidal endocarditis: Secondary | ICD-10-CM

## 2015-03-21 DIAGNOSIS — I472 Ventricular tachycardia, unspecified: Secondary | ICD-10-CM

## 2015-03-21 DIAGNOSIS — I5022 Chronic systolic (congestive) heart failure: Secondary | ICD-10-CM | POA: Diagnosis not present

## 2015-03-21 LAB — CUP PACEART INCLINIC DEVICE CHECK
Battery Remaining Longevity: 52 mo
Brady Statistic AP VS Percent: 0.06 %
Brady Statistic RA Percent Paced: 32.95 %
Date Time Interrogation Session: 20161220170652
Implantable Lead Implant Date: 20131125
Implantable Lead Implant Date: 20131125
Implantable Lead Location: 753860
Implantable Lead Model: 5076
Lead Channel Impedance Value: 1140 Ohm
Lead Channel Pacing Threshold Amplitude: 0.625 V
Lead Channel Pacing Threshold Pulse Width: 0.4 ms
Lead Channel Sensing Intrinsic Amplitude: 10.5 mV
Lead Channel Setting Pacing Amplitude: 2 V
Lead Channel Setting Pacing Pulse Width: 0.4 ms
Lead Channel Setting Sensing Sensitivity: 4 mV
MDC IDC LEAD IMPLANT DT: 20131125
MDC IDC LEAD LOCATION: 753858
MDC IDC LEAD LOCATION: 753859
MDC IDC LEAD MODEL: 4194
MDC IDC MSMT BATTERY VOLTAGE: 3 V
MDC IDC MSMT LEADCHNL LV IMPEDANCE VALUE: 1007 Ohm
MDC IDC MSMT LEADCHNL LV IMPEDANCE VALUE: 1235 Ohm
MDC IDC MSMT LEADCHNL LV IMPEDANCE VALUE: 399 Ohm
MDC IDC MSMT LEADCHNL LV IMPEDANCE VALUE: 646 Ohm
MDC IDC MSMT LEADCHNL LV PACING THRESHOLD AMPLITUDE: 1.25 V
MDC IDC MSMT LEADCHNL LV PACING THRESHOLD PULSEWIDTH: 0.8 ms
MDC IDC MSMT LEADCHNL RA IMPEDANCE VALUE: 399 Ohm
MDC IDC MSMT LEADCHNL RA IMPEDANCE VALUE: 570 Ohm
MDC IDC MSMT LEADCHNL RA PACING THRESHOLD AMPLITUDE: 1 V
MDC IDC MSMT LEADCHNL RA PACING THRESHOLD PULSEWIDTH: 0.4 ms
MDC IDC MSMT LEADCHNL RA SENSING INTR AMPL: 2.8 mV
MDC IDC MSMT LEADCHNL RV IMPEDANCE VALUE: 418 Ohm
MDC IDC MSMT LEADCHNL RV IMPEDANCE VALUE: 627 Ohm
MDC IDC SET LEADCHNL LV PACING AMPLITUDE: 2.25 V
MDC IDC SET LEADCHNL LV PACING PULSEWIDTH: 0.8 ms
MDC IDC SET LEADCHNL RV PACING AMPLITUDE: 2 V
MDC IDC STAT BRADY AP VP PERCENT: 32.89 %
MDC IDC STAT BRADY AS VP PERCENT: 66.8 %
MDC IDC STAT BRADY AS VS PERCENT: 0.24 %
MDC IDC STAT BRADY RV PERCENT PACED: 99.69 %

## 2015-03-21 NOTE — Assessment & Plan Note (Signed)
He remains on suppressive Diflucan. He will continue his current meds and I have encourage him to followup with Dr. Megan Salon in [place].

## 2015-03-21 NOTE — Assessment & Plan Note (Signed)
He is maintaining NSR without recurrent symptomatic VT. No change in his meds.

## 2015-03-21 NOTE — Assessment & Plan Note (Signed)
His medtronic BiV PPM is working normally. Will recheck in several months.  

## 2015-03-21 NOTE — Assessment & Plan Note (Signed)
His symptoms remain class 2. He will continue his current meds. 
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
## 2015-03-21 NOTE — Patient Instructions (Signed)
Medication Instructions:  Your physician recommends that you continue on your current medications as directed. Please refer to the Current Medication list given to you today.   Labwork: None ordered   Testing/Procedures: None ordered   Follow-Up: Your physician wants you to follow-up in: 12 months with Dr Knox Saliva will receive a reminder letter in the mail two months in advance. If you don't receive a letter, please call our office to schedule the follow-up appointment.  Remote monitoring is used to monitor your Pacemaker  from home. This monitoring reduces the number of office visits required to check your device to one time per year. It allows [place] to keep an eye on the functioning of your device to ensure it is working properly. You are scheduled for a device check from home on 06/20/15. You may send your transmission at any time that day. If you have a wireless device, the transmission will be sent automatically. After your physician reviews your transmission, you will receive a postcard with your next transmission date.     Any Other Special Instructions Will Be Listed Below (If Applicable).     If you need a refill on your cardiac medications before your next appointment, please call your pharmacy.

## 2015-04-21 ENCOUNTER — Other Ambulatory Visit: Payer: Self-pay | Admitting: *Deleted

## 2015-04-21 DIAGNOSIS — I2510 Atherosclerotic heart disease of native coronary artery without angina pectoris: Secondary | ICD-10-CM

## 2015-04-21 DIAGNOSIS — I5022 Chronic systolic (congestive) heart failure: Secondary | ICD-10-CM

## 2015-04-21 MED ORDER — LOSARTAN POTASSIUM 100 MG PO TABS: 50.0000 mg | ORAL_TABLET | Freq: Every day | ORAL | Status: DC

## 2015-05-18 ENCOUNTER — Ambulatory Visit (INDEPENDENT_AMBULATORY_CARE_PROVIDER_SITE_OTHER): Payer: Medicare HMO | Admitting: Cardiovascular Disease

## 2015-05-18 ENCOUNTER — Encounter: Payer: Self-pay | Admitting: Cardiovascular Disease

## 2015-05-18 VITALS — BP 160/102 | HR 64 | Ht 70.0 in | Wt 202.0 lb

## 2015-05-18 DIAGNOSIS — I2510 Atherosclerotic heart disease of native coronary artery without angina pectoris: Secondary | ICD-10-CM

## 2015-05-18 MED ORDER — AMLODIPINE BESYLATE 5 MG PO TABS: 5.0000 mg | ORAL_TABLET | Freq: Every day | ORAL | Status: DC
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
## 2015-05-18 NOTE — Patient Instructions (Signed)
Medication Instructions:  START Amlodipine 5 mg once daily   Labwork: None Ordered   Testing/Procedures: None Ordered   Follow-Up: Your physician recommends that you schedule a follow-up appointment in: 3 months with Dr. Acie Fredrickson.    If you need a refill on your cardiac medications before your next appointment, please call your pharmacy.   Thank you for choosing [facility]! Christen Bame, RN [phone]

## 2015-05-29 ENCOUNTER — Telehealth: Payer: Self-pay | Admitting: Cardiovascular Disease

## 2015-05-29 NOTE — Telephone Encounter (Signed)
Spoke with patient who states he has stopped Amlodipine due to swelling in hands and legs and pain all over.  He advised that the symptoms started when he started the amlodipine.  BP readings yesterday and today have been:  158/88, 143/97, 143/91, 170/87.  He states he has not taken the amlodipine in a couple of days.  I advised him that I will review with Dr. Acie Fredrickson and call him back tomorrow with his advice.  His wife asked what medication I thought Dr. Acie Fredrickson might prescribe and I asked if patient has ever taken doxazosin.  She denies.  I advised I will call back later today or tomorrow with Dr. Elmarie Shiley advice.

## 2015-05-29 NOTE — Telephone Encounter (Signed)
Pt c/o medication issue:  1. Name of Medication: Amoldipine 5 mg   2. How are you currently taking this medication (dosage and times per day)? 1 tab po daily   3. Are you having a reaction (difficulty breathing--STAT)?  Swollen extremities( hands and legs), numbness in extremities, warm tingling , BP elevated    4. What is your medication issue? Bad reaction to medication

## 2015-05-29 NOTE — Telephone Encounter (Signed)
DC amlodipine. Start Doxzosin 4 mg a day .  Continue to record BP Call us with BP and HR readings.

## 2015-05-30 MED ORDER — DOXAZOSIN MESYLATE 4 MG PO TABS: 4.0000 mg | ORAL_TABLET | Freq: Every day | ORAL | Status: DC

## 2015-05-30 NOTE — Telephone Encounter (Signed)
Spoke with patient and reviewed Dr. Elmarie Shiley advice with patient to start doxazosin 4 mg once daily.  I advised he take the medication 12 hours apart from losartan since he is also on takinig lopressor twice daily.  He is concerned that he will have side effects; states he has had problems with multiple medications in the past.  I advised him to call back with questions or concerns.  He verbalized understanding and agreement.

## 2015-06-20 ENCOUNTER — Ambulatory Visit (INDEPENDENT_AMBULATORY_CARE_PROVIDER_SITE_OTHER): Payer: Medicare HMO | Admitting: *Deleted

## 2015-06-20 DIAGNOSIS — I472 Ventricular tachycardia, unspecified: Secondary | ICD-10-CM

## 2015-06-20 DIAGNOSIS — Z950 Presence of cardiac pacemaker: Secondary | ICD-10-CM | POA: Diagnosis not present

## 2015-06-20 DIAGNOSIS — I5022 Chronic systolic (congestive) heart failure: Secondary | ICD-10-CM

## 2015-06-21 NOTE — Progress Notes (Signed)
Remote pacemaker transmission.   

## 2015-07-19 LAB — CUP PACEART REMOTE DEVICE CHECK
Battery Voltage: 2.99 V
Brady Statistic AP VP Percent: 18 %
Brady Statistic AS VS Percent: 0.19 %
Brady Statistic RA Percent Paced: 18.06 %
Brady Statistic RV Percent Paced: 99.75 %
Implantable Lead Implant Date: 20131125
Implantable Lead Implant Date: 20131125
Implantable Lead Location: 753858
Implantable Lead Location: 753859
Implantable Lead Model: 4194
Implantable Lead Model: 5076
Implantable Lead Model: 5076
Lead Channel Impedance Value: 1007 Ohm
Lead Channel Impedance Value: 361 Ohm
Lead Channel Impedance Value: 570 Ohm
Lead Channel Pacing Threshold Pulse Width: 0.4 ms
Lead Channel Pacing Threshold Pulse Width: 0.4 ms
Lead Channel Sensing Intrinsic Amplitude: 11 mV
Lead Channel Sensing Intrinsic Amplitude: 3.625 mV
Lead Channel Setting Pacing Amplitude: 2 V
Lead Channel Setting Pacing Pulse Width: 0.4 ms
MDC IDC LEAD IMPLANT DT: 20131125
MDC IDC LEAD LOCATION: 753860
MDC IDC MSMT BATTERY REMAINING LONGEVITY: 47 mo
MDC IDC MSMT LEADCHNL LV IMPEDANCE VALUE: 1083 Ohm
MDC IDC MSMT LEADCHNL LV IMPEDANCE VALUE: 342 Ohm
MDC IDC MSMT LEADCHNL LV IMPEDANCE VALUE: 855 Ohm
MDC IDC MSMT LEADCHNL RA IMPEDANCE VALUE: 513 Ohm
MDC IDC MSMT LEADCHNL RA PACING THRESHOLD AMPLITUDE: 0.875 V
MDC IDC MSMT LEADCHNL RA SENSING INTR AMPL: 3.625 mV
MDC IDC MSMT LEADCHNL RV IMPEDANCE VALUE: 361 Ohm
MDC IDC MSMT LEADCHNL RV IMPEDANCE VALUE: 570 Ohm
MDC IDC MSMT LEADCHNL RV PACING THRESHOLD AMPLITUDE: 0.75 V
MDC IDC MSMT LEADCHNL RV SENSING INTR AMPL: 11 mV
MDC IDC SESS DTM: 20170321195339
MDC IDC SET LEADCHNL LV PACING AMPLITUDE: 2.25 V
MDC IDC SET LEADCHNL LV PACING PULSEWIDTH: 0.8 ms
MDC IDC SET LEADCHNL RV PACING AMPLITUDE: 2 V
MDC IDC SET LEADCHNL RV SENSING SENSITIVITY: 4 mV
MDC IDC STAT BRADY AP VS PERCENT: 0.06 %
MDC IDC STAT BRADY AS VP PERCENT: 81.75 %

## 2015-07-21 ENCOUNTER — Encounter: Payer: Self-pay | Admitting: Cardiology

## 2015-08-03 ENCOUNTER — Other Ambulatory Visit: Payer: Self-pay | Admitting: Internal Medicine

## 2015-08-03 DIAGNOSIS — B376 Candidal endocarditis: Secondary | ICD-10-CM

## 2015-08-16 ENCOUNTER — Encounter: Payer: Self-pay | Admitting: Cardiovascular Disease

## 2015-08-16 ENCOUNTER — Ambulatory Visit (INDEPENDENT_AMBULATORY_CARE_PROVIDER_SITE_OTHER): Payer: Medicare HMO | Admitting: Cardiovascular Disease

## 2015-08-16 VITALS — BP 140/90 | HR 60 | Ht 70.0 in | Wt 197.4 lb

## 2015-08-16 DIAGNOSIS — I5022 Chronic systolic (congestive) heart failure: Secondary | ICD-10-CM

## 2015-08-16 DIAGNOSIS — I2510 Atherosclerotic heart disease of native coronary artery without angina pectoris: Secondary | ICD-10-CM

## 2015-08-16 DIAGNOSIS — R5383 Other fatigue: Secondary | ICD-10-CM | POA: Diagnosis not present

## 2015-08-16 LAB — CBC WITH DIFFERENTIAL/PLATELET
Basophils Absolute: 88 cells/uL (ref 0–200)
Basophils Relative: 1 %
EOS PCT: 1 %
Eosinophils Absolute: 88 cells/uL (ref 15–500)
HEMATOCRIT: 46.6 % (ref 38.5–50.0)
HEMOGLOBIN: 15.9 g/dL (ref 13.2–17.1)
LYMPHS ABS: 1584 {cells}/uL (ref 850–3900)
Lymphocytes Relative: 18 %
MCH: 31.6 pg (ref 27.0–33.0)
MCHC: 34.1 g/dL (ref 32.0–36.0)
MCV: 92.6 fL (ref 80.0–100.0)
MONO ABS: 1232 {cells}/uL — AB (ref 200–950)
MPV: 10.1 fL (ref 7.5–12.5)
Monocytes Relative: 14 %
NEUTROS ABS: 5808 {cells}/uL (ref 1500–7800)
Neutrophils Relative %: 66 %
Platelets: 220 10*3/uL (ref 140–400)
RBC: 5.03 MIL/uL (ref 4.20–5.80)
RDW: 13.7 % (ref 11.0–15.0)
WBC: 8.8 10*3/uL (ref 3.8–10.8)

## 2015-08-16 LAB — LIPID PANEL
CHOLESTEROL: 236 mg/dL — AB (ref 125–200)
HDL: 34 mg/dL — ABNORMAL LOW (ref >=40)
LDL CALC: 165 mg/dL — AB (ref <130)
TRIGLYCERIDES: 186 mg/dL — AB (ref <150)
Total CHOL/HDL Ratio: 6.9 Ratio — ABNORMAL HIGH (ref <=5.0)
VLDL: 37 mg/dL — ABNORMAL HIGH (ref <30)

## 2015-08-16 LAB — COMPREHENSIVE METABOLIC PANEL
ALBUMIN: 4.4 g/dL (ref 3.6–5.1)
ALK PHOS: 47 U/L (ref 40–115)
ALT: 9 U/L (ref 9–46)
AST: 13 U/L (ref 10–35)
BILIRUBIN TOTAL: 0.4 mg/dL (ref 0.2–1.2)
BUN: 18 mg/dL (ref 7–25)
CO2: 27 mmol/L (ref 20–31)
CREATININE: 1.03 mg/dL (ref 0.70–1.25)
Calcium: 10.5 mg/dL — ABNORMAL HIGH (ref 8.6–10.3)
Chloride: 103 mmol/L (ref 98–110)
Glucose, Bld: 79 mg/dL (ref 65–99)
Potassium: 5.4 mmol/L — ABNORMAL HIGH (ref 3.5–5.3)
SODIUM: 140 mmol/L (ref 135–146)
TOTAL PROTEIN: 7.3 g/dL (ref 6.1–8.1)

## 2015-08-16 MED ORDER — LOSARTAN POTASSIUM 100 MG PO TABS
100.0000 mg | ORAL_TABLET | Freq: Every day | ORAL | Status: DC
Start: 2015-08-16 — End: 2015-09-14

## 2015-08-16 NOTE — Patient Instructions (Signed)
Medication Instructions:  INCREASE Losartan to 100 mg once daily   Labwork: TODAY - CBC, complete metabolic panel, cholesterol  Your physician recommends that you return for lab work in: 3 weeks for basic metabolic panel   Testing/Procedures: Your physician has requested that you have an echocardiogram. Echocardiography is a painless test that uses sound waves to create images of your heart. It provides your doctor with information about the size and shape of your heart and how well your heart's chambers and valves are working. This procedure takes approximately one hour. There are no restrictions for this procedure.   Follow-Up: Your physician wants you to follow-up in: 6 months with Dr. Acie Fredrickson.  You will receive a reminder letter in the mail two months in advance. If you don't receive a letter, please call our office to schedule the follow-up appointment.   If you need a refill on your cardiac medications before your next appointment, please call your pharmacy.   Thank you for choosing [facility]! Christen Bame, RN [phone]
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
## 2015-09-14 ENCOUNTER — Other Ambulatory Visit: Payer: Self-pay

## 2015-09-14 ENCOUNTER — Other Ambulatory Visit (INDEPENDENT_AMBULATORY_CARE_PROVIDER_SITE_OTHER): Payer: Medicare HMO | Admitting: *Deleted

## 2015-09-14 ENCOUNTER — Ambulatory Visit (HOSPITAL_COMMUNITY): Payer: Medicare HMO | Attending: Cardiovascular Disease

## 2015-09-14 ENCOUNTER — Telehealth: Payer: Self-pay | Admitting: Nurse Practitioner

## 2015-09-14 DIAGNOSIS — I517 Cardiomegaly: Secondary | ICD-10-CM | POA: Insufficient documentation

## 2015-09-14 DIAGNOSIS — R5383 Other fatigue: Secondary | ICD-10-CM

## 2015-09-14 DIAGNOSIS — I2510 Atherosclerotic heart disease of native coronary artery without angina pectoris: Secondary | ICD-10-CM | POA: Diagnosis not present

## 2015-09-14 DIAGNOSIS — I340 Nonrheumatic mitral (valve) insufficiency: Secondary | ICD-10-CM | POA: Insufficient documentation

## 2015-09-14 DIAGNOSIS — I509 Heart failure, unspecified: Secondary | ICD-10-CM | POA: Diagnosis present

## 2015-09-14 DIAGNOSIS — I5022 Chronic systolic (congestive) heart failure: Secondary | ICD-10-CM | POA: Insufficient documentation

## 2015-09-14 LAB — ECHOCARDIOGRAM COMPLETE
CHL CUP MV DEC (S): 165
CHL CUP STROKE VOLUME: 74 mL
CHL CUP TV REG PEAK VELOCITY: 321 cm/s
E decel time: 165 msec
EERAT: 32.55
FS: 13 % — AB (ref 28–44)
IV/PV OW: 1.02
LA diam end sys: 51 mm
LA diam index: 2.45 cm/m2
LA vol: 98 mL
LASIZE: 51 mm
LAVOLA4C: 89 mL
LAVOLIN: 47.1 mL/m2
LDCA: 3.14 cm2
LV E/e'average: 32.55
LV PW d: 16.8 mm — AB (ref 0.6–1.1)
LV TDI E'LATERAL: 3.84
LV TDI E'MEDIAL: 4.72
LV sys vol: 120 mL — AB (ref 21–61)
LVDIAVOL: 194 mL — AB (ref 62–150)
LVDIAVOLIN: 93 mL/m2
LVEEMED: 32.55
LVELAT: 3.84 cm/s
LVOT VTI: 13.5 cm
LVOT diameter: 20 mm
LVOTPV: 61.7 cm/s
LVOTSV: 42 mL
LVSYSVOLIN: 58 mL/m2
MV VTI: 195 cm
MV pk A vel: 60.2 m/s
MV pk E vel: 125 m/s
MVPG: 6 mmHg
PISA EROA: 0.14 cm2
Simpson's disk: 38
TRMAXVEL: 321 cm/s

## 2015-09-14 LAB — BASIC METABOLIC PANEL
BUN: 20 mg/dL (ref 7–25)
CO2: 26 mmol/L (ref 20–31)
Calcium: 10.2 mg/dL (ref 8.6–10.3)
Chloride: 104 mmol/L (ref 98–110)
Creat: 1.07 mg/dL (ref 0.70–1.25)
Glucose, Bld: 90 mg/dL (ref 65–99)
POTASSIUM: 5.1 mmol/L (ref 3.5–5.3)
SODIUM: 138 mmol/L (ref 135–146)

## 2015-09-14 MED ORDER — LOSARTAN POTASSIUM 100 MG PO TABS: 100.0000 mg | ORAL_TABLET | Freq: Every day | ORAL | Status: AC

## 2015-09-14 NOTE — Telephone Encounter (Signed)
-----   Message from Thayer Headings, MD sent at 09/14/2015  4:15 PM EDT ----- EF is low again, 20-25% We need to consider Entresto instead of the Losartan Lets work him in July ?

## 2015-09-14 NOTE — Telephone Encounter (Signed)
Reviewed echo and bmet results reviewed with patient.  He states he has been taking losartan 100 mg every other day because it made him hurt.  He states he has stopped doxazosin because it made him hurt really bad.  I advised him to continue taking the losartan every other day until we see him in the office.  I scheduled him to see Dr. Acie Fredrickson on 6/27.  He verbalized understanding and agreement with plan of care.

## 2015-09-15 ENCOUNTER — Telehealth: Payer: Self-pay | Admitting: Cardiovascular Disease

## 2015-09-15 NOTE — Telephone Encounter (Signed)
He does not necessarily need to come if he will take his meds.  If he is unable to tolerate it, we should switch to another ARB ( ? Valsartan ) to see if he tolerates that better.  If we make a medication switch, we should see him to reevaluate

## 2015-09-15 NOTE — Telephone Encounter (Signed)
New message     The pt wants to speak with a nurse, no other information provided.

## 2015-09-15 NOTE — Telephone Encounter (Signed)
Spoke with patient who states he called his insurance company about the cost of entresto.  He states it will cost him >$100 per month and he does not want to switch from losartan to entresto.  I advised him that he should try to take the losartan 100 mg daily instead of every other day to help improve his heart function.  He states he will work towards taking it everyday.  He asked if he needs appointment on June 27 since he is not willing to start entresto.  I advised him that I will ask Dr. Acie Fredrickson and call him back about appointment.  He verbalized understanding and agreement.

## 2015-09-18 NOTE — Telephone Encounter (Signed)
Spoke with patient's wife and advised her that no appointment is needed with Dr. Acie Fredrickson right away but that patient needs to take losartan daily.  I advised her that patient can call me if he has any questions or concerns or if we need to try a different medication.  She verbalized understanding and agreement.

## 2015-09-19 ENCOUNTER — Telehealth: Payer: Self-pay | Admitting: Cardiology

## 2015-09-19 ENCOUNTER — Ambulatory Visit (INDEPENDENT_AMBULATORY_CARE_PROVIDER_SITE_OTHER): Payer: Medicare HMO | Admitting: *Deleted

## 2015-09-19 DIAGNOSIS — Z950 Presence of cardiac pacemaker: Secondary | ICD-10-CM | POA: Diagnosis not present

## 2015-09-19 DIAGNOSIS — I5022 Chronic systolic (congestive) heart failure: Secondary | ICD-10-CM

## 2015-09-19 NOTE — Progress Notes (Signed)
Remote pacemaker transmission.   

## 2015-09-19 NOTE — Telephone Encounter (Signed)
Spoke with pt and reminded pt of remote transmission that is due today. Pt verbalized understanding.   

## 2015-09-20 LAB — CUP PACEART REMOTE DEVICE CHECK
Battery Remaining Longevity: 49 mo
Battery Voltage: 2.99 V
Brady Statistic RA Percent Paced: 13.16 %
Date Time Interrogation Session: 20170620222031
Implantable Lead Implant Date: 20131125
Implantable Lead Location: 753858
Implantable Lead Location: 753859
Implantable Lead Location: 753860
Implantable Lead Model: 4194
Implantable Lead Model: 5076
Lead Channel Impedance Value: 418 Ohm
Lead Channel Impedance Value: 570 Ohm
Lead Channel Impedance Value: 608 Ohm
Lead Channel Impedance Value: 874 Ohm
Lead Channel Pacing Threshold Amplitude: 0.625 V
Lead Channel Sensing Intrinsic Amplitude: 11.625 mV
Lead Channel Setting Pacing Amplitude: 2 V
Lead Channel Setting Pacing Pulse Width: 0.8 ms
Lead Channel Setting Sensing Sensitivity: 4 mV
MDC IDC LEAD IMPLANT DT: 20131125
MDC IDC LEAD IMPLANT DT: 20131125
MDC IDC MSMT LEADCHNL LV IMPEDANCE VALUE: 1026 Ohm
MDC IDC MSMT LEADCHNL LV IMPEDANCE VALUE: 1102 Ohm
MDC IDC MSMT LEADCHNL LV IMPEDANCE VALUE: 380 Ohm
MDC IDC MSMT LEADCHNL LV IMPEDANCE VALUE: 608 Ohm
MDC IDC MSMT LEADCHNL RA IMPEDANCE VALUE: 418 Ohm
MDC IDC MSMT LEADCHNL RA PACING THRESHOLD AMPLITUDE: 1 V
MDC IDC MSMT LEADCHNL RA PACING THRESHOLD PULSEWIDTH: 0.4 ms
MDC IDC MSMT LEADCHNL RA SENSING INTR AMPL: 2.75 mV
MDC IDC MSMT LEADCHNL RA SENSING INTR AMPL: 2.75 mV
MDC IDC MSMT LEADCHNL RV PACING THRESHOLD PULSEWIDTH: 0.4 ms
MDC IDC MSMT LEADCHNL RV SENSING INTR AMPL: 11.625 mV
MDC IDC SET LEADCHNL LV PACING AMPLITUDE: 2.25 V
MDC IDC SET LEADCHNL RA PACING AMPLITUDE: 2 V
MDC IDC SET LEADCHNL RV PACING PULSEWIDTH: 0.4 ms
MDC IDC STAT BRADY AP VP PERCENT: 13.11 %
MDC IDC STAT BRADY AP VS PERCENT: 0.05 %
MDC IDC STAT BRADY AS VP PERCENT: 86.57 %
MDC IDC STAT BRADY AS VS PERCENT: 0.28 %
MDC IDC STAT BRADY RV PERCENT PACED: 99.68 %

## 2015-09-22 ENCOUNTER — Encounter: Payer: Self-pay | Admitting: Cardiology

## 2015-09-26 ENCOUNTER — Ambulatory Visit: Payer: Medicare HMO | Admitting: Cardiovascular Disease

## 2015-10-09 ENCOUNTER — Other Ambulatory Visit: Payer: Self-pay | Admitting: Cardiovascular Disease

## 2015-11-25 ENCOUNTER — Encounter: Payer: Self-pay | Admitting: Internal Medicine

## 2015-11-27 ENCOUNTER — Telehealth: Payer: Self-pay | Admitting: *Deleted

## 2015-11-27 NOTE — Telephone Encounter (Signed)
Spoke with patient regarding billing question (see MyChart encounter).  Patient is agreeable to keeping 12/19/15 remote appointment.  He is appreciative of call and denies additional questions or concerns at this time.

## 2015-12-19 ENCOUNTER — Telehealth: Payer: Self-pay | Admitting: Cardiology

## 2015-12-19 ENCOUNTER — Encounter: Payer: Medicare HMO | Admitting: *Deleted

## 2015-12-19 NOTE — Telephone Encounter (Signed)
Spoke with pt and reminded pt of remote transmission that is due today. Pt verbalized understanding.   

## 2015-12-22 ENCOUNTER — Encounter: Payer: Self-pay | Admitting: Cardiology

## 2015-12-22 ENCOUNTER — Ambulatory Visit (INDEPENDENT_AMBULATORY_CARE_PROVIDER_SITE_OTHER): Payer: Medicare HMO | Admitting: *Deleted

## 2015-12-22 DIAGNOSIS — Z950 Presence of cardiac pacemaker: Secondary | ICD-10-CM

## 2015-12-22 NOTE — Progress Notes (Signed)
Letter  

## 2015-12-25 ENCOUNTER — Encounter: Payer: Self-pay | Admitting: Internal Medicine

## 2015-12-26 ENCOUNTER — Encounter: Payer: Self-pay | Admitting: Cardiology

## 2015-12-26 NOTE — Progress Notes (Signed)
Remote pacemaker transmission.   

## 2016-01-11 ENCOUNTER — Encounter: Payer: Self-pay | Admitting: Internal Medicine

## 2016-01-11 LAB — CUP PACEART REMOTE DEVICE CHECK
Battery Remaining Longevity: 43 mo
Battery Voltage: 2.98 V
Brady Statistic AS VP Percent: 82.4 %
Brady Statistic RA Percent Paced: 17.42 %
Implantable Lead Implant Date: 20131125
Implantable Lead Implant Date: 20131125
Implantable Lead Location: 753858
Implantable Lead Location: 753859
Implantable Lead Model: 5076
Implantable Lead Model: 5076
Lead Channel Impedance Value: 399 Ohm
Lead Channel Impedance Value: 551 Ohm
Lead Channel Impedance Value: 912 Ohm
Lead Channel Pacing Threshold Pulse Width: 0.4 ms
Lead Channel Pacing Threshold Pulse Width: 0.4 ms
Lead Channel Sensing Intrinsic Amplitude: 11 mV
Lead Channel Sensing Intrinsic Amplitude: 2.5 mV
Lead Channel Setting Pacing Amplitude: 2 V
Lead Channel Setting Pacing Pulse Width: 0.4 ms
Lead Channel Setting Pacing Pulse Width: 0.8 ms
Lead Channel Setting Sensing Sensitivity: 4 mV
MDC IDC LEAD IMPLANT DT: 20131125
MDC IDC LEAD LOCATION: 753860
MDC IDC LEAD MODEL: 4194
MDC IDC MSMT LEADCHNL LV IMPEDANCE VALUE: 380 Ohm
MDC IDC MSMT LEADCHNL LV IMPEDANCE VALUE: 779 Ohm
MDC IDC MSMT LEADCHNL LV IMPEDANCE VALUE: 950 Ohm
MDC IDC MSMT LEADCHNL LV PACING THRESHOLD AMPLITUDE: 1.25 V
MDC IDC MSMT LEADCHNL LV PACING THRESHOLD PULSEWIDTH: 0.8 ms
MDC IDC MSMT LEADCHNL RA IMPEDANCE VALUE: 551 Ohm
MDC IDC MSMT LEADCHNL RA PACING THRESHOLD AMPLITUDE: 0.875 V
MDC IDC MSMT LEADCHNL RA SENSING INTR AMPL: 2.5 mV
MDC IDC MSMT LEADCHNL RV IMPEDANCE VALUE: 399 Ohm
MDC IDC MSMT LEADCHNL RV IMPEDANCE VALUE: 551 Ohm
MDC IDC MSMT LEADCHNL RV PACING THRESHOLD AMPLITUDE: 0.625 V
MDC IDC MSMT LEADCHNL RV SENSING INTR AMPL: 11 mV
MDC IDC SESS DTM: 20170923004206
MDC IDC SET LEADCHNL LV PACING AMPLITUDE: 2.25 V
MDC IDC SET LEADCHNL RV PACING AMPLITUDE: 2 V
MDC IDC STAT BRADY AP VP PERCENT: 17.36 %
MDC IDC STAT BRADY AP VS PERCENT: 0.05 %
MDC IDC STAT BRADY AS VS PERCENT: 0.19 %
MDC IDC STAT BRADY RV PERCENT PACED: 99.76 %

## 2016-02-12 ENCOUNTER — Ambulatory Visit (INDEPENDENT_AMBULATORY_CARE_PROVIDER_SITE_OTHER): Payer: Medicare HMO | Admitting: Cardiovascular Disease

## 2016-02-12 ENCOUNTER — Encounter: Payer: Self-pay | Admitting: Cardiovascular Disease

## 2016-02-12 VITALS — BP 158/88 | HR 65 | Ht 70.0 in | Wt 198.4 lb

## 2016-02-12 DIAGNOSIS — I5022 Chronic systolic (congestive) heart failure: Secondary | ICD-10-CM

## 2016-02-12 DIAGNOSIS — I2510 Atherosclerotic heart disease of native coronary artery without angina pectoris: Secondary | ICD-10-CM

## 2016-02-12 MED ORDER — CARVEDILOL 25 MG PO TABS: 25.0000 mg | ORAL_TABLET | Freq: Two times a day (BID) | ORAL | 3 refills | Status: DC

## 2016-02-12 MED ORDER — FUROSEMIDE 40 MG PO TABS: 40.0000 mg | ORAL_TABLET | Freq: Every day | ORAL | 3 refills | Status: AC

## 2016-02-12 NOTE — Patient Instructions (Signed)
Your physician has recommended you make the following change in your medication:  1) stop metoprolol 2.) start carvedilol 25 mg two times a day 3.) start furosemide 40 mg once a day  Your physician recommends that you return for lab work in: 3 months (BMET) Your physician recommends that you schedule a follow-up appointment in: 3 months with Dr. Acie Fredrickson.
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
## 2016-03-14 ENCOUNTER — Encounter: Payer: Self-pay | Admitting: Internal Medicine

## 2016-03-28 ENCOUNTER — Encounter: Payer: Self-pay | Admitting: Internal Medicine

## 2016-03-28 ENCOUNTER — Ambulatory Visit (INDEPENDENT_AMBULATORY_CARE_PROVIDER_SITE_OTHER): Payer: Medicare HMO | Admitting: Internal Medicine

## 2016-03-28 DIAGNOSIS — I472 Ventricular tachycardia, unspecified: Secondary | ICD-10-CM

## 2016-03-28 DIAGNOSIS — I5022 Chronic systolic (congestive) heart failure: Secondary | ICD-10-CM | POA: Diagnosis not present

## 2016-03-28 LAB — CUP PACEART INCLINIC DEVICE CHECK
Battery Remaining Longevity: 47 mo
Brady Statistic AP VP Percent: 20.54 %
Brady Statistic AS VS Percent: 0.18 %
Brady Statistic RV Percent Paced: 98.61 %
Date Time Interrogation Session: 20171228145814
Implantable Lead Location: 753860
Implantable Lead Model: 4194
Implantable Lead Model: 5076
Lead Channel Impedance Value: 342 Ohm
Lead Channel Impedance Value: 380 Ohm
Lead Channel Impedance Value: 456 Ohm
Lead Channel Impedance Value: 817 Ohm
Lead Channel Pacing Threshold Amplitude: 0.5 V
Lead Channel Pacing Threshold Amplitude: 0.75 V
Lead Channel Pacing Threshold Amplitude: 1.25 V
Lead Channel Pacing Threshold Pulse Width: 0.4 ms
Lead Channel Sensing Intrinsic Amplitude: 2.25 mV
Lead Channel Setting Pacing Amplitude: 2 V
Lead Channel Setting Pacing Amplitude: 2 V
Lead Channel Setting Pacing Pulse Width: 0.4 ms
Lead Channel Setting Pacing Pulse Width: 0.8 ms
Lead Channel Setting Sensing Sensitivity: 4 mV
MDC IDC LEAD IMPLANT DT: 20131125
MDC IDC LEAD IMPLANT DT: 20131125
MDC IDC LEAD IMPLANT DT: 20131125
MDC IDC LEAD LOCATION: 753858
MDC IDC LEAD LOCATION: 753859
MDC IDC MSMT BATTERY VOLTAGE: 2.98 V
MDC IDC MSMT LEADCHNL LV IMPEDANCE VALUE: 456 Ohm
MDC IDC MSMT LEADCHNL LV IMPEDANCE VALUE: 703 Ohm
MDC IDC MSMT LEADCHNL LV IMPEDANCE VALUE: 836 Ohm
MDC IDC MSMT LEADCHNL LV PACING THRESHOLD PULSEWIDTH: 0.8 ms
MDC IDC MSMT LEADCHNL RA IMPEDANCE VALUE: 361 Ohm
MDC IDC MSMT LEADCHNL RA IMPEDANCE VALUE: 494 Ohm
MDC IDC MSMT LEADCHNL RA SENSING INTR AMPL: 2.375 mV
MDC IDC MSMT LEADCHNL RV PACING THRESHOLD PULSEWIDTH: 0.4 ms
MDC IDC MSMT LEADCHNL RV SENSING INTR AMPL: 11.125 mV
MDC IDC MSMT LEADCHNL RV SENSING INTR AMPL: 9 mV
MDC IDC PG IMPLANT DT: 20131125
MDC IDC SET LEADCHNL LV PACING AMPLITUDE: 2.25 V
MDC IDC STAT BRADY AP VS PERCENT: 0.05 %
MDC IDC STAT BRADY AS VP PERCENT: 79.23 %
MDC IDC STAT BRADY RA PERCENT PACED: 19.94 %

## 2016-03-28 NOTE — Patient Instructions (Addendum)
Medication Instructions:  1) Take Lasix (2 tablets) 80 mg for 3 days, then start taking 40 mg once daily   Labwork: None Ordered   Testing/Procedures: None Ordered    Follow-Up: Your physician wants you to follow-up in: 1 year with Dr. Lovena Le. You will receive a reminder letter in the mail two months in advance. If you don't receive a letter, please call our office to schedule the follow-up appointment.   Remote monitoring is used to monitor your Pacemaker or ICD from home. This monitoring reduces the number of office visits required to check your device to one time per year. It allows [place] to keep an eye on the functioning of your device to ensure it is working properly. You are scheduled for a device check from home on 06/27/16. You may send your transmission at any time that day. If you have a wireless device, the transmission will be sent automatically. After your physician reviews your transmission, you will receive a postcard with your next transmission date.    Any Other Special Instructions Will Be Listed Below (If Applicable). ICM nurse monthly f/u    If you need a refill on your cardiac medications before your next appointment, please call your pharmacy.
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
## 2016-04-12 ENCOUNTER — Telehealth: Payer: Self-pay | Admitting: Cardiovascular Disease

## 2016-04-12 MED ORDER — CARVEDILOL 25 MG PO TABS: 25.0000 mg | ORAL_TABLET | Freq: Two times a day (BID) | ORAL | 3 refills | Status: AC

## 2016-04-12 NOTE — Telephone Encounter (Signed)
New message     *STAT* If patient is at the pharmacy, call can be transferred to refill team.   1. Which medications need to be refilled? (please list name of each medication and dose if known) carvedilol (COREG) 25 MG tablet  2. Which pharmacy/location (including street and city if local pharmacy) is medication to be sent to? [facility]   3. Do they need a 30 day or 90 day supply?  90 days supply

## 2016-04-22 ENCOUNTER — Encounter: Payer: Self-pay | Admitting: Cardiovascular Disease

## 2016-05-03 ENCOUNTER — Telehealth: Payer: Self-pay

## 2016-05-03 NOTE — Telephone Encounter (Signed)
Referred to [facility] clinic by [facility], device tech/Dr Lovena Le.   Attempted call to patient for ICM intro and left message fore return call.   ICM remote transmission scheduled for 05/09/2016 with intro.

## 2016-05-10 NOTE — Telephone Encounter (Signed)
I received a phone call from [name]'s wife, [name], today letting me know that he died on 05-14-16 after being hospitalized in [place] with pneumonia. He transferred to hospice and died peacefully with his family at the bedside.

## 2016-05-16 ENCOUNTER — Encounter: Payer: Self-pay | Admitting: Internal Medicine

## 2016-05-16 NOTE — Progress Notes (Signed)
Unable to reach patient for ICM intro and rescheduled for 05/20/2016.

## 2016-05-27 ENCOUNTER — Ambulatory Visit: Payer: Medicare HMO | Admitting: Cardiovascular Disease

## 2016-05-27 ENCOUNTER — Other Ambulatory Visit: Payer: Medicare HMO

## 2016-05-30 DEATH — deceased
